# Patient Record
Sex: Male | Born: 1940 | Race: White | Hispanic: No | Marital: Married | Smoking: Former smoker
Health system: Southern US, Community
[De-identification: ages and names within clinical notes are randomized; demographics above are authoritative.]

## PROBLEM LIST (undated history)

## (undated) DIAGNOSIS — I10 Essential (primary) hypertension: Secondary | ICD-10-CM

## (undated) DIAGNOSIS — Z8619 Personal history of other infectious and parasitic diseases: Secondary | ICD-10-CM

## (undated) DIAGNOSIS — J449 Chronic obstructive pulmonary disease, unspecified: Secondary | ICD-10-CM

## (undated) DIAGNOSIS — J189 Pneumonia, unspecified organism: Secondary | ICD-10-CM

## (undated) DIAGNOSIS — E039 Hypothyroidism, unspecified: Secondary | ICD-10-CM

## (undated) DIAGNOSIS — M1711 Unilateral primary osteoarthritis, right knee: Secondary | ICD-10-CM

## (undated) DIAGNOSIS — J439 Emphysema, unspecified: Secondary | ICD-10-CM

## (undated) DIAGNOSIS — K759 Inflammatory liver disease, unspecified: Secondary | ICD-10-CM

## (undated) DIAGNOSIS — N419 Inflammatory disease of prostate, unspecified: Secondary | ICD-10-CM

## (undated) DIAGNOSIS — T7840XA Allergy, unspecified, initial encounter: Secondary | ICD-10-CM

## (undated) DIAGNOSIS — R55 Syncope and collapse: Secondary | ICD-10-CM

## (undated) DIAGNOSIS — I444 Left anterior fascicular block: Secondary | ICD-10-CM

## (undated) DIAGNOSIS — H43819 Vitreous degeneration, unspecified eye: Secondary | ICD-10-CM

## (undated) DIAGNOSIS — G250 Essential tremor: Secondary | ICD-10-CM

## (undated) DIAGNOSIS — F419 Anxiety disorder, unspecified: Secondary | ICD-10-CM

## (undated) DIAGNOSIS — H269 Unspecified cataract: Secondary | ICD-10-CM

## (undated) HISTORY — DX: Inflammatory disease of prostate, unspecified: N41.9

## (undated) HISTORY — DX: Unspecified cataract: H26.9

## (undated) HISTORY — DX: Allergy, unspecified, initial encounter: T78.40XA

## (undated) HISTORY — DX: Left anterior fascicular block: I44.4

## (undated) HISTORY — DX: Personal history of other infectious and parasitic diseases: Z86.19

## (undated) HISTORY — DX: Hypothyroidism, unspecified: E03.9

## (undated) HISTORY — DX: Unilateral primary osteoarthritis, right knee: M17.11

## (undated) HISTORY — DX: Chronic obstructive pulmonary disease, unspecified: J44.9

## (undated) HISTORY — PX: OTHER SURGICAL HISTORY: SHX169

## (undated) HISTORY — PX: COLONOSCOPY: SHX174

## (undated) HISTORY — DX: Syncope and collapse: R55

## (undated) HISTORY — DX: Emphysema, unspecified: J43.9

## (undated) HISTORY — DX: Vitreous degeneration, unspecified eye: H43.819

---

## 1965-06-25 DIAGNOSIS — Z8619 Personal history of other infectious and parasitic diseases: Secondary | ICD-10-CM

## 1965-06-25 HISTORY — DX: Personal history of other infectious and parasitic diseases: Z86.19

## 1998-05-10 ENCOUNTER — Ambulatory Visit (HOSPITAL_BASED_OUTPATIENT_CLINIC_OR_DEPARTMENT_OTHER): Admission: RE | Admit: 1998-05-10 | Discharge: 1998-05-10 | Payer: Self-pay | Admitting: Orthopedic Surgery

## 1998-09-15 ENCOUNTER — Encounter: Payer: Self-pay | Admitting: Internal Medicine

## 1999-08-11 ENCOUNTER — Encounter: Admission: RE | Admit: 1999-08-11 | Discharge: 1999-08-11 | Payer: Self-pay | Admitting: *Deleted

## 1999-08-11 ENCOUNTER — Encounter: Payer: Self-pay | Admitting: *Deleted

## 2003-06-26 HISTORY — PX: INGUINAL HERNIA REPAIR: SHX194

## 2003-11-12 ENCOUNTER — Encounter: Admission: RE | Admit: 2003-11-12 | Discharge: 2003-11-12 | Payer: Self-pay | Admitting: Surgery

## 2003-11-12 ENCOUNTER — Encounter: Payer: Self-pay | Admitting: Internal Medicine

## 2003-11-16 ENCOUNTER — Ambulatory Visit (HOSPITAL_BASED_OUTPATIENT_CLINIC_OR_DEPARTMENT_OTHER): Admission: RE | Admit: 2003-11-16 | Discharge: 2003-11-16 | Payer: Self-pay | Admitting: Surgery

## 2003-11-16 ENCOUNTER — Encounter (INDEPENDENT_AMBULATORY_CARE_PROVIDER_SITE_OTHER): Payer: Self-pay | Admitting: *Deleted

## 2003-11-16 ENCOUNTER — Ambulatory Visit (HOSPITAL_COMMUNITY): Admission: RE | Admit: 2003-11-16 | Discharge: 2003-11-16 | Payer: Self-pay | Admitting: Surgery

## 2004-06-02 ENCOUNTER — Ambulatory Visit (HOSPITAL_COMMUNITY): Admission: RE | Admit: 2004-06-02 | Discharge: 2004-06-02 | Payer: Self-pay | Admitting: Surgery

## 2004-06-02 ENCOUNTER — Ambulatory Visit (HOSPITAL_BASED_OUTPATIENT_CLINIC_OR_DEPARTMENT_OTHER): Admission: RE | Admit: 2004-06-02 | Discharge: 2004-06-02 | Payer: Self-pay | Admitting: Surgery

## 2004-11-09 ENCOUNTER — Ambulatory Visit: Payer: Self-pay | Admitting: Gastroenterology

## 2004-11-30 ENCOUNTER — Ambulatory Visit: Payer: Self-pay | Admitting: Gastroenterology

## 2006-06-25 DIAGNOSIS — I444 Left anterior fascicular block: Secondary | ICD-10-CM

## 2006-06-25 HISTORY — DX: Left anterior fascicular block: I44.4

## 2007-07-23 ENCOUNTER — Ambulatory Visit: Payer: Self-pay | Admitting: Internal Medicine

## 2007-07-23 DIAGNOSIS — R9389 Abnormal findings on diagnostic imaging of other specified body structures: Secondary | ICD-10-CM | POA: Insufficient documentation

## 2007-07-23 DIAGNOSIS — I4510 Unspecified right bundle-branch block: Secondary | ICD-10-CM

## 2007-07-23 DIAGNOSIS — I7300 Raynaud's syndrome without gangrene: Secondary | ICD-10-CM | POA: Insufficient documentation

## 2007-07-23 DIAGNOSIS — R930 Abnormal findings on diagnostic imaging of skull and head, not elsewhere classified: Secondary | ICD-10-CM

## 2007-07-23 DIAGNOSIS — M1990 Unspecified osteoarthritis, unspecified site: Secondary | ICD-10-CM | POA: Insufficient documentation

## 2007-07-23 DIAGNOSIS — R918 Other nonspecific abnormal finding of lung field: Secondary | ICD-10-CM | POA: Insufficient documentation

## 2007-07-23 DIAGNOSIS — R062 Wheezing: Secondary | ICD-10-CM

## 2007-07-24 ENCOUNTER — Ambulatory Visit: Payer: Self-pay | Admitting: Internal Medicine

## 2007-07-24 DIAGNOSIS — E039 Hypothyroidism, unspecified: Secondary | ICD-10-CM | POA: Insufficient documentation

## 2007-07-28 ENCOUNTER — Ambulatory Visit: Payer: Self-pay | Admitting: Internal Medicine

## 2007-08-01 ENCOUNTER — Telehealth: Payer: Self-pay | Admitting: Internal Medicine

## 2007-08-04 ENCOUNTER — Ambulatory Visit: Payer: Self-pay | Admitting: Internal Medicine

## 2007-08-04 DIAGNOSIS — J449 Chronic obstructive pulmonary disease, unspecified: Secondary | ICD-10-CM | POA: Insufficient documentation

## 2007-08-04 DIAGNOSIS — N281 Cyst of kidney, acquired: Secondary | ICD-10-CM | POA: Insufficient documentation

## 2007-08-04 LAB — CONVERTED CEMR LAB
ALT: 22 units/L (ref 0–53)
AST: 25 units/L (ref 0–37)
Albumin: 4.2 g/dL (ref 3.5–5.2)
Alkaline Phosphatase: 73 units/L (ref 39–117)
BUN: 15 mg/dL (ref 6–23)
Bacteria, UA: NEGATIVE
Basophils Absolute: 0 10*3/uL (ref 0.0–0.1)
Basophils Relative: 0.3 % (ref 0.0–1.0)
Bilirubin Urine: NEGATIVE
Bilirubin, Direct: 0.2 mg/dL (ref 0.0–0.3)
CO2: 31 meq/L (ref 19–32)
Calcium: 9.7 mg/dL (ref 8.4–10.5)
Chloride: 102 meq/L (ref 96–112)
Cholesterol: 131 mg/dL (ref 0–200)
Creatinine, Ser: 1 mg/dL (ref 0.4–1.5)
Crystals: NEGATIVE
Eosinophils Absolute: 0.3 10*3/uL (ref 0.0–0.6)
Eosinophils Relative: 3.4 % (ref 0.0–5.0)
GFR calc Af Amer: 96 mL/min
GFR calc non Af Amer: 80 mL/min
Glucose, Bld: 98 mg/dL (ref 70–99)
HCT: 41.5 % (ref 39.0–52.0)
HCV Ab: NEGATIVE
HDL: 49.4 mg/dL (ref >39.0)
Hemoglobin: 13.8 g/dL (ref 13.0–17.0)
Hep B Core Total Ab: POSITIVE — AB
Hep B S Ab: POSITIVE — AB
Hepatitis B Surface Ag: NEGATIVE
Ketones, ur: NEGATIVE mg/dL
LDL Cholesterol: 69 mg/dL (ref 0–99)
Leukocytes, UA: NEGATIVE
Lymphocytes Relative: 15.4 % (ref 12.0–46.0)
MCHC: 33.3 g/dL (ref 30.0–36.0)
MCV: 93.2 fL (ref 78.0–100.0)
Monocytes Absolute: 0.7 10*3/uL (ref 0.2–0.7)
Monocytes Relative: 7.1 % (ref 3.0–11.0)
Mucus, UA: NEGATIVE
Neutro Abs: 7.5 10*3/uL (ref 1.4–7.7)
Neutrophils Relative %: 73.8 % (ref 43.0–77.0)
Nitrite: NEGATIVE
PSA: 1.29 ng/mL (ref 0.10–4.00)
Platelets: 252 10*3/uL (ref 150–400)
Potassium: 3.7 meq/L (ref 3.5–5.1)
RBC: 4.45 M/uL (ref 4.22–5.81)
RDW: 12.1 % (ref 11.5–14.6)
Sodium: 138 meq/L (ref 135–145)
Specific Gravity, Urine: <=1.005 (ref 1.000–1.03)
Squamous Epithelial / LPF: NEGATIVE /lpf
TSH: 8.59 microintl units/mL — ABNORMAL HIGH (ref 0.35–5.50)
Total Bilirubin: 1.2 mg/dL (ref 0.3–1.2)
Total CHOL/HDL Ratio: 2.7
Total Protein, Urine: NEGATIVE mg/dL
Total Protein: 7.2 g/dL (ref 6.0–8.3)
Triglycerides: 63 mg/dL (ref 0–149)
Urine Glucose: NEGATIVE mg/dL
Urobilinogen, UA: 0.2 (ref 0.0–1.0)
VLDL: 13 mg/dL (ref 0–40)
WBC, UA: NONE SEEN cells/hpf
WBC: 10.1 10*3/uL (ref 4.5–10.5)
pH: 6.5 (ref 5.0–8.0)

## 2007-08-06 ENCOUNTER — Encounter: Payer: Self-pay | Admitting: Internal Medicine

## 2007-08-06 ENCOUNTER — Encounter (INDEPENDENT_AMBULATORY_CARE_PROVIDER_SITE_OTHER): Payer: Self-pay | Admitting: *Deleted

## 2007-09-04 ENCOUNTER — Telehealth: Payer: Self-pay | Admitting: Internal Medicine

## 2007-09-04 ENCOUNTER — Ambulatory Visit: Payer: Self-pay | Admitting: Internal Medicine

## 2007-09-05 ENCOUNTER — Encounter: Admission: RE | Admit: 2007-09-05 | Discharge: 2007-09-05 | Payer: Self-pay | Admitting: Internal Medicine

## 2007-09-16 ENCOUNTER — Ambulatory Visit: Payer: Self-pay | Admitting: Internal Medicine

## 2007-09-25 ENCOUNTER — Ambulatory Visit: Payer: Self-pay | Admitting: Internal Medicine

## 2007-10-02 ENCOUNTER — Ambulatory Visit: Payer: Self-pay | Admitting: Internal Medicine

## 2007-10-02 DIAGNOSIS — R634 Abnormal weight loss: Secondary | ICD-10-CM | POA: Insufficient documentation

## 2007-10-02 DIAGNOSIS — F17200 Nicotine dependence, unspecified, uncomplicated: Secondary | ICD-10-CM

## 2007-11-12 ENCOUNTER — Ambulatory Visit: Payer: Self-pay | Admitting: Internal Medicine

## 2007-12-02 ENCOUNTER — Telehealth: Payer: Self-pay | Admitting: Internal Medicine

## 2007-12-05 ENCOUNTER — Ambulatory Visit: Payer: Self-pay | Admitting: Internal Medicine

## 2007-12-09 ENCOUNTER — Ambulatory Visit: Payer: Self-pay | Admitting: Cardiology

## 2007-12-16 ENCOUNTER — Telehealth (INDEPENDENT_AMBULATORY_CARE_PROVIDER_SITE_OTHER): Payer: Self-pay | Admitting: *Deleted

## 2008-01-12 ENCOUNTER — Ambulatory Visit: Payer: Self-pay | Admitting: Internal Medicine

## 2008-02-03 ENCOUNTER — Ambulatory Visit: Payer: Self-pay | Admitting: Internal Medicine

## 2008-02-03 DIAGNOSIS — J019 Acute sinusitis, unspecified: Secondary | ICD-10-CM | POA: Insufficient documentation

## 2008-03-17 ENCOUNTER — Telehealth: Payer: Self-pay | Admitting: Internal Medicine

## 2008-04-07 ENCOUNTER — Ambulatory Visit: Payer: Self-pay | Admitting: Internal Medicine

## 2008-04-07 LAB — CONVERTED CEMR LAB
INR: 1 (ref 0.8–1.0)
Prothrombin Time: 11.8 s (ref 10.9–13.3)
aPTT: 31.9 s — ABNORMAL HIGH (ref 21.7–29.8)

## 2008-04-13 ENCOUNTER — Telehealth (INDEPENDENT_AMBULATORY_CARE_PROVIDER_SITE_OTHER): Payer: Self-pay | Admitting: *Deleted

## 2008-04-14 ENCOUNTER — Ambulatory Visit: Payer: Self-pay | Admitting: Internal Medicine

## 2008-04-14 ENCOUNTER — Encounter: Payer: Self-pay | Admitting: Internal Medicine

## 2008-04-14 ENCOUNTER — Ambulatory Visit: Admission: RE | Admit: 2008-04-14 | Discharge: 2008-04-14 | Payer: Self-pay | Admitting: Internal Medicine

## 2008-04-14 HISTORY — PX: BRONCHOSCOPY: SUR163

## 2008-04-15 ENCOUNTER — Telehealth (INDEPENDENT_AMBULATORY_CARE_PROVIDER_SITE_OTHER): Payer: Self-pay | Admitting: *Deleted

## 2008-05-06 ENCOUNTER — Ambulatory Visit: Payer: Self-pay | Admitting: Internal Medicine

## 2008-05-06 ENCOUNTER — Telehealth (INDEPENDENT_AMBULATORY_CARE_PROVIDER_SITE_OTHER): Payer: Self-pay | Admitting: *Deleted

## 2008-05-07 ENCOUNTER — Ambulatory Visit: Payer: Self-pay | Admitting: Internal Medicine

## 2008-06-29 ENCOUNTER — Encounter: Payer: Self-pay | Admitting: Internal Medicine

## 2008-07-05 ENCOUNTER — Ambulatory Visit: Payer: Self-pay | Admitting: Internal Medicine

## 2008-07-06 LAB — CONVERTED CEMR LAB
BUN: 21 mg/dL (ref 6–23)
Basophils Absolute: 0 10*3/uL (ref 0.0–0.1)
Basophils Relative: 0.1 % (ref 0.0–3.0)
CO2: 27 meq/L (ref 19–32)
Chloride: 100 meq/L (ref 96–112)
Creatinine, Ser: 0.9 mg/dL (ref 0.4–1.5)
Eosinophils Relative: 7 % — ABNORMAL HIGH (ref 0.0–5.0)
Glucose, Bld: 144 mg/dL — ABNORMAL HIGH (ref 70–99)
Hemoglobin: 13.6 g/dL (ref 13.0–17.0)
Lymphocytes Relative: 19 % (ref 12.0–46.0)
Monocytes Relative: 5.5 % (ref 3.0–12.0)
Neutro Abs: 5.3 10*3/uL (ref 1.4–7.7)
Neutrophils Relative %: 68.4 % (ref 43.0–77.0)
RBC: 4.2 M/uL — ABNORMAL LOW (ref 4.22–5.81)
TSH: 5.12 microintl units/mL (ref 0.35–5.50)
WBC: 7.7 10*3/uL (ref 4.5–10.5)

## 2008-07-13 ENCOUNTER — Ambulatory Visit: Payer: Self-pay | Admitting: Internal Medicine

## 2008-07-13 DIAGNOSIS — J3089 Other allergic rhinitis: Secondary | ICD-10-CM

## 2008-07-13 DIAGNOSIS — J302 Other seasonal allergic rhinitis: Secondary | ICD-10-CM

## 2008-07-13 DIAGNOSIS — R7309 Other abnormal glucose: Secondary | ICD-10-CM | POA: Insufficient documentation

## 2008-08-03 ENCOUNTER — Ambulatory Visit: Payer: Self-pay | Admitting: Internal Medicine

## 2008-11-02 ENCOUNTER — Ambulatory Visit: Payer: Self-pay | Admitting: Internal Medicine

## 2008-11-02 LAB — CONVERTED CEMR LAB
CO2: 32 meq/L (ref 19–32)
Chloride: 106 meq/L (ref 96–112)
Creatinine, Ser: 0.8 mg/dL (ref 0.4–1.5)
Glucose, Bld: 100 mg/dL — ABNORMAL HIGH (ref 70–99)
Hgb A1c MFr Bld: 5.8 % (ref 4.6–6.5)

## 2008-11-09 ENCOUNTER — Ambulatory Visit: Payer: Self-pay | Admitting: Internal Medicine

## 2009-01-31 ENCOUNTER — Ambulatory Visit: Payer: Self-pay | Admitting: Internal Medicine

## 2009-02-07 ENCOUNTER — Ambulatory Visit: Payer: Self-pay | Admitting: Internal Medicine

## 2009-02-07 DIAGNOSIS — M79609 Pain in unspecified limb: Secondary | ICD-10-CM | POA: Insufficient documentation

## 2009-02-07 DIAGNOSIS — H539 Unspecified visual disturbance: Secondary | ICD-10-CM

## 2009-02-07 DIAGNOSIS — G250 Essential tremor: Secondary | ICD-10-CM | POA: Insufficient documentation

## 2009-02-07 DIAGNOSIS — G252 Other specified forms of tremor: Secondary | ICD-10-CM

## 2009-04-18 ENCOUNTER — Ambulatory Visit: Payer: Self-pay | Admitting: Internal Medicine

## 2009-05-02 ENCOUNTER — Ambulatory Visit: Payer: Self-pay | Admitting: Internal Medicine

## 2009-05-02 LAB — CONVERTED CEMR LAB
CO2: 31 meq/L (ref 19–32)
Calcium: 9.8 mg/dL (ref 8.4–10.5)
GFR calc non Af Amer: 89.39 mL/min (ref >60)
Potassium: 4.4 meq/L (ref 3.5–5.1)
Sodium: 142 meq/L (ref 135–145)
Vitamin B-12: 452 pg/mL (ref 211–911)

## 2009-05-09 ENCOUNTER — Ambulatory Visit: Payer: Self-pay | Admitting: Internal Medicine

## 2009-06-24 ENCOUNTER — Ambulatory Visit: Payer: Self-pay | Admitting: Family Medicine

## 2009-06-24 DIAGNOSIS — J029 Acute pharyngitis, unspecified: Secondary | ICD-10-CM | POA: Insufficient documentation

## 2009-06-25 DIAGNOSIS — H43819 Vitreous degeneration, unspecified eye: Secondary | ICD-10-CM

## 2009-06-25 HISTORY — DX: Vitreous degeneration, unspecified eye: H43.819

## 2009-08-01 ENCOUNTER — Ambulatory Visit: Payer: Self-pay | Admitting: Internal Medicine

## 2009-08-12 ENCOUNTER — Telehealth: Payer: Self-pay | Admitting: Internal Medicine

## 2009-10-25 ENCOUNTER — Ambulatory Visit: Payer: Self-pay | Admitting: Internal Medicine

## 2009-10-25 LAB — CONVERTED CEMR LAB
Bilirubin Urine: NEGATIVE
Bilirubin, Direct: 0.2 mg/dL (ref 0.0–0.3)
Cholesterol: 124 mg/dL (ref 0–200)
Eosinophils Absolute: 0.3 10*3/uL (ref 0.0–0.7)
GFR calc non Af Amer: 102.25 mL/min (ref >60)
Glucose, Bld: 95 mg/dL (ref 70–99)
HDL: 41.7 mg/dL (ref >39.00)
Leukocytes, UA: NEGATIVE
MCHC: 34 g/dL (ref 30.0–36.0)
MCV: 94.3 fL (ref 78.0–100.0)
Monocytes Absolute: 0.4 10*3/uL (ref 0.1–1.0)
Neutrophils Relative %: 59.6 % (ref 43.0–77.0)
Nitrite: NEGATIVE
PSA: 1.44 ng/mL (ref 0.10–4.00)
Platelets: 238 10*3/uL (ref 150.0–400.0)
Potassium: 3.9 meq/L (ref 3.5–5.1)
Sodium: 139 meq/L (ref 135–145)
Specific Gravity, Urine: 1.025 (ref 1.000–1.030)
Total Bilirubin: 0.9 mg/dL (ref 0.3–1.2)
Total Protein: 6.7 g/dL (ref 6.0–8.3)
VLDL: 9.4 mg/dL (ref 0.0–40.0)
pH: 6 (ref 5.0–8.0)

## 2009-10-31 ENCOUNTER — Ambulatory Visit: Payer: Self-pay | Admitting: Internal Medicine

## 2009-11-25 ENCOUNTER — Encounter (INDEPENDENT_AMBULATORY_CARE_PROVIDER_SITE_OTHER): Payer: Self-pay | Admitting: *Deleted

## 2010-01-10 ENCOUNTER — Ambulatory Visit: Payer: Self-pay | Admitting: Internal Medicine

## 2010-01-30 ENCOUNTER — Ambulatory Visit: Payer: Self-pay | Admitting: Internal Medicine

## 2010-01-30 DIAGNOSIS — R918 Other nonspecific abnormal finding of lung field: Secondary | ICD-10-CM | POA: Insufficient documentation

## 2010-01-30 DIAGNOSIS — R911 Solitary pulmonary nodule: Secondary | ICD-10-CM

## 2010-04-04 ENCOUNTER — Ambulatory Visit: Payer: Self-pay | Admitting: Internal Medicine

## 2010-04-28 ENCOUNTER — Ambulatory Visit: Payer: Self-pay | Admitting: Internal Medicine

## 2010-04-28 ENCOUNTER — Encounter (INDEPENDENT_AMBULATORY_CARE_PROVIDER_SITE_OTHER): Payer: Self-pay | Admitting: *Deleted

## 2010-04-28 LAB — CONVERTED CEMR LAB
CO2: 28 meq/L (ref 19–32)
Calcium: 9.5 mg/dL (ref 8.4–10.5)
Chloride: 104 meq/L (ref 96–112)
Glucose, Bld: 82 mg/dL (ref 70–99)
Sodium: 138 meq/L (ref 135–145)

## 2010-05-05 ENCOUNTER — Ambulatory Visit: Payer: Self-pay | Admitting: Internal Medicine

## 2010-05-05 DIAGNOSIS — L570 Actinic keratosis: Secondary | ICD-10-CM

## 2010-05-09 ENCOUNTER — Encounter (INDEPENDENT_AMBULATORY_CARE_PROVIDER_SITE_OTHER): Payer: Self-pay | Admitting: *Deleted

## 2010-05-11 ENCOUNTER — Ambulatory Visit: Payer: Self-pay | Admitting: Internal Medicine

## 2010-05-30 ENCOUNTER — Ambulatory Visit: Payer: Self-pay | Admitting: Internal Medicine

## 2010-07-27 NOTE — Progress Notes (Signed)
Summary: results  Phone Note Call from Patient Call back at Home Phone [phone]   Caller: Patient Call For: young Reason for Call: Talk to Nurse Summary of Call: returning call to [facility] for results Initial call taken by: Eugene Gavia,  August 12, 2009 3:40 PM  Follow-up for Phone Call        pt advised per append. Carron Curie CMA  August 12, 2009 3:44 PM

## 2010-07-27 NOTE — Letter (Signed)
Summary: David Buck Instructions  David Buck  [address] [place], [place] [address]   Phone: [phone]  Fax: [phone]       David Buck    Sep 05, 1940    MRN: [id number]        Procedure Day /Date:  Tuesday 05/30/2010     Arrival Time: 2:00 pm      Procedure Time: 3:00 pm     Location of Procedure:                    _x _  David Buck (4th Floor)                        PREPARATION FOR COLONOSCOPY WITH MOVIPREP   Starting 5 days prior to your procedure Thursday 12/1 do not eat nuts, seeds, popcorn, corn, beans, peas,  salads, or any raw vegetables.  Do not take any fiber supplements (e.g. Metamucil, Citrucel, and Benefiber).  THE DAY BEFORE YOUR PROCEDURE         DATE: Monday 12/5  1.  Drink clear liquids the entire day-NO SOLID FOOD  2.  Do not drink anything colored red or purple.  Avoid juices with pulp.  No orange juice.  3.  Drink at least 64 oz. (8 glasses) of fluid/clear liquids during the day to prevent dehydration and help the prep work efficiently.  CLEAR LIQUIDS INCLUDE: Water Jello Ice Popsicles Tea (sugar ok, no milk/cream) Powdered fruit flavored drinks Coffee (sugar ok, no milk/cream) Gatorade Juice: apple, white grape, white cranberry  Lemonade Clear bullion, consomm, broth Carbonated beverages (any kind) Strained chicken noodle soup Hard Candy                             4.  In the morning, mix first dose of MoviPrep solution:    Empty 1 Pouch A and 1 Pouch B into the disposable container    Add lukewarm drinking water to the top line of the container. Mix to dissolve    Refrigerate (mixed solution should be used within 24 hrs)  5.  Begin drinking the prep at 5:00 p.m. The MoviPrep container is divided by 4 marks.   Every 15 minutes drink the solution down to the next mark (approximately 8 oz) until the full liter is complete.   6.  Follow completed prep with 16 oz of clear liquid of your choice  (Nothing red or purple).  Continue to drink clear liquids until bedtime.  7.  Before going to bed, mix second dose of MoviPrep solution:    Empty 1 Pouch A and 1 Pouch B into the disposable container    Add lukewarm drinking water to the top line of the container. Mix to dissolve    Refrigerate  THE DAY OF YOUR PROCEDURE      DATE: Tuesday 12/6  Beginning at 10:00 a.m. (5 hours before procedure):         1. Every 15 minutes, drink the solution down to the next mark (approx 8 oz) until the full liter is complete.  2. Follow completed prep with 16 oz. of clear liquid of your choice.    3. You may drink clear liquids until 1:00 pm (2 HOURS BEFORE PROCEDURE).   MEDICATION INSTRUCTIONS  Unless otherwise instructed, you should take regular prescription medications with a small sip of water   as early as possible the morning of  your procedure.           OTHER INSTRUCTIONS  You will need a responsible adult at least 69 years of age to accompany you and drive you home.   This person must remain in the waiting room during your procedure.  Wear loose fitting clothing that is easily removed.  Leave jewelry and other valuables at home.  However, you may wish to bring a book to read or  an iPod/MP3 player to listen to music as you wait for your procedure to start.  Remove all body piercing jewelry and leave at home.  Total time from sign-in until discharge is approximately 2-3 hours.  You should go home directly after your procedure and rest.  You can resume normal activities the  day after your procedure.  The day of your procedure you should not:   Drive   Make legal decisions   Operate machinery   Drink alcohol   Return to work  You will receive specific instructions about eating, activities and medications before you leave.    The above instructions have been reviewed and explained to me by   David Buck, RN______________________    I fully understand  and can verbalize these instructions _____________________________ Date _________

## 2010-07-27 NOTE — Assessment & Plan Note (Signed)
Summary: rov 6 months///kp   Copy to:  [name] Primary Provider/Referring Provider:  [name]  CC:  6 Month Follow up visit-breathing good; no complaints..  History of Present Illness: 04/14/08- bronchoscopy- benign cytopath, negative for AFB, Yeast.  05/06/08- Wife here. We reviewed bronch results in detail. Benign, with cultures negative. We again reviewed CT from 12/09/07, entered below. Has had pneumovax and mild SAR. Had pneumovax, H1N1 and was too restless to sleep well. We discussed smoking.  01/31/09- Tobacco, RUL nodular scarring.................................Marland Kitchenwife here Now gets by with nasal saline rinse and once daily use of flonase.  Denies cough, wheeze, night sweats, chest pain, fever or acute chest symptoms. Wife says he snores.Denies daytime sleepiness but admits poor sleep habits. asleep for 20-60 minutes in fron of TV the bed at 2AM. Going to [place] in September. CXR- 2 small RUL cavitary nodules. One lookked cavistary a year ago, the other does now. Thee are too small  to sample but should be watched.  August 01, 2009- Tobacco, RUL nodular scarring................................Marland Kitchenwife here CXR last August suggest 2 nodular areas in RUL might be shelling out/ cavitiating. Has felt well, off flonase and loratadine months ago, but in last few days nose has been running. Chest has felt well without weight loss, night sweats, productive cough or fever, nodes or chest pain. Smoking 1 PPD.  January 30, 2010- Tobacco, RUL nodular scarring............................Marland Kitchenwife here CXR in February showed no change since 01/2009 in nodular densities most c/w scarring.  He feels "fine". Some eustachian dysfunction and nasal stuffiness. Denies fever, purulent or headache. Goes to [facility] and they gave him nicotine patch- he hasn't mustered himself yet to try it but is given encouragement.   Preventive Screening-Counseling & Management  Alcohol-Tobacco     Smoking Status:  current     Smoking Cessation Counseling: yes     Packs/Day: 1pkpd     Tobacco Counseling: to quit use of tobacco products  Current Medications (verified): 1)  Synthroid 75 Mcg Tabs (Levothyroxine Sodium) .Marland Kitchen.. 1 By Mouth Qd 2)  Vitamin D3 1000 Unit  Tabs (Cholecalciferol) .Marland Kitchen.. 1 Qd 3)  Aspir-Low 81 Mg Tbec (Aspirin) .Marland Kitchen.. 1 Once Daily After Meal 4)  Multivitamins   Tabs (Multiple Vitamin) .... Take 1 By Mouth Once Daily 5)  Vitamin C 500 Mg  Tabs (Ascorbic Acid) .... Take 1 By Mouth Once Daily 6)  Glucosamine Sulfate 1000 Mg  Caps (Glucosamine Sulfate) .... Once Daily 7)  Flonase 50 Mcg/act Susp (Fluticasone Propionate) .Marland Kitchen.. 1 Spray in Each Nostril Once Daily As Needed 8)  Loratadine 10 Mg Tabs (Loratadine) .... Once Daily As Needed  Allergies (verified): 1)  ! 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 Dr Maple Hudson Allergic rhinitis  Past Surgical History: Last updated: 05/06/2008 Inguinal herniorrhaphy B '05   Dr Burtis Junes menisc tear repair Bronchoscopy 04/14/08 benign  Family History: Last updated: 12/05/2007 Family History Hypertension Father  died with cerebral hemorrage 54 mother died age 8 hx of cancer  ovarian/bowel 2 siblings 1 sister alive age 37  no medical hx 1 sibling alive age 24   hx of MS  Social History: Last updated: 12/05/2007 Retired Married 2 children etoh  social  1-2 drinks daily Current Smoker 1/2 ppd  Risk Factors: Smoking Status: current (01/30/2010) Packs/Day: 1pkpd (01/30/2010)  Review of Systems  See HPI       The patient complains of nasal congestion/difficulty breathing through nose.  The patient denies shortness of breath with activity, shortness of breath at rest, productive cough, non-productive cough, coughing up blood,  chest pain, irregular heartbeats, acid heartburn, indigestion, loss of appetite, weight change, abdominal pain, difficulty swallowing, sore throat, tooth/dental problems, headaches, and sneezing.    Vital Signs:  Patient profile:   70 year old male Height:      74.5 inches Weight:      178.25 pounds BMI:     22.66 O2 Sat:      96 % on Room air Pulse rate:   70 / minute BP sitting:   106 / 64  (left arm) Cuff size:   regular  Vitals Entered By: Reynaldo Minium CMA (January 30, 2010 2:28 PM)  O2 Flow:  Room air CC: 6 Month Follow up visit-breathing good; no complaints.   Physical Exam  Additional Exam:  General: A/Ox3; pleasant and cooperative, NAD, tall, comfortable appearing. SKIN: no rash, lesions NODES: no lymphadenopathy HEENT: [place]/AT, EOM- WNL, Conjuctivae- clear, PERRLA, TM-WNL, Nose- clear, Throat- clear and wnl Mallampati II, no post nasal drip. NECK: Supple w/ fair ROM, JVD- none, normal carotid impulses w/o bruits Thyroid-  CHEST: Clear to P&A, No crackles heard HEART: RRR, no m/g/r heard ABDOMEN: Soft and nl;  ION:GEXB, nl pulses, no edema  NEURO: Grossly intact to observation      CXR  Procedure date:  08/01/2009  Findings:      DG CHEST 2 VIEW - [id number]   Clinical Data: Follow-up abnormal chest x-ray.   CHEST - 2 VIEW   Comparison: 01/31/2009   Findings: Reticular nodular densities are seen in the right upper lobe in the area of previously questioned cavitary lesions.  I suspect this represents post inflammatory changes/scarring without well-defined nodule or cavitary lesion.   Mild hyperinflation of the lungs.  Heart is normal size.  Lungs otherwise clear.  No effusions.   IMPRESSION: Reticular nodular densities in the right upper lobe without well- defined cavitary lesion.  I suspect this represents scarring.   Hyperinflation.   Read By:  Charlett Nose,  M.D.     Released By:  Charlett Nose,  M.D.   Impression & Recommendations:  Problem #  1:  TOBACCO USE DISORDER/SMOKER-SMOKING CESSATION DISCUSSED (ICD-305.1)  With wife here, I again reinforced efforts to get him to try to quit. He does have the patches.  Problem # 2:  ALLERGIC RHINITIS (ICD-477.9)  I think there is an irritiant rhinitis with eustachian dysfunction from sumkmer air. Doubt an infection. He will  try saline lavage and a decongestant, with antihistamine as needed. We compared these meds. His updated medication list for this problem includes:    Flonase 50 Mcg/act Susp (Fluticasone propionate) .Marland Kitchen... 1 spray in each nostril once daily as needed    Loratadine 10 Mg Tabs (Loratadine) ..... 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 We will watch oever time. We will recheck CXR. He wanted to discuss radiation exposure and was satisfied. Orders: Est. Patient Level IV (01027) T-2 View CXR (71020TC)  Patient Instructions: 1)  Please schedule a follow-up appointment in 1 year. 2)  A chest x-ray has been recommended.  Your imaging study may require preauthorization.  3)  Try Sudafed-PE (otc) or Sudafed as a decongestant when your head feels stopped up. You can take it by itself or with an  antihistamine. Drink enough water to avoid getting dehydrated.     CXR  Procedure date:  08/01/2009  Findings:      DG CHEST 2 VIEW - [id number]   Clinical Data: Follow-up abnormal chest x-ray.   CHEST - 2 VIEW   Comparison: 01/31/2009   Findings: Reticular nodular densities are seen in the right upper lobe in the area of previously questioned cavitary lesions.  I suspect this represents post inflammatory changes/scarring without well-defined nodule or cavitary lesion.   Mild hyperinflation of the lungs.  Heart is normal size.  Lungs otherwise clear.  No effusions.   IMPRESSION: Reticular nodular densities in the right upper lobe without well- defined cavitary lesion.  I suspect this represents scarring.     Hyperinflation.   Read By:  Charlett Nose,  M.D.     Released By:  Charlett Nose,  M.D.

## 2010-07-27 NOTE — Assessment & Plan Note (Signed)
Summary: 6 MO ROV /NWS  #   Vital Signs:  Patient profile:   70 year old male Height:      74.5 inches Weight:      177.75 pounds BMI:     22.60 O2 Sat:      97 % on Room air Temp:     98.2 degrees F oral Pulse rate:   71 / minute BP sitting:   112 / 68  (left arm) Cuff size:   regular  Vitals Entered By: Lucious Groves (Oct 31, 2009 2:02 PM)  O2 Flow:  Room air CC: 6 mo rtn ov./kb Is Patient Diabetic? No Pain Assessment Patient in pain? no      Comments Patient notes that his shoulder was not fully healed after PT, so he temporarily placed on Mobic. Med list updated./kb   Primary Care Provider:  Beatrice Sehgal  CC:  6 mo rtn ov./kb.  History of Present Illness: The patient presents for a wellness examination  Diet: Heart healthy  Physical activity - active.   Depression/mood screen: Negative.  Hearing: Intact  bilateral.  Visual Acuity: Grossly normal w/glasses.  ADL's: capable. Fall risk: None.  Home safety: good.  End of LifePlanning/Advanced directive - Full code. I agree. Discussed the need for AD.   Current Medications (verified): 1)  Synthroid 75 Mcg Tabs (Levothyroxine Sodium) .Marland Kitchen.. 1 By Mouth Qd 2)  Vitamin D3 1000 Unit  Tabs (Cholecalciferol) .Marland Kitchen.. 1 Qd 3)  Aspir-Low 81 Mg Tbec (Aspirin) .Marland Kitchen.. 1 Once Daily After Meal 4)  Multivitamins   Tabs (Multiple Vitamin) .... Take 1 By Mouth Once Daily 5)  Vitamin C 500 Mg  Tabs (Ascorbic Acid) .... Take 1 By Mouth Once Daily 6)  Glucosamine Sulfate 1000 Mg  Caps (Glucosamine Sulfate) .... Once Daily 7)  Flonase 50 Mcg/act Susp (Fluticasone Propionate) .Marland Kitchen.. 1 Spray in Each Nostril Once Daily As Needed 8)  Loratadine 10 Mg Tabs (Loratadine) .... Once Daily As Needed 9)  Mobic 15 Mg Tabs (Meloxicam) .Marland Kitchen.. 1 By Mouth Once Daily X1 Week Then As Needed.  Allergies (verified): 1)  ! 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 Dr Maple Hudson Allergic rhinitis  Past Surgical History: Last updated: 05/06/2008 Inguinal herniorrhaphy B '05   Dr Burtis Junes menisc tear repair Bronchoscopy 04/14/08 benign  Family History: Last updated: 12/05/2007 Family History Hypertension Father  died with cerebral hemorrage 23 mother died age 50 hx of cancer  ovarian/bowel 2 siblings 1 sister alive age 36  no medical hx 1 sibling alive age 72   hx of MS  Social History: Last updated: 12/05/2007 Retired Married 2 children etoh  social  1-2 drinks daily Current Smoker 1/2 ppd  Review of Systems  The patient denies anorexia, fever, weight loss, weight gain, vision loss, decreased hearing, hoarseness, chest pain, syncope, dyspnea on exertion, peripheral edema, prolonged cough, headaches, hemoptysis, abdominal pain, melena, hematochezia, severe indigestion/heartburn, hematuria, incontinence, genital sores, muscle weakness, suspicious skin lesions, transient blindness, difficulty walking, depression, unusual weight change, abnormal bleeding, enlarged lymph nodes, angioedema, and testicular masses.    Physical Exam  General:  Well-developed,well-nourished,in no acute distress; alert,appropriate and cooperative throughout examination Head:  Normocephalic and atraumatic without obvious abnormalities. No apparent alopecia or balding. Eyes:  No corneal or conjunctival inflammation noted. EOMI. Perrla.  Ears:  External ear exam  shows no significant lesions or deformities.  Otoscopic examination reveals clear canals, tympanic membranes are intact bilaterally without bulging, retraction, inflammation or discharge. Hearing is grossly normal bilaterally. Nose:  External nasal examination shows no deformity or inflammation. Nasal mucosa are pink and moist without lesions or exudates. Mouth:  Oral mucosa and oropharynx without lesions or  exudates.  Teeth in good repair.No peritonsillar abscess and no masses. Neck:  No deformities, masses, or tenderness noted.no adenopathy noted Lungs:  Normal respiratory effort, chest expands symmetrically. Lungs are clear to auscultation, no crackles or wheezes. Heart:  Normal rate and regular rhythm. S1 and S2 normal without gallop, murmur, click, rub or other extra sounds. Abdomen:  Bowel sounds positive,abdomen soft and non-tender without masses, organomegaly or hernias noted. Msk:  No deformity or scoliosis noted of thoracic or lumbar spine.   Extremities:  No clubbing, cyanosis, edema, or deformity noted with normal full range of motion of all joints.   Neurologic:  No cranial nerve deficits noted. Station and gait are normal. Plantar reflexes are down-going bilaterally. DTRs are symmetrical throughout. Sensory, motor and coordinative functions appear intact. Skin:  Intact without suspicious lesions or rashes Cervical Nodes:  No lymphadenopathy noted Inguinal Nodes:  No significant adenopathy Psych:  Cognition and judgment appear intact. Alert and cooperative with normal attention span and concentration. No apparent delusions, illusions, hallucinations   Impression & Recommendations:  Problem # 1:  WELL ADULT EXAM (ICD-V70.0) Assessment New Overall doing well, age appropriate education and counseling updated and referral for appropriate preventive services done unless declined, immunizations up to date or declined, diet counseling done if overweight, urged to quit smoking if smokes, most recent labs reviewed and current ordered if appropriate, ecg reviewed or declined (interpretation per ECG scanned in the EMR if done); information regarding Medicare Preventation requirements given if appropriate.  Orders: EKG w/ Interpretation (93000) The labs were reviewed with the patient.   Problem # 2:  ABNORMAL GLUCOSE NEC (ICD-790.29) Assessment: Comment Only  Problem # 3:  HYPOTHYROIDISM  (ICD-244.9) Assessment: Unchanged  His updated medication list for this problem includes:    Synthroid 75 Mcg Tabs (Levothyroxine sodium) .Marland Kitchen... 1 by mouth qd  Problem # 4:  ALLERGIC RHINITIS (ICD-477.9) Assessment: Unchanged  His updated medication list for this problem includes:    Flonase 50 Mcg/act Susp (Fluticasone propionate) .Marland Kitchen... 1 spray in each nostril once daily as needed    Loratadine 10 Mg Tabs (Loratadine) ..... Once daily as needed  Problem # 5:  TOBACCO USE DISORDER/SMOKER-SMOKING CESSATION DISCUSSED (ICD-305.1) Assessment: Unchanged  Encouraged smoking cessation and discussed different methods for smoking cessation.   Problem # 6:  CHEST XRAY, ABNORMAL (ICD-793.1) Assessment: Unchanged Pulm f/u w/Dr Maple Hudson  Complete Medication List: 1)  Synthroid 75 Mcg Tabs (Levothyroxine sodium) .Marland Kitchen.. 1 by mouth qd 2)  Vitamin D3 1000 Unit Tabs (Cholecalciferol) .Marland Kitchen.. 1 qd 3)  Aspir-low 81 Mg Tbec (Aspirin) .Marland Kitchen.. 1 once daily after meal 4)  Multivitamins Tabs (Multiple vitamin) .... Take 1 by mouth once daily 5)  Vitamin C 500 Mg Tabs (Ascorbic acid) .... Take 1 by mouth once daily 6)  Glucosamine Sulfate 1000 Mg Caps (Glucosamine sulfate) .... Once daily 7)  Flonase 50 Mcg/act Susp (Fluticasone propionate) .Marland Kitchen.. 1 spray in each nostril once daily as needed 8)  Loratadine 10 Mg Tabs (Loratadine) .... Once daily as needed 9)  Mobic 15 Mg Tabs (Meloxicam) .Marland Kitchen.. 1 by mouth once daily x1 week then as needed.  Patient Instructions: 1)  Please  schedule a follow-up appointment in 6 months. 2)  BMP prior to visit, ICD-9: 3)  TSH prior to visit, ICD-9:244.8

## 2010-07-27 NOTE — Letter (Signed)
Summary: Pre Visit Letter Revised  [facility]  [address] [place], [place] [address]   Phone: [phone]  Fax: [phone]        04/28/2010 MRN: [id number] David Buck [address] [place], [place]  [address]             Procedure Date:  05/30/2010   Welcome to the Gastroenterology Division at [facility].    You are scheduled to see a nurse for your pre-procedure visit on 05/11/2010 at 2:30pm on the 3rd floor at [facility], [address]  We ask that you try to arrive at our office 15 minutes prior to your appointment time to allow for check-in.  Please take a minute to review the attached form.  If you answer "Yes" to one or more of the questions on the first page, we ask that you call the person listed at your earliest opportunity.  If you answer "No" to all of the questions, please complete the rest of the form and bring it to your appointment.    Your nurse visit will consist of discussing your medical and surgical history, your immediate family medical history, and your medications.   If you are unable to list all of your medications on the form, please bring the medication bottles to your appointment and we will list them.  We will need to be aware of both prescribed and over the counter drugs.  We will need to know exact dosage information as well.    Please be prepared to read and sign documents such as consent forms, a financial agreement, and acknowledgement forms.  If necessary, and with your consent, a friend or relative is welcome to sit-in on the nurse visit with you.  Please bring your insurance card so that we may make a copy of it.  If your insurance requires a referral to see a specialist, please bring your referral form from your primary care physician.  No co-pay is required for this nurse visit.     If you cannot keep your appointment, please call [phone] to cancel or reschedule prior to your appointment date.  This  allows [place] the opportunity to schedule an appointment for another patient in need of care.    Thank you for choosing [facility] for your medical needs.  We appreciate the opportunity to care for you.  Please visit [place] at our website  to learn more about our practice.  Sincerely, The Gastroenterology Division

## 2010-07-27 NOTE — Miscellaneous (Signed)
Summary: REC COL...AS.  Clinical Lists Changes  Medications: Added new medication of MOVIPREP 100 GM  SOLR (PEG-KCL-NACL-NASULF-NA ASC-C) As per prep instructions. - Signed Rx of MOVIPREP 100 GM  SOLR (PEG-KCL-NACL-NASULF-NA ASC-C) As per prep instructions.;  #1 x 0;  Signed;  Entered by: [name] RN;  Authorized by: Iva Boop MD, Clementeen Graham;  Method used: Electronically to [facility] [address] [place], [place], [place]  [address], Ph: [phone] or [phone], Fax: [phone] Observations: Added new observation of ALLERGY REV: Done (05/11/2010 14:24)    Prescriptions: MOVIPREP 100 GM  SOLR (PEG-KCL-NACL-NASULF-NA ASC-C) As per prep instructions.  #1 x 0   Entered by:   [name] RN   Authorized by:   Iva Boop MD, [facility]   Signed by:   [name] RN on 05/11/2010   Method used:   Electronically to        [facility] (retail)       [address]       [place], [place]  [address]       Ph: [phone] or [phone]       Fax: [phone]   RxID:   [id number]

## 2010-07-27 NOTE — Assessment & Plan Note (Signed)
Summary: f/u 6 months///kp   Copy to:  [name] Primary Provider/Referring Provider:  [name]  CC:  6 month follow up visit-no complaints..  History of Present Illness:  04/14/08- bronchoscopy- benign cytopath, negative for AFB, Yeast.  05/06/08- Wife here. We reviewed bronch results in detail. Benign, with cultures negative. We again reviewed CT from 12/09/07, entered below. Has had pneumovax and mild SAR. Had pneumovax, H1N1 and was too restless to sleep well. We discussed smoking.  01/31/09- Tobacco, RUL nodular scarring.................................Marland Kitchenwife here Now gets by with nasal saline rinse and once daily use of flonase.  Denies cough, wheeze, night sweats, chest pain, fever or acute chest symptoms. Wife says he snores.Denies daytime sleepiness but admits poor sleep habits. asleep for 20-60 minutes in fron of TV the bed at 2AM. Going to [place] in September. CXR- 2 small RUL cavitary nodules. One lookked cavistary a year ago, the other does now. Thee are too small  to sample but should be watched.  August 01, 2009- Tobacco, RUL nodular scarring................................Marland Kitchenwife here CXR last August suggest 2 nodular areas in RUL might be shelling out/ cavitiating. Has felt well, off flonase and loratadine months ago, but in last few days nose has been running. Chest has felt well without weight loss, night sweats, productive cough or fever, nodes or chest pain. Smoking 1 PPD.   Current Medications (verified): 1)  Synthroid 75 Mcg Tabs (Levothyroxine Sodium) .Marland Kitchen.. 1 By Mouth Qd 2)  Vitamin D3 1000 Unit  Tabs (Cholecalciferol) .Marland Kitchen.. 1 Qd 3)  Aspir-Low 81 Mg Tbec (Aspirin) .Marland Kitchen.. 1 Once Daily After Meal 4)  Multivitamins   Tabs (Multiple Vitamin) .... Take 1 By Mouth Once Daily 5)  Vitamin C 500 Mg  Tabs (Ascorbic Acid) .... Take 1 By Mouth Once Daily 6)  Glucosamine Sulfate 1000 Mg  Caps (Glucosamine Sulfate) .... Once Daily 7)  Flonase 50 Mcg/act Susp (Fluticasone  Propionate) .Marland Kitchen.. 1 Spray in Each Nostril Once Daily As Needed 8)  Loratadine 10 Mg Tabs (Loratadine) .... Once Daily As Needed  Allergies (verified): 1)  ! 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 Dr Maple Hudson Allergic rhinitis  Past Surgical History: Last updated: 05/06/2008 Inguinal herniorrhaphy B '05   Dr Burtis Junes menisc tear repair Bronchoscopy 04/14/08 benign  Family History: Last updated: 12/05/2007 Family History Hypertension Father  died with cerebral hemorrage 22 mother died age 53 hx of cancer  ovarian/bowel 2 siblings 1 sister alive age 26  no medical hx 1 sibling alive age 91   hx of MS  Social History: Last updated: 12/05/2007 Retired Married 2 children etoh  social  1-2 drinks daily Current Smoker 1/2 ppd  Risk Factors: Smoking Status: current (05/09/2009) Packs/Day: 1pkpd (07/23/2007)  Review of Systems      See HPI  The patient denies anorexia, fever, weight loss, weight gain, vision loss, decreased hearing, hoarseness, chest pain, syncope, dyspnea on exertion, peripheral edema, prolonged cough, headaches, hemoptysis, abdominal pain, and severe indigestion/heartburn.    Vital Signs:  Patient profile:   70 year old male Height:      74.5 inches Weight:      184.13 pounds BMI:     23.41 O2 Sat:      94 % on Room air Pulse rate:   67 / minute BP sitting:   110 / 68  (left  arm) Cuff size:   regular  Vitals Entered By: Reynaldo Minium CMA (August 01, 2009 1:58 PM)  O2 Flow:  Room air  Physical Exam  Additional Exam:  General: A/Ox3; pleasant and cooperative, NAD, tall, comfortable appearing. SKIN: no rash, lesions NODES: no lymphadenopathy HEENT: /AT, EOM- WNL, Conjuctivae- clear, PERRLA, TM-WNL, Nose- clear, Throat- clear and wnl  Melampatti II, no post nasal drip. NECK: Supple w/ fair ROM, JVD- none, normal carotid impulses w/o bruits Thyroid-  CHEST: Clear to P&A, No crackles heard HEART: RRR, no m/g/r heard ABDOMEN: Soft and nl;  ZOX:WRUE, nl pulses, no edema  NEURO: Grossly intact to observation      Impression & Recommendations:  Problem # 1:  CHEST XRAY, ABNORMAL (ICD-793.1)  2 nodules being tracked RUL with suggestion of cavitation. Previous bronchoscopy was negative. We will update CXR. He remains asymptomatic.  Problem # 2:  TOBACCO USE DISORDER/SMOKER-SMOKING CESSATION DISCUSSED (ICD-305.1)  We talked again about cessation. His wife supports our effort. I think he is interseted in trying a nicotine gum.  Orders: Est. Patient Level III (45409)  Other Orders: T-2 View CXR (71020TC)  Patient Instructions: 1)  Please schedule a follow-up appointment in 6 months. 2)  A chest x-ray has been recommended.  Your imaging study may require preauthorization.  3)  Please keep trying to stop smoking. It will be worth the effort.

## 2010-07-27 NOTE — Assessment & Plan Note (Signed)
Summary: DR AVP PT/NO SLOT--FEVER-EARACHE-FLU LIKE SYMP-STC   Vital Signs:  Patient profile:   70 year old male Height:      74.5 inches (189.23 cm) Weight:      178.2 pounds (81.00 kg) O2 Sat:      97 % on Room air Temp:     98.3 degrees F (36.83 degrees C) oral Pulse rate:   81 / minute BP sitting:   110 / 72  (left arm) Cuff size:   regular  Vitals Entered By: Orlan Leavens (January 10, 2010 1:18 PM)  O2 Flow:  Room air CC: flu like symptoms/ fever x's 3 days, URI symptoms Is Patient Diabetic? No Pain Assessment Patient in pain? no        Primary Care Provider:  [name]  CC:  flu like symptoms/ fever x's 3 days and URI symptoms.  History of Present Illness:  URI Symptoms      This is a 70 year old man who presents with URI symptoms.  The symptoms began 4 days ago.  The severity is described as moderate.  ?flu - began as aches and GI upset, fevr 2 days ago but none since - now R ear pain.  The patient reports nasal congestion, sore throat, and earache, but denies clear nasal discharge, purulent nasal discharge, dry cough, productive cough, and sick contacts.  Associated symptoms include fever of 100.5-103 degrees, diarrhea, use of an antipyretic, and response to antipyretic.  The patient denies stiff neck, dyspnea, wheezing, rash, and vomiting.  The patient also reports headache and muscle aches.  The patient denies itchy throat, sneezing, seasonal symptoms, and response to antihistamine.  The patient denies the following risk factors for Strep sinusitis: unilateral facial pain, double sickening, tooth pain, Strep exposure, and tender adenopathy.    Current Medications (verified): 1)  Synthroid 75 Mcg Tabs (Levothyroxine Sodium) .Marland Kitchen.. 1 By Mouth Qd 2)  Vitamin D3 1000 Unit  Tabs (Cholecalciferol) .Marland Kitchen.. 1 Qd 3)  Aspir-Low 81 Mg Tbec (Aspirin) .Marland Kitchen.. 1 Once Daily After Meal 4)  Multivitamins   Tabs (Multiple Vitamin) .... Take 1 By Mouth Once Daily 5)  Vitamin C 500 Mg  Tabs (Ascorbic  Acid) .... Take 1 By Mouth Once Daily 6)  Glucosamine Sulfate 1000 Mg  Caps (Glucosamine Sulfate) .... Once Daily 7)  Flonase 50 Mcg/act Susp (Fluticasone Propionate) .Marland Kitchen.. 1 Spray in Each Nostril Once Daily As Needed 8)  Loratadine 10 Mg Tabs (Loratadine) .... Once Daily As Needed  Allergies (verified): 1)  ! 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 MAC Dr Maple Hudson Allergic rhinitis  Review of Systems  The patient denies anorexia, hoarseness, chest pain, headaches, and hemoptysis.    Physical Exam  General:  alert, well-developed, well-nourished, and cooperative to examination.    Eyes:  vision grossly intact; pupils equal, round and reactive to light.  conjunctiva and lids normal.    Ears:  normal pinnae bilaterally, without erythema, swelling, or tenderness to palpation. R TM clear,  L TM hazy but without erythema or effusion, or cerumen impaction. Hearing grossly normal bilaterally  Mouth:  teeth and gums in good repair; mucous membranes moist, without lesions or ulcers. oropharynx clear without exudate, mild-mod erythema.  Lungs:  normal respiratory effort, no intercostal retractions or use of accessory muscles; normal breath sounds bilaterally - no crackles and no wheezes.  Heart:  Normal rate and regular rhythm. S1 and S2 normal without gallop, murmur, click, rub or other extra sounds.   Impression & Recommendations:  Problem # 1:  SINUSITIS, ACUTE (ICD-461.9)  resume nasal steroid (to help with eusatian dysfx symptoms) abx and antihistamine - tylenol/ibuprofen as needed  His updated medication list for this problem includes:    Flonase 50 Mcg/act Susp (Fluticasone propionate) .Marland Kitchen... 1 spray in each nostril once daily as needed    Azithromycin 250 Mg Tabs (Azithromycin) .Marland Kitchen... 2 tabs by mouth today, then  1 by mouth daily starting tomorrow  Instructed on treatment. Call if symptoms persist or worsen.   Orders: Prescription Created Electronically [phone])  Problem # 2:  ALLERGIC RHINITIS (ICD-477.9)  His updated medication list for this problem includes:    Flonase 50 Mcg/act Susp (Fluticasone propionate) .Marland Kitchen... 1 spray in each nostril once daily as needed    Loratadine 10 Mg Tabs (Loratadine) ..... Once daily as needed  Complete Medication List: 1)  Synthroid 75 Mcg Tabs (Levothyroxine sodium) .Marland Kitchen.. 1 by mouth qd 2)  Vitamin D3 1000 Unit Tabs (Cholecalciferol) .Marland Kitchen.. 1 qd 3)  Aspir-low 81 Mg Tbec (Aspirin) .Marland Kitchen.. 1 once daily after meal 4)  Multivitamins Tabs (Multiple vitamin) .... Take 1 by mouth once daily 5)  Vitamin C 500 Mg Tabs (Ascorbic acid) .... Take 1 by mouth once daily 6)  Glucosamine Sulfate 1000 Mg Caps (Glucosamine sulfate) .... Once daily 7)  Flonase 50 Mcg/act Susp (Fluticasone propionate) .Marland Kitchen.. 1 spray in each nostril once daily as needed 8)  Loratadine 10 Mg Tabs (Loratadine) .... Once daily as needed 9)  Azithromycin 250 Mg Tabs (Azithromycin) .... 2 tabs by mouth today, then 1 by mouth daily starting tomorrow  Patient Instructions: 1)  it was good to see you today. 2)  resume nasal steroid (flonase) as discussed and continue claritin for allergy symptoms - 3)  Zpack antibiotics as discussed - your prescription has been electronically submitted to your pharmacy. Please take as directed. Contact our office if you believe you're having problems with the medication(s).  4)  Get plenty of rest, drink lots of clear liquids, and use Tylenol or Ibuprofen for fever and comfort. Return in 7-10 days if you're not better:sooner if you're feeling worse. Prescriptions: AZITHROMYCIN 250 MG TABS (AZITHROMYCIN) 2 tabs by mouth today, then 1 by mouth daily starting tomorrow  #6 x 0   Entered and Authorized by:   Newt Lukes MD   Signed by:   Newt Lukes MD on 01/10/2010    Method used:   Electronically to        [facility] (retail)       [address]       [place], [place]  [address]       Ph: [phone] or [phone]       Fax: [phone]   RxID:   [id number]

## 2010-07-27 NOTE — Assessment & Plan Note (Signed)
Summary: 6 mos f/u // #/cd   Vital Signs:  Patient profile:   70 year old male Height:      74.5 inches Weight:      176 pounds BMI:     22.38 Temp:     98.4 degrees F oral Pulse rate:   88 / minute Pulse rhythm:   regular Resp:     16 per minute BP sitting:   120 / 74  (left arm) Cuff size:   regular  Vitals Entered By: Lanier Prude, Beverly Gust) (May 05, 2010 1:37 PM) CC: 6 mo f/u  Comments pt was recently diagnosed with posterior vitreous detachment 2 wks ago and is being followed by Dr. Allyne Gee    Primary Care Damyah Gugel:  [name]  CC:  6 mo f/u .  History of Present Illness: The patient presents for a follow up of hypertension, tremor, hyperlipidemia  C/o spot on L forehead  Current Medications (verified): 1)  Synthroid 75 Mcg Tabs (Levothyroxine Sodium) .Marland Kitchen.. 1 By Mouth Qd 2)  Vitamin D3 1000 Unit  Tabs (Cholecalciferol) .Marland Kitchen.. 1 Qd 3)  Aspir-Low 81 Mg Tbec (Aspirin) .Marland Kitchen.. 1 Once Daily After Meal 4)  Multivitamins   Tabs (Multiple Vitamin) .... Take 1 By Mouth Once Daily 5)  Vitamin C 500 Mg  Tabs (Ascorbic Acid) .... Take 1 By Mouth Once Daily 6)  Glucosamine Sulfate 1000 Mg  Caps (Glucosamine Sulfate) .... Once Daily 7)  Flonase 50 Mcg/act Susp (Fluticasone Propionate) .Marland Kitchen.. 1 Spray in Each Nostril Once Daily As Needed 8)  Loratadine 10 Mg Tabs (Loratadine) .... Once Daily As Needed  Allergies (verified): 1)  ! 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    Physical Exam  General:  alert,  well-developed, well-nourished, and cooperative to examination.    Head:  Normocephalic and atraumatic without obvious abnormalities. No apparent alopecia or balding. Nose:  External nasal examination shows no deformity or inflammation. Nasal mucosa are pink and moist without lesions or exudates. Mouth:  teeth and gums in good repair; mucous membranes moist, without lesions or ulcers. oropharynx clear without exudate, mild-mod erythema.  Lungs:  normal respiratory effort, no intercostal retractions or use of accessory muscles; normal breath sounds bilaterally - no crackles and no wheezes.    Heart:  Normal rate and regular rhythm. S1 and S2 normal without gallop, murmur, click, rub or other extra sounds. Abdomen:  Bowel sounds positive,abdomen soft and non-tender without masses, organomegaly or hernias noted. Msk:  No deformity or scoliosis noted of thoracic or lumbar spine.   Neurologic:  No cranial nerve deficits noted. Station and gait are normal. Plantar reflexes are down-going bilaterally. DTRs are symmetrical throughout. Sensory, motor and coordinative functions appear intact. Skin:  AK on L forehead Psych:  Cognition and judgment appear intact. Alert and cooperative with normal attention span and concentration. No apparent delusions, illusions, hallucinations   Impression & Recommendations:  Problem # 1:  VISION DISORDER (ICD-368.9) Assessment Improved S/p eval.  Problem # 2:  TREMOR, ESSENTIAL (ICD-333.1) resolved Assessment: Improved  Problem # 3:  HYPOTHYROIDISM (ICD-244.9) Assessment: Unchanged  His updated medication list for this problem includes:    Synthroid 75 Mcg Tabs (Levothyroxine sodium) .Marland Kitchen... 1 by mouth qd  Problem # 4:  COPD (ICD-496) Assessment: Unchanged  Problem # 5:  HYPOTHYROIDISM, UNSPECIFIED (ICD-244.9) Assessment: New The labs were reviewed with the patient.  His updated medication list for this problem includes:    Synthroid 75 Mcg Tabs (Levothyroxine  sodium) .Marland Kitchen... 1 by mouth qd  Problem # 6:  OSTEOARTHRITIS (ICD-715.90) Assessment: Unchanged  His updated medication list for this problem includes:    Aspir-low 81 Mg Tbec (Aspirin) .Marland Kitchen... 1 once daily after meal  Problem # 7:  TOBACCO USE DISORDER/SMOKER-SMOKING CESSATION DISCUSSED (ICD-305.1) Assessment: Unchanged  Encouraged smoking cessation and discussed different methods for smoking cessation.   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 scar(s), incomplete removal, ect.  and benefits discussed    1  lesion(s) on L forehead was/were treated with liqid N2 in usual fasion.  Tolerated well. Compl. none. Wound care instructions given.   Orders: Cryotherapy/Destruction benign or premalignant lesion (1st lesion)  (17000)  Complete Medication List: 1)  Synthroid 75 Mcg Tabs (Levothyroxine sodium) .Marland Kitchen.. 1 by mouth qd 2)  Vitamin D3 1000 Unit Tabs (Cholecalciferol) .Marland Kitchen.. 1 qd 3)  Aspir-low 81 Mg Tbec (Aspirin) .Marland Kitchen.. 1 once daily after meal 4)  Multivitamins Tabs (Multiple vitamin) .... Take 1 by mouth once daily 5)  Vitamin C 500 Mg Tabs (Ascorbic acid) .... Take 1 by mouth once daily 6)  Glucosamine Sulfate 1000 Mg Caps (Glucosamine sulfate) .... Once daily 7)  Flonase 50 Mcg/act Susp (Fluticasone propionate) .Marland Kitchen.. 1 spray in each nostril once daily as needed 8)  Loratadine 10 Mg Tabs (Loratadine) .... Once daily as needed  Patient Instructions: 1)  Please schedule a follow-up appointment in 6 months well w/labs.   Orders Added: 1)  Est. Patient Level IV [40981] 2)  Cryotherapy/Destruction benign or premalignant lesion (1st lesion)  [17000]

## 2010-07-27 NOTE — Letter (Signed)
Summary: Colonoscopy Letter  [facility]  [address] [place], [place] [address]   Phone: [phone]  Fax: [phone]      November 25, 2009 MRN: [id number]   David Buck [address] [place], [place]  [address]   Dear [name],   According to your medical record, it is time for you to schedule a Colonoscopy. The American Cancer Society recommends this procedure as a method to detect early colon cancer. Patients with a family history of colon cancer, or a personal history of colon polyps or inflammatory bowel disease are at increased risk.  This letter has been generated based on the recommendations made at the time of your procedure. If you feel that in your particular situation this may no longer apply, please contact our office.  Please call our office at [phone] to schedule this appointment or to update your records at your earliest convenience.  Thank you for cooperating with [place] to provide you with the very best care possible.   Sincerely,  Judie Petit T. Russella Dar, M.D.  [facility] [phone]

## 2010-07-27 NOTE — Assessment & Plan Note (Signed)
Summary: flu shot/cd  Nurse Visit   Vitals Entered By: Brenton Grills MA (April 04, 2010 1:54 PM)  Allergies: 1)  ! Cefdinir (Cefdinir)  Orders Added: 1)  Flu Vaccine 79yrs + MEDICARE PATIENTS [Q2039] 2)  Administration Flu vaccine - MCR [G0008]   Flu Vaccine Consent Questions     Do you have a history of severe allergic reactions to this vaccine? no    Any prior history of allergic reactions to egg and/or gelatin? no    Do you have a sensitivity to the preservative Thimersol? no    Do you have a past history of Guillan-Barre Syndrome? no    Do you currently have an acute febrile illness? no    Have you ever had a severe reaction to latex? no    Vaccine information given and explained to patient? yes    Are you currently pregnant? no    Lot Number:[id number]   Exp Date:12/23/2010   Site Given  Left Deltoid IM

## 2010-07-27 NOTE — Procedures (Signed)
Summary: Colonoscopy  Patient: David Buck Note: All result statuses are Final unless otherwise noted.  Tests: (1) Colonoscopy (COL)   COL Colonoscopy           DONE     [facility]     [address]     [place], [place]  [address]           COLONOSCOPY PROCEDURE REPORT           PATIENT:  [name], [name]  MR#:  [id number]     BIRTHDATE:  [birth year], 69 yrs. old  GENDER:  male     ENDOSCOPIST:  Iva Boop, MD, [facility]           PROCEDURE DATE:  05/30/2010     PROCEDURE:  Higher-risk screening colonoscopy G0105           ASA CLASS:  Class II     INDICATIONS:  surveillance and high-risk screening 3 mm adenoma     removed 2006, no polyps 2001.     mother had probable ovarian cancer with colon involved and died     in late 84's     MEDICATIONS:   Fentanyl 50 mcg IV, Versed 5 mg IV           DESCRIPTION OF PROCEDURE:   After the risks benefits and     alternatives of the procedure were thoroughly explained, informed     consent was obtained.  Digital rectal exam was performed and     revealed no abnormalities and normal prostate.   The LB 180AL     [id number] endoscope was introduced through the anus and advanced to     the cecum, which was identified by both the appendix and ileocecal     valve, without limitations.  The quality of the prep was     excellent, using MoviPrep.  The instrument was then slowly     withdrawn as the colon was fully examined. Insertion: 3:54     minutes, Withdrawal; 11:15 minutes     <<PROCEDUREIMAGES>>           FINDINGS:  Mild diverticulosis was found throughout the colon.     This was otherwise a normal examination of the colon. including     right colon retroflexion.   Retroflexed views in the rectum     revealed internal hemorrhoids.    The scope was then withdrawn     from the patient and the procedure completed.           COMPLICATIONS:  None     ENDOSCOPIC IMPRESSION:     1) Mild diverticulosis throughout the colon     2)  Otherwise normal examination of colon with excellent prep     3) Internal hemorrhoids           REPEAT EXAM:  In 10 year(s) for routine screening colonoscopy.     (diminutive adenoma x 1 (2006), 2 colonoscopies no polyps(2001 and     today)- risk profile is average risk           Iva Boop, MD, [facility]           CC:  The Patient           n.     eSIGNED:   Iva Boop at 05/30/2010 03:46 PM           Judithann Sauger, [phone]  Note: An exclamation mark (!) indicates a result that was not  dispersed into the flowsheet. Document Creation Date: 05/30/2010 3:46 PM _______________________________________________________________________  (1) Order result status: Final Collection or observation date-time: 05/30/2010 15:36 Requested date-time:  Receipt date-time:  Reported date-time:  Referring Physician:   Ordering Physician: Stan Head [phone]) Specimen Source:  Source: Launa Grill Order Number: [id number] Lab site:   Appended Document: Colonoscopy    Clinical Lists Changes  Observations: Added new observation of COLONNXTDUE: 05/2020 (05/30/2010 15:46)

## 2010-09-26 ENCOUNTER — Other Ambulatory Visit: Payer: Self-pay

## 2010-10-26 ENCOUNTER — Other Ambulatory Visit (INDEPENDENT_AMBULATORY_CARE_PROVIDER_SITE_OTHER): Payer: Medicare Other

## 2010-10-26 DIAGNOSIS — Z0000 Encounter for general adult medical examination without abnormal findings: Secondary | ICD-10-CM

## 2010-10-26 DIAGNOSIS — R7309 Other abnormal glucose: Secondary | ICD-10-CM

## 2010-10-26 DIAGNOSIS — Z1503 Genetic susceptibility to malignant neoplasm of prostate: Secondary | ICD-10-CM

## 2010-10-26 DIAGNOSIS — E039 Hypothyroidism, unspecified: Secondary | ICD-10-CM

## 2010-10-26 DIAGNOSIS — E785 Hyperlipidemia, unspecified: Secondary | ICD-10-CM

## 2010-10-26 DIAGNOSIS — Z0389 Encounter for observation for other suspected diseases and conditions ruled out: Secondary | ICD-10-CM

## 2010-10-26 DIAGNOSIS — R935 Abnormal findings on diagnostic imaging of other abdominal regions, including retroperitoneum: Secondary | ICD-10-CM

## 2010-10-26 LAB — CBC WITH DIFFERENTIAL/PLATELET
Basophils Absolute: 0 10*3/uL (ref 0.0–0.1)
HCT: 40.8 % (ref 39.0–52.0)
Lymphs Abs: 1.6 10*3/uL (ref 0.7–4.0)
MCV: 95.2 fl (ref 78.0–100.0)
Monocytes Absolute: 0.5 10*3/uL (ref 0.1–1.0)
Platelets: 242 10*3/uL (ref 150.0–400.0)
RDW: 13.1 % (ref 11.5–14.6)

## 2010-10-26 LAB — URINALYSIS, ROUTINE W REFLEX MICROSCOPIC
Nitrite: NEGATIVE
Specific Gravity, Urine: 1.02 (ref 1.000–1.030)
Urine Glucose: NEGATIVE
Urobilinogen, UA: 0.2 (ref 0.0–1.0)

## 2010-10-26 LAB — BASIC METABOLIC PANEL
Calcium: 9.4 mg/dL (ref 8.4–10.5)
GFR: 96.37 mL/min (ref >60.00)
Sodium: 137 mEq/L (ref 135–145)

## 2010-10-26 LAB — HEPATIC FUNCTION PANEL
AST: 20 U/L (ref 0–37)
Alkaline Phosphatase: 63 U/L (ref 39–117)
Total Bilirubin: 0.5 mg/dL (ref 0.3–1.2)

## 2010-10-26 LAB — LIPID PANEL
HDL: 45.6 mg/dL (ref >39.00)
Triglycerides: 49 mg/dL (ref 0.0–149.0)

## 2010-10-26 LAB — TSH: TSH: 4.17 u[IU]/mL (ref 0.35–5.50)

## 2010-10-26 LAB — PSA: PSA: 1.28 ng/mL (ref 0.10–4.00)

## 2010-11-01 ENCOUNTER — Encounter: Payer: Self-pay | Admitting: Internal Medicine

## 2010-11-03 ENCOUNTER — Ambulatory Visit (INDEPENDENT_AMBULATORY_CARE_PROVIDER_SITE_OTHER): Payer: Medicare Other | Admitting: Internal Medicine

## 2010-11-03 ENCOUNTER — Encounter: Payer: Self-pay | Admitting: Internal Medicine

## 2010-11-03 DIAGNOSIS — J984 Other disorders of lung: Secondary | ICD-10-CM

## 2010-11-03 DIAGNOSIS — E039 Hypothyroidism, unspecified: Secondary | ICD-10-CM

## 2010-11-03 DIAGNOSIS — Z0000 Encounter for general adult medical examination without abnormal findings: Secondary | ICD-10-CM

## 2010-11-03 MED ORDER — LEVOTHYROXINE SODIUM 75 MCG PO TABS: 75.0000 ug | ORAL_TABLET | Freq: Every day | ORAL | Status: DC

## 2010-11-03 NOTE — Assessment & Plan Note (Signed)
Dr Maple Hudson q 12 mo

## 2010-11-03 NOTE — Assessment & Plan Note (Signed)
We discussed age appropriate health related issues, including available/recomended screening tests and vaccinations. We discussed a need for adhering to healthy diet and exercise. Labs/EKG were reviewed/ordered. All questions were answered.   

## 2010-11-03 NOTE — Progress Notes (Signed)
Subjective:    Patient ID: David Buck, male    DOB: 1940/10/22, 70 y.o.   MRN: [id number]  HPI  The patient is here for a wellness exam. The patient has been doing well overall without major physical or psychological issues going on lately. The patient presents for a follow-up of  chronic hypothyroidism controlled with medicines   Review of Systems  Constitutional: Negative for fever, activity change, fatigue and unexpected weight change.  Eyes: Negative for redness.  Respiratory: Negative for apnea, cough, choking and shortness of breath.   Cardiovascular: Negative for leg swelling.  Hematological: Negative for adenopathy.  Psychiatric/Behavioral: Negative for dysphoric mood.       Objective:   Physical Exam  Constitutional: He is oriented to person, place, and time. He appears well-developed.  HENT:  Mouth/Throat: Oropharynx is clear and moist.  Eyes: Conjunctivae are normal. Pupils are equal, round, and reactive to light.  Neck: Normal range of motion. No JVD present. No thyromegaly present.  Cardiovascular: Normal rate, regular rhythm, normal heart sounds and intact distal pulses.  Exam reveals no gallop and no friction rub.   No murmur heard. Pulmonary/Chest: Effort normal and breath sounds normal. No respiratory distress. He has no wheezes. He has no rales. He exhibits no tenderness.  Abdominal: Soft. Bowel sounds are normal. He exhibits no distension and no mass. There is no tenderness. There is no rebound and no guarding.  Musculoskeletal: Normal range of motion. He exhibits no edema and no tenderness.  Lymphadenopathy:    He has no cervical adenopathy.  Neurological: He is alert and oriented to person, place, and time. He has normal reflexes. No cranial nerve deficit. He exhibits normal muscle tone. Coordination normal.  Skin: Skin is warm and dry. No rash noted.  Psychiatric: He has a normal mood and affect. His behavior is normal. Judgment and thought content  normal.        Lab Results  Component Value Date   WBC 6.9 10/26/2010   HGB 13.9 10/26/2010   HCT 40.8 10/26/2010   PLT 242.0 10/26/2010   CHOL 127 10/26/2010   TRIG 49.0 10/26/2010   HDL 45.60 10/26/2010   ALT 21 10/26/2010   AST 20 10/26/2010   NA 137 10/26/2010   K 4.1 10/26/2010   CL 103 10/26/2010   CREATININE 0.8 10/26/2010   BUN 21 10/26/2010   CO2 26 10/26/2010   TSH 4.17 10/26/2010   PSA 1.28 10/26/2010   INR 1.0 RATIO 04/07/2008   HGBA1C 5.8 10/26/2010     Assessment & Plan:  Well adult exam We discussed age appropriate health related issues, including available/recomended screening tests and vaccinations. We discussed a need for adhering to healthy diet and exercise. Labs/EKG were reviewed/ordered. All questions were answered.  The patient is here for annual Medicare wellness examination and management of other chronic and acute problems.   The risk factors are reflected in the social history.  The roster of all physicians providing medical care to patient - is listed in the Snapshot section of the chart.  Activities of daily living:  The patient is 100% inedpendent in all ADLs: dressing, toileting, feeding as well as independent mobility  Home safety : The patient has smoke detectors in the home. They wear seatbelts.No firearms at home ( firearms are present in the home, kept in a safe fashion). There is no violence in the home.   There is no risks for hepatitis, STDs or HIV. There is no   history  of blood transfusion. They have no travel history to infectious disease endemic areas of the world.  The patient has (has not) seen their dentist in the last six month. They have (not) seen their eye doctor in the last year. They deny (admit to) any hearing difficulty and have not had audiologic testing in the last year.  They do not  have excessive sun exposure. Discussed the need for sun protection: hats, long sleeves and use of sunscreen if there is significant sun exposure.   Diet: the importance  of a healthy diet is discussed. They do have a healthy (unhealthy-high fat/fast food) diet.  The patient has a regular exercise program: _remodeling 4-6_duration, 3 -4 per week.  The benefits of regular aerobic exercise were discussed.  Depression screen: there are no signs or vegative symptoms of depression- irritability, change in appetite, anhedonia, sadness/tearfullness.  Cognitive assessment: the patient manages all their financial and personal affairs and is actively engaged. They could relate day,date,year and events; recalled 3/3 objects at 3 minutes; performed clock-face test normally.  The following portions of the patient's history were reviewed and updated as appropriate: allergies, current medications, past family history, past medical history,  past surgical history, past social history  and problem list.  Vision, hearing, body mass index were assessed and reviewed.   During the course of the visit the patient was educated and counseled about appropriate screening and preventive services including : fall prevention , diabetes screening, nutrition counseling, colorectal cancer screening, and recommended immunizations.   LUNG NODULE Dr Maple Hudson q 12 mo  Unspecified Hypothyroidism On Rx

## 2010-11-03 NOTE — Assessment & Plan Note (Signed)
On Rx 

## 2010-11-06 ENCOUNTER — Encounter: Payer: Self-pay | Admitting: Internal Medicine

## 2010-11-07 NOTE — Op Note (Signed)
NAMEDEMETRES, PROCHNOW             ACCOUNT NO.:  [id number]   MEDICAL RECORD NO.:  [id number]          PATIENT TYPE:  AMB   LOCATION:  CARD                         FACILITY:  [facility]   PHYSICIAN:  Clinton D. Maple Hudson, MD, FCCP, FACPDATE OF BIRTH:  1940/07/14   DATE OF PROCEDURE:  04/14/2008  DATE OF DISCHARGE:                               OPERATIVE REPORT   PROCEDURE:  Bronchoscopy.   INDICATIONS FOR PROCEDURE:  A 70 year old smoker with fibronodular  density diffusely but particularly in the right upper lobe, also at  least one separately identified more discrete nodule in the right upper  lobe.  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  Additional diagnoses are not excluded.  Bronchoscopy is performed in an attempt to get culture and cytology  identification.   DESCRIPTION OF PROCEDURE:  After fully informed consent, bronchoscopy  was performed on an outpatient basis in the endoscopy suite.  Oxygen was  provided at 4 liters per minute, holding saturation over 95%.  A  cumulative dose of 6 mg of intravenous Versed was required for  additional sedation and cough control.  A fiberoptic videoscope was  advanced via the right nostril to the level of the vocal cords without  difficulty.  The cords moved normally.  There appeared to be a raised  erythematous area on the right arytenoid without erosion, nonspecific,  but deserving of follow-up visualization.  The trachea and main carina  were normal.  Sequential examination of each lobar and segmental airway  bilaterally to the fourth division level revealed patchy mild  nonspecific bronchitis and clear secretions.  With fluoroscopic  guidance, the bronchoscope was then directed into the right upper lobe  where saline lavage was performed.  This was followed in the apical and  posterior segments by brushing and then by standard transbronchial lung  biopsy.  The fluoroscope revealed an  irregular density moving with lung  parenchyma and projecting near the medial end of the right clavicle.  Bronchoscope could be steered so that brush and forcep approached but  did not exactly reach this density.  Appropriate samples were obtained  without complication and the bronchoscope was removed.  There was  trivial self-limited bleeding and no apparent complication pending chest  x-ray.   FINAL IMPRESSION:  1. Erythematous lesion on the right arytenoid deserving follow-up ENT      visualization.  2. Fibronodular infiltrative process rather diffusely, most prominent      in the right upper lobe, suggestive of atypical infection, favor      Mycobacterium avium-intracellulare.  3. Irregular nodular density in the right upper lobe medial to the      clavicle head.   PLAN:  He will be held in recovery until stable and then return home  with family to office follow-up in approximately 3 weeks.      Clinton D. Maple Hudson, MD, Tonny Bollman, FACP  Electronically Signed     CDY/MEDQ  D:  04/14/2008  T:  04/14/2008  Job:  [id number]   cc:   Georgina Quint. [name], MD  [address]  [place]  [place]  [address]   Kerry Kass, M.D. [facility]  [address]  [place]  [place] [address]

## 2010-11-10 NOTE — Op Note (Signed)
NAME:  David Buck, David Buck                       ACCOUNT NO.:  [id number]   MEDICAL RECORD NO.:  [id number]                   PATIENT TYPE:  AMB   LOCATION:  DSC                                  FACILITY:  [facility]   PHYSICIAN:  Thornton Park. Daphine Deutscher, M.D.             DATE OF BIRTH:  1940-09-15   DATE OF PROCEDURE:  11/16/2003  DATE OF DISCHARGE:                                 OPERATIVE REPORT   PREOPERATIVE DIAGNOSIS:  Left inguinal hernia.   POSTOPERATIVE DIAGNOSIS:  Left indirect inguinal hernia with dilated ring.   PROCEDURE:  Repair of left inguinal herniorrhaphy with mesh.   SURGEON:  Thornton Park. Daphine Deutscher, M.D.   ANESTHESIA:  General.   INDICATIONS FOR PROCEDURE:  The patient is a 70 year old gentleman with left  inguinal hernia.   DESCRIPTION OF PROCEDURE:  He was taken to Room #4 and given general  anesthesia.  Preoperatively, he received 1 gram of Ancef. The abdomen was  prepped widely with Betadine and draped sterilely.  I infiltrated an area  around the incision with 0.5% Marcaine.  I marked the skin and made a small  oblique incision carrying this down to the external oblique.  This was  incised along the fibers and mobilized.  A fairly prominent hernia was noted  on the left side.  I mobilized the cord and put a Penrose drain around it.  I went proximally on it and found a large sac which I dissected free from  the cord structures and then went high and excised the excess sac and I  oversewed and closed the sac remnant.  This was about an inch in diameter.  I closed it with a running horizontal mattress suture of 2-0 silk and then  tied this down.  This was then tucked up inside and then exposed the edges  of the dilated ring and approximated these with two sutures of 0 silk.  Next, I cut a piece of mesh to fit the floor, sutured at the inguinal  ligament with a running 2-0 Prolene carrying it around the inguinal cord  structures.  I sutured this medially, again reinforcing  the entire floor  with a generous piece of mesh.  Ilioinguinal nerve branch stayed out of  things superiorly.  When it was brought up around, it was tucked beneath the  external oblique and there was plenty of room for the cord structures, but  it was sutured to itself with two horizontal mattress sutures of 2-0  Prolene.   The area was irrigated.  Infiltrated again with 0.5% Marcaine.  The external  oblique was closed with running 2-0 Vicryl.  4-0 Vicryl was used in the  subcutaneous tissue and subcuticularly.  A final closure of 5-0 Vicryl was  used  in the skin subcuticularly with Benzoin and Steri-Strips.  The patient  seemed to tolerate this procedure well and he was taken to the recovery room  in satisfactory condition.  He will be given Tylox to take as needed for  pain and will be followed up in the office in about three to four weeks.                                               Thornton Park Daphine Deutscher, M.D.    MBM/MEDQ  D:  11/16/2003  T:  11/17/2003  Job:  [id number]

## 2010-11-10 NOTE — Op Note (Signed)
[name], [name]             ACCOUNT NO.:  [id number]   MEDICAL RECORD NO.:  [id number]          PATIENT TYPE:  AMB   LOCATION:  DSC                          FACILITY:  [facility]   PHYSICIAN:  Thornton Park. Daphine Deutscher, MD  DATE OF BIRTH:  Sep 11, 1940   DATE OF PROCEDURE:  06/02/2004  DATE OF DISCHARGE:                                 OPERATIVE REPORT   PREOPERATIVE DIAGNOSIS:  Right inguinal hernia, recurrent.   POSTOPERATIVE DIAGNOSIS:  Right inguinal hernia, direct recurrence.   HISTORY OF PRESENT ILLNESS:  David Buck is a 70 year old gentleman on whom  I had done a left inguinal hernia procedure on 11/16/03.  That left side  proved to be a left indirect inguinal hernia.  He had previously had a  hydrocelectomy as a child to the right groin and developed enlarging bulge.  He came back to have it repaired as it was getting bigger.   DESCRIPTION OF PROCEDURE:  David Buck was taken to room 8 and given general  anesthesia by LMA.  The area was prepped with Betadine and draped sterilely.  A small, oblique incision was made and carried down to a lot of scar tissue  which I then incised and had to free.  The previous dissection had pretty  well destroyed all of the planes and it was not clear what kind of repair  they had effected at that time, either an earlier [facility] type repair.  I  identified the cord structures exiting the external oblique and opened that  with sharp dissection, mobilized the cord with a Penrose drain.  I did not  effect the vascular supply of the cord structures and found medial  recurrence with large direct weakness.  I opened this up and freed it up  more and eventually got this thing to my liking since there was a lot of  inflammation and distortion of the anatomy.  I repaired the floor initially  just approximating transversalis fascia to close most of the defect and then  I elected to cut a piece of mesh to fit, sutured along the inguinal ligament  with running  2-0 Prolene, and medially to the internal oblique similarly,  and then cut it so that it would go around the cord structures and sutured  it to itself with a horizontal mattress suture of 2-0 Prolene.  This was  then tucked beneath the external oblique and the external oblique was closed  with 2-0 Vicryl in a running fashion.  The area was injected with Marcaine  and then closed with 4-0 Vicryl and with a running subcuticular 5-0 Vicryl  with Benzoin and Steri-Strips.  The patient seemed to tolerate the procedure  well and he was taken to the recovery room in satisfactory condition.      Matt   MBM/MEDQ  D:  06/02/2004  T:  06/03/2004  Job:  [id number]

## 2011-01-24 ENCOUNTER — Encounter: Payer: Self-pay | Admitting: Internal Medicine

## 2011-01-29 ENCOUNTER — Ambulatory Visit (INDEPENDENT_AMBULATORY_CARE_PROVIDER_SITE_OTHER): Payer: Medicare Other | Admitting: Internal Medicine

## 2011-01-29 ENCOUNTER — Ambulatory Visit (INDEPENDENT_AMBULATORY_CARE_PROVIDER_SITE_OTHER)
Admission: RE | Admit: 2011-01-29 | Discharge: 2011-01-29 | Disposition: A | Discharge status: Home / Self Care | Payer: Medicare Other | Source: Ambulatory Visit | Attending: Internal Medicine | Admitting: Internal Medicine

## 2011-01-29 ENCOUNTER — Encounter: Payer: Self-pay | Admitting: Internal Medicine

## 2011-01-29 VITALS — BP 100/66 | HR 70 | Ht 74.0 in | Wt 177.4 lb

## 2011-01-29 DIAGNOSIS — J449 Chronic obstructive pulmonary disease, unspecified: Secondary | ICD-10-CM

## 2011-01-29 DIAGNOSIS — J984 Other disorders of lung: Secondary | ICD-10-CM

## 2011-01-29 DIAGNOSIS — J309 Allergic rhinitis, unspecified: Secondary | ICD-10-CM

## 2011-01-29 NOTE — Progress Notes (Signed)
Subjective:    Patient ID: David Buck, male    DOB: 1941-03-16, 70 y.o.   MRN: [id number]  HPI 01/29/11- 70 yoM 1 PPD smoker followed for small lung nodules/ RUL nodular scarring/ benign bronchoscopy 04/14/08- neg [employer].   Wife here        Last here 01/30/2010 CXR- 01/30/10- COPD with stable scarring, NAD.  Aware of some pn drip- could take antihistamine, but doesn't bother him enough to need to. Denies productive cough, night sweats, weight loss, fever, nodes..  Some cough, but no real change in pattern  Review of Systems Constitutional:   No-   weight loss, night sweats, fevers, chills, fatigue, lassitude. HEENT:   No-   headaches, difficulty swallowing, tooth/dental problems, sore throat,                  No-   sneezing, itching, ear ache, nasal congestion, post nasal drip,   CV:  No-   chest pain, orthopnea, PND, swelling in lower extremities, anasarca, dizziness, palpitations  GI:  No-   heartburn, indigestion, abdominal pain, nausea, vomiting, diarrhea,                 change in bowel habits, loss of appetite  Resp: No-   shortness of breath with exertion or at rest.  No-  excess mucus,             No-   productive cough,  No non-productive cough,  No-  coughing up of blood.              No-   change in color of mucus.  No- wheezing.    Skin: No-   rash or lesions.  GU: No-   dysuria, change in color of urine, no urgency or frequency.  No- flank pain.  MS:  No-   joint pain or swelling.  No- decreased range of motion.  No- back pain.  Psych:  No- change in mood or affect. No depression or anxiety.  No memory loss.      Objective:   Physical Exam General- Alert, Oriented, Affect-appropriate, Distress- none acute. tall slender man Skin- rash-none, lesions- none, excoriation- none Lymphadenopathy- none Head- atraumatic            Eyes- Gross vision intact, PERRLA, conjunctivae clear secretions            Ears- Hearing, canals normal            Nose- Clear, No- Septal  dev, mucus, polyps, erosion, perforation             Throat- Mallampati II , mucosa clear , drainage- none, tonsils- atrophic  dentures Neck- flexible , trachea midline, no stridor , thyroid nl, carotid no bruit Chest - symmetrical excursion , unlabored           Heart/CV- RRR , no murmur , no gallop  , no rub, nl s1 s2                           - JVD- none , edema- none, stasis changes- none, varices- none           Lung- clear to P&A, wheeze- none, cough- none , dullness-none, rub- none           Chest wall-  Abd- tender-no, distended-no, bowel sounds-present, HSM- no Br/ Gen/ Rectal- Not done, not indicated Extrem- cyanosis- none, clubbing, none, atrophy- none, strength- nl Neuro- grossly intact  to observation         Assessment & Plan:

## 2011-01-29 NOTE — Assessment & Plan Note (Signed)
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 We will update CXR

## 2011-01-29 NOTE — Assessment & Plan Note (Signed)
Anticipate PFT next year to compare with 2009 I have again raised the smoking cessation issue- not motivated.

## 2011-01-29 NOTE — Assessment & Plan Note (Signed)
Describes postnasal drip, but not enough that he bothers with antihistamines or flonase.

## 2011-01-29 NOTE — Patient Instructions (Signed)
Please keep thinking about a serious effort to stop smoking.  Order- CXR  Dx lung nodule

## 2011-02-02 NOTE — Progress Notes (Signed)
Quick Note:  Pt came by the office and was given his results. ______

## 2011-03-27 LAB — AFB CULTURE WITH SMEAR (NOT AT ARMC)

## 2011-03-27 LAB — FUNGUS CULTURE W SMEAR

## 2011-04-10 ENCOUNTER — Ambulatory Visit (INDEPENDENT_AMBULATORY_CARE_PROVIDER_SITE_OTHER): Payer: Medicare Other | Admitting: *Deleted

## 2011-04-10 DIAGNOSIS — Z23 Encounter for immunization: Secondary | ICD-10-CM

## 2011-05-08 ENCOUNTER — Other Ambulatory Visit: Payer: Self-pay | Admitting: Sports Medicine

## 2011-05-08 DIAGNOSIS — M545 Low back pain: Secondary | ICD-10-CM

## 2011-05-12 ENCOUNTER — Ambulatory Visit
Admission: RE | Admit: 2011-05-12 | Discharge: 2011-05-12 | Disposition: A | Discharge status: Home / Self Care | Payer: Medicare Other | Source: Ambulatory Visit | Attending: Sports Medicine | Admitting: Sports Medicine

## 2011-05-12 DIAGNOSIS — M545 Low back pain: Secondary | ICD-10-CM

## 2011-05-22 ENCOUNTER — Ambulatory Visit (INDEPENDENT_AMBULATORY_CARE_PROVIDER_SITE_OTHER): Payer: Medicare Other | Admitting: Endocrinology

## 2011-05-22 ENCOUNTER — Encounter: Payer: Self-pay | Admitting: Endocrinology

## 2011-05-22 VITALS — BP 116/72 | HR 62 | Temp 97.9°F | Ht 74.0 in | Wt 177.0 lb

## 2011-05-22 DIAGNOSIS — J069 Acute upper respiratory infection, unspecified: Secondary | ICD-10-CM

## 2011-05-22 MED ORDER — AZITHROMYCIN 500 MG PO TABS: 500.0000 mg | ORAL_TABLET | Freq: Every day | ORAL | Status: AC

## 2011-05-22 NOTE — Progress Notes (Signed)
Subjective:    Patient ID: David Buck, male    DOB: 28-May-1941, 70 y.o.   MRN: [id number]  HPI Pt states few days of slight pain at the throat, and assoc rhinorrhea. Past Medical History  Diagnosis Date  . Tinnitus   . Osteoarthritis of right knee     Dr Eulah Pont  . Prostatitis   . History of hepatitis B     recovered  . COPD (chronic obstructive pulmonary disease)   . Hypothyroidism   . Meniere's disease   . LAFB (left anterior fascicular block) 2008    on EKG  . Allergy     rhinitis  . Posterior vitreous detachment 2011    R, body- Dr Allyne Gee    Past Surgical History  Procedure Date  . Inguinal hernia repair 2005    Dr Daphine Deutscher  . Right knee menisc tear repair   . Bronchoscopy 04-14-08    benign    History   Social History  . Marital Status: Married    Spouse Name: N/A    Number of Children: N/A  . Years of Education: N/A   Occupational History  . Not on file.   Social History Main Topics  . Smoking status: Current Everyday Smoker -- 1.0 packs/day for 42 years    Types: Cigarettes  . Smokeless tobacco: Not on file  . Alcohol Use: Yes     1-2 drinks daily  . Drug Use:   . Sexually Active:    Other Topics Concern  . Not on file   Social History Narrative  . No narrative on file    Current Outpatient Prescriptions on File Prior to Visit  Medication Sig Dispense Refill  . aspirin 81 MG EC tablet Take 81 mg by mouth daily.        . Cholecalciferol (EQL VITAMIN D3) 1000 UNITS tablet Take 1,000 Units by mouth daily.        . fluticasone (FLONASE) 50 MCG/ACT nasal spray Place 2 sprays into the nose daily. As needed      . Glucosamine HCl 1000 MG TABS Take 1 tablet by mouth daily.        Marland Kitchen levothyroxine (SYNTHROID, LEVOTHROID) 75 MCG tablet Take 1 tablet (75 mcg total) by mouth daily.  90 tablet  3  . loratadine (CLARITIN) 10 MG tablet Take 10 mg by mouth daily as needed.        . multivitamin (THERAGRAN) per tablet Take 1 tablet by mouth daily.          . saw palmetto 160 MG capsule Take 160 mg by mouth daily.        . vitamin C (ASCORBIC ACID) 500 MG tablet Take 500 mg by mouth daily.          Allergies  Allergen Reactions  . Cefdinir     REACTION: Rash    Family History  Problem Relation Age of Onset  . Cancer Mother     ovarian and bowel  . Other Father     cerebral hemorrage  . Hypertension Other    BP 116/72  Pulse 62  Temp(Src) 97.9 F (36.6 C) (Oral)  Ht 6\' 2"  (1.88 m)  Wt 177 lb (80.287 kg)  BMI 22.73 kg/m2  SpO2 97% Review of Systems Denies fever and nasal congestion    Objective:   Physical Exam VITAL SIGNS:  See vs page GENERAL: no distress head: no deformity eyes: no periorbital swelling, no proptosis external nose and ears are normal  mouth: the uvula is slightly red and swollen Both eac's and tm's are normal.    Assessment & Plan:  URI, new

## 2011-05-22 NOTE — Patient Instructions (Addendum)
i have sent a prescription to your pharmacy, for an antibiotic. Loratadine (non-prescription) will help your congestion. I hope you feel better soon.  If you don't feel better by next week, please call your doctor.

## 2011-10-22 DIAGNOSIS — H40019 Open angle with borderline findings, low risk, unspecified eye: Secondary | ICD-10-CM | POA: Diagnosis not present

## 2011-11-05 ENCOUNTER — Encounter: Payer: Self-pay | Admitting: Internal Medicine

## 2011-11-05 ENCOUNTER — Ambulatory Visit (INDEPENDENT_AMBULATORY_CARE_PROVIDER_SITE_OTHER): Payer: Medicare Other | Admitting: Internal Medicine

## 2011-11-05 VITALS — BP 140/86 | HR 68 | Temp 97.9°F | Ht 74.0 in | Wt 175.0 lb

## 2011-11-05 DIAGNOSIS — F17200 Nicotine dependence, unspecified, uncomplicated: Secondary | ICD-10-CM | POA: Diagnosis not present

## 2011-11-05 DIAGNOSIS — E785 Hyperlipidemia, unspecified: Secondary | ICD-10-CM

## 2011-11-05 DIAGNOSIS — R030 Elevated blood-pressure reading, without diagnosis of hypertension: Secondary | ICD-10-CM | POA: Diagnosis not present

## 2011-11-05 DIAGNOSIS — J449 Chronic obstructive pulmonary disease, unspecified: Secondary | ICD-10-CM | POA: Diagnosis not present

## 2011-11-05 DIAGNOSIS — R634 Abnormal weight loss: Secondary | ICD-10-CM | POA: Diagnosis not present

## 2011-11-05 DIAGNOSIS — Z0000 Encounter for general adult medical examination without abnormal findings: Secondary | ICD-10-CM | POA: Diagnosis not present

## 2011-11-05 DIAGNOSIS — IMO0001 Reserved for inherently not codable concepts without codable children: Secondary | ICD-10-CM | POA: Insufficient documentation

## 2011-11-05 DIAGNOSIS — I10 Essential (primary) hypertension: Secondary | ICD-10-CM | POA: Insufficient documentation

## 2011-11-05 DIAGNOSIS — R930 Abnormal findings on diagnostic imaging of skull and head, not elsewhere classified: Secondary | ICD-10-CM

## 2011-11-05 DIAGNOSIS — N320 Bladder-neck obstruction: Secondary | ICD-10-CM

## 2011-11-05 NOTE — Assessment & Plan Note (Signed)
Pulm f/u is pending

## 2011-11-05 NOTE — Progress Notes (Signed)
Patient ID: David Buck, male   DOB: February 02, 1941, 71 y.o.   MRN: [id number]  Subjective:    Patient ID: David Buck, male    DOB: 11-17-1940, 71 y.o.   MRN: [id number]  HPI  The patient is here for a wellness exam. The patient has been doing well overall without major physical or psychological issues going on lately. The patient presents for a follow-up of  chronic hypothyroidism controlled with medicines  Wt Readings from Last 3 Encounters:  11/05/11 175 lb (79.379 kg)  05/22/11 177 lb (80.287 kg)  01/29/11 177 lb 6.4 oz (80.468 kg)   BP Readings from Last 3 Encounters:  11/05/11 140/86  05/22/11 116/72  01/29/11 100/66      Review of Systems  Constitutional: Negative for fever, activity change, appetite change, fatigue and unexpected weight change.  HENT: Negative for nosebleeds, congestion, sore throat, sneezing, trouble swallowing and neck pain.   Eyes: Negative for redness, itching and visual disturbance.  Respiratory: Negative for apnea, cough, choking and shortness of breath.   Cardiovascular: Negative for chest pain, palpitations and leg swelling.  Gastrointestinal: Negative for nausea, diarrhea, blood in stool and abdominal distention.  Genitourinary: Negative for frequency and hematuria.  Musculoskeletal: Negative for back pain, joint swelling and gait problem.  Skin: Negative for rash.  Neurological: Negative for dizziness, tremors, speech difficulty, weakness and light-headedness.  Hematological: Negative for adenopathy.  Psychiatric/Behavioral: Negative for sleep disturbance, dysphoric mood and agitation. The patient is not nervous/anxious.        Objective:   Physical Exam  Constitutional: He is oriented to person, place, and time. He appears well-developed and well-nourished. No distress.  HENT:  Head: Normocephalic and atraumatic.  Right Ear: External ear normal.  Left Ear: External ear normal.  Nose: Nose normal.  Mouth/Throat: Oropharynx is clear  and moist. No oropharyngeal exudate.  Eyes: Conjunctivae and EOM are normal. Pupils are equal, round, and reactive to light. Right eye exhibits no discharge. Left eye exhibits no discharge. No scleral icterus.  Neck: Normal range of motion. Neck supple. No JVD present. No tracheal deviation present. No thyromegaly present.  Cardiovascular: Normal rate, regular rhythm, normal heart sounds and intact distal pulses.  Exam reveals no gallop and no friction rub.   No murmur heard.      Rare irreg beats  Pulmonary/Chest: Effort normal and breath sounds normal. No stridor. No respiratory distress. He has no wheezes. He has no rales. He exhibits no tenderness.  Abdominal: Soft. Bowel sounds are normal. He exhibits no distension and no mass. There is no tenderness. There is no rebound and no guarding.  Genitourinary: Rectum normal, prostate normal and penis normal. Guaiac negative stool. No penile tenderness.  Musculoskeletal: Normal range of motion. He exhibits no edema and no tenderness.  Lymphadenopathy:    He has no cervical adenopathy.  Neurological: He is alert and oriented to person, place, and time. He has normal reflexes. No cranial nerve deficit. He exhibits normal muscle tone. Coordination normal.  Skin: Skin is warm and dry. No rash noted. He is not diaphoretic. No erythema. No pallor.  Psychiatric: He has a normal mood and affect. His behavior is normal. Judgment and thought content normal.   EKG was compared to previous     Lab Results  Component Value Date   WBC 6.9 10/26/2010   HGB 13.9 10/26/2010   HCT 40.8 10/26/2010   PLT 242.0 10/26/2010   CHOL 127 10/26/2010   TRIG 49.0 10/26/2010  HDL 45.60 10/26/2010   ALT 21 10/26/2010   AST 20 10/26/2010   NA 137 10/26/2010   K 4.1 10/26/2010   CL 103 10/26/2010   CREATININE 0.8 10/26/2010   BUN 21 10/26/2010   CO2 26 10/26/2010   TSH 4.17 10/26/2010   PSA 1.28 10/26/2010   INR 1.0 RATIO 04/07/2008   HGBA1C 5.8 10/26/2010     Assessment & Plan:

## 2011-11-05 NOTE — Assessment & Plan Note (Signed)

## 2011-11-05 NOTE — Assessment & Plan Note (Signed)
Not using any MDIs

## 2011-11-05 NOTE — Assessment & Plan Note (Signed)
Discussed.

## 2011-11-05 NOTE — Assessment & Plan Note (Signed)
Wt Readings from Last 3 Encounters:  11/05/11 175 lb (79.379 kg)  05/22/11 177 lb (80.287 kg)  01/29/11 177 lb 6.4 oz (80.468 kg)

## 2011-11-05 NOTE — Assessment & Plan Note (Signed)
Check BP at home

## 2011-11-06 ENCOUNTER — Other Ambulatory Visit (INDEPENDENT_AMBULATORY_CARE_PROVIDER_SITE_OTHER): Payer: Medicare Other

## 2011-11-06 DIAGNOSIS — F17200 Nicotine dependence, unspecified, uncomplicated: Secondary | ICD-10-CM | POA: Diagnosis not present

## 2011-11-06 DIAGNOSIS — J449 Chronic obstructive pulmonary disease, unspecified: Secondary | ICD-10-CM | POA: Diagnosis not present

## 2011-11-06 DIAGNOSIS — Z0000 Encounter for general adult medical examination without abnormal findings: Secondary | ICD-10-CM

## 2011-11-06 DIAGNOSIS — N320 Bladder-neck obstruction: Secondary | ICD-10-CM

## 2011-11-06 DIAGNOSIS — R634 Abnormal weight loss: Secondary | ICD-10-CM

## 2011-11-06 DIAGNOSIS — R030 Elevated blood-pressure reading, without diagnosis of hypertension: Secondary | ICD-10-CM | POA: Diagnosis not present

## 2011-11-06 DIAGNOSIS — E785 Hyperlipidemia, unspecified: Secondary | ICD-10-CM

## 2011-11-06 DIAGNOSIS — IMO0001 Reserved for inherently not codable concepts without codable children: Secondary | ICD-10-CM

## 2011-11-06 DIAGNOSIS — J4489 Other specified chronic obstructive pulmonary disease: Secondary | ICD-10-CM

## 2011-11-06 LAB — LIPID PANEL
Cholesterol: 118 mg/dL (ref 0–200)
LDL Cholesterol: 60 mg/dL (ref 0–99)

## 2011-11-06 LAB — BASIC METABOLIC PANEL
BUN: 19 mg/dL (ref 6–23)
CO2: 28 mEq/L (ref 19–32)
Calcium: 9.4 mg/dL (ref 8.4–10.5)
Chloride: 104 mEq/L (ref 96–112)
Creatinine, Ser: 0.9 mg/dL (ref 0.4–1.5)
GFR: 91.06 mL/min (ref >60.00)
Glucose, Bld: 93 mg/dL (ref 70–99)
Potassium: 3.7 mEq/L (ref 3.5–5.1)
Sodium: 138 mEq/L (ref 135–145)

## 2011-11-06 LAB — CBC WITH DIFFERENTIAL/PLATELET
Basophils Absolute: 0 10*3/uL (ref 0.0–0.1)
Basophils Relative: 0.8 % (ref 0.0–3.0)
Eosinophils Absolute: 0.4 10*3/uL (ref 0.0–0.7)
Eosinophils Relative: 6.3 % — ABNORMAL HIGH (ref 0.0–5.0)
HCT: 39.6 % (ref 39.0–52.0)
Hemoglobin: 13.2 g/dL (ref 13.0–17.0)
Lymphocytes Relative: 24.1 % (ref 12.0–46.0)
Lymphs Abs: 1.5 10*3/uL (ref 0.7–4.0)
MCHC: 33.3 g/dL (ref 30.0–36.0)
MCV: 95.3 fl (ref 78.0–100.0)
Monocytes Absolute: 0.6 10*3/uL (ref 0.1–1.0)
Monocytes Relative: 9.2 % (ref 3.0–12.0)
Neutro Abs: 3.7 10*3/uL (ref 1.4–7.7)
Neutrophils Relative %: 59.6 % (ref 43.0–77.0)
Platelets: 227 10*3/uL (ref 150.0–400.0)
RBC: 4.15 Mil/uL — ABNORMAL LOW (ref 4.22–5.81)
RDW: 12.9 % (ref 11.5–14.6)
WBC: 6.2 10*3/uL (ref 4.5–10.5)

## 2011-11-06 LAB — URINALYSIS, ROUTINE W REFLEX MICROSCOPIC
Specific Gravity, Urine: 1.02 (ref 1.000–1.030)
Total Protein, Urine: NEGATIVE
Urine Glucose: NEGATIVE

## 2011-11-06 LAB — TSH: TSH: 4.14 u[IU]/mL (ref 0.35–5.50)

## 2011-11-06 LAB — HEPATIC FUNCTION PANEL
Alkaline Phosphatase: 64 U/L (ref 39–117)
Bilirubin, Direct: 0.1 mg/dL (ref 0.0–0.3)
Total Protein: 7.2 g/dL (ref 6.0–8.3)

## 2011-11-16 ENCOUNTER — Other Ambulatory Visit: Payer: Self-pay | Admitting: Internal Medicine

## 2012-01-29 ENCOUNTER — Ambulatory Visit (INDEPENDENT_AMBULATORY_CARE_PROVIDER_SITE_OTHER): Payer: Medicare Other | Admitting: Internal Medicine

## 2012-01-29 ENCOUNTER — Encounter: Payer: Self-pay | Admitting: Internal Medicine

## 2012-01-29 VITALS — BP 118/68 | HR 80 | Ht 74.5 in | Wt 175.0 lb

## 2012-01-29 DIAGNOSIS — J984 Other disorders of lung: Secondary | ICD-10-CM

## 2012-01-29 DIAGNOSIS — J309 Allergic rhinitis, unspecified: Secondary | ICD-10-CM | POA: Diagnosis not present

## 2012-01-29 DIAGNOSIS — F17200 Nicotine dependence, unspecified, uncomplicated: Secondary | ICD-10-CM | POA: Diagnosis not present

## 2012-01-29 DIAGNOSIS — J449 Chronic obstructive pulmonary disease, unspecified: Secondary | ICD-10-CM | POA: Diagnosis not present

## 2012-01-29 NOTE — Progress Notes (Signed)
Subjective:    Patient ID: David Buck, male    DOB: 12-27-1940, 71 y.o.   MRN: [id number]  HPI 01/29/11- 70 yoM 1 PPD smoker followed for small lung nodules/ RUL nodular scarring/ benign bronchoscopy 04/14/08- neg AFB.   Wife here        Last here 01/30/2010 CXR- 01/30/10- COPD with stable scarring, NAD.  Aware of some pn drip- could take antihistamine, but doesn't bother him enough to need to. Denies productive cough, night sweats, weight loss, fever, nodes..  Some cough, but no real change in pattern  01/29/12- 70 yoM 1 PPD smoker followed for small lung nodules/ RUL nodular scarring/ benign bronchoscopy 04/14/08- neg AFB. Doing well overall unless more active (gets carried away with it).  Wife here He is no more inclined than before, to stop smoking. His wife's feelings about the issue are clear. He denies any change in his breathing. We had a sustained conversation about atypical AFB. He describes some bland cough and blames allergy for postnasal drip. These Dr. Annalee Genta had offered to repair septal deviation but patient refused at this time. He is using one half tab Claritin. Denies headache or discolored mucus and has no history of glaucoma. COPD assessment test (CAT) score 6/40. CXR 01/29/11- discussed with them. 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  Original Report Authenticated By: Andreas Newport, M.D.    ROS-see HPI Constitutional:   No-   weight loss, night sweats, fevers, chills, fatigue, lassitude. HEENT:   No-  headaches, difficulty swallowing, tooth/dental problems, sore throat,       No-  sneezing, itching, ear ache, nasal congestion, +post nasal drip,  CV:  No-   chest pain, orthopnea, PND, swelling in lower extremities, anasarca, dizziness, palpitations Resp: No-   shortness of breath with exertion or at rest.              No-   productive cough,  + non-productive cough,  No- coughing up of blood.              No-   change in color of mucus.   No- wheezing.   Skin: No-   rash or lesions. GI:  No-   heartburn, indigestion, abdominal pain, nausea, vomiting,  GU:  MS:  No-   joint pain or swelling.  Neuro-     nothing unusual Psych:  No- change in mood or affect. No depression or anxiety.  No memory loss.  Objective:   Physical Exam General- Alert, Oriented, Affect-appropriate, Distress- none acute. tall slender man Skin- rash-none, lesions- none, excoriation- none Lymphadenopathy- none Head- atraumatic            Eyes- Gross vision intact, PERRLA, conjunctivae clear secretions            Ears- Hearing, canals normal            Nose- Clear, No- Septal dev, +mucus postnasal drip, polyps, erosion, perforation             Throat- Mallampati II , mucosa clear , drainage- none, tonsils- atrophic  dentures Neck- flexible , trachea midline, no stridor , thyroid nl, carotid no bruit Chest - symmetrical excursion , unlabored           Heart/CV- RRR , no murmur , no gallop  , no rub, nl s1 s2                           - JVD-  none , edema- none, stasis changes- none, varices- none           Lung- clear to P&A, wheeze- none, cough- none , dullness-none, rub- none           Chest wall-  Abd-  Br/ Gen/ Rectal- Not done, not indicated Extrem- cyanosis- none, clubbing, none, atrophy- none, strength- nl Neuro- grossly intact to observation    Assessment & Plan:

## 2012-01-29 NOTE — Patient Instructions (Addendum)
I will continue to recommend that you not smoke.  Sample Dymista nasal spray   1-2 puffs each nostril every night at bedtime  We'll skip CXR this year.

## 2012-02-03 NOTE — Assessment & Plan Note (Signed)
After discussion of options we agreed to skip chest x-ray this year

## 2012-02-03 NOTE — Assessment & Plan Note (Signed)
Light smoker but abstinence again discussed and encouraged.

## 2012-02-03 NOTE — Assessment & Plan Note (Addendum)
Post nasal drip-allergic versus nonallergic. Plan-sample Dymista nasal spray

## 2012-02-03 NOTE — Assessment & Plan Note (Signed)
This may be an active MAIC but will be followed at intervals.

## 2012-03-21 ENCOUNTER — Other Ambulatory Visit: Payer: Self-pay | Admitting: Internal Medicine

## 2012-04-02 ENCOUNTER — Ambulatory Visit (INDEPENDENT_AMBULATORY_CARE_PROVIDER_SITE_OTHER): Payer: Medicare Other

## 2012-04-02 DIAGNOSIS — Z23 Encounter for immunization: Secondary | ICD-10-CM | POA: Diagnosis not present

## 2012-04-09 ENCOUNTER — Ambulatory Visit: Payer: Medicare Other

## 2012-06-23 DIAGNOSIS — S63509A Unspecified sprain of unspecified wrist, initial encounter: Secondary | ICD-10-CM | POA: Diagnosis not present

## 2012-07-25 ENCOUNTER — Other Ambulatory Visit: Payer: Self-pay | Admitting: Internal Medicine

## 2012-09-01 ENCOUNTER — Encounter: Payer: Self-pay | Admitting: Sports Medicine

## 2012-09-01 ENCOUNTER — Ambulatory Visit (INDEPENDENT_AMBULATORY_CARE_PROVIDER_SITE_OTHER): Payer: Medicare Other | Admitting: Sports Medicine

## 2012-09-01 VITALS — BP 145/87 | HR 76 | Ht 74.0 in | Wt 175.0 lb

## 2012-09-01 DIAGNOSIS — M25569 Pain in unspecified knee: Secondary | ICD-10-CM | POA: Diagnosis not present

## 2012-09-02 NOTE — Progress Notes (Signed)
  Subjective:    Patient ID: David Buck, male    DOB: [birth year], 72 y.o.   MRN: [id number]  HPI chief complaint: Right knee pain  Very pleasant 72 year old male comes in today complaining of 3 weeks of lateral right knee pain. Pain began after he was shoveling some snow. No acute injury at the time but rather a sudden onset of pain afterwards. This pain has improved somewhat but not resolved. He describes an aching discomfort along the lateral knee which is worse with weightbearing. No locking or catching. He's not noticed any swelling. He's had problems with this same knee in the past. He is status post arthroscopy for meniscectomy. This was done several years ago by Dr. Richardson Landry. I've seen the patient multiple times at [facility]. He's had cortisone injections as well as distal supplementation. He is asking about the possibility of a repeat cortisone injection today. He denies pain in his groin. No fevers or chill. No radiating pain down the leg. He is here today with his wife.  Past medical history and current medications are reviewed. He is allergic to Cefdinir Socially he smokes a pack of cigarettes a day and has done so for 42 years. Drinks alcohol on occasional basis. He is retired.    Review of Systems     Objective:   Physical Exam Well-developed, well-nourished. No acute distress. Awake alert and oriented x3  Right knee: Full range of motion. No effusion. He is tender to palpation along the lateral joint line with pain but no popping with McMurray's. No tenderness along the medial joint line. Trace patellofemoral crepitus. Knee is stable to ligamentous exam. Mild varus deformity. Neurovascular intact distally. Walking without significant limp.       Assessment & Plan:  1. Right knee pain likely secondary to DJD  I recommended an intra-articular cortisone injection. This was done using an anterior medial approach. He tolerated the injection without  difficulty. He will followup with me in 3 weeks. We discussed the possibility of Visco supplementation if cortisone injection does not help but I would want to order updated x-rays of his knee first. I've requested the records from [facility].  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 He had x-rays done at [facility] recently. I've asked him to bring his x-rays with him to that visit. We may consider an ultrasound guided cortisone injection if symptoms warrant.

## 2012-09-22 ENCOUNTER — Ambulatory Visit (INDEPENDENT_AMBULATORY_CARE_PROVIDER_SITE_OTHER): Payer: Medicare Other | Admitting: Sports Medicine

## 2012-09-22 VITALS — BP 140/78 | Ht 75.0 in | Wt 175.0 lb

## 2012-09-22 DIAGNOSIS — M79609 Pain in unspecified limb: Secondary | ICD-10-CM

## 2012-09-22 DIAGNOSIS — M79644 Pain in right finger(s): Secondary | ICD-10-CM

## 2012-09-22 DIAGNOSIS — M19049 Primary osteoarthritis, unspecified hand: Secondary | ICD-10-CM

## 2012-09-22 NOTE — Progress Notes (Signed)
  Subjective:    Patient ID: David Buck, male    DOB: 1940-07-30, 72 y.o.   MRN: [id number]  HPI  Patient comes in today for followup. Right knee is feeling better after recent cortisone injection. Right thumb is still bothering him however. Pain is localized to the right [facility] joint. He brought x-rays from [facility] for my review.    Review of Systems     Objective:   Physical Exam Well-developed, well-nourished. No acute distress  Right knee: Full range of motion. No effusion. No joint line tenderness. Negative McMurray's. Right thumb: Pain with CMC grind. Crepitus with CMC grind as well. No soft tissue swelling. No atrophy. Neurovascularly intact distally.  X-rays of his right knee from 10/30/2010 shows moderate medial compartmental narrowing particularly on the 30 flexion view. X-rays of his right thumb from 06/23/2012 shows moderate CMC degenerative changes with nothing acute.       Assessment & Plan:  1. Improved right knee pain secondary to DJD 2. Right thumb pain secondary to [facility] osteoarthritis  Patient would like to proceed with a cortisone injection into the right CMC joint. This was performed today under ultrasound guidance. He has a CMC splint that he can wear as needed. In regards to the right knee, patient is asking about the possibility of Supartz. He has had good success with this in the past. I've told him that with his improvement with cortisone I would want to wait on that for now. However, we could reconsider this down the road. If we were to start another round of Visco supplementation I would want to first order an updated x-ray of his knee. He will followup when necessary.  Consent obtained and verified. Time-out conducted. Noted no overlying erythema, induration, or other signs of local infection. Skin prepped in a sterile fashion. Topical analgesic spray: Ethyl chloride. Joint: right cmc Needle: 25g 5/8 inch Completed without  difficulty. Meds: 1/2 cc 1%lidocaine, 1/2 cc (20mg ) depomedrol  Advised to call if fevers/chills, erythema, induration, drainage, or persistent bleeding. Patient cautioned about skin hypopigmentation prior to injection.

## 2012-10-17 ENCOUNTER — Other Ambulatory Visit (INDEPENDENT_AMBULATORY_CARE_PROVIDER_SITE_OTHER): Payer: Medicare Other

## 2012-10-17 ENCOUNTER — Telehealth: Payer: Self-pay

## 2012-10-17 ENCOUNTER — Encounter: Payer: Self-pay | Admitting: Internal Medicine

## 2012-10-17 ENCOUNTER — Ambulatory Visit (INDEPENDENT_AMBULATORY_CARE_PROVIDER_SITE_OTHER): Payer: Medicare Other | Admitting: Internal Medicine

## 2012-10-17 VITALS — BP 140/90 | HR 78 | Temp 97.9°F | Wt 182.8 lb

## 2012-10-17 DIAGNOSIS — I10 Essential (primary) hypertension: Secondary | ICD-10-CM

## 2012-10-17 DIAGNOSIS — E039 Hypothyroidism, unspecified: Secondary | ICD-10-CM

## 2012-10-17 LAB — BASIC METABOLIC PANEL
CO2: 30 mEq/L (ref 19–32)
Chloride: 103 mEq/L (ref 96–112)
Potassium: 4.3 mEq/L (ref 3.5–5.1)
Sodium: 138 mEq/L (ref 135–145)

## 2012-10-17 LAB — LIPID PANEL
HDL: 41.6 mg/dL (ref >39.00)
LDL Cholesterol: 66 mg/dL (ref 0–99)
Total CHOL/HDL Ratio: 3
Triglycerides: 63 mg/dL (ref 0.0–149.0)

## 2012-10-17 LAB — CBC
RDW: 13.8 % (ref 11.5–14.6)
WBC: 7.5 10*3/uL (ref 4.5–10.5)

## 2012-10-17 MED ORDER — HYDROCHLOROTHIAZIDE 12.5 MG PO CAPS: 12.5000 mg | ORAL_CAPSULE | Freq: Every day | ORAL | Status: DC

## 2012-10-17 NOTE — Progress Notes (Signed)
Subjective:    Patient ID: David Buck, male    DOB: 02/15/1941, 72 y.o.   MRN: [id number]  HPI  Pt presents to the clinic today with c/o elevated blood pressure. He does check his blood pressures at home. His blood pressure has ranged from 130- 160/90's. He has noticed more consistency since 08/2012 that his blood pressure have been greater than 140/90. He has had episodes of elevated blood pressure in the past that has not had treatment. Pt denies blurred vision, headache, abnormal bleeding, nausea or vomiting. He does c/o of intermittent lightheadedness.  Review of Systems      Past Medical History  Diagnosis Date  . Tinnitus   . Osteoarthritis of right knee     Dr Eulah Pont  . Prostatitis   . History of hepatitis B     recovered  . COPD (chronic obstructive pulmonary disease)   . Hypothyroidism   . Meniere's disease   . LAFB (left anterior fascicular block) 2008    on EKG  . Allergy     rhinitis  . Posterior vitreous detachment 2011    R, body- Dr Allyne Gee    Current Outpatient Prescriptions  Medication Sig Dispense Refill  . aspirin 81 MG EC tablet Take 81 mg by mouth daily.        . Cholecalciferol (EQL VITAMIN D3) 1000 UNITS tablet Take 1,000 Units by mouth daily.        . fluticasone (FLONASE) 50 MCG/ACT nasal spray Place 2 sprays into the nose daily. As needed      . Glucosamine HCl 1000 MG TABS Take 1 tablet by mouth daily.        Marland Kitchen levothyroxine (SYNTHROID, LEVOTHROID) 75 MCG tablet TAKE ONE TABLET BY MOUTH DAILY  60 tablet  2  . loratadine (CLARITIN) 10 MG tablet Take 10 mg by mouth daily as needed.        . multivitamin (THERAGRAN) per tablet Take 1 tablet by mouth daily.        . saw palmetto 160 MG capsule Take 160 mg by mouth daily.        . vitamin C (ASCORBIC ACID) 500 MG tablet Take 500 mg by mouth daily.         No current facility-administered medications for this visit.    Allergies  Allergen Reactions  . Cefdinir     REACTION: Rash    Family  History  Problem Relation Age of Onset  . Cancer Mother     ovarian and bowel  . Other Father     cerebral hemorrage  . Hypertension Other     History   Social History  . Marital Status: Married    Spouse Name: N/A    Number of Children: N/A  . Years of Education: N/A   Occupational History  . Not on file.   Social History Main Topics  . Smoking status: Current Every Day Smoker -- 1.00 packs/day for 42 years    Types: Cigarettes  . Smokeless tobacco: Never Used  . Alcohol Use: Yes     Comment: 1-2 drinks daily  . Drug Use: Not on file  . Sexually Active: Not on file   Other Topics Concern  . Not on file   Social History Narrative  . No narrative on file     Constitutional: Denies fever, malaise, fatigue, headache or abrupt weight changes.  HEENT: Denies eye pain, eye redness, ear pain, ringing in the ears, wax buildup, runny nose, nasal  congestion, bloody nose, or sore throat. Respiratory: Denies difficulty breathing, shortness of breath, cough or sputum production.   Cardiovascular: Denies chest pain, chest tightness, palpitations or swelling in the hands or feet.   Neurological: Denies dizziness, difficulty with memory, difficulty with speech or problems with balance and coordination.   No other specific complaints in a complete review of systems (except as listed in HPI above).  Objective:   Physical Exam  BP 140/90  Pulse 78  Temp(Src) 97.9 F (36.6 C) (Oral)  Wt 182 lb 12.8 oz (82.918 kg)  BMI 22.85 kg/m2  SpO2 96% Wt Readings from Last 3 Encounters:  10/17/12 182 lb 12.8 oz (82.918 kg)  09/22/12 175 lb (79.379 kg)  09/01/12 175 lb (79.379 kg)    General: Appears her stated age, well developed, well nourished in NAD.Marland Kitchen  Cardiovascular: Normal rate and rhythm. S1,S2 noted.  No murmur, rubs or gallops noted. No JVD or BLE edema. No carotid bruits noted. Pulmonary/Chest: Normal effort and positive vesicular breath sounds. No respiratory distress. No  wheezes, rales or ronchi noted.   Neurological: Alert and oriented. Cranial nerves II-XII intact. Coordination normal. +DTRs bilaterally.        Assessment & Plan:

## 2012-10-17 NOTE — Patient Instructions (Signed)

## 2012-10-17 NOTE — Telephone Encounter (Signed)
Message copied by Noreene Larsson on Fri Oct 17, 2012  4:11 PM ------      Message from: Lorre Munroe      Created: Fri Oct 17, 2012  4:05 PM       Can you please call Mr. Mahr and let him him know that All labs are normal. We will recheck potassium at your next visit. ------

## 2012-10-17 NOTE — Assessment & Plan Note (Signed)
New diagnosis Continue to monitor drugs at home Will start HCTZ 12. 5 mg Will check labs today  RTC in 1 month for recheck of blood pressure and check potassium level

## 2012-10-17 NOTE — Telephone Encounter (Signed)
Pt notified of normal labs

## 2012-10-17 NOTE — Assessment & Plan Note (Signed)
Will recheck TSH today Will tiitrate mmeds based on therapy

## 2012-11-05 ENCOUNTER — Encounter: Payer: Medicare Other | Admitting: Internal Medicine

## 2012-11-12 ENCOUNTER — Encounter: Payer: Self-pay | Admitting: Internal Medicine

## 2012-11-12 ENCOUNTER — Ambulatory Visit (INDEPENDENT_AMBULATORY_CARE_PROVIDER_SITE_OTHER): Payer: Medicare Other | Admitting: Internal Medicine

## 2012-11-12 VITALS — BP 104/72 | HR 80 | Temp 98.0°F | Resp 16 | Wt 178.0 lb

## 2012-11-12 DIAGNOSIS — Z0000 Encounter for general adult medical examination without abnormal findings: Secondary | ICD-10-CM | POA: Diagnosis not present

## 2012-11-12 DIAGNOSIS — I10 Essential (primary) hypertension: Secondary | ICD-10-CM

## 2012-11-12 DIAGNOSIS — N320 Bladder-neck obstruction: Secondary | ICD-10-CM | POA: Diagnosis not present

## 2012-11-12 MED ORDER — HYDROCHLOROTHIAZIDE 12.5 MG PO CAPS: 12.5000 mg | ORAL_CAPSULE | Freq: Every day | ORAL | Status: DC

## 2012-11-12 NOTE — Progress Notes (Signed)
Subjective:    HPI  The patient is here for a wellness exam. The patient has been doing well overall without major physical or psychological issues going on lately. The patient presents for a follow-up of  chronic hypothyroidism controlled with medicines  Wt Readings from Last 3 Encounters:  11/12/12 178 lb (80.74 kg)  10/17/12 182 lb 12.8 oz (82.918 kg)  09/22/12 175 lb (79.379 kg)   BP Readings from Last 3 Encounters:  11/12/12 104/72  10/17/12 140/90  09/22/12 140/78      Review of Systems  Constitutional: Negative for fever, activity change, appetite change, fatigue and unexpected weight change.  HENT: Negative for nosebleeds, congestion, sore throat, sneezing, trouble swallowing and neck pain.   Eyes: Negative for redness, itching and visual disturbance.  Respiratory: Negative for apnea, cough, choking and shortness of breath.   Cardiovascular: Negative for chest pain, palpitations and leg swelling.  Gastrointestinal: Negative for nausea, diarrhea, blood in stool and abdominal distention.  Genitourinary: Negative for frequency and hematuria.  Musculoskeletal: Negative for back pain, joint swelling and gait problem.  Skin: Negative for rash.  Neurological: Negative for dizziness, tremors, speech difficulty, weakness and light-headedness.  Hematological: Negative for adenopathy.  Psychiatric/Behavioral: Negative for sleep disturbance, dysphoric mood and agitation. The patient is not nervous/anxious.        Objective:   Physical Exam  Constitutional: He is oriented to person, place, and time. He appears well-developed and well-nourished. No distress.  HENT:  Head: Normocephalic and atraumatic.  Right Ear: External ear normal.  Left Ear: External ear normal.  Nose: Nose normal.  Mouth/Throat: Oropharynx is clear and moist. No oropharyngeal exudate.  Eyes: Conjunctivae and EOM are normal. Pupils are equal, round, and reactive to light. Right eye exhibits no  discharge. Left eye exhibits no discharge. No scleral icterus.  Neck: Normal range of motion. Neck supple. No JVD present. No tracheal deviation present. No thyromegaly present.  Cardiovascular: Normal rate, regular rhythm, normal heart sounds and intact distal pulses.  Exam reveals no gallop and no friction rub.   No murmur heard. Rare irreg beats  Pulmonary/Chest: Effort normal and breath sounds normal. No stridor. No respiratory distress. He has no wheezes. He has no rales. He exhibits no tenderness.  Abdominal: Soft. Bowel sounds are normal. He exhibits no distension and no mass. There is no tenderness. There is no rebound and no guarding.  Genitourinary: Rectum normal, prostate normal and penis normal. Guaiac negative stool. No penile tenderness.  Musculoskeletal: Normal range of motion. He exhibits no edema and no tenderness.  Lymphadenopathy:    He has no cervical adenopathy.  Neurological: He is alert and oriented to person, place, and time. He has normal reflexes. No cranial nerve deficit. He exhibits normal muscle tone. Coordination normal.  Skin: Skin is warm and dry. No rash noted. He is not diaphoretic. No erythema. No pallor.  Psychiatric: He has a normal mood and affect. His behavior is normal. Judgment and thought content normal.   EKG was compared to previous     Lab Results  Component Value Date   WBC 7.5 10/17/2012   HGB 14.0 10/17/2012   HCT 41.5 10/17/2012   PLT 247.0 10/17/2012   CHOL 120 10/17/2012   TRIG 63.0 10/17/2012   HDL 41.60 10/17/2012   ALT 17 11/06/2011   AST 20 11/06/2011   NA 138 10/17/2012   K 4.3 10/17/2012   CL 103 10/17/2012   CREATININE 1.0 10/17/2012   BUN 20 10/17/2012  CO2 30 10/17/2012   TSH 3.68 10/17/2012   PSA 1.28 10/26/2010   INR 1.0 RATIO 04/07/2008   HGBA1C 5.8 10/26/2010     Assessment & Plan:

## 2012-11-13 ENCOUNTER — Other Ambulatory Visit: Payer: Self-pay | Admitting: Dermatology

## 2012-11-13 ENCOUNTER — Other Ambulatory Visit (INDEPENDENT_AMBULATORY_CARE_PROVIDER_SITE_OTHER): Payer: Medicare Other

## 2012-11-13 DIAGNOSIS — D1801 Hemangioma of skin and subcutaneous tissue: Secondary | ICD-10-CM | POA: Diagnosis not present

## 2012-11-13 DIAGNOSIS — N320 Bladder-neck obstruction: Secondary | ICD-10-CM | POA: Diagnosis not present

## 2012-11-13 DIAGNOSIS — I10 Essential (primary) hypertension: Secondary | ICD-10-CM

## 2012-11-13 DIAGNOSIS — L821 Other seborrheic keratosis: Secondary | ICD-10-CM | POA: Diagnosis not present

## 2012-11-13 DIAGNOSIS — D239 Other benign neoplasm of skin, unspecified: Secondary | ICD-10-CM | POA: Diagnosis not present

## 2012-11-13 DIAGNOSIS — L819 Disorder of pigmentation, unspecified: Secondary | ICD-10-CM | POA: Diagnosis not present

## 2012-11-13 DIAGNOSIS — D485 Neoplasm of uncertain behavior of skin: Secondary | ICD-10-CM | POA: Diagnosis not present

## 2012-11-13 LAB — BASIC METABOLIC PANEL
BUN: 19 mg/dL (ref 6–23)
Calcium: 9.6 mg/dL (ref 8.4–10.5)
GFR: 77.44 mL/min (ref >60.00)
Glucose, Bld: 101 mg/dL — ABNORMAL HIGH (ref 70–99)

## 2012-11-13 LAB — URINALYSIS, ROUTINE W REFLEX MICROSCOPIC
Bilirubin Urine: NEGATIVE
Ketones, ur: NEGATIVE
Leukocytes, UA: NEGATIVE
Urine Glucose: NEGATIVE
pH: 6.5 (ref 5.0–8.0)

## 2012-11-13 NOTE — Assessment & Plan Note (Signed)

## 2012-12-12 DIAGNOSIS — H04129 Dry eye syndrome of unspecified lacrimal gland: Secondary | ICD-10-CM | POA: Diagnosis not present

## 2013-01-29 ENCOUNTER — Ambulatory Visit (INDEPENDENT_AMBULATORY_CARE_PROVIDER_SITE_OTHER)
Admission: RE | Admit: 2013-01-29 | Discharge: 2013-01-29 | Disposition: A | Discharge status: Home / Self Care | Payer: Medicare Other | Source: Ambulatory Visit | Attending: Internal Medicine | Admitting: Internal Medicine

## 2013-01-29 ENCOUNTER — Ambulatory Visit (INDEPENDENT_AMBULATORY_CARE_PROVIDER_SITE_OTHER): Payer: Medicare Other | Admitting: Internal Medicine

## 2013-01-29 ENCOUNTER — Encounter: Payer: Self-pay | Admitting: Internal Medicine

## 2013-01-29 VITALS — BP 120/80 | HR 81 | Ht 74.0 in | Wt 180.0 lb

## 2013-01-29 DIAGNOSIS — J984 Other disorders of lung: Secondary | ICD-10-CM

## 2013-01-29 DIAGNOSIS — F17200 Nicotine dependence, unspecified, uncomplicated: Secondary | ICD-10-CM

## 2013-01-29 DIAGNOSIS — J449 Chronic obstructive pulmonary disease, unspecified: Secondary | ICD-10-CM | POA: Diagnosis not present

## 2013-01-29 NOTE — Patient Instructions (Addendum)
Order - CXR  Dx lung nodules, tobacco user  Please call as needed

## 2013-01-29 NOTE — Progress Notes (Signed)
Subjective:    Patient ID: David Buck, male    DOB: 1941/05/21, 72 y.o.   MRN: [id number]  HPI 01/29/11- 70 yoM 1 PPD smoker followed for small lung nodules/ RUL nodular scarring/ benign bronchoscopy 04/14/08- neg AFB.   Wife here        Last here 01/30/2010 CXR- 01/30/10- COPD with stable scarring, NAD.  Aware of some pn drip- could take antihistamine, but doesn't bother him enough to need to. Denies productive cough, night sweats, weight loss, fever, nodes..  Some cough, but no real change in pattern  01/29/12- 70 yoM 1 PPD smoker followed for small lung nodules/ RUL nodular scarring/ benign bronchoscopy 04/14/08- neg AFB. Doing well overall unless more active (gets carried away with it).  Wife here He is no more inclined than before, to stop smoking. His wife's feelings about the issue are clear. He denies any change in his breathing. We had a sustained conversation about atypical AFB. He describes some bland cough and blames allergy for postnasal drip. These Dr. Annalee Genta had offered to repair septal deviation but patient refused at this time. He is using one half tab Claritin. Denies headache or discolored mucus and has no history of glaucoma. COPD assessment test (CAT) score 6/40. CXR 01/29/11- discussed with them. 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  Original Report Authenticated By: Andreas Newport, M.D.   01/29/13- 70 yoM 1 PPD smoker followed for small lung nodules/ RUL nodular scarring/ benign bronchoscopy 04/14/08- neg AFB. Wife here Yearly follow up.  Breathing doing well overall.  No SOB, wheezing, chest tightness, chest pain, or cough at this time. Still smoking. He does ask this time about using E.-cigarettes. We discussed smoking cessation  ROS-see HPI Constitutional:   No-   weight loss, night sweats, fevers, chills, fatigue, lassitude. HEENT:   No-  headaches, difficulty swallowing, tooth/dental problems, sore throat,       No-  sneezing, itching,  ear ache, nasal congestion, +post nasal drip,  CV:  No-   chest pain, orthopnea, PND, swelling in lower extremities, anasarca, dizziness, palpitations Resp: No-   shortness of breath with exertion or at rest.              No-   productive cough,  No- non-productive cough,  No- coughing up of blood.              No-   change in color of mucus.  No- wheezing.   Skin: No-   rash or lesions. GI:  No-   heartburn, indigestion, abdominal pain, nausea, vomiting,  GU:  MS:  No-   joint pain or swelling.  Neuro-     nothing unusual Psych:  No- change in mood or affect. No depression or anxiety.  No memory loss.  Objective:   Physical Exam General- Alert, Oriented, Affect-appropriate, Distress- none acute. tall slender man Skin- rash-none, lesions- none, excoriation- none Lymphadenopathy- none Head- atraumatic            Eyes- Gross vision intact, PERRLA, conjunctivae clear secretions            Ears- Hearing, canals normal            Nose- Clear, No- Septal dev, +mucus postnasal drip, polyps, erosion, perforation             Throat- Mallampati II , mucosa clear , drainage- none, tonsils- atrophic  dentures Neck- flexible , trachea midline, no stridor , thyroid nl, carotid no bruit Chest - symmetrical  excursion , unlabored           Heart/CV- RRR , no murmur , no gallop  , no rub, nl s1 s2                           - JVD- none , edema- none, stasis changes- none, varices- none           Lung- +few basilar crackles, unlabored, wheeze- none, cough- none , dullness-none, rub- none           Chest wall-  Abd-  Br/ Gen/ Rectal- Not done, not indicated Extrem- cyanosis- none, clubbing, none, atrophy- none, strength- nl Neuro- grossly intact to observation    Assessment & Plan:

## 2013-01-30 NOTE — Progress Notes (Signed)
Quick Note:  Pt aware of results. ______ 

## 2013-02-10 NOTE — Assessment & Plan Note (Signed)
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  Plan-chest x-ray

## 2013-02-10 NOTE — Assessment & Plan Note (Signed)
We talked about smoking cessation. He was at least interested in electronic cigarettes as a supportive measure. We discussed what has been written about these.

## 2013-03-20 ENCOUNTER — Ambulatory Visit (INDEPENDENT_AMBULATORY_CARE_PROVIDER_SITE_OTHER): Payer: Medicare Other

## 2013-03-20 DIAGNOSIS — Z23 Encounter for immunization: Secondary | ICD-10-CM

## 2013-03-30 ENCOUNTER — Other Ambulatory Visit: Payer: Self-pay | Admitting: Internal Medicine

## 2013-05-15 ENCOUNTER — Encounter: Payer: Self-pay | Admitting: Internal Medicine

## 2013-05-15 ENCOUNTER — Other Ambulatory Visit: Payer: Self-pay | Admitting: *Deleted

## 2013-05-15 ENCOUNTER — Other Ambulatory Visit (INDEPENDENT_AMBULATORY_CARE_PROVIDER_SITE_OTHER): Payer: Medicare Other

## 2013-05-15 ENCOUNTER — Ambulatory Visit (INDEPENDENT_AMBULATORY_CARE_PROVIDER_SITE_OTHER): Payer: Medicare Other | Admitting: Internal Medicine

## 2013-05-15 VITALS — BP 114/80 | HR 72 | Temp 97.2°F | Resp 16 | Wt 180.0 lb

## 2013-05-15 DIAGNOSIS — R030 Elevated blood-pressure reading, without diagnosis of hypertension: Secondary | ICD-10-CM

## 2013-05-15 DIAGNOSIS — I10 Essential (primary) hypertension: Secondary | ICD-10-CM | POA: Diagnosis not present

## 2013-05-15 DIAGNOSIS — R7309 Other abnormal glucose: Secondary | ICD-10-CM | POA: Diagnosis not present

## 2013-05-15 DIAGNOSIS — R634 Abnormal weight loss: Secondary | ICD-10-CM

## 2013-05-15 DIAGNOSIS — N320 Bladder-neck obstruction: Secondary | ICD-10-CM

## 2013-05-15 DIAGNOSIS — Z23 Encounter for immunization: Secondary | ICD-10-CM

## 2013-05-15 LAB — BASIC METABOLIC PANEL
CO2: 30 mEq/L (ref 19–32)
Calcium: 9.9 mg/dL (ref 8.4–10.5)
Creatinine, Ser: 0.9 mg/dL (ref 0.4–1.5)
GFR: 90.66 mL/min (ref >60.00)
Sodium: 137 mEq/L (ref 135–145)

## 2013-05-15 LAB — PSA: PSA: 2.5 ng/mL (ref 0.10–4.00)

## 2013-05-15 LAB — TSH: TSH: 2.79 u[IU]/mL (ref 0.35–5.50)

## 2013-05-15 NOTE — Progress Notes (Signed)
Pre visit review using our clinic review tool, if applicable. No additional management support is needed unless otherwise documented below in the visit note. 

## 2013-05-15 NOTE — Assessment & Plan Note (Signed)
TSH 

## 2013-05-15 NOTE — Progress Notes (Signed)
Subjective:    HPI    The patient has been doing well overall without major physical or psychological issues going on lately. The patient presents for a follow-up of  chronic hypothyroidism controlled with medicines  Wt Readings from Last 3 Encounters:  05/15/13 180 lb (81.647 kg)  01/29/13 180 lb (81.647 kg)  11/12/12 178 lb (80.74 kg)   BP Readings from Last 3 Encounters:  05/15/13 114/80  01/29/13 120/80  11/12/12 104/72      Review of Systems  Constitutional: Negative for fever, activity change, appetite change, fatigue and unexpected weight change.  HENT: Negative for congestion, nosebleeds, sneezing, sore throat and trouble swallowing.   Eyes: Negative for redness, itching and visual disturbance.  Respiratory: Negative for apnea, cough, choking and shortness of breath.   Cardiovascular: Negative for chest pain, palpitations and leg swelling.  Gastrointestinal: Negative for nausea, diarrhea, blood in stool and abdominal distention.  Genitourinary: Negative for frequency and hematuria.  Musculoskeletal: Negative for back pain, gait problem, joint swelling and neck pain.  Skin: Negative for rash.  Neurological: Negative for dizziness, tremors, speech difficulty, weakness and light-headedness.  Hematological: Negative for adenopathy.  Psychiatric/Behavioral: Negative for sleep disturbance, dysphoric mood and agitation. The patient is not nervous/anxious.        Objective:   Physical Exam  Constitutional: He is oriented to person, place, and time. He appears well-developed and well-nourished. No distress.  HENT:  Head: Normocephalic and atraumatic.  Right Ear: External ear normal.  Left Ear: External ear normal.  Nose: Nose normal.  Mouth/Throat: Oropharynx is clear and moist. No oropharyngeal exudate.  Eyes: Conjunctivae and EOM are normal. Pupils are equal, round, and reactive to light. Right eye exhibits no discharge. Left eye exhibits no discharge. No scleral  icterus.  Neck: Normal range of motion. Neck supple. No JVD present. No tracheal deviation present. No thyromegaly present.  Cardiovascular: Normal rate, regular rhythm, normal heart sounds and intact distal pulses.  Exam reveals no gallop and no friction rub.   No murmur heard. Rare irreg beats  Pulmonary/Chest: Effort normal and breath sounds normal. No stridor. No respiratory distress. He has no wheezes. He has no rales. He exhibits no tenderness.  Abdominal: Soft. Bowel sounds are normal. He exhibits no distension and no mass. There is no tenderness. There is no rebound and no guarding.  Genitourinary: Rectum normal, prostate normal and penis normal. Guaiac negative stool. No penile tenderness.  Musculoskeletal: Normal range of motion. He exhibits no edema and no tenderness.  Lymphadenopathy:    He has no cervical adenopathy.  Neurological: He is alert and oriented to person, place, and time. He has normal reflexes. No cranial nerve deficit. He exhibits normal muscle tone. Coordination normal.  Skin: Skin is warm and dry. No rash noted. He is not diaphoretic. No erythema. No pallor.  Psychiatric: He has a normal mood and affect. His behavior is normal. Judgment and thought content normal.   EKG was compared to previous     Lab Results  Component Value Date   WBC 7.5 10/17/2012   HGB 14.0 10/17/2012   HCT 41.5 10/17/2012   PLT 247.0 10/17/2012   CHOL 120 10/17/2012   TRIG 63.0 10/17/2012   HDL 41.60 10/17/2012   ALT 17 11/06/2011   AST 20 11/06/2011   NA 136 11/13/2012   K 3.8 11/13/2012   CL 101 11/13/2012   CREATININE 1.0 11/13/2012   BUN 19 11/13/2012   CO2 29 11/13/2012   TSH 3.68  10/17/2012   PSA 3.58 11/13/2012   INR 1.0 RATIO 04/07/2008   HGBA1C 5.8 10/26/2010     Assessment & Plan:

## 2013-05-15 NOTE — Assessment & Plan Note (Signed)
Continue with current prescription therapy as reflected on the Med list.  

## 2013-05-15 NOTE — Assessment & Plan Note (Signed)
BMET 

## 2013-05-15 NOTE — Patient Instructions (Signed)
   Milk free trial (no milk, ice cream, cheese and yogurt) for 4-6 weeks. OK to use almond, coconut, rice or soy milk. "Almond breeze" brand tastes good.  

## 2013-09-03 ENCOUNTER — Other Ambulatory Visit: Payer: Self-pay | Admitting: Internal Medicine

## 2013-09-24 ENCOUNTER — Other Ambulatory Visit: Payer: Self-pay | Admitting: Internal Medicine

## 2013-10-30 ENCOUNTER — Other Ambulatory Visit: Payer: Self-pay | Admitting: Internal Medicine

## 2013-11-09 ENCOUNTER — Ambulatory Visit: Payer: Medicare Other | Admitting: Internal Medicine

## 2013-11-12 ENCOUNTER — Ambulatory Visit (INDEPENDENT_AMBULATORY_CARE_PROVIDER_SITE_OTHER): Payer: Medicare Other | Admitting: Sports Medicine

## 2013-11-12 ENCOUNTER — Encounter: Payer: Self-pay | Admitting: Sports Medicine

## 2013-11-12 ENCOUNTER — Other Ambulatory Visit: Payer: Self-pay | Admitting: Family Medicine

## 2013-11-12 ENCOUNTER — Ambulatory Visit
Admission: RE | Admit: 2013-11-12 | Discharge: 2013-11-12 | Disposition: A | Discharge status: Home / Self Care | Payer: Medicare Other | Source: Ambulatory Visit | Attending: Sports Medicine | Admitting: Sports Medicine

## 2013-11-12 VITALS — BP 131/81 | Ht 75.0 in | Wt 175.0 lb

## 2013-11-12 DIAGNOSIS — M19049 Primary osteoarthritis, unspecified hand: Secondary | ICD-10-CM | POA: Diagnosis not present

## 2013-11-12 DIAGNOSIS — M25569 Pain in unspecified knee: Secondary | ICD-10-CM | POA: Insufficient documentation

## 2013-11-12 DIAGNOSIS — M79646 Pain in unspecified finger(s): Secondary | ICD-10-CM | POA: Insufficient documentation

## 2013-11-12 DIAGNOSIS — M1710 Unilateral primary osteoarthritis, unspecified knee: Secondary | ICD-10-CM | POA: Diagnosis not present

## 2013-11-12 DIAGNOSIS — IMO0002 Reserved for concepts with insufficient information to code with codable children: Secondary | ICD-10-CM

## 2013-11-12 DIAGNOSIS — M79609 Pain in unspecified limb: Secondary | ICD-10-CM

## 2013-11-12 DIAGNOSIS — M1711 Unilateral primary osteoarthritis, right knee: Secondary | ICD-10-CM

## 2013-11-12 DIAGNOSIS — M189 Osteoarthritis of first carpometacarpal joint, unspecified: Secondary | ICD-10-CM

## 2013-11-13 DIAGNOSIS — M1710 Unilateral primary osteoarthritis, unspecified knee: Secondary | ICD-10-CM | POA: Insufficient documentation

## 2013-11-13 DIAGNOSIS — M189 Osteoarthritis of first carpometacarpal joint, unspecified: Secondary | ICD-10-CM | POA: Insufficient documentation

## 2013-11-13 DIAGNOSIS — M179 Osteoarthritis of knee, unspecified: Secondary | ICD-10-CM | POA: Insufficient documentation

## 2013-11-13 NOTE — Progress Notes (Signed)
   Subjective:    Patient ID: David Buck, male    DOB: May 21, 1941, 73 y.o.   MRN: [id number]  HPI chief complaint: Right thumb and right knee pain  Patient comes in today with returning right thumb and right knee pain. 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 He was last seen in the office back in March of 2014. Cortisone injections were administered to both the knee and the thumb. Both injections provided him with temporary symptom relief. Some injected lasted for about 6 months. Knee injection last a longer period. In regards to the right thumb, he continues to experience intermittent discomfort at the base of the thumb. Present mainly with activity such as gripping or twisting. No recent trauma. He takes occasional over-the-counter Advil which does seem to help. No numbness or tingling into the hand. His thumb pain is currently tolerable but what has brought him to the office today is his right knee. While walking around [place] last weekend he began to experience increasing discomfort and stiffness in the knee. No swelling. No mechanical symptoms. No feelings of instability. He does have a compression sleeve which she wears from time to time and it does seem to help. This knee has been treated not only with cortisone injections in the past also with Visco supplementation. He is here today with his wife.  Interim medical history reviewed Current medications reviewed Allergies reviewed    Review of Systems     Objective:   Physical Exam  Well-developed, well-nourished. No acute distress. Sitting comfortable in exam room. Vital signs reviewed.  Right thumb: Bony hypertrophy at the base of the thumb at the [facility] joint. No swelling. No tenderness to palpation. There is some limitation with active flexion and extension of the thumb. No malalignment. Mild pain with CMC grind. Neurovascularly intact distally.  Right knee: Full range of motion. No  effusion. Slight tenderness to palpation along the medial joint line but a negative McMurray's. No tenderness along the lateral joint line. Good ligamentous stability. Good alignment. Neurovascularly intact distally.  X-rays of the right thumb dated 11/12/2013 are reviewed. This includes an AP, lateral, and a CMC view. Patient has bone-on-bone osteoarthritis on the [facility] view. Nothing acute. X-rays of the right knee including AP and lateral views dated 11/12/2013 are also reviewed. He has moderately advanced medial compartmental DJD. He is nearly bone-on-bone on the lateral view.       Assessment & Plan:  1. Right thumb pain secondary to advanced CMC osteoarthritis 2. Right knee pain secondary to advanced medial compartmental DJD  Right knee is injected today with cortisone. This was accomplished using an anterior medial approach. Patient tolerated this without difficulty. He can continue on his over-the-counter Advil as needed. Continue with his compression sleeve as well. His thumb pain is currently tolerable. We can consider reinjecting this at a later date if needed. Followup when necessary.  Consent obtained and verified. Time-out conducted. Noted no overlying erythema, induration, or other signs of local infection. Skin prepped in a sterile fashion. Topical analgesic spray: Ethyl chloride. Joint: right knee Needle: 25g 1.5 inch Completed without difficulty. Meds: 3cc 1% xylocaine, 1cc (40mg ) depomedrol  Advised to call if fevers/chills, erythema, induration, drainage, or persistent bleeding.

## 2013-11-30 ENCOUNTER — Other Ambulatory Visit (INDEPENDENT_AMBULATORY_CARE_PROVIDER_SITE_OTHER): Payer: Medicare Other

## 2013-11-30 ENCOUNTER — Encounter: Payer: Self-pay | Admitting: Internal Medicine

## 2013-11-30 ENCOUNTER — Ambulatory Visit (INDEPENDENT_AMBULATORY_CARE_PROVIDER_SITE_OTHER): Payer: Medicare Other | Admitting: Internal Medicine

## 2013-11-30 VITALS — BP 112/78 | HR 68 | Temp 98.1°F | Resp 16 | Wt 176.0 lb

## 2013-11-30 DIAGNOSIS — R972 Elevated prostate specific antigen [PSA]: Secondary | ICD-10-CM

## 2013-11-30 DIAGNOSIS — Z0000 Encounter for general adult medical examination without abnormal findings: Secondary | ICD-10-CM | POA: Diagnosis not present

## 2013-11-30 DIAGNOSIS — R7309 Other abnormal glucose: Secondary | ICD-10-CM | POA: Diagnosis not present

## 2013-11-30 DIAGNOSIS — E785 Hyperlipidemia, unspecified: Secondary | ICD-10-CM

## 2013-11-30 DIAGNOSIS — M1990 Unspecified osteoarthritis, unspecified site: Secondary | ICD-10-CM

## 2013-11-30 LAB — BASIC METABOLIC PANEL
BUN: 23 mg/dL (ref 6–23)
CALCIUM: 10 mg/dL (ref 8.4–10.5)
CO2: 30 meq/L (ref 19–32)
Chloride: 100 mEq/L (ref 96–112)
Creatinine, Ser: 0.9 mg/dL (ref 0.4–1.5)
GFR: 85.99 mL/min (ref >60.00)
Glucose, Bld: 91 mg/dL (ref 70–99)
Potassium: 3.8 mEq/L (ref 3.5–5.1)
SODIUM: 136 meq/L (ref 135–145)

## 2013-11-30 LAB — PSA: PSA: 2.89 ng/mL (ref 0.10–4.00)

## 2013-11-30 NOTE — Assessment & Plan Note (Signed)
Labs

## 2013-11-30 NOTE — Progress Notes (Signed)
Pre visit review using our clinic review tool, if applicable. No additional management support is needed unless otherwise documented below in the visit note. 

## 2013-11-30 NOTE — Progress Notes (Signed)
Subjective:    HPI  C/o R knee pain - Dr Micheline Chapman gave an inj recently  The patient has been doing well overall without major physical or psychological issues going on lately. The patient presents for a follow-up of  chronic hypothyroidism controlled with medicines  Wt Readings from Last 3 Encounters:  11/30/13 176 lb (79.833 kg)  11/12/13 175 lb (79.379 kg)  05/15/13 180 lb (81.647 kg)   BP Readings from Last 3 Encounters:  11/30/13 112/78  11/12/13 131/81  05/15/13 114/80      Review of Systems  Constitutional: Negative for fever, activity change, appetite change, fatigue and unexpected weight change.  HENT: Negative for congestion, nosebleeds, sneezing, sore throat and trouble swallowing.   Eyes: Negative for redness, itching and visual disturbance.  Respiratory: Negative for apnea, cough, choking and shortness of breath.   Cardiovascular: Negative for chest pain, palpitations and leg swelling.  Gastrointestinal: Negative for nausea, diarrhea, blood in stool and abdominal distention.  Genitourinary: Negative for frequency and hematuria.  Musculoskeletal: Negative for back pain, gait problem, joint swelling and neck pain.  Skin: Negative for rash.  Neurological: Negative for dizziness, tremors, speech difficulty, weakness and light-headedness.  Hematological: Negative for adenopathy.  Psychiatric/Behavioral: Negative for sleep disturbance, dysphoric mood and agitation. The patient is not nervous/anxious.        Objective:   Physical Exam  Constitutional: He is oriented to person, place, and time. He appears well-developed and well-nourished. No distress.  HENT:  Head: Normocephalic and atraumatic.  Right Ear: External ear normal.  Left Ear: External ear normal.  Nose: Nose normal.  Mouth/Throat: Oropharynx is clear and moist. No oropharyngeal exudate.  Eyes: Conjunctivae and EOM are normal. Pupils are equal, round, and reactive to light. Right eye exhibits no  discharge. Left eye exhibits no discharge. No scleral icterus.  Neck: Normal range of motion. Neck supple. No JVD present. No tracheal deviation present. No thyromegaly present.  Cardiovascular: Normal rate, regular rhythm, normal heart sounds and intact distal pulses.  Exam reveals no gallop and no friction rub.   No murmur heard. Rare irreg beats  Pulmonary/Chest: Effort normal and breath sounds normal. No stridor. No respiratory distress. He has no wheezes. He has no rales. He exhibits no tenderness.  Abdominal: Soft. Bowel sounds are normal. He exhibits no distension and no mass. There is no tenderness. There is no rebound and no guarding.  Genitourinary: Rectum normal, prostate normal and penis normal. Guaiac negative stool. No penile tenderness.  Musculoskeletal: Normal range of motion. He exhibits no edema and no tenderness.  Lymphadenopathy:    He has no cervical adenopathy.  Neurological: He is alert and oriented to person, place, and time. He has normal reflexes. No cranial nerve deficit. He exhibits normal muscle tone. Coordination normal.  Skin: Skin is warm and dry. No rash noted. He is not diaphoretic. No erythema. No pallor.  Psychiatric: He has a normal mood and affect. His behavior is normal. Judgment and thought content normal.  B knees w/o effusion EKG was compared to previous     Lab Results  Component Value Date   WBC 7.5 10/17/2012   HGB 14.0 10/17/2012   HCT 41.5 10/17/2012   PLT 247.0 10/17/2012   CHOL 120 10/17/2012   TRIG 63.0 10/17/2012   HDL 41.60 10/17/2012   ALT 17 11/06/2011   AST 20 11/06/2011   NA 137 05/15/2013   K 4.5 05/15/2013   CL 101 05/15/2013   CREATININE 0.9 05/15/2013  BUN 19 05/15/2013   CO2 30 05/15/2013   TSH 2.79 05/15/2013   PSA 2.50 05/15/2013   INR 1.0 RATIO 04/07/2008   HGBA1C 5.8 10/26/2010     Assessment & Plan:

## 2013-11-30 NOTE — Assessment & Plan Note (Signed)
BMET 

## 2013-11-30 NOTE — Assessment & Plan Note (Signed)
Ibuprofen 400 mg po bid pc

## 2013-12-10 ENCOUNTER — Ambulatory Visit (INDEPENDENT_AMBULATORY_CARE_PROVIDER_SITE_OTHER): Payer: Medicare Other | Admitting: Sports Medicine

## 2013-12-10 ENCOUNTER — Encounter: Payer: Self-pay | Admitting: Sports Medicine

## 2013-12-10 VITALS — BP 114/80 | Ht 75.0 in | Wt 175.0 lb

## 2013-12-10 DIAGNOSIS — M1711 Unilateral primary osteoarthritis, right knee: Secondary | ICD-10-CM

## 2013-12-10 DIAGNOSIS — M1710 Unilateral primary osteoarthritis, unspecified knee: Secondary | ICD-10-CM

## 2013-12-10 DIAGNOSIS — G5700 Lesion of sciatic nerve, unspecified lower limb: Secondary | ICD-10-CM | POA: Diagnosis not present

## 2013-12-10 DIAGNOSIS — G5701 Lesion of sciatic nerve, right lower limb: Secondary | ICD-10-CM

## 2013-12-11 DIAGNOSIS — D239 Other benign neoplasm of skin, unspecified: Secondary | ICD-10-CM | POA: Diagnosis not present

## 2013-12-11 DIAGNOSIS — L738 Other specified follicular disorders: Secondary | ICD-10-CM | POA: Diagnosis not present

## 2013-12-11 DIAGNOSIS — L819 Disorder of pigmentation, unspecified: Secondary | ICD-10-CM | POA: Diagnosis not present

## 2013-12-11 DIAGNOSIS — L821 Other seborrheic keratosis: Secondary | ICD-10-CM | POA: Diagnosis not present

## 2013-12-11 DIAGNOSIS — D2320 Other benign neoplasm of skin of unspecified ear and external auricular canal: Secondary | ICD-10-CM | POA: Diagnosis not present

## 2013-12-11 DIAGNOSIS — D1801 Hemangioma of skin and subcutaneous tissue: Secondary | ICD-10-CM | POA: Diagnosis not present

## 2013-12-11 DIAGNOSIS — D235 Other benign neoplasm of skin of trunk: Secondary | ICD-10-CM | POA: Diagnosis not present

## 2013-12-11 NOTE — Progress Notes (Addendum)
   Subjective:    Patient ID: David Buck, male    DOB: 1941/03/29, 73 y.o.   MRN: [id number]  HPI Patient comes in today with persistent right knee pain. X-rays of the right knee done recently showed advancing medial compartmental DJD. Cortisone injection administered recently has not provided him much symptom relief. Main complaint is stiffness in the knee with prolonged sitting. Some occasional diffuse pain as well. No mechanical symptoms. No swelling. Some weakness and feeling of the knee wanting to give way. He has had success in the past with Visco supplementation and would like to consider repeating this. He is also complaining of some posterior right hip pain which he believes is contributed to some piriformis irritation. He has had similar issues in the past. He also has a history of lumbar degenerative disc disease per his report. Pain is worse with sitting. He gets occasional aching along the lateral lower leg on the right but denies numbness or tingling into his calf or foot. He has not tried any over-the-counter pain medication. He is here today with his wife.    Review of Systems     Objective:   Physical Exam Well-developed, well-nourished. No acute distress. Awake alert and oriented x3. Vital signs reviewed.  Right knee: Full range of motion. No effusion. Slight tenderness to palpation along the medial joint line. Negative McMurray's. Good joint stability.  Lumbar spine: Full range of motion. No pain with flexion or extension. He is tender to palpation in the right piriformis. No tenderness over the SI joint. Negative Faber. Reproducible pain with piriformis stretching. Negative straight leg raise bilaterally. Negative log roll bilaterally. Strength is 5/5 in both lower extremities. Reflexes are trace but equal at the Achilles and patellar tendons bilaterally. Sensation is intact to light touch grossly. Patient ambulates without a limp.  X-ray of the right knee is as  above       Assessment & Plan:  Right knee pain secondary to medial compartmental DJD Piriformis strain  Patient has done well with Visco supplementation in the past. I've asked the patient and his wife to contact their insurance company to see which form of Visco supplementation that they will approve. I would be happy to see him back in the office thereafter to start a new series of injections. I've given him some hamstring and quadriceps strengthening exercises and I've also given him some simple piriformis stretching exercises. There is a possibility that his posterior right hip pain is originating from his lumbar spine and if his symptoms persist or he develops symptoms more convincing of radiculopathy then I would start with updated x-rays of his lumbar spine.

## 2014-01-11 ENCOUNTER — Ambulatory Visit (INDEPENDENT_AMBULATORY_CARE_PROVIDER_SITE_OTHER): Payer: Medicare Other | Admitting: Sports Medicine

## 2014-01-11 ENCOUNTER — Encounter: Payer: Self-pay | Admitting: Sports Medicine

## 2014-01-11 VITALS — BP 112/80 | HR 77 | Ht 74.0 in | Wt 175.0 lb

## 2014-01-11 DIAGNOSIS — M1710 Unilateral primary osteoarthritis, unspecified knee: Secondary | ICD-10-CM | POA: Diagnosis not present

## 2014-01-11 DIAGNOSIS — M1711 Unilateral primary osteoarthritis, right knee: Secondary | ICD-10-CM

## 2014-01-11 MED ORDER — SODIUM HYALURONATE (VISCOSUP) 25 MG/2.5ML IX SOSY
25.0000 mL | PREFILLED_SYRINGE | Freq: Once | INTRA_ARTICULAR | Status: AC
  Administered 2014-01-11: 25 mL via INTRA_ARTICULAR

## 2014-01-11 NOTE — Progress Notes (Signed)
Patient ID: David Buck, male   DOB: 1941/03/13, 73 y.o.   MRN: [id number]  Patient comes in today to start another round of Visco supplementation. He has a documented history of right knee DJD. Recent x-rays showed moderately advanced medial compartmental DJD. Recent cortisone injection provided him with no real symptom relief. He has had good success with Visco supplementation in the past. We will inject his right knee with his first injection today. Patient will followup with me next week for his second injection. Call with questions or concerns in the interim.  Consent obtained and verified. Time-out conducted. Noted no overlying erythema, induration, or other signs of local infection. Skin prepped in a sterile fashion. Topical analgesic spray: Ethyl chloride. Joint: right knee, anterior medial  Needle: 22g 1.5 inch Completed without difficulty. Meds: 3cc 1% xylocaine followed by one vial of Supartz  Advised to call if fevers/chills, erythema, induration, drainage, or persistent bleeding.

## 2014-01-18 ENCOUNTER — Encounter: Payer: Self-pay | Admitting: Sports Medicine

## 2014-01-18 ENCOUNTER — Ambulatory Visit (INDEPENDENT_AMBULATORY_CARE_PROVIDER_SITE_OTHER): Payer: Medicare Other | Admitting: Sports Medicine

## 2014-01-18 VITALS — BP 100/60 | Ht 74.0 in | Wt 175.0 lb

## 2014-01-18 DIAGNOSIS — M1710 Unilateral primary osteoarthritis, unspecified knee: Secondary | ICD-10-CM

## 2014-01-18 DIAGNOSIS — M1711 Unilateral primary osteoarthritis, right knee: Secondary | ICD-10-CM

## 2014-01-18 MED ORDER — SODIUM HYALURONATE (VISCOSUP) 25 MG/2.5ML IX SOSY
2.5000 mL | PREFILLED_SYRINGE | Freq: Once | INTRA_ARTICULAR | Status: AC
  Administered 2014-01-18: 2.5 mL via INTRA_ARTICULAR

## 2014-01-18 NOTE — Progress Notes (Signed)
Patient ID: David Buck, male   DOB: 11-Jun-1941, 73 y.o.   MRN: [id number]  Patient comes in today for his second Supartz injection into his right knee. Tolerated the first injection without any difficulty. Second injection was accomplished atraumatically under sterile technique. Patient tolerated it without difficulty. Return to the office next week for his third injection.  Consent obtained and verified. Time-out conducted. Noted no overlying erythema, induration, or other signs of local infection. Skin prepped in a sterile fashion. Topical analgesic spray: Ethyl chloride. Joint: right knee, anterior medial approach Needle: 22g 1.5 inch Completed without difficulty. Meds: 3cc 1% xylocaine followed by 1 vial of supartz   Advised to call if fevers/chills, erythema, induration, drainage, or persistent bleeding.

## 2014-01-25 ENCOUNTER — Ambulatory Visit (INDEPENDENT_AMBULATORY_CARE_PROVIDER_SITE_OTHER): Payer: Medicare Other | Admitting: Sports Medicine

## 2014-01-25 ENCOUNTER — Encounter: Payer: Self-pay | Admitting: Sports Medicine

## 2014-01-25 VITALS — BP 130/76 | HR 73 | Ht 74.0 in | Wt 175.0 lb

## 2014-01-25 DIAGNOSIS — M1710 Unilateral primary osteoarthritis, unspecified knee: Secondary | ICD-10-CM | POA: Diagnosis not present

## 2014-01-25 DIAGNOSIS — M1711 Unilateral primary osteoarthritis, right knee: Secondary | ICD-10-CM

## 2014-01-25 MED ORDER — SODIUM HYALURONATE (VISCOSUP) 25 MG/2.5ML IX SOSY: 2.5000 mL | PREFILLED_SYRINGE | Freq: Once | INTRA_ARTICULAR | Status: DC

## 2014-01-25 MED ORDER — SODIUM HYALURONATE (VISCOSUP) 25 MG/2.5ML IX SOSY
2.5000 mL | PREFILLED_SYRINGE | Freq: Once | INTRA_ARTICULAR | Status: AC
  Administered 2014-01-25: 2.5 mL via INTRA_ARTICULAR

## 2014-01-25 NOTE — Progress Notes (Signed)
Patient ID: David Buck, male   DOB: 1940/08/23, 73 y.o.   MRN: [id number]  Patient comes in today for his third Supartz injection into his right knee. Tolerated the first 2 without difficulty. He believes that he is starting to see a benefit from these injections. He has no concerns. After today's injection we will schedule a followup appointment in 4 weeks. We will decide whether or not to pursue his final 2 injections based on his response to the initial 3.  Consent obtained and verified. Time-out conducted. Noted no overlying erythema, induration, or other signs of local infection. Skin prepped in a sterile fashion. Topical analgesic spray: Ethyl chloride. Joint: right knee, anterior medial approach Needle: 22g 1.5 inch Completed without difficulty. Meds: 3cc 1% xylocaine followed by one vial of Supartz  Advised to call if fevers/chills, erythema, induration, drainage, or persistent bleeding.

## 2014-01-29 ENCOUNTER — Ambulatory Visit: Payer: Medicare Other | Admitting: Internal Medicine

## 2014-02-01 ENCOUNTER — Ambulatory Visit (INDEPENDENT_AMBULATORY_CARE_PROVIDER_SITE_OTHER): Payer: Medicare Other | Admitting: Internal Medicine

## 2014-02-01 ENCOUNTER — Encounter: Payer: Self-pay | Admitting: Internal Medicine

## 2014-02-01 ENCOUNTER — Ambulatory Visit (INDEPENDENT_AMBULATORY_CARE_PROVIDER_SITE_OTHER)
Admission: RE | Admit: 2014-02-01 | Discharge: 2014-02-01 | Disposition: A | Discharge status: Home / Self Care | Payer: Medicare Other | Source: Ambulatory Visit | Attending: Internal Medicine | Admitting: Internal Medicine

## 2014-02-01 VITALS — BP 116/74 | HR 65 | Ht 74.0 in | Wt 176.0 lb

## 2014-02-01 DIAGNOSIS — J309 Allergic rhinitis, unspecified: Secondary | ICD-10-CM

## 2014-02-01 DIAGNOSIS — F17200 Nicotine dependence, unspecified, uncomplicated: Secondary | ICD-10-CM

## 2014-02-01 DIAGNOSIS — J984 Other disorders of lung: Secondary | ICD-10-CM | POA: Diagnosis not present

## 2014-02-01 DIAGNOSIS — J302 Other seasonal allergic rhinitis: Secondary | ICD-10-CM

## 2014-02-01 DIAGNOSIS — R911 Solitary pulmonary nodule: Secondary | ICD-10-CM

## 2014-02-01 DIAGNOSIS — J438 Other emphysema: Secondary | ICD-10-CM | POA: Diagnosis not present

## 2014-02-01 DIAGNOSIS — J3089 Other allergic rhinitis: Secondary | ICD-10-CM

## 2014-02-01 DIAGNOSIS — J439 Emphysema, unspecified: Secondary | ICD-10-CM

## 2014-02-01 DIAGNOSIS — J449 Chronic obstructive pulmonary disease, unspecified: Secondary | ICD-10-CM | POA: Diagnosis not present

## 2014-02-01 NOTE — Assessment & Plan Note (Signed)
Barely visible nodule right upper lung zone. Plan-continue long-term surveillance with annual chest x-ray.

## 2014-02-01 NOTE — Assessment & Plan Note (Signed)
Counseled, offered help

## 2014-02-01 NOTE — Assessment & Plan Note (Addendum)
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 Plan-chest x-r,

## 2014-02-01 NOTE — Progress Notes (Signed)
Subjective:    Patient ID: David Buck, male    DOB: [birth year], 73 y.o.   MRN: [id number]  HPI 01/29/11- 60 yoM 1 PPD smoker followed for small lung nodules/ RUL nodular scarring/ benign bronchoscopy 04/14/08- neg AFB.   Wife here        Last here 01/30/2010 CXR- 01/30/10- COPD with stable scarring, NAD.  Aware of some pn drip- could take antihistamine, but doesn't bother him enough to need to. Denies productive cough, night sweats, weight loss, fever, nodes..  Some cough, but no real change in pattern  01/29/12- 21 yoM 1 PPD smoker followed for small lung nodules/ RUL nodular scarring/ benign bronchoscopy 04/14/08- neg AFB. Doing well overall unless more active (gets carried away with it).  Wife here He is no more inclined than before, to stop smoking. His wife's feelings about the issue are clear. He denies any change in his breathing. We had a sustained conversation about atypical AFB. He describes some bland cough and blames allergy for postnasal drip. These Dr. Wilburn Cornelia had offered to repair septal deviation but patient refused at this time. He is using one half tab Claritin. Denies headache or discolored mucus and has no history of glaucoma. COPD assessment test (CAT) score 6/40. CXR 01/29/11- discussed with them. 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  Original Report Authenticated By: Dereck Ligas, M.D.   01/29/13- 70 yoM 1 PPD smoker followed for small lung nodules/ RUL nodular scarring/ benign bronchoscopy 04/14/08- neg AFB. Wife here Yearly follow up.  Breathing doing well overall.  No SOB, wheezing, chest tightness, chest pain, or cough at this time. Still smoking. He does ask this time about using E.-cigarettes. We discussed smoking cessation  01/24/14- 73 yoM 1 PPD smoker followed for small lung nodules/ RUL nodular scarring/ benign bronchoscopy 04/14/08- neg AFB. Daughter Administrator, Civil Service) here FOLLOWS FOR: Pt has no breathing problems to report at this  time.  Continues smoking 1 pack per day. We discussed smoking cessation and electronic cigarettes again. Uses one loratadine daily-wears off after about 20 hours. Some postnasal drip. We reviewed images of his last chest x-ray CXR 01/30/13 IMPRESSION:  Tiny nodularity versus vessel and in the right upper lobe stable  compared to prior exam of January 29, 2011. 2-year follow-up has  been established.  No acute cardiopulmonary disease identified. COPD.  Original Report Authenticated By: Abelardo Diesel, M.D. 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 Emphysema pattern developing in flow volume loop.  ROS-see HPI Constitutional:   No-   weight loss, night sweats, fevers, chills, fatigue, lassitude. HEENT:   No-  headaches, difficulty swallowing, tooth/dental problems, sore throat,       No-  sneezing, itching, ear ache, nasal congestion, +post nasal drip,  CV:  No-   chest pain, orthopnea, PND, swelling in lower extremities, anasarca, dizziness, palpitations Resp: No-   shortness of breath with exertion or at rest.              No-   productive cough,  No- non-productive cough,  No- coughing up of blood.              No-   change in color of mucus.  No- wheezing.   Skin: No-   rash or lesions. GI:  No-   heartburn, indigestion, abdominal pain, nausea, vomiting,  GU:  MS:  No-   joint pain or swelling.  Neuro-     nothing unusual Psych:  No- change in mood  or affect. No depression or anxiety.  No memory loss.  Objective:   Physical Exam General- Alert, Oriented, Affect-appropriate, Distress- none acute. tall slender man Skin- rash-none, lesions- none, excoriation- none Lymphadenopathy- none Head- atraumatic            Eyes- Gross vision intact, PERRLA, conjunctivae clear secretions            Ears- Hearing, canals normal            Nose- Clear, No- Septal dev, +mucus postnasal drip, polyps, erosion, perforation             Throat- Mallampati  II , mucosa clear , drainage- none, tonsils- atrophic  dentures Neck- flexible , trachea midline, no stridor , thyroid nl, carotid no bruit Chest - symmetrical excursion , unlabored           Heart/CV- RRR , no murmur , no gallop  , no rub, nl s1 s2                           - JVD- none , edema- none, stasis changes- none, varices- none           Lung- +coarse breath sounds, unlabored, wheeze- none, cough- none , dullness-none,                      rub- none           Chest wall-  Abd-  Br/ Gen/ Rectal- Not done, not indicated Extrem- cyanosis- none, clubbing, none, atrophy- none, strength- nl Neuro- grossly intact to observation    Assessment & Plan:

## 2014-02-01 NOTE — Patient Instructions (Signed)
Order- Office Spirometry  Dx COPD with emphysema  Order-- CXR   Dx lung nodule\   Ok to use any antihistamine that works for you- most now prefer claritin, zyrtec or allegra. Try skipping from time to time to see if you really need it.  Please keep open the possibility of stopping smoking, before smoking stops you !

## 2014-02-01 NOTE — Assessment & Plan Note (Signed)
Plan-okay to use any antihistamine that works and to skip if not needed

## 2014-02-03 ENCOUNTER — Telehealth: Payer: Self-pay | Admitting: Internal Medicine

## 2014-02-03 NOTE — Telephone Encounter (Signed)
Per 8.10.15 cxr result: Result Notes    Notes Recorded by Elie Confer, CMA on 02/03/2014 at 11:31 AM [id number] x 1 ------  Notes Recorded by Deneise Lever, MD on 02/01/2014 at 9:02 PM CXR- stable RUL nodule since 2012 meets criteria to be considered benign.Lungs are overinflated, indicating early emphysema.     Called spoke with patient's daughter Cristin (dpr on file).  She reported that pt had viewed this on mychart; results reviewed with Cristin again.  No questions/concerns per Cristin.  Will sign off.

## 2014-02-03 NOTE — Progress Notes (Signed)
Quick Note:  Pt's daughter Cristin aware per 8.12.15 phone note ______

## 2014-02-10 DIAGNOSIS — H40019 Open angle with borderline findings, low risk, unspecified eye: Secondary | ICD-10-CM | POA: Diagnosis not present

## 2014-02-22 ENCOUNTER — Ambulatory Visit: Payer: Medicare Other | Admitting: Sports Medicine

## 2014-02-23 ENCOUNTER — Ambulatory Visit (INDEPENDENT_AMBULATORY_CARE_PROVIDER_SITE_OTHER): Payer: Medicare Other | Admitting: Sports Medicine

## 2014-02-23 ENCOUNTER — Encounter: Payer: Self-pay | Admitting: Sports Medicine

## 2014-02-23 VITALS — Ht 74.5 in | Wt 175.0 lb

## 2014-02-23 DIAGNOSIS — M1710 Unilateral primary osteoarthritis, unspecified knee: Secondary | ICD-10-CM

## 2014-02-23 DIAGNOSIS — M1711 Unilateral primary osteoarthritis, right knee: Secondary | ICD-10-CM

## 2014-02-23 NOTE — Progress Notes (Signed)
Patient ID: David Buck, male   DOB: 10-Jan-1941, 73 y.o.   MRN: [id number]  Patient comes in today for his fourth Supartz injection. Overall, he feels like his knee is improving. He would like to proceed with the fourth injection today. He is also requesting a compression sleeve. He has one that he has been using but it is quite worn. He does find it to be helpful.  Patient's knee was injected today with his fourth Supartz injection. There is a small patch of possible eczema at the usual entry point of the anterior medial knee so I elected to utilize an anterior lateral approach today. He tolerated this without difficulty. We will also fit him with a body helix compression sleeve. We will tentatively schedule an appointment for his fifth and final injection next week. If his knee pain continues to improve he may decide to skip his final injection.  Consent obtained and verified. Time-out conducted. Noted no overlying erythema, induration, or other signs of local infection. Skin prepped in a sterile fashion. Topical analgesic spray: Ethyl chloride. Joint: right knee Needle: 22g 1.5 inch Completed without difficulty. Meds: 3cc 1% xylocaine followed by one vial of Supartz  Advised to call if fevers/chills, erythema, induration, drainage, or persistent bleeding.

## 2014-02-23 NOTE — Progress Notes (Signed)
[place] R KNEE  LOT [id number] EXP 12/22/2016

## 2014-02-26 ENCOUNTER — Encounter: Payer: Self-pay | Admitting: Internal Medicine

## 2014-03-03 ENCOUNTER — Ambulatory Visit (INDEPENDENT_AMBULATORY_CARE_PROVIDER_SITE_OTHER): Payer: Medicare Other | Admitting: Sports Medicine

## 2014-03-03 ENCOUNTER — Encounter: Payer: Self-pay | Admitting: Sports Medicine

## 2014-03-03 VITALS — BP 117/76 | Ht 74.5 in | Wt 175.0 lb

## 2014-03-03 DIAGNOSIS — M1710 Unilateral primary osteoarthritis, unspecified knee: Secondary | ICD-10-CM

## 2014-03-03 DIAGNOSIS — M1711 Unilateral primary osteoarthritis, right knee: Secondary | ICD-10-CM

## 2014-03-04 NOTE — Progress Notes (Signed)
Patient ID: David Buck, male   DOB: 1941/05/24, 73 y.o.   MRN: [id number]  Patient comes in today for his fifth and final Supartz injection. Doing well. Still having knee pain. Fifth injection was administered using an anterior medial approach. Patient tolerated this without difficulty. Hopefully he will get some prolonged relief with this. We briefly talked about the possibility of a medial unloader brace. Definitive treatment is a knee arthroplasty, but he may be a candidate for a unicompartmental replacement instead of a total knee arthroplasty. Followup when necessary.  Consent obtained and verified. Time-out conducted. Noted no overlying erythema, induration, or other signs of local infection. Skin prepped in a sterile fashion. Topical analgesic spray: Ethyl chloride. Joint: right knee Needle: 22g 1.5 inch Completed without difficulty. Meds: 3cc 1% xylocaine followed by one vial of Supartz  Advised to call if fevers/chills, erythema, induration, drainage, or persistent bleeding.

## 2014-04-09 ENCOUNTER — Ambulatory Visit (INDEPENDENT_AMBULATORY_CARE_PROVIDER_SITE_OTHER): Payer: Medicare Other

## 2014-04-09 DIAGNOSIS — Z23 Encounter for immunization: Secondary | ICD-10-CM | POA: Diagnosis not present

## 2014-06-01 ENCOUNTER — Encounter: Payer: Self-pay | Admitting: Internal Medicine

## 2014-06-01 ENCOUNTER — Ambulatory Visit (INDEPENDENT_AMBULATORY_CARE_PROVIDER_SITE_OTHER): Payer: Medicare Other | Admitting: Internal Medicine

## 2014-06-01 VITALS — BP 120/68 | HR 70 | Temp 98.0°F | Resp 16 | Ht 74.5 in | Wt 175.0 lb

## 2014-06-01 DIAGNOSIS — R634 Abnormal weight loss: Secondary | ICD-10-CM | POA: Diagnosis not present

## 2014-06-01 DIAGNOSIS — I10 Essential (primary) hypertension: Secondary | ICD-10-CM

## 2014-06-01 DIAGNOSIS — R972 Elevated prostate specific antigen [PSA]: Secondary | ICD-10-CM

## 2014-06-01 DIAGNOSIS — M79644 Pain in right finger(s): Secondary | ICD-10-CM | POA: Diagnosis not present

## 2014-06-01 MED ORDER — HYDROCHLOROTHIAZIDE 12.5 MG PO CAPS: ORAL_CAPSULE | ORAL | Status: DC

## 2014-06-01 MED ORDER — LEVOTHYROXINE SODIUM 75 MCG PO TABS: 75.0000 ug | ORAL_TABLET | Freq: Every day | ORAL | Status: DC

## 2014-06-01 NOTE — Assessment & Plan Note (Signed)
Free PSA

## 2014-06-01 NOTE — Assessment & Plan Note (Signed)
R OA

## 2014-06-01 NOTE — Assessment & Plan Note (Signed)
Continue with current prescription therapy as reflected on the Med list.  

## 2014-06-01 NOTE — Progress Notes (Signed)
   Subjective:    HPI  C/o R knee pain - Dr Micheline Chapman gave an inj recently C/o R thumb base pain - chronic  The patient has been doing well overall without major physical or psychological issues going on lately. The patient presents for a follow-up of  chronic hypothyroidism controlled with medicines  Wt Readings from Last 3 Encounters:  06/01/14 175 lb (79.379 kg)  03/03/14 175 lb (79.379 kg)  02/23/14 175 lb (79.379 kg)   BP Readings from Last 3 Encounters:  06/01/14 120/68  03/03/14 117/76  02/01/14 116/74      Review of Systems  Constitutional: Negative for fever, activity change, appetite change, fatigue and unexpected weight change.  HENT: Negative for congestion, nosebleeds, sneezing, sore throat and trouble swallowing.   Eyes: Negative for redness, itching and visual disturbance.  Respiratory: Negative for apnea, cough, choking and shortness of breath.   Cardiovascular: Negative for chest pain, palpitations and leg swelling.  Gastrointestinal: Negative for nausea, diarrhea, blood in stool and abdominal distention.  Genitourinary: Negative for frequency and hematuria.  Musculoskeletal: Negative for back pain, joint swelling, gait problem and neck pain.  Skin: Negative for rash.  Neurological: Negative for dizziness, tremors, speech difficulty, weakness and light-headedness.  Hematological: Negative for adenopathy.  Psychiatric/Behavioral: Negative for sleep disturbance, dysphoric mood and agitation. The patient is not nervous/anxious.        Objective:   Physical Exam  Constitutional: He is oriented to person, place, and time. He appears well-developed. No distress.  NAD  HENT:  Mouth/Throat: Oropharynx is clear and moist.  Eyes: Conjunctivae are normal. Pupils are equal, round, and reactive to light.  Neck: Normal range of motion. No JVD present. No thyromegaly present.  Cardiovascular: Normal rate, regular rhythm, normal heart sounds and intact distal pulses.   Exam reveals no gallop and no friction rub.   No murmur heard. Pulmonary/Chest: Effort normal and breath sounds normal. No respiratory distress. He has no wheezes. He has no rales. He exhibits no tenderness.  Abdominal: Soft. Bowel sounds are normal. He exhibits no distension and no mass. There is no tenderness. There is no rebound and no guarding.  Musculoskeletal: Normal range of motion. He exhibits no edema or tenderness.  Lymphadenopathy:    He has no cervical adenopathy.  Neurological: He is alert and oriented to person, place, and time. He has normal reflexes. No cranial nerve deficit. He exhibits normal muscle tone. He displays a negative Romberg sign. Coordination and gait normal.  No meningeal signs  Skin: Skin is warm and dry. No rash noted.  Psychiatric: He has a normal mood and affect. His behavior is normal. Judgment and thought content normal.  B knees w/o effusion EKG was compared to previous     Lab Results  Component Value Date   WBC 7.5 10/17/2012   HGB 14.0 10/17/2012   HCT 41.5 10/17/2012   PLT 247.0 10/17/2012   CHOL 120 10/17/2012   TRIG 63.0 10/17/2012   HDL 41.60 10/17/2012   ALT 17 11/06/2011   AST 20 11/06/2011   NA 136 11/30/2013   K 3.8 11/30/2013   CL 100 11/30/2013   CREATININE 0.9 11/30/2013   BUN 23 11/30/2013   CO2 30 11/30/2013   TSH 2.79 05/15/2013   PSA 2.89 11/30/2013   INR 1.0 RATIO 04/07/2008   HGBA1C 5.8 10/26/2010     Assessment & Plan:

## 2014-06-01 NOTE — Progress Notes (Signed)
Pre visit review using our clinic review tool, if applicable. No additional management support is needed unless otherwise documented below in the visit note. 

## 2014-06-01 NOTE — Addendum Note (Signed)
Addended by: Cassandria Anger on: 06/01/2014 02:39 PM   Modules accepted: Orders

## 2014-06-01 NOTE — Assessment & Plan Note (Signed)
Wt Readings from Last 3 Encounters:  06/01/14 175 lb (79.379 kg)  03/03/14 175 lb (79.379 kg)  02/23/14 175 lb (79.379 kg)

## 2014-06-02 ENCOUNTER — Telehealth: Payer: Self-pay | Admitting: Internal Medicine

## 2014-06-02 NOTE — Telephone Encounter (Signed)
emmi emailed °

## 2014-06-03 ENCOUNTER — Telehealth: Payer: Self-pay | Admitting: Internal Medicine

## 2014-06-03 NOTE — Telephone Encounter (Signed)
emmi emailed °

## 2014-06-04 ENCOUNTER — Other Ambulatory Visit (INDEPENDENT_AMBULATORY_CARE_PROVIDER_SITE_OTHER): Payer: Medicare Other

## 2014-06-04 DIAGNOSIS — M1990 Unspecified osteoarthritis, unspecified site: Secondary | ICD-10-CM

## 2014-06-04 DIAGNOSIS — Z0000 Encounter for general adult medical examination without abnormal findings: Secondary | ICD-10-CM | POA: Diagnosis not present

## 2014-06-04 DIAGNOSIS — R972 Elevated prostate specific antigen [PSA]: Secondary | ICD-10-CM

## 2014-06-04 DIAGNOSIS — R7309 Other abnormal glucose: Secondary | ICD-10-CM | POA: Diagnosis not present

## 2014-06-04 DIAGNOSIS — E785 Hyperlipidemia, unspecified: Secondary | ICD-10-CM

## 2014-06-04 LAB — BASIC METABOLIC PANEL
BUN: 25 mg/dL — AB (ref 6–23)
CALCIUM: 9.7 mg/dL (ref 8.4–10.5)
CHLORIDE: 105 meq/L (ref 96–112)
CO2: 26 mEq/L (ref 19–32)
CREATININE: 0.9 mg/dL (ref 0.4–1.5)
GFR: 88.07 mL/min (ref >60.00)
Glucose, Bld: 93 mg/dL (ref 70–99)
Potassium: 3.9 mEq/L (ref 3.5–5.1)
Sodium: 136 mEq/L (ref 135–145)

## 2014-06-04 LAB — CBC WITH DIFFERENTIAL/PLATELET
BASOS PCT: 0.8 % (ref 0.0–3.0)
Basophils Absolute: 0 10*3/uL (ref 0.0–0.1)
Eosinophils Absolute: 0.4 10*3/uL (ref 0.0–0.7)
Eosinophils Relative: 6.8 % — ABNORMAL HIGH (ref 0.0–5.0)
HEMATOCRIT: 40.8 % (ref 39.0–52.0)
HEMOGLOBIN: 13.7 g/dL (ref 13.0–17.0)
Lymphocytes Relative: 22 % (ref 12.0–46.0)
Lymphs Abs: 1.4 10*3/uL (ref 0.7–4.0)
MCHC: 33.5 g/dL (ref 30.0–36.0)
MCV: 93.7 fl (ref 78.0–100.0)
Monocytes Absolute: 0.6 10*3/uL (ref 0.1–1.0)
Monocytes Relative: 8.9 % (ref 3.0–12.0)
NEUTROS ABS: 4 10*3/uL (ref 1.4–7.7)
Neutrophils Relative %: 61.5 % (ref 43.0–77.0)
Platelets: 250 10*3/uL (ref 150.0–400.0)
RBC: 4.36 Mil/uL (ref 4.22–5.81)
RDW: 12.8 % (ref 11.5–15.5)
WBC: 6.5 10*3/uL (ref 4.0–10.5)

## 2014-06-04 LAB — HEPATIC FUNCTION PANEL
ALK PHOS: 65 U/L (ref 39–117)
ALT: 17 U/L (ref 0–53)
AST: 20 U/L (ref 0–37)
Albumin: 4.1 g/dL (ref 3.5–5.2)
BILIRUBIN TOTAL: 1 mg/dL (ref 0.2–1.2)
Bilirubin, Direct: 0.1 mg/dL (ref 0.0–0.3)
TOTAL PROTEIN: 6.9 g/dL (ref 6.0–8.3)

## 2014-06-04 LAB — URINALYSIS, ROUTINE W REFLEX MICROSCOPIC
Bilirubin Urine: NEGATIVE
Ketones, ur: NEGATIVE
Leukocytes, UA: NEGATIVE
NITRITE: NEGATIVE
PH: 6.5 (ref 5.0–8.0)
Specific Gravity, Urine: 1.015 (ref 1.000–1.030)
TOTAL PROTEIN, URINE-UPE24: NEGATIVE
URINE GLUCOSE: NEGATIVE
Urobilinogen, UA: 1 (ref 0.0–1.0)

## 2014-06-04 LAB — LIPID PANEL
CHOL/HDL RATIO: 2
CHOLESTEROL: 119 mg/dL (ref 0–200)
HDL: 48.1 mg/dL (ref >39.00)
LDL CALC: 63 mg/dL (ref 0–99)
NonHDL: 70.9
Triglycerides: 41 mg/dL (ref 0.0–149.0)
VLDL: 8.2 mg/dL (ref 0.0–40.0)

## 2014-06-04 LAB — TSH: TSH: 4.16 u[IU]/mL (ref 0.35–4.50)

## 2014-06-05 LAB — PSA, TOTAL AND FREE
PSA FREE: 0.48 ng/mL
PSA, Free Pct: 18 % — ABNORMAL LOW (ref >25)
PSA: 2.64 ng/mL (ref <=4.00)

## 2014-06-08 ENCOUNTER — Encounter: Payer: Self-pay | Admitting: Internal Medicine

## 2014-07-22 ENCOUNTER — Other Ambulatory Visit: Payer: Self-pay | Admitting: Internal Medicine

## 2014-08-02 DIAGNOSIS — N400 Enlarged prostate without lower urinary tract symptoms: Secondary | ICD-10-CM | POA: Diagnosis not present

## 2014-08-02 DIAGNOSIS — R972 Elevated prostate specific antigen [PSA]: Secondary | ICD-10-CM | POA: Diagnosis not present

## 2014-09-03 ENCOUNTER — Other Ambulatory Visit: Payer: Self-pay | Admitting: Internal Medicine

## 2014-10-18 ENCOUNTER — Ambulatory Visit: Payer: Medicare Other | Admitting: Sports Medicine

## 2014-10-20 ENCOUNTER — Encounter: Payer: Self-pay | Admitting: Internal Medicine

## 2014-10-20 ENCOUNTER — Ambulatory Visit (INDEPENDENT_AMBULATORY_CARE_PROVIDER_SITE_OTHER): Payer: Medicare Other | Admitting: Internal Medicine

## 2014-10-20 VITALS — BP 130/84 | HR 75 | Wt 174.0 lb

## 2014-10-20 DIAGNOSIS — R42 Dizziness and giddiness: Secondary | ICD-10-CM | POA: Diagnosis not present

## 2014-10-20 DIAGNOSIS — H833X3 Noise effects on inner ear, bilateral: Secondary | ICD-10-CM

## 2014-10-20 DIAGNOSIS — I10 Essential (primary) hypertension: Secondary | ICD-10-CM | POA: Diagnosis not present

## 2014-10-20 DIAGNOSIS — H833X9 Noise effects on inner ear, unspecified ear: Secondary | ICD-10-CM | POA: Insufficient documentation

## 2014-10-20 NOTE — Assessment & Plan Note (Signed)
BP Readings from Last 3 Encounters:  10/20/14 130/84  06/01/14 120/68  03/03/14 117/76

## 2014-10-20 NOTE — Patient Instructions (Signed)

## 2014-10-20 NOTE — Assessment & Plan Note (Signed)
ENT consult

## 2014-10-20 NOTE — Progress Notes (Signed)
   Subjective:    HPI  C/o a dizzy spell that lasted 30 min - last Thur in pm; spinning, lightheaded. C/o some hearing loss and occ ringing in the ears.   The patient presents for a follow-up of  chronic hypothyroidism controlled with medicines  Wt Readings from Last 3 Encounters:  10/20/14 174 lb (78.926 kg)  06/01/14 175 lb (79.379 kg)  03/03/14 175 lb (79.379 kg)   BP Readings from Last 3 Encounters:  10/20/14 130/84  06/01/14 120/68  03/03/14 117/76      Review of Systems  Constitutional: Negative for fever, activity change, appetite change, fatigue and unexpected weight change.  HENT: Negative for congestion, nosebleeds, sneezing, sore throat and trouble swallowing.   Eyes: Negative for redness, itching and visual disturbance.  Respiratory: Negative for apnea, cough, choking and shortness of breath.   Cardiovascular: Negative for chest pain, palpitations and leg swelling.  Gastrointestinal: Negative for nausea, diarrhea, blood in stool and abdominal distention.  Genitourinary: Negative for frequency and hematuria.  Musculoskeletal: Negative for back pain, joint swelling, gait problem and neck pain.  Skin: Negative for rash.  Neurological: Negative for dizziness, tremors, speech difficulty, weakness and light-headedness.  Hematological: Negative for adenopathy.  Psychiatric/Behavioral: Negative for sleep disturbance, dysphoric mood and agitation. The patient is not nervous/anxious.        Objective:   Physical Exam  Constitutional: He is oriented to person, place, and time. He appears well-developed. No distress.  NAD  HENT:  Mouth/Throat: Oropharynx is clear and moist.  Eyes: Conjunctivae are normal. Pupils are equal, round, and reactive to light.  Neck: Normal range of motion. No JVD present. No thyromegaly present.  Cardiovascular: Normal rate, regular rhythm, normal heart sounds and intact distal pulses.  Exam reveals no gallop and no friction rub.   No murmur  heard. Pulmonary/Chest: Effort normal and breath sounds normal. No respiratory distress. He has no wheezes. He has no rales. He exhibits no tenderness.  Abdominal: Soft. Bowel sounds are normal. He exhibits no distension and no mass. There is no tenderness. There is no rebound and no guarding.  Musculoskeletal: Normal range of motion. He exhibits no edema or tenderness.  Lymphadenopathy:    He has no cervical adenopathy.  Neurological: He is alert and oriented to person, place, and time. He has normal reflexes. No cranial nerve deficit. He exhibits normal muscle tone. He displays a negative Romberg sign. Coordination and gait normal.  No meningeal signs  Skin: Skin is warm and dry. No rash noted.  Psychiatric: He has a normal mood and affect. His behavior is normal. Judgment and thought content normal.  B knees w/o effusion Slightly ataxic (chronic) EKG was compared to previous H-P (-) B     Lab Results  Component Value Date   WBC 6.5 06/04/2014   HGB 13.7 06/04/2014   HCT 40.8 06/04/2014   PLT 250.0 06/04/2014   CHOL 119 06/04/2014   TRIG 41.0 06/04/2014   HDL 48.10 06/04/2014   ALT 17 06/04/2014   AST 20 06/04/2014   NA 136 06/04/2014   K 3.9 06/04/2014   CL 105 06/04/2014   CREATININE 0.9 06/04/2014   BUN 25* 06/04/2014   CO2 26 06/04/2014   TSH 4.16 06/04/2014   PSA 2.64 06/04/2014   INR 1.0 RATIO 04/07/2008   HGBA1C 5.8 10/26/2010     Assessment & Plan:

## 2014-10-20 NOTE — Assessment & Plan Note (Signed)
4/16 R/o [facility] ref

## 2014-10-20 NOTE — Progress Notes (Signed)
Pre visit review using our clinic review tool, if applicable. No additional management support is needed unless otherwise documented below in the visit note. 

## 2014-10-26 DIAGNOSIS — H9313 Tinnitus, bilateral: Secondary | ICD-10-CM | POA: Diagnosis not present

## 2014-10-26 DIAGNOSIS — I10 Essential (primary) hypertension: Secondary | ICD-10-CM | POA: Diagnosis not present

## 2014-10-26 DIAGNOSIS — R42 Dizziness and giddiness: Secondary | ICD-10-CM | POA: Diagnosis not present

## 2014-10-26 DIAGNOSIS — Z720 Tobacco use: Secondary | ICD-10-CM | POA: Diagnosis not present

## 2014-10-29 ENCOUNTER — Ambulatory Visit (INDEPENDENT_AMBULATORY_CARE_PROVIDER_SITE_OTHER): Payer: Medicare Other | Admitting: Sports Medicine

## 2014-10-29 ENCOUNTER — Encounter: Payer: Self-pay | Admitting: Sports Medicine

## 2014-10-29 VITALS — BP 118/79 | HR 74 | Ht 75.0 in | Wt 175.0 lb

## 2014-10-29 DIAGNOSIS — M1711 Unilateral primary osteoarthritis, right knee: Secondary | ICD-10-CM | POA: Diagnosis present

## 2014-10-29 MED ORDER — METHYLPREDNISOLONE ACETATE 40 MG/ML IJ SUSP
40.0000 mg | Freq: Once | INTRAMUSCULAR | Status: AC
  Administered 2014-10-29: 40 mg via INTRA_ARTICULAR

## 2014-10-29 NOTE — Progress Notes (Signed)
   Subjective:    Patient ID: David Buck, male    DOB: [birth year], 74 y.o.   MRN: [id number]  HPI chief complaint: Right knee pain  Patient comes in today with returning right knee pain. He has a well-documented history of right knee DJD. He has had cortisone injections as well as Supartz injections in the past. He has done well with all of these. Pain began to return about 10 days ago. He describes pain and stiffness in the knee especially with prolonged sitting. He has not noticed any swelling. Pain will occasionally radiate down into his lower leg but he denies any numbness or tingling. Also getting a little bit of posterior lateral hip pain. No groin pain. He takes naproxen on an intermittent basis and does note that it helps. No mechanical symptoms. No recent trauma.  Interim medical history reviewed Medications reviewed Allergies reviewed   Review of Systems     Objective:   Physical Exam Well-developed, well-nourished. No acute distress.  Right knee: Patient lacks about 2-3 of extension. Full flexion. Trace patellofemoral crepitus. No effusion. Slight tenderness to palpation along the medial joint line but a negative Murray's. No tenderness along the lateral joint line. Good joint stability. Neurovascular intact distally. Walking with only a slight limp.       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 An anterior medial approach was utilized after risks and benefits were explained. He tolerated this without difficulty. He may want to pursue another round of Visco supplementation in the near future. He will continue with his naproxen as needed. Follow-up with me when necessary.  Consent obtained and verified. Time-out conducted. Noted no overlying erythema, induration, or other signs of local infection. Skin prepped in a sterile fashion. Topical analgesic spray: Ethyl  chloride. Joint: right knee Needle: 22g 1.5 inch Completed without difficulty. Meds: 3cc 1% xylocaine, 1cc ('40mg'$ )depomedrol  Advised to call if fevers/chills, erythema, induration, drainage, or persistent bleeding.

## 2014-11-30 ENCOUNTER — Ambulatory Visit: Payer: Medicare Other | Admitting: Internal Medicine

## 2014-12-03 ENCOUNTER — Ambulatory Visit (INDEPENDENT_AMBULATORY_CARE_PROVIDER_SITE_OTHER): Payer: Medicare Other | Admitting: Internal Medicine

## 2014-12-03 ENCOUNTER — Encounter: Payer: Self-pay | Admitting: Internal Medicine

## 2014-12-03 VITALS — BP 138/80 | HR 71 | Wt 171.0 lb

## 2014-12-03 DIAGNOSIS — I10 Essential (primary) hypertension: Secondary | ICD-10-CM

## 2014-12-03 DIAGNOSIS — G459 Transient cerebral ischemic attack, unspecified: Secondary | ICD-10-CM | POA: Diagnosis not present

## 2014-12-03 MED ORDER — ASPIRIN 325 MG PO TABS: 325.0000 mg | ORAL_TABLET | Freq: Every day | ORAL | Status: DC

## 2014-12-03 NOTE — Progress Notes (Signed)
   Subjective:    HPI  F/u a dizzy spell that lasted 30 min - 3 mo ago; spinning, lightheaded - no relapse. Yesterday L arm felt weak and shaky - he could not put a cigarette in his mouth due to shaking x 1 hr (1 ppd).   The patient presents for a follow-up of  chronic hypothyroidism controlled with medicines  Wt Readings from Last 3 Encounters:  12/03/14 171 lb (77.565 kg)  10/29/14 175 lb (79.379 kg)  10/20/14 174 lb (78.926 kg)   BP Readings from Last 3 Encounters:  12/03/14 138/80  10/29/14 118/79  10/20/14 130/84      Review of Systems  Constitutional: Negative for fever, activity change, appetite change, fatigue and unexpected weight change.  HENT: Negative for congestion, nosebleeds, sneezing, sore throat and trouble swallowing.   Eyes: Negative for redness, itching and visual disturbance.  Respiratory: Negative for apnea, cough, choking and shortness of breath.   Cardiovascular: Negative for chest pain, palpitations and leg swelling.  Gastrointestinal: Negative for nausea, diarrhea, blood in stool and abdominal distention.  Genitourinary: Negative for frequency and hematuria.  Musculoskeletal: Negative for back pain, joint swelling, gait problem and neck pain.  Skin: Negative for rash.  Neurological: Negative for dizziness, tremors, speech difficulty, weakness and light-headedness.  Hematological: Negative for adenopathy.  Psychiatric/Behavioral: Negative for sleep disturbance, dysphoric mood and agitation. The patient is not nervous/anxious.        Objective:   Physical Exam  Constitutional: He is oriented to person, place, and time. He appears well-developed. No distress.  NAD  HENT:  Mouth/Throat: Oropharynx is clear and moist.  Eyes: Conjunctivae are normal. Pupils are equal, round, and reactive to light.  Neck: Normal range of motion. No JVD present. No thyromegaly present.  Cardiovascular: Normal rate, regular rhythm, normal heart sounds and intact  distal pulses.  Exam reveals no gallop and no friction rub.   No murmur heard. Pulmonary/Chest: Effort normal and breath sounds normal. No respiratory distress. He has no wheezes. He has no rales. He exhibits no tenderness.  Abdominal: Soft. Bowel sounds are normal. He exhibits no distension and no mass. There is no tenderness. There is no rebound and no guarding.  Musculoskeletal: Normal range of motion. He exhibits no edema or tenderness.  Lymphadenopathy:    He has no cervical adenopathy.  Neurological: He is alert and oriented to person, place, and time. He has normal reflexes. No cranial nerve deficit. He exhibits normal muscle tone. He displays a negative Romberg sign. Coordination and gait normal.  No meningeal signs  Skin: Skin is warm and dry. No rash noted.  Psychiatric: He has a normal mood and affect. His behavior is normal. Judgment and thought content normal.  B knees w/o effusion Slightly ataxic (chronic) EKG was compared to previous      Lab Results  Component Value Date   WBC 6.5 06/04/2014   HGB 13.7 06/04/2014   HCT 40.8 06/04/2014   PLT 250.0 06/04/2014   CHOL 119 06/04/2014   TRIG 41.0 06/04/2014   HDL 48.10 06/04/2014   ALT 17 06/04/2014   AST 20 06/04/2014   NA 136 06/04/2014   K 3.9 06/04/2014   CL 105 06/04/2014   CREATININE 0.9 06/04/2014   BUN 25* 06/04/2014   CO2 26 06/04/2014   TSH 4.16 06/04/2014   PSA 2.64 06/04/2014   INR 1.0 RATIO 04/07/2008   HGBA1C 5.8 10/26/2010     Assessment & Plan:

## 2014-12-03 NOTE — Assessment & Plan Note (Signed)
MRI/MRA w/carotids ASA 325 qd Stop smoking

## 2014-12-03 NOTE — Progress Notes (Signed)
Pre visit review using our clinic review tool, if applicable. No additional management support is needed unless otherwise documented below in the visit note. 

## 2014-12-05 NOTE — Assessment & Plan Note (Signed)
Controlled  On HCTZ

## 2014-12-06 ENCOUNTER — Other Ambulatory Visit: Payer: Self-pay | Admitting: *Deleted

## 2014-12-06 ENCOUNTER — Other Ambulatory Visit (INDEPENDENT_AMBULATORY_CARE_PROVIDER_SITE_OTHER): Payer: Medicare Other

## 2014-12-06 DIAGNOSIS — E038 Other specified hypothyroidism: Secondary | ICD-10-CM

## 2014-12-06 DIAGNOSIS — G458 Other transient cerebral ischemic attacks and related syndromes: Secondary | ICD-10-CM

## 2014-12-06 LAB — CBC WITH DIFFERENTIAL/PLATELET
BASOS PCT: 0.4 % (ref 0.0–3.0)
Basophils Absolute: 0 10*3/uL (ref 0.0–0.1)
EOS PCT: 7.8 % — AB (ref 0.0–5.0)
Eosinophils Absolute: 0.5 10*3/uL (ref 0.0–0.7)
HCT: 41.1 % (ref 39.0–52.0)
Hemoglobin: 13.5 g/dL (ref 13.0–17.0)
Lymphocytes Relative: 21 % (ref 12.0–46.0)
Lymphs Abs: 1.3 10*3/uL (ref 0.7–4.0)
MCHC: 32.9 g/dL (ref 30.0–36.0)
MCV: 94.6 fl (ref 78.0–100.0)
MONOS PCT: 7.8 % (ref 3.0–12.0)
Monocytes Absolute: 0.5 10*3/uL (ref 0.1–1.0)
NEUTROS PCT: 63 % (ref 43.0–77.0)
Neutro Abs: 4 10*3/uL (ref 1.4–7.7)
PLATELETS: 238 10*3/uL (ref 150.0–400.0)
RBC: 4.34 Mil/uL (ref 4.22–5.81)
RDW: 12.8 % (ref 11.5–15.5)
WBC: 6.3 10*3/uL (ref 4.0–10.5)

## 2014-12-06 LAB — BASIC METABOLIC PANEL
BUN: 19 mg/dL (ref 6–23)
CO2: 30 mEq/L (ref 19–32)
CREATININE: 0.9 mg/dL (ref 0.40–1.50)
Calcium: 9.6 mg/dL (ref 8.4–10.5)
Chloride: 103 mEq/L (ref 96–112)
GFR: 87.95 mL/min (ref >60.00)
Glucose, Bld: 99 mg/dL (ref 70–99)
POTASSIUM: 3.7 meq/L (ref 3.5–5.1)
SODIUM: 136 meq/L (ref 135–145)

## 2014-12-06 LAB — LIPID PANEL
CHOL/HDL RATIO: 2
Cholesterol: 111 mg/dL (ref 0–200)
HDL: 48 mg/dL (ref >39.00)
LDL Cholesterol: 54 mg/dL (ref 0–99)
NONHDL: 63
Triglycerides: 46 mg/dL (ref 0.0–149.0)
VLDL: 9.2 mg/dL (ref 0.0–40.0)

## 2014-12-06 LAB — TSH: TSH: 4.53 u[IU]/mL — ABNORMAL HIGH (ref 0.35–4.50)

## 2014-12-14 ENCOUNTER — Telehealth: Payer: Self-pay | Admitting: Internal Medicine

## 2014-12-14 DIAGNOSIS — G459 Transient cerebral ischemic attack, unspecified: Secondary | ICD-10-CM

## 2014-12-14 NOTE — Telephone Encounter (Signed)
Spoke with [facility] and advised (per [name]) that he wanted the carotids with contrast as well as without. Head MRI without contrast only.

## 2014-12-14 NOTE — Telephone Encounter (Signed)
Colletta Maryland from [facility] would like to know the specifics on what for pat concerning her MRI. Please advise. Phone # [phone]

## 2014-12-15 ENCOUNTER — Ambulatory Visit
Admission: RE | Admit: 2014-12-15 | Discharge: 2014-12-15 | Disposition: A | Discharge status: Home / Self Care | Payer: Medicare Other | Source: Ambulatory Visit | Attending: Internal Medicine | Admitting: Internal Medicine

## 2014-12-15 DIAGNOSIS — G459 Transient cerebral ischemic attack, unspecified: Secondary | ICD-10-CM

## 2014-12-15 MED ORDER — GADOBENATE DIMEGLUMINE 529 MG/ML IV SOLN
16.0000 mL | Freq: Once | INTRAVENOUS | Status: AC | PRN
  Administered 2014-12-15: 16 mL via INTRAVENOUS

## 2014-12-17 ENCOUNTER — Encounter: Payer: Self-pay | Admitting: Internal Medicine

## 2014-12-17 ENCOUNTER — Ambulatory Visit (INDEPENDENT_AMBULATORY_CARE_PROVIDER_SITE_OTHER): Payer: Medicare Other | Admitting: Internal Medicine

## 2014-12-17 VITALS — BP 110/78 | HR 77 | Wt 171.0 lb

## 2014-12-17 DIAGNOSIS — G459 Transient cerebral ischemic attack, unspecified: Secondary | ICD-10-CM

## 2014-12-17 MED ORDER — ASPIRIN 325 MG PO TABS: 162.0000 mg | ORAL_TABLET | Freq: Every day | ORAL | Status: DC

## 2014-12-17 NOTE — Progress Notes (Signed)
Pre visit review using our clinic review tool, if applicable. No additional management support is needed unless otherwise documented below in the visit note. 

## 2014-12-17 NOTE — Assessment & Plan Note (Signed)
08/2014 x1  IMPRESSION: 1. Normal MRA circle of Willis without evidence for significant proximal stenosis, aneurysm, or branch vessel occlusion. 2. Negative MRA of the neck without and with contrast. No focal stenosis or vascular injury is evident. The internal carotid arteries are normal bilaterally.   Electronically Signed  By: San Morelle M.D.  On: 12/15/2014 21:51  ASA 162 mg/d

## 2014-12-17 NOTE — Progress Notes (Signed)
Subjective:    HPI  F/u a dizzy spell that lasted 30 min - 3 mo ago; spinning, lightheaded - no relapse. Yesterday L arm felt weak and shaky - he could not put a cigarette in his mouth due to shaking x 1 hr (1 ppd).   The patient presents for a follow-up of  chronic hypothyroidism controlled with medicines  Wt Readings from Last 3 Encounters:  12/17/14 171 lb (77.565 kg)  12/03/14 171 lb (77.565 kg)  10/29/14 175 lb (79.379 kg)   BP Readings from Last 3 Encounters:  12/17/14 110/78  12/03/14 138/80  10/29/14 118/79      Review of Systems  Constitutional: Negative for fever, activity change, appetite change, fatigue and unexpected weight change.  HENT: Negative for congestion, nosebleeds, sneezing, sore throat and trouble swallowing.   Eyes: Negative for redness, itching and visual disturbance.  Respiratory: Negative for apnea, cough, choking and shortness of breath.   Cardiovascular: Negative for chest pain, palpitations and leg swelling.  Gastrointestinal: Negative for nausea, diarrhea, blood in stool and abdominal distention.  Genitourinary: Negative for frequency and hematuria.  Musculoskeletal: Negative for back pain, joint swelling, gait problem and neck pain.  Skin: Negative for rash.  Neurological: Negative for dizziness, tremors, speech difficulty, weakness and light-headedness.  Hematological: Negative for adenopathy.  Psychiatric/Behavioral: Negative for sleep disturbance, dysphoric mood and agitation. The patient is not nervous/anxious.        Objective:   Physical Exam  Constitutional: He is oriented to person, place, and time. He appears well-developed. No distress.  NAD  HENT:  Mouth/Throat: Oropharynx is clear and moist.  Eyes: Conjunctivae are normal. Pupils are equal, round, and reactive to light.  Neck: Normal range of motion. No JVD present. No thyromegaly present.  Cardiovascular: Normal rate, regular rhythm, normal heart sounds and intact  distal pulses.  Exam reveals no gallop and no friction rub.   No murmur heard. Pulmonary/Chest: Effort normal and breath sounds normal. No respiratory distress. He has no wheezes. He has no rales. He exhibits no tenderness.  Abdominal: Soft. Bowel sounds are normal. He exhibits no distension and no mass. There is no tenderness. There is no rebound and no guarding.  Musculoskeletal: Normal range of motion. He exhibits no edema or tenderness.  Lymphadenopathy:    He has no cervical adenopathy.  Neurological: He is alert and oriented to person, place, and time. He has normal reflexes. No cranial nerve deficit. He exhibits normal muscle tone. He displays a negative Romberg sign. Coordination and gait normal.  No meningeal signs  Skin: Skin is warm and dry. No rash noted.  Psychiatric: He has a normal mood and affect. His behavior is normal. Judgment and thought content normal.  B knees w/o effusion Slightly ataxic (chronic) EKG was compared to previous    IMPRESSION: 1. Normal MRA circle of Willis without evidence for significant proximal stenosis, aneurysm, or branch vessel occlusion. 2. Negative MRA of the neck without and with contrast. No focal stenosis or vascular injury is evident. The internal carotid arteries are normal bilaterally.   Electronically Signed  By: San Morelle M.D.  On: 12/15/2014 21:51     Lab Results  Component Value Date   WBC 6.3 12/06/2014   HGB 13.5 12/06/2014   HCT 41.1 12/06/2014   PLT 238.0 12/06/2014   CHOL 111 12/06/2014   TRIG 46.0 12/06/2014   HDL 48.00 12/06/2014   ALT 17 06/04/2014   AST 20 06/04/2014   NA 136 12/06/2014  K 3.7 12/06/2014   CL 103 12/06/2014   CREATININE 0.90 12/06/2014   BUN 19 12/06/2014   CO2 30 12/06/2014   TSH 4.53* 12/06/2014   PSA 2.64 06/04/2014   INR 1.0 RATIO 04/07/2008   HGBA1C 5.8 10/26/2010     Assessment & Plan:

## 2014-12-20 ENCOUNTER — Encounter: Payer: Self-pay | Admitting: Internal Medicine

## 2015-02-03 ENCOUNTER — Ambulatory Visit (INDEPENDENT_AMBULATORY_CARE_PROVIDER_SITE_OTHER): Payer: Medicare Other | Admitting: Internal Medicine

## 2015-02-03 ENCOUNTER — Encounter: Payer: Self-pay | Admitting: Internal Medicine

## 2015-02-03 VITALS — BP 100/68 | HR 80 | Ht 74.0 in | Wt 173.0 lb

## 2015-02-03 DIAGNOSIS — Z720 Tobacco use: Secondary | ICD-10-CM

## 2015-02-03 DIAGNOSIS — J432 Centrilobular emphysema: Secondary | ICD-10-CM | POA: Diagnosis not present

## 2015-02-03 DIAGNOSIS — F17200 Nicotine dependence, unspecified, uncomplicated: Secondary | ICD-10-CM

## 2015-02-03 DIAGNOSIS — R911 Solitary pulmonary nodule: Secondary | ICD-10-CM | POA: Diagnosis not present

## 2015-02-03 NOTE — Patient Instructions (Signed)
I will continue to encourage you to stop smoking   Ok to experiment with whether you need an antihistamine or not  We will skip chest xray this year and anticipate getting one next year, unless needed sooner

## 2015-02-03 NOTE — Progress Notes (Signed)
Subjective:    Patient ID: David Buck, male    DOB: 08-15-1940, 74 y.o.   MRN: [id number]  HPI 01/29/11- 38 yoM 1 PPD smoker followed for small lung nodules/ RUL nodular scarring/ benign bronchoscopy 04/14/08- neg AFB.   Wife here        Last here 01/30/2010 CXR- 01/30/10- COPD with stable scarring, NAD.  Aware of some pn drip- could take antihistamine, but doesn't bother him enough to need to. Denies productive cough, night sweats, weight loss, fever, nodes..  Some cough, but no real change in pattern  01/29/12- 34 yoM 1 PPD smoker followed for small lung nodules/ RUL nodular scarring/ benign bronchoscopy 04/14/08- neg AFB. Doing well overall unless more active (gets carried away with it).  Wife here He is no more inclined than before, to stop smoking. His wife's feelings about the issue are clear. He denies any change in his breathing. We had a sustained conversation about atypical AFB. He describes some bland cough and blames allergy for postnasal drip. These Dr. Wilburn Cornelia had offered to repair septal deviation but patient refused at this time. He is using one half tab Claritin. Denies headache or discolored mucus and has no history of glaucoma. COPD assessment test (CAT) score 6/40. CXR 01/29/11- discussed with them. 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  Original Report Authenticated By: Dereck Ligas, M.D.   01/29/13- 70 yoM 1 PPD smoker followed for small lung nodules/ RUL nodular scarring/ benign bronchoscopy 04/14/08- neg AFB. Wife here Yearly follow up.  Breathing doing well overall.  No SOB, wheezing, chest tightness, chest pain, or cough at this time. Still smoking. He does ask this time about using E.-cigarettes. We discussed smoking cessation  01/24/14- 73 yoM 1 PPD smoker followed for small lung nodules/ RUL nodular scarring/ benign bronchoscopy 04/14/08- neg AFB. Daughter Administrator, Civil Service) here FOLLOWS FOR: Pt has no breathing problems to report at this  time.  Continues smoking 1 pack per day. We discussed smoking cessation and electronic cigarettes again. Uses one loratadine daily-wears off after about 20 hours. Some postnasal drip. We reviewed images of his last chest x-ray CXR 01/30/13 IMPRESSION:  Tiny nodularity versus vessel and in the right upper lobe stable  compared to prior exam of January 29, 2011. 2-year follow-up has  been established.  No acute cardiopulmonary disease identified. COPD.  Original Report Authenticated By: Abelardo Diesel, M.D. 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  Emphysema pattern developing in flow volume loop.  02/03/15-  65 yoM 1 PPD smoker followed for small lung nodules/ RUL nodular scarring/ benign bronchoscopy 04/14/08- neg AFB.  FOLLOWS FOR: Pt states his breathing is fine; denies any SOB, wheezing, cough, or congestion.  He feels well. Still smoking. We reviewed chest x-ray and office spirometry from last visit CXR 02/01/14-  IMPRESSION: Stable 3 mm nodular opacity right upper stable from 01/29/2013 and in turn stable from 01/29/2011. This would be considered benign given long-term stability. Electronically Signed  By: Skipper Cliche M.D.  On: 02/01/2014 15:35  ROS-see HPI Constitutional:   No-   weight loss, night sweats, fevers, chills, fatigue, lassitude. HEENT:   No-  headaches, difficulty swallowing, tooth/dental problems, sore throat,       No-  sneezing, itching, ear ache, nasal congestion, +post nasal drip,  CV:  No-   chest pain, orthopnea, PND, swelling in lower extremities, anasarca, dizziness, palpitations Resp: No-   shortness of breath with exertion or at rest.  No-   productive cough,  No- non-productive cough,  No- coughing up of blood.              No-   change in color of mucus.  No- wheezing.   Skin: No-   rash or lesions. GI:  No-   heartburn, indigestion, abdominal pain, nausea, vomiting,  GU:  MS:  No-   joint  pain or swelling.  Neuro-     nothing unusual Psych:  No- change in mood or affect. No depression or anxiety.  No memory loss.  Objective:   Physical Exam General- Alert, Oriented, Affect-appropriate, Distress- none acute. tall slender man Skin- rash-none, lesions- none, excoriation- none Lymphadenopathy- none Head- atraumatic            Eyes- Gross vision intact, PERRLA, conjunctivae clear secretions            Ears- Hearing, canals normal            Nose- Clear, No- Septal dev, +mucus postnasal drip, polyps, erosion, perforation             Throat- Mallampati II , mucosa clear , drainage- none, tonsils- atrophic  dentures Neck- flexible , trachea midline, no stridor , thyroid nl, carotid no bruit Chest - symmetrical excursion , unlabored           Heart/CV- RRR , no murmur , no gallop  , no rub, nl s1 s2                           - JVD- none , edema- none, stasis changes- none, varices- none           Lung- +coarse breath sounds, unlabored, wheeze- none, cough- none , dullness-none, rub- none           Chest wall-  Abd-  Br/ Gen/ Rectal- Not done, not indicated Extrem- cyanosis- none, clubbing, none, atrophy- none, strength- nl Neuro- grossly intact to observation    Assessment & Plan:

## 2015-02-04 NOTE — Assessment & Plan Note (Signed)
Although he continues to smoke against advice, I think we can watch chest x-ray every other year or so now. He is comfortable with this.

## 2015-02-04 NOTE — Assessment & Plan Note (Signed)
We bring up smoking cessation at each visit, offering support and encouragement.

## 2015-02-04 NOTE — Assessment & Plan Note (Signed)
No acute bronchitis. 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 Plan-discussed management.

## 2015-02-17 DIAGNOSIS — L814 Other melanin hyperpigmentation: Secondary | ICD-10-CM | POA: Diagnosis not present

## 2015-02-17 DIAGNOSIS — D2271 Melanocytic nevi of right lower limb, including hip: Secondary | ICD-10-CM | POA: Diagnosis not present

## 2015-02-17 DIAGNOSIS — D2272 Melanocytic nevi of left lower limb, including hip: Secondary | ICD-10-CM | POA: Diagnosis not present

## 2015-02-17 DIAGNOSIS — L821 Other seborrheic keratosis: Secondary | ICD-10-CM | POA: Diagnosis not present

## 2015-02-17 DIAGNOSIS — D1801 Hemangioma of skin and subcutaneous tissue: Secondary | ICD-10-CM | POA: Diagnosis not present

## 2015-02-17 DIAGNOSIS — D225 Melanocytic nevi of trunk: Secondary | ICD-10-CM | POA: Diagnosis not present

## 2015-03-16 ENCOUNTER — Ambulatory Visit: Payer: Medicare Other | Admitting: Sports Medicine

## 2015-03-28 ENCOUNTER — Encounter: Payer: Self-pay | Admitting: Sports Medicine

## 2015-03-28 ENCOUNTER — Ambulatory Visit (INDEPENDENT_AMBULATORY_CARE_PROVIDER_SITE_OTHER): Payer: Medicare Other | Admitting: Sports Medicine

## 2015-03-28 VITALS — BP 122/79 | Ht 75.0 in | Wt 170.0 lb

## 2015-03-28 DIAGNOSIS — M1711 Unilateral primary osteoarthritis, right knee: Secondary | ICD-10-CM

## 2015-03-28 MED ORDER — METHYLPREDNISOLONE ACETATE 40 MG/ML IJ SUSP
40.0000 mg | Freq: Once | INTRAMUSCULAR | Status: AC
  Administered 2015-03-28: 40 mg via INTRA_ARTICULAR

## 2015-03-28 NOTE — Progress Notes (Signed)
   Subjective:    Patient ID: David Buck, male    DOB: 1941/01/16, 74 y.o.   MRN: [id number]  HPI chief complaint: Right knee pain  Patient comes in today complaining of returning right knee pain. He has a well-documented history of knee DJD. Status post meniscectomy in the early 2000s. He has undergone several cortisone injections in the past as well as several rounds of Visco supplementation. He was doing well until about 4 weeks ago when his pain began to return. Today it is not as bad. Pain is diffuse throughout the knee. He gets some mild associated swelling. Some mild stiffness as well. No mechanical symptoms. He takes over-the-counter Aleve as needed which does seem to help. He denies any recent trauma. No radiating pain into his lower leg or foot.   Interim medical history reviewed Medications reviewed Allergies reviewed    Review of Systems     as above Objective:   Physical Exam  Well-developed, well-nourished. No acute distress. Awake alert and oriented 3. Vital signs reviewed.  Right knee: Full range of motion. No effusion. There is bony hypertrophy along the medial knee consistent with DJD. Tenderness to palpation along the medial joint line with pain but no popping with McMurray's maneuver. No real tenderness to palpation laterally. Good ligamentous stability. Neurovascular intact distally.      Assessment & Plan:   Returning right knee pain secondary to DJD  Patient's right knee is injected today with cortisone. An anterior medial approach is utilized. Patient tolerated this without difficulty. Continue with PRN Aleve. He has an understanding of his home exercise program and I've encouraged him to be more compliant with his exercises. Follow-up with me as needed.  Consent obtained and verified. Time-out conducted. Noted no overlying erythema, induration, or other signs of local infection. Skin prepped in a sterile fashion. Topical analgesic spray: Ethyl  chloride. Joint: right knee Needle: 18g 1.5 inch Completed without difficulty. Meds: 3cc 1% xylocaine, 1cc ('40mg'$ ) depomedrol  Advised to call if fevers/chills, erythema, induration, drainage, or persistent bleeding.

## 2015-04-11 ENCOUNTER — Ambulatory Visit: Payer: Medicare Other | Admitting: Sports Medicine

## 2015-04-18 ENCOUNTER — Ambulatory Visit (INDEPENDENT_AMBULATORY_CARE_PROVIDER_SITE_OTHER): Payer: Medicare Other | Admitting: Internal Medicine

## 2015-04-18 ENCOUNTER — Other Ambulatory Visit (INDEPENDENT_AMBULATORY_CARE_PROVIDER_SITE_OTHER): Payer: Medicare Other

## 2015-04-18 ENCOUNTER — Encounter: Payer: Self-pay | Admitting: Internal Medicine

## 2015-04-18 VITALS — BP 138/74 | HR 73 | Wt 171.0 lb

## 2015-04-18 DIAGNOSIS — I10 Essential (primary) hypertension: Secondary | ICD-10-CM | POA: Diagnosis not present

## 2015-04-18 DIAGNOSIS — R972 Elevated prostate specific antigen [PSA]: Secondary | ICD-10-CM

## 2015-04-18 DIAGNOSIS — E038 Other specified hypothyroidism: Secondary | ICD-10-CM

## 2015-04-18 DIAGNOSIS — G459 Transient cerebral ischemic attack, unspecified: Secondary | ICD-10-CM | POA: Diagnosis not present

## 2015-04-18 DIAGNOSIS — J439 Emphysema, unspecified: Secondary | ICD-10-CM | POA: Diagnosis not present

## 2015-04-18 DIAGNOSIS — E034 Atrophy of thyroid (acquired): Secondary | ICD-10-CM

## 2015-04-18 DIAGNOSIS — Z23 Encounter for immunization: Secondary | ICD-10-CM

## 2015-04-18 LAB — T4, FREE: FREE T4: 1.08 ng/dL (ref 0.60–1.60)

## 2015-04-18 LAB — BASIC METABOLIC PANEL
BUN: 22 mg/dL (ref 6–23)
CHLORIDE: 103 meq/L (ref 96–112)
CO2: 29 meq/L (ref 19–32)
CREATININE: 0.93 mg/dL (ref 0.40–1.50)
Calcium: 9.8 mg/dL (ref 8.4–10.5)
GFR: 84.6 mL/min (ref >60.00)
GLUCOSE: 76 mg/dL (ref 70–99)
Potassium: 3.5 mEq/L (ref 3.5–5.1)
Sodium: 139 mEq/L (ref 135–145)

## 2015-04-18 LAB — HEPATIC FUNCTION PANEL
ALBUMIN: 4.2 g/dL (ref 3.5–5.2)
ALK PHOS: 70 U/L (ref 39–117)
ALT: 17 U/L (ref 0–53)
AST: 17 U/L (ref 0–37)
BILIRUBIN DIRECT: 0.1 mg/dL (ref 0.0–0.3)
TOTAL PROTEIN: 7.1 g/dL (ref 6.0–8.3)
Total Bilirubin: 0.4 mg/dL (ref 0.2–1.2)

## 2015-04-18 LAB — TSH: TSH: 4.46 u[IU]/mL (ref 0.35–4.50)

## 2015-04-18 NOTE — Assessment & Plan Note (Signed)
Check TSH 

## 2015-04-18 NOTE — Progress Notes (Signed)
Pre visit review using our clinic review tool, if applicable. No additional management support is needed unless otherwise documented below in the visit note. 

## 2015-04-18 NOTE — Progress Notes (Signed)
Subjective:  Patient ID: David Buck, male    DOB: 07-Dec-1940  Age: 74 y.o. MRN: [id number]  CC: No chief complaint on file.   HPI ISAM UNREIN presents for allergies, HTN, BPH/elev PSA f/u. No TIA sx's  Outpatient Prescriptions Prior to Visit  Medication Sig Dispense Refill  . aspirin (BAYER ASPIRIN) 325 MG tablet Take 0.5 tablets (162 mg total) by mouth daily. 100 tablet 3  . Cholecalciferol (EQL VITAMIN D3) 1000 UNITS tablet Take 2,000 Units by mouth daily.     . fluticasone (FLONASE) 50 MCG/ACT nasal spray Place 2 sprays into the nose daily. As needed    . Glucosamine HCl 1000 MG TABS Take 1 tablet by mouth daily.      . hydrochlorothiazide (MICROZIDE) 12.5 MG capsule TAKE 1 CAPSULE (12.5 MG TOTAL) BY MOUTH DAILY. 60 capsule 5  . levothyroxine (SYNTHROID, LEVOTHROID) 75 MCG tablet TAKE ONE TABLET BY MOUTH DAILY 60 tablet 5  . loratadine (CLARITIN) 10 MG tablet Take 10 mg by mouth daily.     . multivitamin (THERAGRAN) per tablet Take 1 tablet by mouth daily.      . saw palmetto 160 MG capsule Take 160 mg by mouth daily.      . vitamin C (ASCORBIC ACID) 500 MG tablet Take 500 mg by mouth daily.       No facility-administered medications prior to visit.    ROS Review of Systems  Constitutional: Negative for appetite change, fatigue and unexpected weight change.  HENT: Negative for congestion, nosebleeds, sneezing, sore throat and trouble swallowing.   Eyes: Negative for itching and visual disturbance.  Respiratory: Negative for cough.   Cardiovascular: Negative for chest pain, palpitations and leg swelling.  Gastrointestinal: Negative for nausea, diarrhea, blood in stool and abdominal distention.  Genitourinary: Negative for frequency and hematuria.  Musculoskeletal: Negative for back pain, joint swelling, gait problem and neck pain.  Skin: Negative for rash.  Neurological: Negative for dizziness, tremors, speech difficulty and weakness.  Psychiatric/Behavioral:  Negative for suicidal ideas, sleep disturbance, dysphoric mood and agitation. The patient is not nervous/anxious.     Objective:  BP 138/74 mmHg  Pulse 73  Wt 171 lb (77.565 kg)  SpO2 96%  BP Readings from Last 3 Encounters:  04/18/15 138/74  03/28/15 122/79  02/03/15 100/68    Wt Readings from Last 3 Encounters:  04/18/15 171 lb (77.565 kg)  03/28/15 170 lb (77.111 kg)  02/03/15 173 lb (78.472 kg)    Physical Exam  Constitutional: He is oriented to person, place, and time. He appears well-developed. No distress.  NAD  HENT:  Mouth/Throat: Oropharynx is clear and moist.  Eyes: Conjunctivae are normal. Pupils are equal, round, and reactive to light.  Neck: Normal range of motion. No JVD present. No thyromegaly present.  Cardiovascular: Normal rate, regular rhythm, normal heart sounds and intact distal pulses.  Exam reveals no gallop and no friction rub.   No murmur heard. Pulmonary/Chest: Effort normal and breath sounds normal. No respiratory distress. He has no wheezes. He has no rales. He exhibits no tenderness.  Abdominal: Soft. Bowel sounds are normal. He exhibits no distension and no mass. There is no tenderness. There is no rebound and no guarding.  Musculoskeletal: Normal range of motion. He exhibits no edema or tenderness.  Lymphadenopathy:    He has no cervical adenopathy.  Neurological: He is alert and oriented to person, place, and time. He has normal reflexes. No cranial nerve deficit. He exhibits normal muscle  tone. He displays a negative Romberg sign. Coordination and gait normal.  Skin: Skin is warm and dry. No rash noted.  Psychiatric: He has a normal mood and affect. His behavior is normal. Judgment and thought content normal.    Lab Results  Component Value Date   WBC 6.3 12/06/2014   HGB 13.5 12/06/2014   HCT 41.1 12/06/2014   PLT 238.0 12/06/2014   GLUCOSE 76 04/18/2015   CHOL 111 12/06/2014   TRIG 46.0 12/06/2014   HDL 48.00 12/06/2014   LDLCALC  54 12/06/2014   ALT 17 04/18/2015   AST 17 04/18/2015   NA 139 04/18/2015   K 3.5 04/18/2015   CL 103 04/18/2015   CREATININE 0.93 04/18/2015   BUN 22 04/18/2015   CO2 29 04/18/2015   TSH 4.46 04/18/2015   PSA 2.64 06/04/2014   INR 1.0 RATIO 04/07/2008   HGBA1C 5.8 10/26/2010    Mr Jodene Nam Head Wo Contrast  12/15/2014  CLINICAL DATA:  Transient cerebral ischemia. Intermittent pain at the left scratch the intermittent left-sided pain at the base of the skull. Episode with loss of muscular control of the left hand. Symptoms over the last 4-5 weeks. EXAM: MRA NECK WITHOUT AND WITH CONTRAST MRA HEAD WITHOUT CONTRAST TECHNIQUE: Multiplanar and multiecho pulse sequences of the neck were obtained without and with intravenous contrast. Angiographic images of the neck were obtained using MRA technique without and with intravenous contast.; Angiographic images of the Circle of Willis were obtained using MRA technique without intravenous contrast. CONTRAST:  45m MULTIHANCE GADOBENATE DIMEGLUMINE 529 MG/ML IV SOLN COMPARISON:  None. FINDINGS: MRA NECK FINDINGS The time-of-flight images demonstrate no significant flow disturbance at either carotid bifurcation. Flow is antegrade in the vertebral arteries. A 3 vessel arch configuration is present. There is no significant stenosis at the origin of the great vessels. The right common carotid artery is within normal limits. The bifurcation is unremarkable. Cervical right ICA is normal. The left common carotid artery is within normal limits. The bifurcation is unremarkable. The cervical left ICA is normal to skullbase. Both vertebral arteries originate from the subclavian arteries. Left vertebral artery is slightly dominant to the right. There are no focal stenosis. MRA HEAD FINDINGS The internal carotid arteries are within normal limits from the high cervical segments through the ICA termini. There is no focal injury or stenosis at the skullbase. The A1 and M1 segments  are normal. The anterior communicating artery is patent. The MCA bifurcations are intact. ACA MCA branch vessels are within normal limits. The left vertebral artery is the dominant vessel. The right PICA origin is visualized and normal. The left AICA is dominant. Both posterior cerebral arteries originate from the basilar tip. The PCA branch vessels are intact. IMPRESSION: 1. Normal MRA circle of Willis without evidence for significant proximal stenosis, aneurysm, or branch vessel occlusion. 2. Negative MRA of the neck without and with contrast. No focal stenosis or vascular injury is evident. The internal carotid arteries are normal bilaterally. Electronically Signed   By: CSan MorelleM.D.   On: 12/15/2014 21:51   Mr Angiogram Neck W Wo Contrast  12/15/2014  CLINICAL DATA:  Transient cerebral ischemia. Intermittent pain at the left scratch the intermittent left-sided pain at the base of the skull. Episode with loss of muscular control of the left hand. Symptoms over the last 4-5 weeks. EXAM: MRA NECK WITHOUT AND WITH CONTRAST MRA HEAD WITHOUT CONTRAST TECHNIQUE: Multiplanar and multiecho pulse sequences of the neck were obtained without and  with intravenous contrast. Angiographic images of the neck were obtained using MRA technique without and with intravenous contast.; Angiographic images of the Circle of Willis were obtained using MRA technique without intravenous contrast. CONTRAST:  98m MULTIHANCE GADOBENATE DIMEGLUMINE 529 MG/ML IV SOLN COMPARISON:  None. FINDINGS: MRA NECK FINDINGS The time-of-flight images demonstrate no significant flow disturbance at either carotid bifurcation. Flow is antegrade in the vertebral arteries. A 3 vessel arch configuration is present. There is no significant stenosis at the origin of the great vessels. The right common carotid artery is within normal limits. The bifurcation is unremarkable. Cervical right ICA is normal. The left common carotid artery is within  normal limits. The bifurcation is unremarkable. The cervical left ICA is normal to skullbase. Both vertebral arteries originate from the subclavian arteries. Left vertebral artery is slightly dominant to the right. There are no focal stenosis. MRA HEAD FINDINGS The internal carotid arteries are within normal limits from the high cervical segments through the ICA termini. There is no focal injury or stenosis at the skullbase. The A1 and M1 segments are normal. The anterior communicating artery is patent. The MCA bifurcations are intact. ACA MCA branch vessels are within normal limits. The left vertebral artery is the dominant vessel. The right PICA origin is visualized and normal. The left AICA is dominant. Both posterior cerebral arteries originate from the basilar tip. The PCA branch vessels are intact. IMPRESSION: 1. Normal MRA circle of Willis without evidence for significant proximal stenosis, aneurysm, or branch vessel occlusion. 2. Negative MRA of the neck without and with contrast. No focal stenosis or vascular injury is evident. The internal carotid arteries are normal bilaterally. Electronically Signed   By: CSan MorelleM.D.   On: 12/15/2014 21:51    Assessment & Plan:   Diagnoses and all orders for this visit:  Essential hypertension -     Basic metabolic panel; Future -     Hepatic function panel; Future  Transient cerebral ischemia, unspecified transient cerebral ischemia type -     Basic metabolic panel; Future -     Hepatic function panel; Future  Hypothyroidism due to acquired atrophy of thyroid -     TSH; Future -     T4, free; Future  Pulmonary emphysema, unspecified emphysema type (HCC)  Elevated PSA  Need for influenza vaccination -     Flu Vaccine QUAD 36+ mos IM  I am having Mr. Canaday maintain his fluticasone, Glucosamine HCl, loratadine, multivitamin, vitamin C, Cholecalciferol, saw palmetto, hydrochlorothiazide, levothyroxine, and aspirin.  No orders of  the defined types were placed in this encounter.     Follow-up: Return in about 6 months (around 10/17/2015) for Wellness Exam.  AWalker Kehr MD

## 2015-04-18 NOTE — Assessment & Plan Note (Signed)
On HCTZ 

## 2015-04-18 NOTE — Assessment & Plan Note (Signed)
Doing well 

## 2015-04-18 NOTE — Assessment & Plan Note (Signed)
Urol f/u in 2 mo

## 2015-06-01 ENCOUNTER — Ambulatory Visit (INDEPENDENT_AMBULATORY_CARE_PROVIDER_SITE_OTHER): Payer: Medicare Other | Admitting: Sports Medicine

## 2015-06-01 ENCOUNTER — Encounter: Payer: Self-pay | Admitting: Sports Medicine

## 2015-06-01 ENCOUNTER — Ambulatory Visit
Admission: RE | Admit: 2015-06-01 | Discharge: 2015-06-01 | Disposition: A | Discharge status: Home / Self Care | Payer: Medicare Other | Source: Ambulatory Visit | Attending: Sports Medicine | Admitting: Sports Medicine

## 2015-06-01 VITALS — BP 124/88 | Ht 75.0 in | Wt 173.0 lb

## 2015-06-01 DIAGNOSIS — M179 Osteoarthritis of knee, unspecified: Secondary | ICD-10-CM | POA: Diagnosis not present

## 2015-06-01 DIAGNOSIS — M1711 Unilateral primary osteoarthritis, right knee: Secondary | ICD-10-CM

## 2015-06-01 MED ORDER — METHYLPREDNISOLONE ACETATE 40 MG/ML IJ SUSP
40.0000 mg | Freq: Once | INTRAMUSCULAR | Status: AC
  Administered 2015-06-01: 40 mg via INTRA_ARTICULAR

## 2015-06-01 NOTE — Progress Notes (Signed)
   Subjective:    Patient ID: David Buck, male    DOB: [birth year], 74 y.o.   MRN: [id number]  HPI chief complaint: Right knee pain  Patient comes in today with returning right knee pain. He has a history of right knee DJD. His knee was last injected with cortisone back in October. This only provided him with about 4 months of symptom relief. He would like to go ahead and pursue another round of Visco supplementation. Last round was done in 2015 with good results. He denies any recent trauma. He has not noticed any swelling.    Review of Systems     Objective:   Physical Exam Well-developed, well-nourished. No acute distress. Awake alert and oriented 3.  Right knee: Full range of motion. No effusion. He is tender to palpation along the medial joint line but a negative McMurray's. No tenderness to palpation along the lateral joint line. Good ligamentous stability. Neurovascularly intact distally. Walking with a slight limp.       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 For short-term relief I've agreed to reinject his knee today with cortisone. He would like to repeat a round of Supartz so I will have him return to the office next month to start this. In the meantime I would like to get some updated x-rays of his right knee including a 30 flexion view. Patient will continue with his body helix compression sleeve with activity.  Consent obtained and verified. Time-out conducted. Noted no overlying erythema, induration, or other signs of local infection. Skin prepped in a sterile fashion. Topical analgesic spray: Ethyl chloride. Joint: right knee, anterior medial approach Needle: 22g 1.5 inch Completed without difficulty. Meds: 3cc 1% xylocaine, 1cc ('40mg'$ ) depomedrol  Advised to call if fevers/chills, erythema,  induration, drainage, or persistent bleeding.

## 2015-06-15 DIAGNOSIS — H52223 Regular astigmatism, bilateral: Secondary | ICD-10-CM | POA: Diagnosis not present

## 2015-06-15 DIAGNOSIS — H43393 Other vitreous opacities, bilateral: Secondary | ICD-10-CM | POA: Diagnosis not present

## 2015-06-15 DIAGNOSIS — H524 Presbyopia: Secondary | ICD-10-CM | POA: Diagnosis not present

## 2015-06-15 DIAGNOSIS — H5203 Hypermetropia, bilateral: Secondary | ICD-10-CM | POA: Diagnosis not present

## 2015-07-04 ENCOUNTER — Ambulatory Visit (INDEPENDENT_AMBULATORY_CARE_PROVIDER_SITE_OTHER): Payer: Medicare Other | Admitting: Sports Medicine

## 2015-07-04 ENCOUNTER — Encounter: Payer: Self-pay | Admitting: Sports Medicine

## 2015-07-04 VITALS — Ht 74.0 in | Wt 175.0 lb

## 2015-07-04 DIAGNOSIS — M1711 Unilateral primary osteoarthritis, right knee: Secondary | ICD-10-CM

## 2015-07-04 MED ORDER — SODIUM HYALURONATE (VISCOSUP) 25 MG/2.5ML IX SOSY
2.5000 mL | PREFILLED_SYRINGE | Freq: Once | INTRA_ARTICULAR | Status: AC
  Administered 2015-07-04: 2.5 mL via INTRA_ARTICULAR

## 2015-07-04 NOTE — Progress Notes (Signed)
Patient ID: EVELYN MOCH, male   DOB: 06/17/1941, 75 y.o.   MRN: [id number]  Patient comes in today to start a new round of Supartz in his right knee. Well document history of DJD. Recent x-rays showed persistent moderately advanced medial compartmental DJD. This is only slightly worse than his previous films. He does have an appointment in February with Dr.[name] to discuss total knee arthroplasty versus unicompartmental replacement.  Patient's first Supartz injection was performed today atraumatically under sterile technique after consent was obtained. This was done using an anterior medial approach. Tolerated this without difficulty. Follow-up next week for his second injection in the overall series of 3.  Consent obtained and verified. Time-out conducted. Noted no overlying erythema, induration, or other signs of local infection. Skin prepped in a sterile fashion. Topical analgesic spray: Ethyl chloride. Joint: right knee Needle: 22g 1.5 inch Completed without difficulty. Meds: 3cc 1% xylocaine followed by one vial of Supartz  Advised to call if fevers/chills, erythema, induration, drainage, or persistent bleeding.

## 2015-07-12 ENCOUNTER — Ambulatory Visit (INDEPENDENT_AMBULATORY_CARE_PROVIDER_SITE_OTHER): Payer: Medicare Other | Admitting: Sports Medicine

## 2015-07-12 ENCOUNTER — Encounter: Payer: Self-pay | Admitting: Sports Medicine

## 2015-07-12 VITALS — BP 137/74 | Ht 74.0 in | Wt 175.0 lb

## 2015-07-12 DIAGNOSIS — M1711 Unilateral primary osteoarthritis, right knee: Secondary | ICD-10-CM | POA: Diagnosis present

## 2015-07-12 MED ORDER — SODIUM HYALURONATE (VISCOSUP) 25 MG/2.5ML IX SOSY
2.5000 mL | PREFILLED_SYRINGE | Freq: Once | INTRA_ARTICULAR | Status: AC
  Administered 2015-07-12: 2.5 mL via INTRA_ARTICULAR

## 2015-07-12 NOTE — Progress Notes (Signed)
   Subjective:    Patient ID: David Buck, male    DOB: 08-06-1940, 75 y.o.   MRN: [id number]  HPI   Patient comes in today for his second Supartz injection into his right knee. He tolerated the first injection without any difficulty. He is without complaint today.    Review of Systems     Objective:   Physical Exam Well-developed, well-nourished. No acute distress  Right knee: Range of motion is 0-100. No effusion. No erythema. Slight tenderness to palpation along the medial joint line. Good ligamentous stability. Walking with a slight limp.       Assessment & Plan:  Right knee pain secondary to medial compartmental DJD  Patient's right knee is injected today with his second Supartz injection. This was accomplished atraumatically under sterile technique utilizing an anterior medial approach after risks and benefits were explained. A flash of synovial fluid was obtained prior to injection. He tolerated this without difficulty. Follow-up next week for his third and final injection. Call with questions or concerns in the interim.  Consent obtained and verified. Time-out conducted. Noted no overlying erythema, induration, or other signs of local infection. Skin prepped in a sterile fashion. Topical analgesic spray: Ethyl chloride. Joint: right knee, anterior medial approach Needle: 22g 1.5 inch Completed without difficulty. Meds: 3cc 1% xylocaine followed by 1 vial of Supartz  Advised to call if fevers/chills, erythema, induration, drainage, or persistent bleeding.

## 2015-07-18 ENCOUNTER — Other Ambulatory Visit: Payer: Self-pay | Admitting: Internal Medicine

## 2015-07-18 ENCOUNTER — Encounter: Payer: Self-pay | Admitting: Sports Medicine

## 2015-07-18 ENCOUNTER — Ambulatory Visit (INDEPENDENT_AMBULATORY_CARE_PROVIDER_SITE_OTHER): Payer: Medicare Other | Admitting: Sports Medicine

## 2015-07-18 VITALS — BP 117/68 | Ht 74.0 in | Wt 175.0 lb

## 2015-07-18 DIAGNOSIS — M1711 Unilateral primary osteoarthritis, right knee: Secondary | ICD-10-CM

## 2015-07-18 MED ORDER — SODIUM HYALURONATE (VISCOSUP) 25 MG/2.5ML IX SOSY
2.5000 mL | PREFILLED_SYRINGE | Freq: Once | INTRA_ARTICULAR | Status: AC
  Administered 2015-07-18: 2.5 mL via INTRA_ARTICULAR

## 2015-07-18 NOTE — Progress Notes (Signed)
   Subjective:    Patient ID: David Buck, male    DOB: [birth year], 75 y.o.   MRN: [id number]  HPI   Patient comes in today for his third Supartz injection into his right knee. Unfortunately, he has yet to really experience any symptom relief. His primary complaint today is an inability to completely flex his right knee along with some vague discomfort in the posterior aspect of the knee. No significant swelling. He has an appointment scheduled with Dr. Wynelle Link in one month to discuss merits of a unicompartmental replacement versus total knee arthroplasty.    Review of Systems     Objective:   Physical Exam Well-developed, well-nourished. No acute distress  Right knee: Range of motion is 0-100. No effusion. No erythema. No soft tissue swelling. Neurovascularly intact distally. Walking with a slight limp       Assessment & Plan:  Right knee pain secondary to advanced medial compartmental DJD  Patient's right knee was injected today with his third Supartz injection. This was accomplished atraumatically under sterile technique. An anterior medial approach was utilized. He tolerates this without difficulty. He will follow-up with Dr.[name] as scheduled. We did briefly discuss the possibility of doing a couple more injections in 4-6 weeks to bring the total number of injections up to 5, but if the patient has not had any significant relief in his symptoms after these first 3 injections, then I do not think he would benefit from further Viscosupplementation. Follow-up with me as needed.  Consent obtained and verified. Time-out conducted. Noted no overlying erythema, induration, or other signs of local infection. Skin prepped in a sterile fashion. Topical analgesic spray: Ethyl chloride. Joint: right knee Needle: 22g 1.5 inch Completed without difficulty. Meds: 3cc 1% xylocaine followed by one vial of Supartz  Advised to call if fevers/chills, erythema, induration, drainage, or  persistent bleeding.

## 2015-07-19 NOTE — Telephone Encounter (Signed)
Do you want to make any changes in medication before filling this?  Last TSH (3 months ago) was on the verge of being high.  Just checking!  Please advise.

## 2015-08-01 ENCOUNTER — Encounter: Payer: Self-pay | Admitting: Internal Medicine

## 2015-08-01 ENCOUNTER — Ambulatory Visit (INDEPENDENT_AMBULATORY_CARE_PROVIDER_SITE_OTHER): Payer: Medicare Other | Admitting: Internal Medicine

## 2015-08-01 ENCOUNTER — Ambulatory Visit (INDEPENDENT_AMBULATORY_CARE_PROVIDER_SITE_OTHER)
Admission: RE | Admit: 2015-08-01 | Discharge: 2015-08-01 | Disposition: A | Discharge status: Home / Self Care | Payer: Medicare Other | Source: Ambulatory Visit | Attending: Internal Medicine | Admitting: Internal Medicine

## 2015-08-01 VITALS — BP 114/74 | HR 108 | Temp 98.5°F | Wt 167.0 lb

## 2015-08-01 DIAGNOSIS — R6882 Decreased libido: Secondary | ICD-10-CM

## 2015-08-01 DIAGNOSIS — E038 Other specified hypothyroidism: Secondary | ICD-10-CM

## 2015-08-01 DIAGNOSIS — R5382 Chronic fatigue, unspecified: Secondary | ICD-10-CM

## 2015-08-01 DIAGNOSIS — R5383 Other fatigue: Secondary | ICD-10-CM | POA: Insufficient documentation

## 2015-08-01 DIAGNOSIS — J189 Pneumonia, unspecified organism: Secondary | ICD-10-CM | POA: Insufficient documentation

## 2015-08-01 DIAGNOSIS — M1711 Unilateral primary osteoarthritis, right knee: Secondary | ICD-10-CM

## 2015-08-01 DIAGNOSIS — E034 Atrophy of thyroid (acquired): Secondary | ICD-10-CM

## 2015-08-01 DIAGNOSIS — G459 Transient cerebral ischemic attack, unspecified: Secondary | ICD-10-CM | POA: Diagnosis not present

## 2015-08-01 DIAGNOSIS — J069 Acute upper respiratory infection, unspecified: Secondary | ICD-10-CM

## 2015-08-01 DIAGNOSIS — R509 Fever, unspecified: Secondary | ICD-10-CM | POA: Diagnosis not present

## 2015-08-01 DIAGNOSIS — N320 Bladder-neck obstruction: Secondary | ICD-10-CM

## 2015-08-01 DIAGNOSIS — N390 Urinary tract infection, site not specified: Secondary | ICD-10-CM | POA: Insufficient documentation

## 2015-08-01 DIAGNOSIS — R05 Cough: Secondary | ICD-10-CM | POA: Diagnosis not present

## 2015-08-01 MED ORDER — LEVOFLOXACIN 500 MG PO TABS: 500.0000 mg | ORAL_TABLET | Freq: Every day | ORAL | Status: DC

## 2015-08-01 MED ORDER — AZITHROMYCIN 250 MG PO TABS: ORAL_TABLET | ORAL | Status: DC

## 2015-08-01 NOTE — Progress Notes (Signed)
Pre visit review using our clinic review tool, if applicable. No additional management support is needed unless otherwise documented below in the visit note. 

## 2015-08-01 NOTE — Assessment & Plan Note (Signed)
2/17 check testosterone later Improve diet, lifestyle changes

## 2015-08-01 NOTE — Assessment & Plan Note (Signed)
No relapse 

## 2015-08-01 NOTE — Assessment & Plan Note (Signed)
F/u w/Ortho

## 2015-08-01 NOTE — Assessment & Plan Note (Signed)
2/17 R CXR Stop Z pac and start Levaquin x10 d

## 2015-08-01 NOTE — Assessment & Plan Note (Signed)
On Levothroid 

## 2015-08-01 NOTE — Progress Notes (Signed)
Subjective:  Patient ID: David Buck, male    DOB: 06-Dec-1940  Age: 75 y.o. MRN: [id number]  CC: No chief complaint on file.   HPI ADOLPHO MEENACH presents for R knee OA - on Aleve x 1 mo. C/o fever, cough, yellow-brown sputum x 1 week - worse.  Outpatient Prescriptions Prior to Visit  Medication Sig Dispense Refill  . aspirin (BAYER ASPIRIN) 325 MG tablet Take 0.5 tablets (162 mg total) by mouth daily. 100 tablet 3  . Cholecalciferol (EQL VITAMIN D3) 1000 UNITS tablet Take 2,000 Units by mouth daily.     . fluticasone (FLONASE) 50 MCG/ACT nasal spray Place 2 sprays into the nose daily. As needed    . Glucosamine HCl 1000 MG TABS Take 1 tablet by mouth daily.      . hydrochlorothiazide (MICROZIDE) 12.5 MG capsule TAKE 1 CAPSULE (12.5 MG TOTAL) BY MOUTH DAILY. 60 capsule 5  . levothyroxine (SYNTHROID, LEVOTHROID) 75 MCG tablet TAKE ONE TABLET BY MOUTH DAILY 60 tablet 4  . loratadine (CLARITIN) 10 MG tablet Take 10 mg by mouth daily.     . multivitamin (THERAGRAN) per tablet Take 1 tablet by mouth daily.      . saw palmetto 160 MG capsule Take 160 mg by mouth daily.      . vitamin C (ASCORBIC ACID) 500 MG tablet Take 500 mg by mouth daily.       No facility-administered medications prior to visit.    ROS Review of Systems  Constitutional: Positive for fever, chills, diaphoresis and fatigue. Negative for appetite change and unexpected weight change.  HENT: Positive for congestion, dental problem and sinus pressure. Negative for nosebleeds, sneezing, sore throat and trouble swallowing.   Eyes: Negative for itching and visual disturbance.  Respiratory: Positive for cough. Negative for wheezing.   Cardiovascular: Negative for chest pain, palpitations and leg swelling.  Gastrointestinal: Negative for nausea, diarrhea, blood in stool and abdominal distention.  Genitourinary: Negative for frequency and hematuria.  Musculoskeletal: Negative for back pain, joint swelling, gait problem  and neck pain.  Skin: Negative for rash.  Neurological: Negative for dizziness, tremors, speech difficulty and weakness.  Psychiatric/Behavioral: Negative for sleep disturbance, dysphoric mood and agitation. The patient is not nervous/anxious.     Objective:  BP 114/74 mmHg  Pulse 108  Temp(Src) 98.5 F (36.9 C) (Oral)  Wt 167 lb (75.751 kg)  SpO2 97%  BP Readings from Last 3 Encounters:  08/01/15 114/74  07/18/15 117/68  07/12/15 137/74    Wt Readings from Last 3 Encounters:  08/01/15 167 lb (75.751 kg)  07/18/15 175 lb (79.379 kg)  07/12/15 175 lb (79.379 kg)    Physical Exam  Constitutional: He is oriented to person, place, and time. He appears well-developed. No distress.  NAD  HENT:  Mouth/Throat: Oropharynx is clear and moist.  Eyes: Conjunctivae are normal. Pupils are equal, round, and reactive to light.  Neck: Normal range of motion. No JVD present. No thyromegaly present.  Cardiovascular: Normal rate, regular rhythm, normal heart sounds and intact distal pulses.  Exam reveals no gallop and no friction rub.   No murmur heard. Pulmonary/Chest: Effort normal and breath sounds normal. No respiratory distress. He has no wheezes. He has no rales. He exhibits no tenderness.  Abdominal: Soft. Bowel sounds are normal. He exhibits no distension and no mass. There is no tenderness. There is no rebound and no guarding.  Musculoskeletal: Normal range of motion. He exhibits tenderness. He exhibits no edema.  Lymphadenopathy:    He has no cervical adenopathy.  Neurological: He is alert and oriented to person, place, and time. He has normal reflexes. No cranial nerve deficit. He exhibits normal muscle tone. He displays a negative Romberg sign. Coordination and gait normal.  Skin: Skin is warm and dry. No rash noted.  Psychiatric: He has a normal mood and affect. His behavior is normal. Judgment and thought content normal.  eryth throat R knee w/pain  Lab Results  Component  Value Date   WBC 6.3 12/06/2014   HGB 13.5 12/06/2014   HCT 41.1 12/06/2014   PLT 238.0 12/06/2014   GLUCOSE 76 04/18/2015   CHOL 111 12/06/2014   TRIG 46.0 12/06/2014   HDL 48.00 12/06/2014   LDLCALC 54 12/06/2014   ALT 17 04/18/2015   AST 17 04/18/2015   NA 139 04/18/2015   K 3.5 04/18/2015   CL 103 04/18/2015   CREATININE 0.93 04/18/2015   BUN 22 04/18/2015   CO2 29 04/18/2015   TSH 4.46 04/18/2015   PSA 2.64 06/04/2014   INR 1.0 RATIO 04/07/2008   HGBA1C 5.8 10/26/2010    Dg Knee Complete 4 Views Right  06/01/2015  CLINICAL DATA:  Several week history of generalized right knee pain without known injury, tight sensation posteriorly; history of meniscectomy several years ago. EXAM: RIGHT KNEE - COMPLETE 4+ VIEW COMPARISON:  Right knee series of Nov 12, 2013 FINDINGS: There is moderate narrowing of the medial joint compartment. The lateral joint space is preserved. There is subarticular cystic change in the periphery of the medial tibial plateau however. The tibial plateaus are intact. There are small spurs arising from the superior and lateral articular margins of the patella. There is no joint effusion. There is no acute fracture nor dislocation. IMPRESSION: Moderate osteoarthritic change of the left knee centered on the medial joint compartment. There has been mild progression of the findings since the study of May 2015. MRI of the knee would be useful. Electronically Signed   By: David  Martinique M.D.   On: 06/01/2015 16:21    Assessment & Plan:   Diagnoses and all orders for this visit:  Transient cerebral ischemia, unspecified transient cerebral ischemia type -     CBC with Differential/Platelet; Future -     Basic metabolic panel; Future -     Hepatic function panel; Future -     Testosterone; Future -     Urinalysis; Future -     PSA; Future  Hypothyroidism due to acquired atrophy of thyroid -     CBC with Differential/Platelet; Future -     Basic metabolic panel;  Future -     Hepatic function panel; Future -     Testosterone; Future -     Urinalysis; Future -     PSA; Future  Chronic fatigue -     CBC with Differential/Platelet; Future -     Basic metabolic panel; Future -     Hepatic function panel; Future -     Testosterone; Future -     Urinalysis; Future -     PSA; Future -     DG Chest 2 View  Acute upper respiratory infection -     CBC with Differential/Platelet; Future -     Basic metabolic panel; Future -     Hepatic function panel; Future -     Testosterone; Future -     Urinalysis; Future -     PSA; Future -  DG Chest 2 View  Bladder neck obstruction -     PSA; Future  CAP (community acquired pneumonia)  Libido, decreased  Primary osteoarthritis of right knee  Other orders -     Discontinue: azithromycin (ZITHROMAX) 250 MG tablet; As directed -     levofloxacin (LEVAQUIN) 500 MG tablet; Take 1 tablet (500 mg total) by mouth daily.  I have discontinued Mr. Fahmy's azithromycin. I am also having him start on levofloxacin. Additionally, I am having him maintain his fluticasone, Glucosamine HCl, loratadine, multivitamin, vitamin C, Cholecalciferol, saw palmetto, hydrochlorothiazide, aspirin, and levothyroxine.  Meds ordered this encounter  Medications  . DISCONTD: azithromycin (ZITHROMAX) 250 MG tablet    Sig: As directed    Dispense:  6 tablet    Refill:  0  . levofloxacin (LEVAQUIN) 500 MG tablet    Sig: Take 1 tablet (500 mg total) by mouth daily.    Dispense:  10 tablet    Refill:  0     Follow-up: Return in about 6 weeks (around 09/12/2015) for a follow-up visit.  Walker Kehr, MD

## 2015-08-01 NOTE — Patient Instructions (Signed)
L Arginine (Arginmax)

## 2015-08-02 ENCOUNTER — Encounter: Payer: Self-pay | Admitting: Internal Medicine

## 2015-08-03 ENCOUNTER — Encounter: Payer: Self-pay | Admitting: Internal Medicine

## 2015-08-11 DIAGNOSIS — M1711 Unilateral primary osteoarthritis, right knee: Secondary | ICD-10-CM | POA: Diagnosis not present

## 2015-08-12 ENCOUNTER — Encounter: Payer: Self-pay | Admitting: Sports Medicine

## 2015-08-17 ENCOUNTER — Other Ambulatory Visit (INDEPENDENT_AMBULATORY_CARE_PROVIDER_SITE_OTHER): Payer: Medicare Other

## 2015-08-17 DIAGNOSIS — E038 Other specified hypothyroidism: Secondary | ICD-10-CM

## 2015-08-17 DIAGNOSIS — I10 Essential (primary) hypertension: Secondary | ICD-10-CM | POA: Diagnosis not present

## 2015-08-17 DIAGNOSIS — R5382 Chronic fatigue, unspecified: Secondary | ICD-10-CM

## 2015-08-17 DIAGNOSIS — G459 Transient cerebral ischemic attack, unspecified: Secondary | ICD-10-CM

## 2015-08-17 DIAGNOSIS — E034 Atrophy of thyroid (acquired): Secondary | ICD-10-CM

## 2015-08-17 DIAGNOSIS — J069 Acute upper respiratory infection, unspecified: Secondary | ICD-10-CM

## 2015-08-17 DIAGNOSIS — N320 Bladder-neck obstruction: Secondary | ICD-10-CM | POA: Diagnosis not present

## 2015-08-17 LAB — HEPATIC FUNCTION PANEL
ALBUMIN: 4.1 g/dL (ref 3.5–5.2)
ALT: 24 U/L (ref 0–53)
AST: 21 U/L (ref 0–37)
Alkaline Phosphatase: 62 U/L (ref 39–117)
BILIRUBIN DIRECT: 0.2 mg/dL (ref 0.0–0.3)
TOTAL PROTEIN: 6.5 g/dL (ref 6.0–8.3)
Total Bilirubin: 0.7 mg/dL (ref 0.2–1.2)

## 2015-08-17 LAB — CBC WITH DIFFERENTIAL/PLATELET
Basophils Absolute: 0.1 10*3/uL (ref 0.0–0.1)
Basophils Relative: 0.9 % (ref 0.0–3.0)
Eosinophils Absolute: 0.5 10*3/uL (ref 0.0–0.7)
Eosinophils Relative: 5.8 % — ABNORMAL HIGH (ref 0.0–5.0)
HEMATOCRIT: 39.3 % (ref 39.0–52.0)
Hemoglobin: 13.2 g/dL (ref 13.0–17.0)
LYMPHS ABS: 1.6 10*3/uL (ref 0.7–4.0)
LYMPHS PCT: 20.3 % (ref 12.0–46.0)
MCHC: 33.6 g/dL (ref 30.0–36.0)
MCV: 93.4 fl (ref 78.0–100.0)
Monocytes Absolute: 0.6 10*3/uL (ref 0.1–1.0)
Monocytes Relative: 7.2 % (ref 3.0–12.0)
NEUTROS ABS: 5.2 10*3/uL (ref 1.4–7.7)
NEUTROS PCT: 65.8 % (ref 43.0–77.0)
PLATELETS: 342 10*3/uL (ref 150.0–400.0)
RBC: 4.21 Mil/uL — AB (ref 4.22–5.81)
RDW: 13.6 % (ref 11.5–15.5)
WBC: 7.9 10*3/uL (ref 4.0–10.5)

## 2015-08-17 LAB — PSA: PSA: 2.57 ng/mL (ref 0.10–4.00)

## 2015-08-17 LAB — BASIC METABOLIC PANEL
BUN: 22 mg/dL (ref 6–23)
CALCIUM: 9.7 mg/dL (ref 8.4–10.5)
CO2: 30 meq/L (ref 19–32)
CREATININE: 0.88 mg/dL (ref 0.40–1.50)
Chloride: 102 mEq/L (ref 96–112)
GFR: 90.09 mL/min (ref >60.00)
Glucose, Bld: 107 mg/dL — ABNORMAL HIGH (ref 70–99)
Potassium: 3.8 mEq/L (ref 3.5–5.1)
Sodium: 138 mEq/L (ref 135–145)

## 2015-08-17 LAB — URINALYSIS
BILIRUBIN URINE: NEGATIVE
KETONES UR: NEGATIVE
Leukocytes, UA: NEGATIVE
Nitrite: NEGATIVE
Specific Gravity, Urine: 1.01 (ref 1.000–1.030)
TOTAL PROTEIN, URINE-UPE24: NEGATIVE
URINE GLUCOSE: NEGATIVE
UROBILINOGEN UA: 0.2 (ref 0.0–1.0)
pH: 6.5 (ref 5.0–8.0)

## 2015-08-17 LAB — TESTOSTERONE: TESTOSTERONE: 471.43 ng/dL (ref 300.00–890.00)

## 2015-09-02 ENCOUNTER — Other Ambulatory Visit: Payer: Self-pay | Admitting: Internal Medicine

## 2015-09-12 ENCOUNTER — Ambulatory Visit (INDEPENDENT_AMBULATORY_CARE_PROVIDER_SITE_OTHER): Payer: Medicare Other | Admitting: Internal Medicine

## 2015-09-12 ENCOUNTER — Encounter: Payer: Self-pay | Admitting: Internal Medicine

## 2015-09-12 VITALS — BP 134/92 | HR 93 | Temp 97.7°F | Wt 173.0 lb

## 2015-09-12 DIAGNOSIS — J189 Pneumonia, unspecified organism: Secondary | ICD-10-CM | POA: Diagnosis not present

## 2015-09-12 DIAGNOSIS — C3490 Malignant neoplasm of unspecified part of unspecified bronchus or lung: Secondary | ICD-10-CM

## 2015-09-12 DIAGNOSIS — Z720 Tobacco use: Secondary | ICD-10-CM | POA: Diagnosis not present

## 2015-09-12 DIAGNOSIS — R3129 Other microscopic hematuria: Secondary | ICD-10-CM

## 2015-09-12 DIAGNOSIS — I10 Essential (primary) hypertension: Secondary | ICD-10-CM

## 2015-09-12 DIAGNOSIS — F17200 Nicotine dependence, unspecified, uncomplicated: Secondary | ICD-10-CM

## 2015-09-12 DIAGNOSIS — F17210 Nicotine dependence, cigarettes, uncomplicated: Secondary | ICD-10-CM

## 2015-09-12 DIAGNOSIS — J441 Chronic obstructive pulmonary disease with (acute) exacerbation: Secondary | ICD-10-CM

## 2015-09-12 DIAGNOSIS — J439 Emphysema, unspecified: Secondary | ICD-10-CM

## 2015-09-12 DIAGNOSIS — Z1509 Genetic susceptibility to other malignant neoplasm: Secondary | ICD-10-CM

## 2015-09-12 DIAGNOSIS — G459 Transient cerebral ischemic attack, unspecified: Secondary | ICD-10-CM

## 2015-09-12 NOTE — Assessment & Plan Note (Addendum)
F/u CXR discussed w/pt and dtr Low dose chest CT

## 2015-09-12 NOTE — Assessment & Plan Note (Signed)
Low dose chest CT

## 2015-09-12 NOTE — Progress Notes (Signed)
Pre visit review using our clinic review tool, if applicable. No additional management support is needed unless otherwise documented below in the visit note. 

## 2015-09-12 NOTE — Assessment & Plan Note (Signed)
On ASA 

## 2015-09-12 NOTE — Progress Notes (Signed)
Subjective:  Patient ID: David Buck, male    DOB: 1940/11/09  Age: 75 y.o. MRN: [id number]  CC: No chief complaint on file.   HPI David Buck presents for a CAP f/u - feeling well. F/u COPD, tobacco use, OA.  Outpatient Prescriptions Prior to Visit  Medication Sig Dispense Refill  . aspirin (BAYER ASPIRIN) 325 MG tablet Take 0.5 tablets (162 mg total) by mouth daily. 100 tablet 3  . Cholecalciferol (EQL VITAMIN D3) 1000 UNITS tablet Take 2,000 Units by mouth daily.     . fluticasone (FLONASE) 50 MCG/ACT nasal spray Place 2 sprays into the nose daily. As needed    . Glucosamine HCl 1000 MG TABS Take 1 tablet by mouth daily.      Marland Kitchen levothyroxine (SYNTHROID, LEVOTHROID) 75 MCG tablet TAKE ONE TABLET BY MOUTH DAILY 60 tablet 4  . loratadine (CLARITIN) 10 MG tablet Take 10 mg by mouth daily.     . multivitamin (THERAGRAN) per tablet Take 1 tablet by mouth daily.      . saw palmetto 160 MG capsule Take 160 mg by mouth daily.      . vitamin C (ASCORBIC ACID) 500 MG tablet Take 500 mg by mouth daily.      . hydrochlorothiazide (MICROZIDE) 12.5 MG capsule TAKE 1 CAPSULE (12.5 MG TOTAL) BY MOUTH DAILY. 60 capsule 5  . hydrochlorothiazide (MICROZIDE) 12.5 MG capsule TAKE 1 CAPSULE (12.5 MG TOTAL) BY MOUTH DAILY. 60 capsule 5  . levofloxacin (LEVAQUIN) 500 MG tablet Take 1 tablet (500 mg total) by mouth daily. (Patient not taking: Reported on 09/12/2015) 10 tablet 0   No facility-administered medications prior to visit.    ROS Review of Systems  Constitutional: Negative for appetite change, fatigue and unexpected weight change.  HENT: Negative for congestion, nosebleeds, sneezing, sore throat and trouble swallowing.   Eyes: Negative for itching and visual disturbance.  Respiratory: Negative for cough.   Cardiovascular: Negative for chest pain, palpitations and leg swelling.  Gastrointestinal: Negative for nausea, diarrhea, blood in stool and abdominal distention.  Genitourinary:  Negative for frequency and hematuria.  Musculoskeletal: Positive for arthralgias and gait problem. Negative for back pain, joint swelling and neck pain.  Skin: Negative for rash.  Neurological: Negative for dizziness, tremors, speech difficulty and weakness.  Psychiatric/Behavioral: Negative for suicidal ideas, sleep disturbance, dysphoric mood and agitation. The patient is not nervous/anxious.     Objective:  BP 134/92 mmHg  Pulse 93  Temp(Src) 97.7 F (36.5 C) (Oral)  Wt 173 lb (78.472 kg)  SpO2 94%  BP Readings from Last 3 Encounters:  09/12/15 134/92  08/01/15 114/74  07/18/15 117/68    Wt Readings from Last 3 Encounters:  09/12/15 173 lb (78.472 kg)  08/01/15 167 lb (75.751 kg)  07/18/15 175 lb (79.379 kg)    Physical Exam  Constitutional: He is oriented to person, place, and time. He appears well-developed. No distress.  NAD  HENT:  Mouth/Throat: Oropharynx is clear and moist.  Eyes: Conjunctivae are normal. Pupils are equal, round, and reactive to light.  Neck: Normal range of motion. No JVD present. No thyromegaly present.  Cardiovascular: Normal rate, regular rhythm, normal heart sounds and intact distal pulses.  Exam reveals no gallop and no friction rub.   No murmur heard. Pulmonary/Chest: Effort normal and breath sounds normal. No respiratory distress. He has no wheezes. He has no rales. He exhibits no tenderness.  Abdominal: Soft. Bowel sounds are normal. He exhibits no distension and no  mass. There is no tenderness. There is no rebound and no guarding.  Musculoskeletal: Normal range of motion. He exhibits tenderness. He exhibits no edema.  Lymphadenopathy:    He has no cervical adenopathy.  Neurological: He is alert and oriented to person, place, and time. He has normal reflexes. No cranial nerve deficit. He exhibits normal muscle tone. He displays a negative Romberg sign. Coordination and gait normal.  Skin: Skin is warm and dry. No rash noted.  Psychiatric:  He has a normal mood and affect. His behavior is normal. Judgment and thought content normal.  L knee pain  Lab Results  Component Value Date   WBC 7.9 08/17/2015   HGB 13.2 08/17/2015   HCT 39.3 08/17/2015   PLT 342.0 08/17/2015   GLUCOSE 107* 08/17/2015   CHOL 111 12/06/2014   TRIG 46.0 12/06/2014   HDL 48.00 12/06/2014   LDLCALC 54 12/06/2014   ALT 24 08/17/2015   AST 21 08/17/2015   NA 138 08/17/2015   K 3.8 08/17/2015   CL 102 08/17/2015   CREATININE 0.88 08/17/2015   BUN 22 08/17/2015   CO2 30 08/17/2015   TSH 4.46 04/18/2015   PSA 2.57 08/17/2015   INR 1.0 RATIO 04/07/2008   HGBA1C 5.8 10/26/2010    Dg Chest 2 View  08/01/2015  CLINICAL DATA:  Cough and congestion as well as low grade fever for 1 week. History of COPD and smoking. EXAM: CHEST  2 VIEW COMPARISON:  02/01/2014 FINDINGS: Lungs are hyperexpanded. There are thickened interstitial markings, similar to prior study as well as upper lobe scarring, right greater than left. In addition, there are interstitial type densities which project anteriorly in the mid to lower lung on the lateral view, which appear to correspond to the right upper lobe. Interstitial pneumonia is suspected. There is no pleural effusion or pneumothorax. Cardiac silhouette is normal in size and configuration. No mediastinal or hilar masses or evidence of adenopathy Bony thorax is demineralized but intact. IMPRESSION: 1. Interstitial densities in the right upper lobe suggest interstitial pneumonia. 2. Underlying COPD. Electronically Signed   By: Lajean Manes M.D.   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 Devine's levofloxacin. I am also having him maintain his fluticasone, Glucosamine HCl, loratadine, multivitamin, vitamin C, Cholecalciferol, saw palmetto, aspirin, levothyroxine, and hydrochlorothiazide.  Meds ordered this encounter  Medications  . hydrochlorothiazide (HYDRODIURIL) 25 MG tablet    Sig: Take 25 mg by mouth daily.     Follow-up: Return in about 3 months (around 12/13/2015) for a follow-up visit.  Walker Kehr, MD

## 2015-09-12 NOTE — Assessment & Plan Note (Signed)
Controlled  On HCTZ

## 2015-09-21 ENCOUNTER — Ambulatory Visit (INDEPENDENT_AMBULATORY_CARE_PROVIDER_SITE_OTHER)
Admission: RE | Admit: 2015-09-21 | Discharge: 2015-09-21 | Disposition: A | Discharge status: Home / Self Care | Payer: Medicare Other | Source: Ambulatory Visit | Attending: Internal Medicine | Admitting: Internal Medicine

## 2015-09-21 DIAGNOSIS — F17210 Nicotine dependence, cigarettes, uncomplicated: Secondary | ICD-10-CM

## 2015-09-21 DIAGNOSIS — Z87891 Personal history of nicotine dependence: Secondary | ICD-10-CM | POA: Diagnosis not present

## 2015-09-22 DIAGNOSIS — M1711 Unilateral primary osteoarthritis, right knee: Secondary | ICD-10-CM | POA: Diagnosis not present

## 2015-09-26 DIAGNOSIS — N400 Enlarged prostate without lower urinary tract symptoms: Secondary | ICD-10-CM | POA: Diagnosis not present

## 2015-09-26 DIAGNOSIS — Z0000 Encounter for general adult medical examination without abnormal findings: Secondary | ICD-10-CM | POA: Diagnosis not present

## 2015-09-29 ENCOUNTER — Ambulatory Visit (INDEPENDENT_AMBULATORY_CARE_PROVIDER_SITE_OTHER): Payer: Medicare Other | Admitting: Sports Medicine

## 2015-09-29 ENCOUNTER — Encounter: Payer: Self-pay | Admitting: Sports Medicine

## 2015-09-29 VITALS — BP 139/88 | HR 73 | Ht 74.0 in | Wt 173.0 lb

## 2015-09-29 DIAGNOSIS — M1711 Unilateral primary osteoarthritis, right knee: Secondary | ICD-10-CM

## 2015-09-30 NOTE — Progress Notes (Signed)
Patient ID: David Buck, male   DOB: 08-Sep-1940, 75 y.o.   MRN: [id number]  Patient comes in today to discuss chronic right knee pain secondary to osteoarthritis. Patient has recently seen Dr. Wynelle Link. It does not sound like he is a candidate for a unicompartmental replacement. Instead, a total knee replacement will be needed when the time comes. Patient had several questions regarding the progression of osteoarthritis and the decision to proceed with surgery. I explained to him that the decision to proceed with surgery is his own. When his symptoms affect his quality of life enough that he is ready to proceed, then that is the time to go ahead with surgery. He is also wondering about other intra-articular processes such as a meniscal tear which, up to this point, have not been evaluated. Specifically, he would like to proceed with an MRI which I think is reasonable for a couple of different reasons. It will definitely evaluate the meniscus but it will also give a better look at the articular surface of his knee joint to see whether or not most of his arthritis is in fact in the medial compartment or more diffuse. I've explained to him that regardless of the MRI findings, I think he should stick with Dr.[name]'s expertise and that a total knee replacement is likely the definitive treatment for his knee pain. He understands. I will call him after I review his MRI.  Total time spent with the patient was 15 minutes with greater than 50% of the time spent in face-to-face consultation discussing his diagnosis and treatment.

## 2015-10-06 ENCOUNTER — Ambulatory Visit
Admission: RE | Admit: 2015-10-06 | Discharge: 2015-10-06 | Disposition: A | Discharge status: Home / Self Care | Payer: Medicare Other | Source: Ambulatory Visit | Attending: Sports Medicine | Admitting: Sports Medicine

## 2015-10-06 DIAGNOSIS — S83241A Other tear of medial meniscus, current injury, right knee, initial encounter: Secondary | ICD-10-CM | POA: Diagnosis not present

## 2015-10-06 DIAGNOSIS — M1711 Unilateral primary osteoarthritis, right knee: Secondary | ICD-10-CM

## 2015-10-11 ENCOUNTER — Telehealth: Payer: Self-pay | Admitting: Sports Medicine

## 2015-10-11 NOTE — Telephone Encounter (Signed)
I spoke with the patient on the phone today after reviewing the MRI of his right knee. He has advanced degenerative changes in the medial compartment. Moderate degenerative changes in the patellofemoral and lateral compartments. He has a partial thickness degenerative tear of the medial meniscus. These changes are all in the setting of a prior partial medial meniscectomy. I've explained to the patient that based on these findings, the definitive treatment for his knee pain is a total knee arthroplasty. He has already established himself with Dr.[name] and will follow-up with him to discuss this further. He will follow-up with me as needed.

## 2015-10-17 ENCOUNTER — Ambulatory Visit: Payer: Medicare Other | Admitting: Internal Medicine

## 2015-10-18 DIAGNOSIS — M25561 Pain in right knee: Secondary | ICD-10-CM | POA: Diagnosis not present

## 2015-10-25 DIAGNOSIS — M25561 Pain in right knee: Secondary | ICD-10-CM | POA: Diagnosis not present

## 2015-11-01 DIAGNOSIS — M25561 Pain in right knee: Secondary | ICD-10-CM | POA: Diagnosis not present

## 2015-11-08 ENCOUNTER — Other Ambulatory Visit: Payer: Self-pay | Admitting: Internal Medicine

## 2015-11-08 DIAGNOSIS — M25561 Pain in right knee: Secondary | ICD-10-CM | POA: Diagnosis not present

## 2015-12-01 DIAGNOSIS — M1711 Unilateral primary osteoarthritis, right knee: Secondary | ICD-10-CM | POA: Diagnosis not present

## 2015-12-04 ENCOUNTER — Ambulatory Visit: Payer: Self-pay | Admitting: Orthopedic Surgery

## 2015-12-13 ENCOUNTER — Encounter: Payer: Self-pay | Admitting: Internal Medicine

## 2015-12-13 ENCOUNTER — Ambulatory Visit (INDEPENDENT_AMBULATORY_CARE_PROVIDER_SITE_OTHER): Payer: Medicare Other | Admitting: Internal Medicine

## 2015-12-13 VITALS — BP 120/80 | HR 71 | Wt 173.0 lb

## 2015-12-13 DIAGNOSIS — F17200 Nicotine dependence, unspecified, uncomplicated: Secondary | ICD-10-CM | POA: Diagnosis not present

## 2015-12-13 DIAGNOSIS — I10 Essential (primary) hypertension: Secondary | ICD-10-CM | POA: Diagnosis not present

## 2015-12-13 DIAGNOSIS — I2510 Atherosclerotic heart disease of native coronary artery without angina pectoris: Secondary | ICD-10-CM

## 2015-12-13 DIAGNOSIS — Z01818 Encounter for other preprocedural examination: Secondary | ICD-10-CM | POA: Diagnosis not present

## 2015-12-13 NOTE — Assessment & Plan Note (Signed)
6/17 Mr David Buck should be clear for surgery if his cardiac stress test (ordered) and lab result (pending) are acceptable. He will try to cut back on smoking (currently he is at 1 PPD). He may need to use bronchodilators perioperatively. Thank you!

## 2015-12-13 NOTE — Progress Notes (Signed)
Pre visit review using our clinic review tool, if applicable. No additional management support is needed unless otherwise documented below in the visit note. 

## 2015-12-13 NOTE — Assessment & Plan Note (Signed)
Controlled  On HCTZ

## 2015-12-13 NOTE — Progress Notes (Signed)
Subjective:  Patient ID: David Buck, male    DOB: 10-21-1940  Age: 75 y.o. MRN: [id number]  CC: No chief complaint on file.   HPI LENG MONTESDEOCA presents for a pre-op clearance Reason - med clearance for R TKR Jan 16 2016 Req by Dr Wynelle Link  Hx: F/u COPD, HTN, BPH - stable. Chest CT w/cardiac calcifications. He is a 1 PPD smoker   Past Medical History  Diagnosis Date  . Tinnitus   . Osteoarthritis of right knee     Dr Percell Miller  . Prostatitis   . History of hepatitis B     recovered  . COPD (chronic obstructive pulmonary disease) ([place])   . Hypothyroidism   . Meniere's disease   . LAFB (left anterior fascicular block) 2008    on EKG  . Allergy     rhinitis  . Posterior vitreous detachment 2011    R, body- Dr Baird Cancer   Past Surgical History  Procedure Laterality Date  . Inguinal hernia repair  2005    Dr Hassell Done  . Right knee menisc tear repair    . Bronchoscopy  04-14-08    benign    reports that he has been smoking Cigarettes.  He has been smoking about 1.00 pack per day. He has never used smokeless tobacco. He reports that he drinks alcohol. His drug history is not on file. family history includes Cancer in his mother; Hypertension in his other; Other in his father. Allergies  Allergen Reactions  . Cefdinir     REACTION: Rash      Outpatient Prescriptions Prior to Visit  Medication Sig Dispense Refill  . aspirin (BAYER ASPIRIN) 325 MG tablet Take 0.5 tablets (162 mg total) by mouth daily. 100 tablet 3  . Cholecalciferol (EQL VITAMIN D3) 1000 UNITS tablet Take 2,000 Units by mouth daily.     . fluticasone (FLONASE) 50 MCG/ACT nasal spray Place 2 sprays into the nose daily. As needed    . Glucosamine HCl 1000 MG TABS Take 1 tablet by mouth daily.      . hydrochlorothiazide (MICROZIDE) 12.5 MG capsule TAKE 1 CAPSULE (12.5 MG TOTAL) BY MOUTH DAILY. 90 capsule 3  . levothyroxine (SYNTHROID, LEVOTHROID) 75 MCG tablet TAKE ONE TABLET BY MOUTH DAILY 60 tablet 4   . loratadine (CLARITIN) 10 MG tablet Take 10 mg by mouth daily.     . multivitamin (THERAGRAN) per tablet Take 1 tablet by mouth daily.      . saw palmetto 160 MG capsule Take 160 mg by mouth daily.      . vitamin C (ASCORBIC ACID) 500 MG tablet Take 500 mg by mouth daily.      . hydrochlorothiazide (HYDRODIURIL) 25 MG tablet Take 25 mg by mouth daily. Reported on 12/13/2015     No facility-administered medications prior to visit.    ROS Review of Systems  Constitutional: Negative for appetite change, fatigue and unexpected weight change.  HENT: Negative for congestion, nosebleeds, sneezing, sore throat and trouble swallowing.   Eyes: Negative for itching and visual disturbance.  Respiratory: Negative for cough.   Cardiovascular: Negative for chest pain, palpitations and leg swelling.  Gastrointestinal: Negative for nausea, diarrhea, blood in stool and abdominal distention.  Genitourinary: Negative for frequency and hematuria.  Musculoskeletal: Positive for arthralgias and gait problem. Negative for back pain, joint swelling and neck pain.  Skin: Negative for rash.  Neurological: Negative for dizziness, tremors, speech difficulty and weakness.  Psychiatric/Behavioral: Negative for sleep disturbance, dysphoric  mood and agitation. The patient is not nervous/anxious.     Objective:  BP 140/80 mmHg  Pulse 71  Wt 173 lb (78.472 kg)  SpO2 96%  BP Readings from Last 3 Encounters:  12/13/15 140/80  09/29/15 139/88  09/12/15 134/92    Wt Readings from Last 3 Encounters:  12/13/15 173 lb (78.472 kg)  09/29/15 173 lb (78.472 kg)  09/12/15 173 lb (78.472 kg)    Physical Exam  Constitutional: He is oriented to person, place, and time. He appears well-developed. No distress.  NAD  HENT:  Mouth/Throat: Oropharynx is clear and moist.  Eyes: Conjunctivae are normal. Pupils are equal, round, and reactive to light.  Neck: Normal range of motion. No JVD present. No thyromegaly present.    Cardiovascular: Normal rate, regular rhythm, normal heart sounds and intact distal pulses.  Exam reveals no gallop and no friction rub.   No murmur heard. Pulmonary/Chest: Effort normal and breath sounds normal. No respiratory distress. He has no wheezes. He has no rales. He exhibits no tenderness.  Abdominal: Soft. Bowel sounds are normal. He exhibits no distension and no mass. There is no tenderness. There is no rebound and no guarding.  Musculoskeletal: Normal range of motion. He exhibits tenderness. He exhibits no edema.  Lymphadenopathy:    He has no cervical adenopathy.  Neurological: He is alert and oriented to person, place, and time. He has normal reflexes. No cranial nerve deficit. He exhibits normal muscle tone. He displays a negative Romberg sign. Coordination and gait normal.  Skin: Skin is warm and dry. No rash noted.  Psychiatric: He has a normal mood and affect. His behavior is normal. Judgment and thought content normal.  R knee is tender w/ROM   Procedure: EKG Indication: pre-op. CAD on chest CT Impression: NSR. No new changes.  Lab Results  Component Value Date   WBC 7.9 08/17/2015   HGB 13.2 08/17/2015   HCT 39.3 08/17/2015   PLT 342.0 08/17/2015   GLUCOSE 107* 08/17/2015   CHOL 111 12/06/2014   TRIG 46.0 12/06/2014   HDL 48.00 12/06/2014   LDLCALC 54 12/06/2014   ALT 24 08/17/2015   AST 21 08/17/2015   NA 138 08/17/2015   K 3.8 08/17/2015   CL 102 08/17/2015   CREATININE 0.88 08/17/2015   BUN 22 08/17/2015   CO2 30 08/17/2015   TSH 4.46 04/18/2015   PSA 2.57 08/17/2015   INR 1.0 RATIO 04/07/2008   HGBA1C 5.8 10/26/2010    Mr Knee Right Wo Contrast  10/07/2015  CLINICAL DATA:  Generalized knee pain for 6 months. History of prior knee surgery in 1997. EXAM: MRI OF THE RIGHT KNEE WITHOUT CONTRAST TECHNIQUE: Multiplanar, multisequence MR imaging of the knee was performed. No intravenous contrast was administered. COMPARISON:  Radiographs 06/01/2015  FINDINGS: MENISCI Medial meniscus: Postoperative changes from a prior partial medial meniscectomy. There is a complex partial thickness radial tear involving the posterior horn near the meniscal root. Lateral meniscus: Intact. Mild intrasubstance degenerative changes. LIGAMENTS Cruciates: Intact. Severe mucoid degeneration of the ACL with intrasubstance ganglion cyst. Collaterals:  Intact. CARTILAGE Patellofemoral:  Moderate degenerative chondrosis/ chondromalacia. Medial: Advanced degenerative chondrosis with areas of full-thickness cartilage loss involving the femur and tibia. Mild osteophytic spurring and subchondral cystic change. Lateral: Moderate degenerative chondrosis. Early joint space narrowing and osteophytic spurring. Joint:  Moderate to large joint effusion and moderate synovitis. Popliteal Fossa:  No popliteal mass or Baker's cyst. Extensor Mechanism: The patella retinacular structures are intact and the quadriceps  and patellar tendons are intact. There is significant patellar tendinopathy. Bones:  No acute bony findings. IMPRESSION: 1. Postoperative changes involving the mid body region of the medial meniscus. There is also a complex tear involving the posterior horn. 2. Intact ligamentous structures. Advanced mucoid degeneration of the ACL. 3. Tricompartmental degenerative changes most notable in the medial compartment. 4. Moderate to large joint effusion and moderate synovitis. 5. Patellar tendinopathy. Electronically Signed   By: Marijo Sanes M.D.   On: 10/07/2015 09:21    Assessment & Plan:   There are no diagnoses linked to this encounter. I am having Mr. Grillo maintain his fluticasone, Glucosamine HCl, loratadine, multivitamin, vitamin C, Cholecalciferol, saw palmetto, aspirin, levothyroxine, and hydrochlorothiazide.  No orders of the defined types were placed in this encounter.     Follow-up: No Follow-up on file.  Walker Kehr, MD

## 2015-12-13 NOTE — Assessment & Plan Note (Signed)
David Buck will try to cut back on smoking (currently he is at 1 PPD).

## 2015-12-13 NOTE — Assessment & Plan Note (Signed)
The pt will try to cut back on smoking (currently he is at 1 PPD). He may need to use bronchodilators perioperatively.

## 2015-12-20 ENCOUNTER — Telehealth (HOSPITAL_COMMUNITY): Payer: Self-pay | Admitting: *Deleted

## 2015-12-20 NOTE — Telephone Encounter (Signed)
Left message on voicemail in reference to upcoming appointment scheduled for 12/23/15. Phone number given for a call back so details instructions can be given.  Hubbard Robinson, RN

## 2015-12-21 ENCOUNTER — Telehealth (HOSPITAL_COMMUNITY): Payer: Self-pay | Admitting: Radiology

## 2015-12-21 NOTE — Telephone Encounter (Signed)
Patient given instructions :will arrive 10:45  11-26-15  . AY

## 2015-12-23 ENCOUNTER — Ambulatory Visit (HOSPITAL_COMMUNITY): Payer: Medicare Other | Attending: Cardiology

## 2015-12-23 DIAGNOSIS — Z720 Tobacco use: Secondary | ICD-10-CM | POA: Diagnosis not present

## 2015-12-23 DIAGNOSIS — I10 Essential (primary) hypertension: Secondary | ICD-10-CM | POA: Diagnosis not present

## 2015-12-23 DIAGNOSIS — Z01818 Encounter for other preprocedural examination: Secondary | ICD-10-CM | POA: Diagnosis not present

## 2015-12-23 DIAGNOSIS — I2510 Atherosclerotic heart disease of native coronary artery without angina pectoris: Secondary | ICD-10-CM | POA: Insufficient documentation

## 2015-12-23 DIAGNOSIS — I4510 Unspecified right bundle-branch block: Secondary | ICD-10-CM | POA: Insufficient documentation

## 2015-12-23 LAB — MYOCARDIAL PERFUSION IMAGING
CHL CUP NUCLEAR SDS: 1
CHL CUP NUCLEAR SRS: 3
CHL CUP NUCLEAR SSS: 4
CSEPPHR: 85 {beats}/min
LHR: 0.28
LV dias vol: 125 mL (ref 62–150)
LV sys vol: 55 mL
Rest HR: 57 {beats}/min
TID: 1.11

## 2015-12-23 MED ORDER — TECHNETIUM TC 99M TETROFOSMIN IV KIT
31.4000 | PACK | Freq: Once | INTRAVENOUS | Status: AC | PRN
  Administered 2015-12-23: 31 via INTRAVENOUS
  Filled 2015-12-23: qty 31

## 2015-12-23 MED ORDER — REGADENOSON 0.4 MG/5ML IV SOLN
0.4000 mg | Freq: Once | INTRAVENOUS | Status: AC
  Administered 2015-12-23: 0.4 mg via INTRAVENOUS

## 2015-12-23 MED ORDER — TECHNETIUM TC 99M TETROFOSMIN IV KIT
10.3000 | PACK | Freq: Once | INTRAVENOUS | Status: AC | PRN
  Administered 2015-12-23: 10 via INTRAVENOUS
  Filled 2015-12-23: qty 10

## 2016-01-06 ENCOUNTER — Encounter (HOSPITAL_COMMUNITY)
Admission: RE | Admit: 2016-01-06 | Discharge: 2016-01-06 | Disposition: A | Discharge status: Home / Self Care | Payer: Medicare Other | Source: Ambulatory Visit | Attending: Orthopedic Surgery | Admitting: Orthopedic Surgery

## 2016-01-06 ENCOUNTER — Encounter (HOSPITAL_COMMUNITY): Payer: Self-pay

## 2016-01-06 DIAGNOSIS — Z01812 Encounter for preprocedural laboratory examination: Secondary | ICD-10-CM | POA: Insufficient documentation

## 2016-01-06 DIAGNOSIS — M1711 Unilateral primary osteoarthritis, right knee: Secondary | ICD-10-CM | POA: Insufficient documentation

## 2016-01-06 HISTORY — DX: Pneumonia, unspecified organism: J18.9

## 2016-01-06 HISTORY — DX: Essential (primary) hypertension: I10

## 2016-01-06 LAB — URINE MICROSCOPIC-ADD ON: Bacteria, UA: NONE SEEN

## 2016-01-06 LAB — URINALYSIS, ROUTINE W REFLEX MICROSCOPIC
Bilirubin Urine: NEGATIVE
GLUCOSE, UA: NEGATIVE mg/dL
KETONES UR: NEGATIVE mg/dL
Leukocytes, UA: NEGATIVE
Nitrite: NEGATIVE
PROTEIN: NEGATIVE mg/dL
SPECIFIC GRAVITY, URINE: 1.008 (ref 1.005–1.030)
pH: 6 (ref 5.0–8.0)

## 2016-01-06 LAB — CBC
HEMATOCRIT: 42.3 % (ref 39.0–52.0)
Hemoglobin: 14.1 g/dL (ref 13.0–17.0)
MCH: 31.5 pg (ref 26.0–34.0)
MCHC: 33.3 g/dL (ref 30.0–36.0)
MCV: 94.6 fL (ref 78.0–100.0)
Platelets: 293 10*3/uL (ref 150–400)
RBC: 4.47 MIL/uL (ref 4.22–5.81)
RDW: 12.8 % (ref 11.5–15.5)
WBC: 6.6 10*3/uL (ref 4.0–10.5)

## 2016-01-06 LAB — PROTIME-INR
INR: 1.04 (ref 0.00–1.49)
PROTHROMBIN TIME: 13.4 s (ref 11.6–15.2)

## 2016-01-06 LAB — COMPREHENSIVE METABOLIC PANEL
ALT: 22 U/L (ref 17–63)
ANION GAP: 6 (ref 5–15)
AST: 25 U/L (ref 15–41)
Albumin: 4.8 g/dL (ref 3.5–5.0)
Alkaline Phosphatase: 59 U/L (ref 38–126)
BILIRUBIN TOTAL: 0.3 mg/dL (ref 0.3–1.2)
BUN: 23 mg/dL — ABNORMAL HIGH (ref 6–20)
CALCIUM: 9.8 mg/dL (ref 8.9–10.3)
CO2: 29 mmol/L (ref 22–32)
Chloride: 104 mmol/L (ref 101–111)
Creatinine, Ser: 0.97 mg/dL (ref 0.61–1.24)
Glucose, Bld: 82 mg/dL (ref 65–99)
POTASSIUM: 3.7 mmol/L (ref 3.5–5.1)
Sodium: 139 mmol/L (ref 135–145)
TOTAL PROTEIN: 7.7 g/dL (ref 6.5–8.1)

## 2016-01-06 LAB — SURGICAL PCR SCREEN
MRSA, PCR: NEGATIVE
Staphylococcus aureus: NEGATIVE

## 2016-01-06 LAB — APTT: APTT: 30 s (ref 24–37)

## 2016-01-06 LAB — ABO/RH: ABO/RH(D): A POS

## 2016-01-06 NOTE — Progress Notes (Signed)
U/A and micro along with BMP done 01/06/16 faxed via EPIC to Dr Wynelle Link.

## 2016-01-06 NOTE — Patient Instructions (Addendum)
David Buck  01/06/2016   Your procedure is scheduled on: 01/16/2016    Report to [facility] Main  Entrance take [place]  elevators to 3rd floor to  [place] at    [place] AM.  Call this number if you have problems the morning of surgery [phone]   Remember: ONLY 1 PERSON MAY GO WITH YOU TO SHORT STAY TO GET  READY MORNING OF [place].  Do not eat food or drink liquids :After Midnight.     Take these medicines the morning of surgery with A SIP OF WATER: Synthroid ( Levothyroxine)                                You may not have any metal on your body including hair pins and              piercings  Do not wear jewelry, , lotions, powders or perfumes, deodorant                        Men may shave face and neck.   Do not bring valuables to the hospital. [place].  Contacts, dentures or bridgework may not be worn into surgery.  Leave suitcase in the car. After surgery it may be brought to your room.       Special Instructions: coughing and deep breathing, exercises, leg exercises               Please read over the following fact sheets you were given: _____________________________________________________________________             [facility] - Preparing for Surgery Before surgery, you can play an important role.  Because skin is not sterile, your skin needs to be as free of germs as possible.  You can reduce the number of germs on your skin by washing with CHG (chlorahexidine gluconate) soap before surgery.  CHG is an antiseptic cleaner which kills germs and bonds with the skin to continue killing germs even after washing. Please DO NOT use if you have an allergy to CHG or antibacterial soaps.  If your skin becomes reddened/irritated stop using the CHG and inform your nurse when you arrive at Short Stay. Do not shave (including legs and underarms) for at least 48 hours prior to the first CHG  shower.  You may shave your face/neck. Please follow these instructions carefully:  1.  Shower with CHG Soap the night before surgery and the  morning of Surgery.  2.  If you choose to wash your hair, wash your hair first as usual with your  normal  shampoo.  3.  After you shampoo, rinse your hair and body thoroughly to remove the  shampoo.                           4.  Use CHG as you would any other liquid soap.  You can apply chg directly  to the skin and wash                       Gently with a scrungie or clean washcloth.  5.  Apply the  CHG Soap to your body ONLY FROM THE NECK DOWN.   Do not use on face/ open                           Wound or open sores. Avoid contact with eyes, ears mouth and genitals (private parts).                       Wash face,  Genitals (private parts) with your normal soap.             6.  Wash thoroughly, paying special attention to the area where your surgery  will be performed.  7.  Thoroughly rinse your body with warm water from the neck down.  8.  DO NOT shower/wash with your normal soap after using and rinsing off  the CHG Soap.                9.  Pat yourself dry with a clean towel.            10.  Wear clean pajamas.            11.  Place clean sheets on your bed the night of your first shower and do not  sleep with pets. Day of Surgery : Do not apply any lotions/deodorants the morning of surgery.  Please wear clean clothes to the hospital/surgery center.  FAILURE TO FOLLOW THESE INSTRUCTIONS MAY RESULT IN THE CANCELLATION OF YOUR SURGERY PATIENT SIGNATURE_________________________________  NURSE SIGNATURE__________________________________  ________________________________________________________________________  WHAT IS A BLOOD TRANSFUSION? Blood Transfusion Information  A transfusion is the replacement of blood or some of its parts. Blood is made up of multiple cells which provide different functions.  Red blood cells carry oxygen and are used for  blood loss replacement.  White blood cells fight against infection.  Platelets control bleeding.  Plasma helps clot blood.  Other blood products are available for specialized needs, such as hemophilia or other clotting disorders. BEFORE THE TRANSFUSION  Who gives blood for transfusions?   Healthy volunteers who are fully evaluated to make sure their blood is safe. This is blood bank blood. Transfusion therapy is the safest it has ever been in the practice of medicine. Before blood is taken from a donor, a complete history is taken to make sure that person has no history of diseases nor engages in risky social behavior (examples are intravenous drug use or sexual activity with multiple partners). The donor's travel history is screened to minimize risk of transmitting infections, such as malaria. The donated blood is tested for signs of infectious diseases, such as HIV and hepatitis. The blood is then tested to be sure it is compatible with you in order to minimize the chance of a transfusion reaction. If you or a relative donates blood, this is often done in anticipation of surgery and is not appropriate for emergency situations. It takes many days to process the donated blood. RISKS AND COMPLICATIONS Although transfusion therapy is very safe and saves many lives, the main dangers of transfusion include:  1. Getting an infectious disease. 2. Developing a transfusion reaction. This is an allergic reaction to something in the blood you were given. Every precaution is taken to prevent this. The decision to have a blood transfusion has been considered carefully by your caregiver before blood is given. Blood is not given unless the benefits outweigh the risks. AFTER THE TRANSFUSION  Right after receiving a blood transfusion,  you will usually feel much better and more energetic. This is especially true if your red blood cells have gotten low (anemic). The transfusion raises the level of the red blood  cells which carry oxygen, and this usually causes an energy increase.  The nurse administering the transfusion will monitor you carefully for complications. HOME CARE INSTRUCTIONS  No special instructions are needed after a transfusion. You may find your energy is better. Speak with your caregiver about any limitations on activity for underlying diseases you may have. SEEK MEDICAL CARE IF:   Your condition is not improving after your transfusion.  You develop redness or irritation at the intravenous (IV) site. SEEK IMMEDIATE MEDICAL CARE IF:  Any of the following symptoms occur over the next 12 hours:  Shaking chills.  You have a temperature by mouth above 102 F (38.9 C), not controlled by medicine.  Chest, back, or muscle pain.  People around you feel you are not acting correctly or are confused.  Shortness of breath or difficulty breathing.  Dizziness and fainting.  You get a rash or develop hives.  You have a decrease in urine output.  Your urine turns a dark color or changes to pink, red, or brown. Any of the following symptoms occur over the next 10 days:  You have a temperature by mouth above 102 F (38.9 C), not controlled by medicine.  Shortness of breath.  Weakness after normal activity.  The white part of the eye turns yellow (jaundice).  You have a decrease in the amount of urine or are urinating less often.  Your urine turns a dark color or changes to pink, red, or brown. Document Released: 06/08/2000 Document Revised: 09/03/2011 Document Reviewed: 01/26/2008 ExitCare Patient Information 2014 [place].  _______________________________________________________________________  Incentive Spirometer  An incentive spirometer is a tool that can help keep your lungs clear and active. This tool measures how well you are filling your lungs with each breath. Taking long deep breaths may help reverse or decrease the chance of developing breathing  (pulmonary) problems (especially infection) following:  A long period of time when you are unable to move or be active. BEFORE THE PROCEDURE   If the spirometer includes an indicator to show your best effort, your nurse or respiratory therapist will set it to a desired goal.  If possible, sit up straight or lean slightly forward. Try not to slouch.  Hold the incentive spirometer in an upright position. INSTRUCTIONS FOR USE  3. Sit on the edge of your bed if possible, or sit up as far as you can in bed or on a chair. 4. Hold the incentive spirometer in an upright position. 5. Breathe out normally. 6. Place the mouthpiece in your mouth and seal your lips tightly around it. 7. Breathe in slowly and as deeply as possible, raising the piston or the ball toward the top of the column. 8. Hold your breath for 3-5 seconds or for as long as possible. Allow the piston or ball to fall to the bottom of the column. 9. Remove the mouthpiece from your mouth and breathe out normally. 10. Rest for a few seconds and repeat Steps 1 through 7 at least 10 times every 1-2 hours when you are awake. Take your time and take a few normal breaths between deep breaths. 11. The spirometer may include an indicator to show your best effort. Use the indicator as a goal to work toward during each repetition. 12. After each set of 10 deep breaths,  practice coughing to be sure your lungs are clear. If you have an incision (the cut made at the time of surgery), support your incision when coughing by placing a pillow or rolled up towels firmly against it. Once you are able to get out of bed, walk around indoors and cough well. You may stop using the incentive spirometer when instructed by your caregiver.  RISKS AND COMPLICATIONS  Take your time so you do not get dizzy or light-headed.  If you are in pain, you may need to take or ask for pain medication before doing incentive spirometry. It is harder to take a deep breath if you  are having pain. AFTER USE  Rest and breathe slowly and easily.  It can be helpful to keep track of a log of your progress. Your caregiver can provide you with a simple table to help with this. If you are using the spirometer at home, follow these instructions: [place] IF:   You are having difficultly using the spirometer.  You have trouble using the spirometer as often as instructed.  Your pain medication is not giving enough relief while using the spirometer.  You develop fever of 100.5 F (38.1 C) or higher. SEEK IMMEDIATE MEDICAL CARE IF:   You cough up bloody sputum that had not been present before.  You develop fever of 102 F (38.9 C) or greater.  You develop worsening pain at or near the incision site. MAKE SURE YOU:   Understand these instructions.  Will watch your condition.  Will get help right away if you are not doing well or get worse. Document Released: 10/22/2006 Document Revised: 09/03/2011 Document Reviewed: 12/23/2006 [facility] Patient Information 2014 [place], [place].   ________________________________________________________________________

## 2016-01-06 NOTE — Progress Notes (Signed)
10/14/15- Clearance on chart 12/23/15- Stress Test- epic Ekg- 12/13/15- EPIC  08/01/15- CXR- EPIC , CT Chest- 08/2015- EPIC  12/13/15- Preop clearance- PCP- EPIC

## 2016-01-10 ENCOUNTER — Other Ambulatory Visit (HOSPITAL_COMMUNITY): Payer: Medicare Other

## 2016-01-15 ENCOUNTER — Ambulatory Visit: Payer: Self-pay | Admitting: Orthopedic Surgery

## 2016-01-15 NOTE — H&P (Signed)
David Buck DOB: [birth year] Married / Language: English / Race: White Male Date of Admission: 01/16/2016 CC:  Right knee pain History of Present Illness The patient is a 75 year old male who comes in for a preoperative History and Physical. The patient is scheduled for a right total knee arthroplasty to be performed by Dr. Dione Plover. [name], MD at [facility] on 01/16/2016. The patient is a 75 year old male who presented for follow up of their knee. The patient is being followed for their right knee pain and osteoarthritis. They are now months out from cortisone injection. Symptoms reported include: pain and swelling (mild). The patient feels that they are doing poorly. The following medication has been used for pain control: antiinflammatory medication (Naporoxen, occasionally). The patient has not gotten any relief of their symptoms with Cortisone injections (may have helped slightly after a few weeks, but now the knee feels the same as it did prior to injection). Unfortunately, the injections did not provide any benefit. He had one or two days where he felt a little better, but the knee has gotten progressively worse again. He has had multiple viscosupplement series without benefit. The knee is now bothering him with all activities. It tends to feel somewhat better at rest, but it is not fully better. He feels as though the knee is preventing him from doing what he desires. He would like to be more active, but cannot do so because of the knee. He is ready to get the knee fixed. They have been treated conservatively in the past for the above stated problem and despite conservative measures, they continue to have progressive pain and severe functional limitations and dysfunction. They have failed non-operative management including home exercise, medications, and injections. It is felt that they would benefit from undergoing total joint replacement. Risks and benefits of the procedure have been  discussed with the patient and they elect to proceed with surgery. There are no active contraindications to surgery such as ongoing infection or rapidly progressive neurological disease.  Problem List/Past Medical Hepatitis B  High blood pressure  Hypothyroidism  Osteoarthritis  Prostate Disease  Primary osteoarthritis of right knee (M17.11)  Tinnitus  Dentures  Pneumonia  Feb 2017 Measles  Mumps  Allergies Cefdinir *CEPHALOSPORINS*  Rash.  Family History  Cancer  Father, Maternal Grandmother, Mother, Sister. mother, father, sister, grandmother mothers side and grandfather mothers side Cerebrovascular Accident  Father, Paternal Grandfather. father Diabetes Mellitus  Paternal Grandfather. grandfather fathers side First Degree Relatives  reported Heart Disease  grandfather fathers side Hypertension  Father, Mother. father Osteoarthritis  Father, Sister. father and sister Severe allergy  Father.  Social History Alcohol use  current drinker; drinks beer, wine and hard liquor; only occasionally per week Children  2 Current drinker  07/28/2015: Currently drinks beer, wine and hard liquor less than 5 times per week Current work status  retired Engineer, agricultural (Currently)  no Drug/Alcohol Rehab (Previously)  no Exercise  Exercises rarely; does running / walking Illicit drug use  no Living situation  live with spouse Marital status  married No history of drug/alcohol rehab  Not under pain contract  Number of flights of stairs before winded  2-3 Pain Contract  no Tobacco use  Current every day smoker. current every day smoker; smoke(d) 1 pack(s) per day Post-Surgical Plans  Home Advance Directives  Living Will, Healthcare POA  Medication History Levothyroxine Sodium (75MCG Tablet, Oral) Active. HydroCHLOROthiazide (12.'5MG'$  Capsule, Oral) Active. Aspirin ('81MG'$  Tablet, 1 (  one) Oral) Active. Multiple Vitamin (1 (one) Oral)  Active. Vitamin C (1 (one) Oral) Specific strength unknown - Active. Vitamin D (Oral) Specific strength unknown - Active. Glucosamine Sulfate ('1000MG'$  Capsule, Oral) Active. Saw Palmetto ('160MG'$  Tablet, Oral) Active. Loratadine ('10MG'$  Tablet, Oral) Active.  Past Surgical History Arthroscopy of Knee  right Cataract Surgery  bilateral Colon Polyp Removal - Colonoscopy  Colon Polyp Removal - Open  Inguinal Hernia Repair  open: bilateral   Review of Systems General Not Present- Chills, Fatigue, Fever, Memory Loss, Night Sweats, Weight Gain and Weight Loss. Skin Not Present- Eczema, Hives, Itching, Lesions and Rash. HEENT Present- Tinnitus. Not Present- Dentures, Double Vision, Headache, Hearing Loss and Visual Loss. Respiratory Not Present- Allergies, Chronic Cough, Coughing up blood, Shortness of breath at rest and Shortness of breath with exertion. Cardiovascular Not Present- Chest Pain, Difficulty Breathing Lying Down, Murmur, Palpitations, Racing/skipping heartbeats and Swelling. Gastrointestinal Not Present- Abdominal Pain, Bloody Stool, Constipation, Diarrhea, Difficulty Swallowing, Heartburn, Jaundice, Loss of appetitie, Nausea and Vomiting. Male Genitourinary Present- Weak urinary stream. Not Present- Blood in Urine, Discharge, Flank Pain, Incontinence, Painful Urination, Urgency, Urinary frequency, Urinary Retention and Urinating at Night. Musculoskeletal Present- Joint Pain and Joint Swelling. Not Present- Back Pain, Morning Stiffness, Muscle Pain, Muscle Weakness and Spasms. Neurological Not Present- Blackout spells, Difficulty with balance, Dizziness, Paralysis, Tremor and Weakness. Psychiatric Not Present- Insomnia.  Vitals  Weight: 173 lb Height: 74in Weight was reported by patient. Height was reported by patient. Body Surface Area: 2.04 m Body Mass Index: 22.21 kg/m  Pulse: 76 (Regular)  BP: 130/76 (Sitting, Right Arm, Standard)  Physical  Exam General Mental Status -Alert, cooperative and good historian. General Appearance-pleasant, Not in acute distress. Orientation-Oriented X3. Build & Nutrition-Well nourished and Well developed.  Head and Neck Head-normocephalic, atraumatic . Neck Global Assessment - supple, no bruit auscultated on the right, no bruit auscultated on the left.  Eye Vision-Wears corrective lenses(readers only). Pupil - Bilateral-Regular and Round. Motion - Bilateral-EOMI.  ENMT Note: upper denture plate   Chest and Lung Exam Auscultation Breath sounds - clear at anterior chest wall and clear at posterior chest wall. Adventitious sounds - No Adventitious sounds.  Cardiovascular Auscultation Rhythm - Regular rate and rhythm. Heart Sounds - S1 WNL and S2 WNL. Murmurs & Other Heart Sounds - Auscultation of the heart reveals - No Murmurs.  Abdomen Palpation/Percussion Tenderness - Abdomen is non-tender to palpation. Rigidity (guarding) - Abdomen is soft. Auscultation Auscultation of the abdomen reveals - Bowel sounds normal.  Male Genitourinary Note: Not done, not pertinent to present illness   Musculoskeletal Note: On exam, he is alert and oriented, in no apparent distress. Evaluation of his hips show normal range of motion, no discomfort. His left knee shows no effusion. Range is 0 to 125. No tenderness or instability. Right knee, no effusion. His range is about 5 to 125 or 130. He is very tender medially. There is no lateral tenderness. He does have a fair amount of laxity with valgus stressing. He actually does have some tenderness over the lateral collateral. He also has instability with varus testing.  RADIOGRAPHS Radiographs were reviewed and he has bone-on-bone arthritis in the medial compartment. He has some spurring out laterally and patellofemoral spurring, but not bone-on-bone.  Assessment & Plan Primary osteoarthritis of right knee (M17.11)  Note:Surgical  Plans: Right Total Knee Replacement  Disposition: Home  PCP: Dr. Loni Muse [name] - Patient has been seen preoperatively and felt to be stable for surgery.  IV TXA  Anesthesia Issues: None  VERITAS STUDY PATIENT Traditional Therapy - HHPT  Signed electronically by Joelene Millin, III PA-C

## 2016-01-16 ENCOUNTER — Inpatient Hospital Stay (HOSPITAL_COMMUNITY): Payer: Medicare Other | Admitting: Anesthesiology

## 2016-01-16 ENCOUNTER — Inpatient Hospital Stay (HOSPITAL_COMMUNITY)
Admission: RE | Admit: 2016-01-16 | Discharge: 2016-01-18 | DRG: 470 | Disposition: A | Discharge status: Home / Self Care | Payer: Medicare Other | Source: Ambulatory Visit | Attending: Orthopedic Surgery | Admitting: Orthopedic Surgery

## 2016-01-16 ENCOUNTER — Encounter (HOSPITAL_COMMUNITY): Payer: Self-pay | Admitting: *Deleted

## 2016-01-16 ENCOUNTER — Encounter (HOSPITAL_COMMUNITY)
Admission: RE | Disposition: A | Discharge status: Home / Self Care | Payer: Self-pay | Source: Ambulatory Visit | Attending: Orthopedic Surgery

## 2016-01-16 DIAGNOSIS — M1711 Unilateral primary osteoarthritis, right knee: Principal | ICD-10-CM | POA: Diagnosis present

## 2016-01-16 DIAGNOSIS — E039 Hypothyroidism, unspecified: Secondary | ICD-10-CM | POA: Diagnosis present

## 2016-01-16 DIAGNOSIS — M179 Osteoarthritis of knee, unspecified: Secondary | ICD-10-CM | POA: Diagnosis not present

## 2016-01-16 DIAGNOSIS — Z79899 Other long term (current) drug therapy: Secondary | ICD-10-CM

## 2016-01-16 DIAGNOSIS — Z7982 Long term (current) use of aspirin: Secondary | ICD-10-CM

## 2016-01-16 DIAGNOSIS — I10 Essential (primary) hypertension: Secondary | ICD-10-CM | POA: Diagnosis present

## 2016-01-16 DIAGNOSIS — M25561 Pain in right knee: Secondary | ICD-10-CM | POA: Diagnosis not present

## 2016-01-16 DIAGNOSIS — J449 Chronic obstructive pulmonary disease, unspecified: Secondary | ICD-10-CM | POA: Diagnosis present

## 2016-01-16 DIAGNOSIS — I739 Peripheral vascular disease, unspecified: Secondary | ICD-10-CM | POA: Diagnosis not present

## 2016-01-16 DIAGNOSIS — M1710 Unilateral primary osteoarthritis, unspecified knee: Secondary | ICD-10-CM | POA: Diagnosis present

## 2016-01-16 DIAGNOSIS — F17210 Nicotine dependence, cigarettes, uncomplicated: Secondary | ICD-10-CM | POA: Diagnosis not present

## 2016-01-16 HISTORY — PX: TOTAL KNEE ARTHROPLASTY: SHX125

## 2016-01-16 LAB — TYPE AND SCREEN
ABO/RH(D): A POS
ANTIBODY SCREEN: NEGATIVE

## 2016-01-16 SURGERY — ARTHROPLASTY, KNEE, TOTAL
Anesthesia: Monitor Anesthesia Care | Site: Knee | Laterality: Right

## 2016-01-16 MED ORDER — POLYETHYLENE GLYCOL 3350 17 G PO PACK: 17.0000 g | PACK | Freq: Every day | ORAL | Status: DC | PRN

## 2016-01-16 MED ORDER — CHLORHEXIDINE GLUCONATE 4 % EX LIQD: 60.0000 mL | Freq: Once | CUTANEOUS | Status: DC

## 2016-01-16 MED ORDER — RIVAROXABAN 10 MG PO TABS
10.0000 mg | ORAL_TABLET | Freq: Every day | ORAL | Status: DC
  Administered 2016-01-17 – 2016-01-18 (×2): 10 mg via ORAL
  Filled 2016-01-16 (×2): qty 1

## 2016-01-16 MED ORDER — LACTATED RINGERS IV SOLN
INTRAVENOUS | Status: DC
  Administered 2016-01-16 (×3): via INTRAVENOUS

## 2016-01-16 MED ORDER — ACETAMINOPHEN 10 MG/ML IV SOLN
1000.0000 mg | Freq: Once | INTRAVENOUS | Status: AC
  Administered 2016-01-16: 1000 mg via INTRAVENOUS
  Filled 2016-01-16: qty 100

## 2016-01-16 MED ORDER — OXYCODONE HCL 5 MG PO TABS: 5.0000 mg | ORAL_TABLET | Freq: Once | ORAL | Status: DC | PRN

## 2016-01-16 MED ORDER — ONDANSETRON HCL 4 MG/2ML IJ SOLN: 4.0000 mg | Freq: Four times a day (QID) | INTRAMUSCULAR | Status: DC | PRN

## 2016-01-16 MED ORDER — PHENOL 1.4 % MT LIQD: 1.0000 | OROMUCOSAL | Status: DC | PRN

## 2016-01-16 MED ORDER — BISACODYL 10 MG RE SUPP: 10.0000 mg | Freq: Every day | RECTAL | Status: DC | PRN

## 2016-01-16 MED ORDER — FLEET ENEMA 7-19 GM/118ML RE ENEM: 1.0000 | ENEMA | Freq: Once | RECTAL | Status: DC | PRN

## 2016-01-16 MED ORDER — ONDANSETRON HCL 4 MG/2ML IJ SOLN
4.0000 mg | Freq: Four times a day (QID) | INTRAMUSCULAR | Status: DC | PRN
Start: 2016-01-16 — End: 2016-01-18

## 2016-01-16 MED ORDER — HYDROCHLOROTHIAZIDE 12.5 MG PO CAPS
12.5000 mg | ORAL_CAPSULE | Freq: Every day | ORAL | Status: DC
  Filled 2016-01-16: qty 1

## 2016-01-16 MED ORDER — EPHEDRINE SULFATE 50 MG/ML IJ SOLN
INTRAMUSCULAR | Status: DC | PRN
  Administered 2016-01-16 (×2): 10 mg via INTRAVENOUS

## 2016-01-16 MED ORDER — FENTANYL CITRATE (PF) 100 MCG/2ML IJ SOLN: 25.0000 ug | INTRAMUSCULAR | Status: DC | PRN

## 2016-01-16 MED ORDER — SODIUM CHLORIDE 0.9 % IJ SOLN
INTRAMUSCULAR | Status: AC
  Filled 2016-01-16: qty 10

## 2016-01-16 MED ORDER — METHOCARBAMOL 500 MG PO TABS
500.0000 mg | ORAL_TABLET | Freq: Four times a day (QID) | ORAL | Status: DC | PRN
  Administered 2016-01-16 – 2016-01-17 (×3): 500 mg via ORAL
  Filled 2016-01-16 (×3): qty 1

## 2016-01-16 MED ORDER — BUPIVACAINE LIPOSOME 1.3 % IJ SUSP
INTRAMUSCULAR | Status: DC | PRN
  Administered 2016-01-16: 20 mL

## 2016-01-16 MED ORDER — VANCOMYCIN HCL IN DEXTROSE 1-5 GM/200ML-% IV SOLN
1000.0000 mg | INTRAVENOUS | Status: AC
  Administered 2016-01-16: 1000 mg via INTRAVENOUS
  Filled 2016-01-16: qty 200

## 2016-01-16 MED ORDER — LIDOCAINE HCL (CARDIAC) 20 MG/ML IV SOLN
INTRAVENOUS | Status: AC
  Filled 2016-01-16: qty 5

## 2016-01-16 MED ORDER — DEXAMETHASONE SODIUM PHOSPHATE 10 MG/ML IJ SOLN
10.0000 mg | Freq: Once | INTRAMUSCULAR | Status: AC
  Administered 2016-01-17: 10 mg via INTRAVENOUS
  Filled 2016-01-16: qty 1

## 2016-01-16 MED ORDER — BUPIVACAINE IN DEXTROSE 0.75-8.25 % IT SOLN
INTRATHECAL | Status: DC | PRN
  Administered 2016-01-16: 2 mL via INTRATHECAL

## 2016-01-16 MED ORDER — OXYCODONE HCL 5 MG/5ML PO SOLN
5.0000 mg | Freq: Once | ORAL | Status: DC | PRN
  Filled 2016-01-16: qty 5

## 2016-01-16 MED ORDER — ACETAMINOPHEN 10 MG/ML IV SOLN
INTRAVENOUS | Status: AC
  Filled 2016-01-16: qty 100

## 2016-01-16 MED ORDER — ONDANSETRON HCL 4 MG PO TABS: 4.0000 mg | ORAL_TABLET | Freq: Four times a day (QID) | ORAL | Status: DC | PRN

## 2016-01-16 MED ORDER — LEVOTHYROXINE SODIUM 75 MCG PO TABS
75.0000 ug | ORAL_TABLET | Freq: Every day | ORAL | Status: DC
  Administered 2016-01-17 – 2016-01-18 (×2): 75 ug via ORAL
  Filled 2016-01-16 (×2): qty 1

## 2016-01-16 MED ORDER — MENTHOL 3 MG MT LOZG: 1.0000 | LOZENGE | OROMUCOSAL | Status: DC | PRN

## 2016-01-16 MED ORDER — METOCLOPRAMIDE HCL 5 MG/ML IJ SOLN: 5.0000 mg | Freq: Three times a day (TID) | INTRAMUSCULAR | Status: DC | PRN

## 2016-01-16 MED ORDER — SODIUM CHLORIDE 0.9 % IJ SOLN
INTRAMUSCULAR | Status: AC
  Filled 2016-01-16: qty 50

## 2016-01-16 MED ORDER — LORATADINE 10 MG PO TABS
10.0000 mg | ORAL_TABLET | Freq: Every day | ORAL | Status: DC
  Administered 2016-01-17 – 2016-01-18 (×2): 10 mg via ORAL
  Filled 2016-01-16 (×2): qty 1

## 2016-01-16 MED ORDER — SODIUM CHLORIDE 0.9 % IV SOLN
INTRAVENOUS | Status: DC
  Administered 2016-01-16 – 2016-01-17 (×2): via INTRAVENOUS

## 2016-01-16 MED ORDER — TRANEXAMIC ACID 1000 MG/10ML IV SOLN
1000.0000 mg | Freq: Once | INTRAVENOUS | Status: AC
  Administered 2016-01-16: 1000 mg via INTRAVENOUS
  Filled 2016-01-16: qty 10

## 2016-01-16 MED ORDER — ONDANSETRON HCL 4 MG/2ML IJ SOLN
INTRAMUSCULAR | Status: AC
  Filled 2016-01-16: qty 2

## 2016-01-16 MED ORDER — EPHEDRINE SULFATE 50 MG/ML IJ SOLN
INTRAMUSCULAR | Status: AC
  Filled 2016-01-16: qty 1

## 2016-01-16 MED ORDER — SODIUM CHLORIDE 0.9 % IJ SOLN
INTRAMUSCULAR | Status: DC | PRN
  Administered 2016-01-16: 30 mL

## 2016-01-16 MED ORDER — METHOCARBAMOL 1000 MG/10ML IJ SOLN
500.0000 mg | Freq: Four times a day (QID) | INTRAMUSCULAR | Status: DC | PRN
  Filled 2016-01-16: qty 5

## 2016-01-16 MED ORDER — ACETAMINOPHEN 650 MG RE SUPP
650.0000 mg | Freq: Four times a day (QID) | RECTAL | Status: DC | PRN
Start: 2016-01-17 — End: 2016-01-18

## 2016-01-16 MED ORDER — NICOTINE 14 MG/24HR TD PT24
14.0000 mg | MEDICATED_PATCH | Freq: Every day | TRANSDERMAL | Status: DC
  Administered 2016-01-16 – 2016-01-18 (×3): 14 mg via TRANSDERMAL
  Filled 2016-01-16 (×3): qty 1

## 2016-01-16 MED ORDER — DEXAMETHASONE SODIUM PHOSPHATE 10 MG/ML IJ SOLN
INTRAMUSCULAR | Status: AC
  Filled 2016-01-16: qty 1

## 2016-01-16 MED ORDER — LIDOCAINE HCL (CARDIAC) 20 MG/ML IV SOLN
INTRAVENOUS | Status: DC | PRN
  Administered 2016-01-16: 50 mg via INTRATRACHEAL

## 2016-01-16 MED ORDER — DEXAMETHASONE SODIUM PHOSPHATE 10 MG/ML IJ SOLN
10.0000 mg | Freq: Once | INTRAMUSCULAR | Status: AC
  Administered 2016-01-16: 10 mg via INTRAVENOUS

## 2016-01-16 MED ORDER — VANCOMYCIN HCL IN DEXTROSE 1-5 GM/200ML-% IV SOLN
1000.0000 mg | Freq: Two times a day (BID) | INTRAVENOUS | Status: AC
  Administered 2016-01-16: 1000 mg via INTRAVENOUS
  Filled 2016-01-16 (×2): qty 200

## 2016-01-16 MED ORDER — ACETAMINOPHEN 325 MG PO TABS
650.0000 mg | ORAL_TABLET | Freq: Four times a day (QID) | ORAL | Status: DC | PRN
  Administered 2016-01-17: 650 mg via ORAL
  Filled 2016-01-16: qty 2

## 2016-01-16 MED ORDER — SODIUM CHLORIDE 0.9 % IV SOLN
1000.0000 mg | INTRAVENOUS | Status: AC
  Administered 2016-01-16: 1000 mg via INTRAVENOUS
  Filled 2016-01-16: qty 10

## 2016-01-16 MED ORDER — PROPOFOL 10 MG/ML IV BOLUS
INTRAVENOUS | Status: AC
  Filled 2016-01-16: qty 60

## 2016-01-16 MED ORDER — PROPOFOL 500 MG/50ML IV EMUL
INTRAVENOUS | Status: DC | PRN
  Administered 2016-01-16: 75 ug/kg/min via INTRAVENOUS

## 2016-01-16 MED ORDER — BUPIVACAINE HCL 0.25 % IJ SOLN
INTRAMUSCULAR | Status: DC | PRN
  Administered 2016-01-16: 20 mL

## 2016-01-16 MED ORDER — DIPHENHYDRAMINE HCL 12.5 MG/5ML PO ELIX: 12.5000 mg | ORAL_SOLUTION | ORAL | Status: DC | PRN

## 2016-01-16 MED ORDER — BUPIVACAINE LIPOSOME 1.3 % IJ SUSP
20.0000 mL | Freq: Once | INTRAMUSCULAR | Status: DC
  Filled 2016-01-16: qty 20

## 2016-01-16 MED ORDER — OXYCODONE HCL 5 MG PO TABS
5.0000 mg | ORAL_TABLET | ORAL | Status: DC | PRN
  Administered 2016-01-16 – 2016-01-18 (×7): 10 mg via ORAL
  Filled 2016-01-16 (×7): qty 2

## 2016-01-16 MED ORDER — PROPOFOL 10 MG/ML IV BOLUS
INTRAVENOUS | Status: DC | PRN
  Administered 2016-01-16: 20 mg via INTRAVENOUS
  Administered 2016-01-16: 30 mg via INTRAVENOUS

## 2016-01-16 MED ORDER — TRAMADOL HCL 50 MG PO TABS: 50.0000 mg | ORAL_TABLET | Freq: Four times a day (QID) | ORAL | Status: DC | PRN

## 2016-01-16 MED ORDER — ONDANSETRON HCL 4 MG/2ML IJ SOLN
INTRAMUSCULAR | Status: DC | PRN
  Administered 2016-01-16: 4 mg via INTRAVENOUS

## 2016-01-16 MED ORDER — MORPHINE SULFATE (PF) 2 MG/ML IV SOLN: 1.0000 mg | INTRAVENOUS | Status: DC | PRN

## 2016-01-16 MED ORDER — ACETAMINOPHEN 500 MG PO TABS
1000.0000 mg | ORAL_TABLET | Freq: Four times a day (QID) | ORAL | Status: AC
  Administered 2016-01-16 – 2016-01-17 (×4): 1000 mg via ORAL
  Filled 2016-01-16 (×4): qty 2

## 2016-01-16 MED ORDER — BUPIVACAINE HCL (PF) 0.25 % IJ SOLN
INTRAMUSCULAR | Status: AC
  Filled 2016-01-16: qty 30

## 2016-01-16 MED ORDER — DOCUSATE SODIUM 100 MG PO CAPS
100.0000 mg | ORAL_CAPSULE | Freq: Two times a day (BID) | ORAL | Status: DC
  Administered 2016-01-16 – 2016-01-18 (×4): 100 mg via ORAL
  Filled 2016-01-16 (×4): qty 1

## 2016-01-16 MED ORDER — METOCLOPRAMIDE HCL 5 MG PO TABS: 5.0000 mg | ORAL_TABLET | Freq: Three times a day (TID) | ORAL | Status: DC | PRN

## 2016-01-16 SURGICAL SUPPLY — 49 items
BAG DECANTER FOR FLEXI CONT (MISCELLANEOUS) IMPLANT
BAG ZIPLOCK 12X15 (MISCELLANEOUS) ×2 IMPLANT
BANDAGE ACE 6X5 VEL STRL LF (GAUZE/BANDAGES/DRESSINGS) ×2 IMPLANT
BLADE SAG 18X100X1.27 (BLADE) ×2 IMPLANT
BLADE SAW SGTL 11.0X1.19X90.0M (BLADE) ×2 IMPLANT
BOWL SMART MIX CTS (DISPOSABLE) ×2 IMPLANT
CAPT KNEE TOTAL 3 ATTUNE ×2 IMPLANT
CEMENT HV SMART SET (Cement) ×4 IMPLANT
CLOTH BEACON ORANGE TIMEOUT ST (SAFETY) ×2 IMPLANT
CUFF TOURN SGL QUICK 34 (TOURNIQUET CUFF) ×1
CUFF TRNQT CYL 34X4X40X1 (TOURNIQUET CUFF) ×1 IMPLANT
DECANTER SPIKE VIAL GLASS SM (MISCELLANEOUS) ×2 IMPLANT
DRAPE U-SHAPE 47X51 STRL (DRAPES) ×2 IMPLANT
DRSG ADAPTIC 3X8 NADH LF (GAUZE/BANDAGES/DRESSINGS) ×2 IMPLANT
DRSG PAD ABDOMINAL 8X10 ST (GAUZE/BANDAGES/DRESSINGS) ×2 IMPLANT
DURAPREP 26ML APPLICATOR (WOUND CARE) ×2 IMPLANT
ELECT REM PT RETURN 9FT ADLT (ELECTROSURGICAL) ×2
ELECTRODE REM PT RTRN 9FT ADLT (ELECTROSURGICAL) ×1 IMPLANT
EVACUATOR 1/8 PVC DRAIN (DRAIN) ×2 IMPLANT
GAUZE SPONGE 4X4 12PLY STRL (GAUZE/BANDAGES/DRESSINGS) ×2 IMPLANT
GLOVE BIO SURGEON STRL SZ7.5 (GLOVE) ×2 IMPLANT
GLOVE BIO SURGEON STRL SZ8 (GLOVE) ×2 IMPLANT
GLOVE BIOGEL PI IND STRL 6.5 (GLOVE) IMPLANT
GLOVE BIOGEL PI IND STRL 8 (GLOVE) ×2 IMPLANT
GLOVE BIOGEL PI INDICATOR 6.5 (GLOVE)
GLOVE BIOGEL PI INDICATOR 8 (GLOVE) ×2
GLOVE SURG SS PI 6.5 STRL IVOR (GLOVE) IMPLANT
GOWN STRL REUS W/TWL LRG LVL3 (GOWN DISPOSABLE) ×2 IMPLANT
GOWN STRL REUS W/TWL XL LVL3 (GOWN DISPOSABLE) ×2 IMPLANT
HANDPIECE INTERPULSE COAX TIP (DISPOSABLE) ×1
IMMOBILIZER KNEE 20 (SOFTGOODS) ×2
IMMOBILIZER KNEE 20 THIGH 36 (SOFTGOODS) ×1 IMPLANT
MANIFOLD NEPTUNE II (INSTRUMENTS) ×2 IMPLANT
NS IRRIG 1000ML POUR BTL (IV SOLUTION) ×2 IMPLANT
PACK TOTAL KNEE CUSTOM (KITS) ×2 IMPLANT
PADDING CAST COTTON 6X4 STRL (CAST SUPPLIES) ×4 IMPLANT
POSITIONER SURGICAL ARM (MISCELLANEOUS) ×2 IMPLANT
SET HNDPC FAN SPRY TIP SCT (DISPOSABLE) ×1 IMPLANT
STRIP CLOSURE SKIN 1/2X4 (GAUZE/BANDAGES/DRESSINGS) ×4 IMPLANT
SUT MNCRL AB 4-0 PS2 18 (SUTURE) ×2 IMPLANT
SUT VIC AB 2-0 CT1 27 (SUTURE) ×3
SUT VIC AB 2-0 CT1 TAPERPNT 27 (SUTURE) ×3 IMPLANT
SUT VLOC 180 0 24IN GS25 (SUTURE) ×2 IMPLANT
SYR 50ML LL SCALE MARK (SYRINGE) IMPLANT
TRAY FOLEY W/METER SILVER 14FR (SET/KITS/TRAYS/PACK) IMPLANT
TRAY FOLEY W/METER SILVER 16FR (SET/KITS/TRAYS/PACK) ×2 IMPLANT
WATER STERILE IRR 1500ML POUR (IV SOLUTION) ×2 IMPLANT
WRAP KNEE MAXI GEL POST OP (GAUZE/BANDAGES/DRESSINGS) ×2 IMPLANT
YANKAUER SUCT BULB TIP 10FT TU (MISCELLANEOUS) ×2 IMPLANT

## 2016-01-16 NOTE — Anesthesia Procedure Notes (Signed)
Spinal  Patient location during procedure: OR Start time: 01/16/2016 12:15 PM End time: 01/16/2016 12:19 PM Staffing Anesthesiologist: Marcie Bal, Korianna Washer Performed: anesthesiologist  Preanesthetic Checklist Completed: patient identified, site marked, surgical consent, pre-op evaluation, timeout performed, IV checked, risks and benefits discussed and monitors and equipment checked Spinal Block Patient position: sitting Prep: Betadine and site prepped and draped Patient monitoring: heart rate, cardiac monitor, continuous pulse ox and blood pressure Approach: midline Location: L3-4 Injection technique: single-shot Needle Needle type: Pencan  Needle gauge: 24 G Needle length: 10 cm Assessment Sensory level: T8 Additional Notes Pt tolerated the procedure well.

## 2016-01-16 NOTE — Transfer of Care (Signed)
Immediate Anesthesia Transfer of Care Note  Patient: David Buck  Procedure(s) Performed: Procedure(s): RIGHT TOTAL KNEE ARTHROPLASTY (Right)  Patient Location: PACU  Anesthesia Type:MAC and Spinal  Level of Consciousness: awake, alert  and patient cooperative  Airway & Oxygen Therapy: Patient Spontanous Breathing and Patient connected to face mask oxygen  Post-op Assessment: Report given to RN and Post -op Vital signs reviewed and stable  Post vital signs: Reviewed and stable  Last Vitals:  Vitals:   01/16/16 1002  BP: 133/74  Pulse: 75  Resp: 18  Temp: 36.6 C    Last Pain:  Vitals:   01/16/16 1002  TempSrc: Oral      Patients Stated Pain Goal: 4 (87/[id number]/43 [id number])  Complications: No apparent anesthesia complications

## 2016-01-16 NOTE — Anesthesia Postprocedure Evaluation (Signed)
Anesthesia Post Note  Patient: David Buck  Procedure(s) Performed: Procedure(s) (LRB): RIGHT TOTAL KNEE ARTHROPLASTY (Right)  Patient location during evaluation: PACU Anesthesia Type: Spinal Level of consciousness: oriented and awake and alert Pain management: pain level controlled Vital Signs Assessment: post-procedure vital signs reviewed and stable Respiratory status: spontaneous breathing, respiratory function stable and patient connected to nasal cannula oxygen Cardiovascular status: blood pressure returned to baseline and stable Postop Assessment: no headache and no backache Anesthetic complications: no    Last Vitals:  Vitals:   01/16/16 1757 01/16/16 1854  BP: 126/69 121/83  Pulse: 67 65  Resp:  18  Temp: 36.4 C 36.4 C    Last Pain:  Vitals:   01/16/16 1854  TempSrc: Oral                 Faruq Rosenberger S

## 2016-01-16 NOTE — Interval H&P Note (Signed)
History and Physical Interval Note:  01/16/2016 11:35 AM  David Buck  has presented today for surgery, with the diagnosis of RIGHT KNEE OSTEOARTHRITIS   The various methods of treatment have been discussed with the patient and family. After consideration of risks, benefits and other options for treatment, the patient has consented to  Procedure(s): RIGHT TOTAL KNEE ARTHROPLASTY (Right) as a surgical intervention .  The patient's history has been reviewed, patient examined, no change in status, stable for surgery.  I have reviewed the patient's chart and labs.  Questions were answered to the patient's satisfaction.     Gearlean Alf

## 2016-01-16 NOTE — Anesthesia Preprocedure Evaluation (Signed)
Anesthesia Evaluation  Patient identified by MRN, date of birth, ID band Patient awake    Reviewed: Allergy & Precautions, NPO status , Patient's Chart, lab work & pertinent test results  Airway Mallampati: II   Neck ROM: full    Dental   Pulmonary COPD, Current Smoker,    breath sounds clear to auscultation       Cardiovascular hypertension, + Peripheral Vascular Disease   Rhythm:regular Rate:Normal  LAFB   Neuro/Psych    GI/Hepatic   Endo/Other  Hypothyroidism   Renal/GU      Musculoskeletal  (+) Arthritis ,   Abdominal   Peds  Hematology   Anesthesia Other Findings   Reproductive/Obstetrics                             Anesthesia Physical Anesthesia Plan  ASA: III  Anesthesia Plan: MAC and Spinal   Post-op Pain Management:    Induction: Intravenous  Airway Management Planned: Simple Face Mask  Additional Equipment:   Intra-op Plan:   Post-operative Plan:   Informed Consent: I have reviewed the patients History and Physical, chart, labs and discussed the procedure including the risks, benefits and alternatives for the proposed anesthesia with the patient or authorized representative who has indicated his/her understanding and acceptance.     Plan Discussed with: CRNA, Anesthesiologist and Surgeon  Anesthesia Plan Comments:         Anesthesia Quick Evaluation

## 2016-01-16 NOTE — H&P (View-Only) (Signed)
David Buck DOB: Jul 08, 1940 Married / Language: English / Race: White Male Date of Admission: 01/16/2016 CC:  Right knee pain History of Present Illness The patient is a 75 year old male who comes in for a preoperative History and Physical. The patient is scheduled for a right total knee arthroplasty to be performed by Dr. Dione Plover. [name], MD at [facility] on 01/16/2016. The patient is a 75 year old male who presented for follow up of their knee. The patient is being followed for their right knee pain and osteoarthritis. They are now months out from cortisone injection. Symptoms reported include: pain and swelling (mild). The patient feels that they are doing poorly. The following medication has been used for pain control: antiinflammatory medication (Naporoxen, occasionally). The patient has not gotten any relief of their symptoms with Cortisone injections (may have helped slightly after a few weeks, but now the knee feels the same as it did prior to injection). Unfortunately, the injections did not provide any benefit. He had one or two days where he felt a little better, but the knee has gotten progressively worse again. He has had multiple viscosupplement series without benefit. The knee is now bothering him with all activities. It tends to feel somewhat better at rest, but it is not fully better. He feels as though the knee is preventing him from doing what he desires. He would like to be more active, but cannot do so because of the knee. He is ready to get the knee fixed. They have been treated conservatively in the past for the above stated problem and despite conservative measures, they continue to have progressive pain and severe functional limitations and dysfunction. They have failed non-operative management including home exercise, medications, and injections. It is felt that they would benefit from undergoing total joint replacement. Risks and benefits of the procedure have been  discussed with the patient and they elect to proceed with surgery. There are no active contraindications to surgery such as ongoing infection or rapidly progressive neurological disease.  Problem List/Past Medical Hepatitis B  High blood pressure  Hypothyroidism  Osteoarthritis  Prostate Disease  Primary osteoarthritis of right knee (M17.11)  Tinnitus  Dentures  Pneumonia  Feb 2017 Measles  Mumps  Allergies Cefdinir *CEPHALOSPORINS*  Rash.  Family History  Cancer  Father, Maternal Grandmother, Mother, Sister. mother, father, sister, grandmother mothers side and grandfather mothers side Cerebrovascular Accident  Father, Paternal Grandfather. father Diabetes Mellitus  Paternal Grandfather. grandfather fathers side First Degree Relatives  reported Heart Disease  grandfather fathers side Hypertension  Father, Mother. father Osteoarthritis  Father, Sister. father and sister Severe allergy  Father.  Social History Alcohol use  current drinker; drinks beer, wine and hard liquor; only occasionally per week Children  2 Current drinker  07/28/2015: Currently drinks beer, wine and hard liquor less than 5 times per week Current work status  retired Engineer, agricultural (Currently)  no Drug/Alcohol Rehab (Previously)  no Exercise  Exercises rarely; does running / walking Illicit drug use  no Living situation  live with spouse Marital status  married No history of drug/alcohol rehab  Not under pain contract  Number of flights of stairs before winded  2-3 Pain Contract  no Tobacco use  Current every day smoker. current every day smoker; smoke(d) 1 pack(s) per day Post-Surgical Plans  Home Advance Directives  Living Will, Healthcare POA  Medication History Levothyroxine Sodium (75MCG Tablet, Oral) Active. HydroCHLOROthiazide (12.'5MG'$  Capsule, Oral) Active. Aspirin ('81MG'$  Tablet, 1 (  one) Oral) Active. Multiple Vitamin (1 (one) Oral)  Active. Vitamin C (1 (one) Oral) Specific strength unknown - Active. Vitamin D (Oral) Specific strength unknown - Active. Glucosamine Sulfate ('1000MG'$  Capsule, Oral) Active. Saw Palmetto ('160MG'$  Tablet, Oral) Active. Loratadine ('10MG'$  Tablet, Oral) Active.  Past Surgical History Arthroscopy of Knee  right Cataract Surgery  bilateral Colon Polyp Removal - Colonoscopy  Colon Polyp Removal - Open  Inguinal Hernia Repair  open: bilateral   Review of Systems General Not Present- Chills, Fatigue, Fever, Memory Loss, Night Sweats, Weight Gain and Weight Loss. Skin Not Present- Eczema, Hives, Itching, Lesions and Rash. HEENT Present- Tinnitus. Not Present- Dentures, Double Vision, Headache, Hearing Loss and Visual Loss. Respiratory Not Present- Allergies, Chronic Cough, Coughing up blood, Shortness of breath at rest and Shortness of breath with exertion. Cardiovascular Not Present- Chest Pain, Difficulty Breathing Lying Down, Murmur, Palpitations, Racing/skipping heartbeats and Swelling. Gastrointestinal Not Present- Abdominal Pain, Bloody Stool, Constipation, Diarrhea, Difficulty Swallowing, Heartburn, Jaundice, Loss of appetitie, Nausea and Vomiting. Male Genitourinary Present- Weak urinary stream. Not Present- Blood in Urine, Discharge, Flank Pain, Incontinence, Painful Urination, Urgency, Urinary frequency, Urinary Retention and Urinating at Night. Musculoskeletal Present- Joint Pain and Joint Swelling. Not Present- Back Pain, Morning Stiffness, Muscle Pain, Muscle Weakness and Spasms. Neurological Not Present- Blackout spells, Difficulty with balance, Dizziness, Paralysis, Tremor and Weakness. Psychiatric Not Present- Insomnia.  Vitals  Weight: 173 lb Height: 74in Weight was reported by patient. Height was reported by patient. Body Surface Area: 2.04 m Body Mass Index: 22.21 kg/m  Pulse: 76 (Regular)  BP: 130/76 (Sitting, Right Arm, Standard)  Physical  Exam General Mental Status -Alert, cooperative and good historian. General Appearance-pleasant, Not in acute distress. Orientation-Oriented X3. Build & Nutrition-Well nourished and Well developed.  Head and Neck Head-normocephalic, atraumatic . Neck Global Assessment - supple, no bruit auscultated on the right, no bruit auscultated on the left.  Eye Vision-Wears corrective lenses(readers only). Pupil - Bilateral-Regular and Round. Motion - Bilateral-EOMI.  ENMT Note: upper denture plate   Chest and Lung Exam Auscultation Breath sounds - clear at anterior chest wall and clear at posterior chest wall. Adventitious sounds - No Adventitious sounds.  Cardiovascular Auscultation Rhythm - Regular rate and rhythm. Heart Sounds - S1 WNL and S2 WNL. Murmurs & Other Heart Sounds - Auscultation of the heart reveals - No Murmurs.  Abdomen Palpation/Percussion Tenderness - Abdomen is non-tender to palpation. Rigidity (guarding) - Abdomen is soft. Auscultation Auscultation of the abdomen reveals - Bowel sounds normal.  Male Genitourinary Note: Not done, not pertinent to present illness   Musculoskeletal Note: On exam, he is alert and oriented, in no apparent distress. Evaluation of his hips show normal range of motion, no discomfort. His left knee shows no effusion. Range is 0 to 125. No tenderness or instability. Right knee, no effusion. His range is about 5 to 125 or 130. He is very tender medially. There is no lateral tenderness. He does have a fair amount of laxity with valgus stressing. He actually does have some tenderness over the lateral collateral. He also has instability with varus testing.  RADIOGRAPHS Radiographs were reviewed and he has bone-on-bone arthritis in the medial compartment. He has some spurring out laterally and patellofemoral spurring, but not bone-on-bone.  Assessment & Plan Primary osteoarthritis of right knee (M17.11)  Note:Surgical  Plans: Right Total Knee Replacement  Disposition: Home  PCP: Dr. Loni Muse [name] - Patient has been seen preoperatively and felt to be stable for surgery.  IV TXA  Anesthesia Issues: None  VERITAS STUDY PATIENT Traditional Therapy - HHPT  Signed electronically by Joelene Millin, III PA-C

## 2016-01-16 NOTE — Op Note (Signed)
Pre-operative diagnosis- Osteoarthritis  Right knee(s)  Post-operative diagnosis- Osteoarthritis Right knee(s)  Procedure-  Right  Total Knee Arthroplasty (Depuy Attune)  Surgeon- Dione Plover. Othello Dickenson, MD  Assistant- Arlee Muslim, PA-C   Anesthesia-  Spinal  EBL-* No blood loss amount entered *   Drains Hemovac  Tourniquet time-  Total Tourniquet Time Documented: Thigh (Right) - 33 minutes Total: Thigh (Right) - 33 minutes     Complications- None  Condition-PACU - hemodynamically stable.   Brief Clinical Note  David Buck is a 75 y.o. year old male with end stage OA of his right knee with progressively worsening pain and dysfunction. He has constant pain, with activity and at rest and significant functional deficits with difficulties even with ADLs. He has had extensive non-op management including analgesics, injections of cortisone and viscosupplements, and home exercise program, but remains in significant pain with significant dysfunction. Radiographs show bone on bone arthritis medial and patellofemoral. He presents now for right Total Knee Arthroplasty.    Procedure in detail---   The patient is brought into the operating room and positioned supine on the operating table. After successful administration of  Spinal,   a tourniquet is placed high on the  Right thigh(s) and the lower extremity is prepped and draped in the usual sterile fashion. Time out is performed by the operating team and then the  Right lower extremity is wrapped in Esmarch, knee flexed and the tourniquet inflated to 300 mmHg.       A midline incision is made with a ten blade through the subcutaneous tissue to the level of the extensor mechanism. A fresh blade is used to make a medial parapatellar arthrotomy. Soft tissue over the proximal medial tibia is subperiosteally elevated to the joint line with a knife and into the semimembranosus bursa with a Cobb elevator. Soft tissue over the proximal lateral tibia is  elevated with attention being paid to avoiding the patellar tendon on the tibial tubercle. The patella is everted, knee flexed 90 degrees and the ACL and PCL are removed. Findings are bone on bone medial and patellofemoral with large global osteophytes.        The drill is used to create a starting hole in the distal femur and the canal is thoroughly irrigated with sterile saline to remove the fatty contents. The 5 degree Right  valgus alignment guide is placed into the femoral canal and the distal femoral cutting block is pinned to remove 9 mm off the distal femur. Resection is made with an oscillating saw.      The tibia is subluxed forward and the menisci are removed. The extramedullary alignment guide is placed referencing proximally at the medial aspect of the tibial tubercle and distally along the second metatarsal axis and tibial crest. The block is pinned to remove 42m off the more deficient medial  side. Resection is made with an oscillating saw. Size 8 is the most appropriate size for the tibia and the proximal tibia is prepared with the modular drill and keel punch for that size.      The femoral sizing guide is placed and size 8 is most appropriate. Rotation is marked off the epicondylar axis and confirmed by creating a rectangular flexion gap at 90 degrees. The size 8 cutting block is pinned in this rotation and the anterior, posterior and chamfer cuts are made with the oscillating saw. The intercondylar block is then placed and that cut is made.      Trial size  8 tibial component, trial size 8 posterior stabilized femur and a 10  mm posterior stabilized rotating platform insert trial is placed. Full extension is achieved with excellent varus/valgus and anterior/posterior balance throughout full range of motion. The patella is everted and thickness measured to be 27  mm. Free hand resection is taken to 15 mm, a 41 template is placed, lug holes are drilled, trial patella is placed, and it tracks  normally. Osteophytes are removed off the posterior femur with the trial in place. All trials are removed and the cut bone surfaces prepared with pulsatile lavage. Cement is mixed and once ready for implantation, the size 8 tibial implant, size  8 posterior stabilized femoral component, and the size 41 patella are cemented in place and the patella is held with the clamp. The trial insert is placed and the knee held in full extension. The Exparel (20 ml mixed with 30 ml saline) and .25% Bupivicaine, are injected into the extensor mechanism, posterior capsule, medial and lateral gutters and subcutaneous tissues.  All extruded cement is removed and once the cement is hard the permanent 10 mm posterior stabilized rotating platform insert is placed into the tibial tray.      The wound is copiously irrigated with saline solution and the extensor mechanism closed over a hemovac drain with #1 V-loc suture. The tourniquet is released for a total tourniquet time of 33  minutes. Flexion against gravity is 140 degrees and the patella tracks normally. Subcutaneous tissue is closed with 2.0 vicryl and subcuticular with running 4.0 Monocryl. The incision is cleaned and dried and steri-strips and a bulky sterile dressing are applied. The limb is placed into a knee immobilizer and the patient is awakened and transported to recovery in stable condition.      Please note that a surgical assistant was a medical necessity for this procedure in order to perform it in a safe and expeditious manner. Surgical assistant was necessary to retract the ligaments and vital neurovascular structures to prevent injury to them and also necessary for proper positioning of the limb to allow for anatomic placement of the prosthesis.   Dione Plover Vianna Venezia, MD    01/16/2016, 1:21 PM

## 2016-01-17 LAB — BASIC METABOLIC PANEL
Anion gap: 5 (ref 5–15)
BUN: 25 mg/dL — AB (ref 6–20)
CALCIUM: 8.5 mg/dL — AB (ref 8.9–10.3)
CHLORIDE: 106 mmol/L (ref 101–111)
CO2: 25 mmol/L (ref 22–32)
CREATININE: 0.76 mg/dL (ref 0.61–1.24)
GFR calc Af Amer: >60 mL/min (ref >60)
GFR calc non Af Amer: >60 mL/min (ref >60)
GLUCOSE: 134 mg/dL — AB (ref 65–99)
Potassium: 3.6 mmol/L (ref 3.5–5.1)
Sodium: 136 mmol/L (ref 135–145)

## 2016-01-17 LAB — CBC
HEMATOCRIT: 31.3 % — AB (ref 39.0–52.0)
HEMOGLOBIN: 10.6 g/dL — AB (ref 13.0–17.0)
MCH: 31.7 pg (ref 26.0–34.0)
MCHC: 33.9 g/dL (ref 30.0–36.0)
MCV: 93.7 fL (ref 78.0–100.0)
Platelets: 213 10*3/uL (ref 150–400)
RBC: 3.34 MIL/uL — ABNORMAL LOW (ref 4.22–5.81)
RDW: 12.6 % (ref 11.5–15.5)
WBC: 13.1 10*3/uL — ABNORMAL HIGH (ref 4.0–10.5)

## 2016-01-17 MED ORDER — DIAZEPAM 5 MG PO TABS
5.0000 mg | ORAL_TABLET | Freq: Four times a day (QID) | ORAL | Status: DC | PRN
  Administered 2016-01-17 – 2016-01-18 (×3): 5 mg via ORAL
  Filled 2016-01-17 (×3): qty 1

## 2016-01-17 MED ORDER — OXYCODONE HCL 5 MG PO TABS
5.0000 mg | ORAL_TABLET | ORAL | 0 refills | Status: DC | PRN
Start: 2016-01-17 — End: 2016-01-18

## 2016-01-17 MED ORDER — RIVAROXABAN 10 MG PO TABS: 10.0000 mg | ORAL_TABLET | Freq: Every day | ORAL | 0 refills | Status: DC

## 2016-01-17 MED ORDER — DIAZEPAM 5 MG PO TABS: 5.0000 mg | ORAL_TABLET | Freq: Four times a day (QID) | ORAL | 0 refills | Status: DC | PRN

## 2016-01-17 MED ORDER — TRAMADOL HCL 50 MG PO TABS: 50.0000 mg | ORAL_TABLET | Freq: Four times a day (QID) | ORAL | 1 refills | Status: DC | PRN

## 2016-01-17 NOTE — Discharge Instructions (Addendum)
° °Dr. Frank [name] °Total Joint Specialist °[facility] °[address] °[place], [place] [address] °[phone] ° °TOTAL KNEE REPLACEMENT POSTOPERATIVE DIRECTIONS ° °Knee Rehabilitation, Guidelines Following Surgery  °Results after knee surgery are often greatly improved when you follow the exercise, range of motion and muscle strengthening exercises prescribed by your doctor. Safety measures are also important to protect the knee from further injury. Any time any of these exercises cause you to have increased pain or swelling in your knee joint, decrease the amount until you are comfortable again and slowly increase them. If you have problems or questions, call your caregiver or physical therapist for advice.  ° °HOME CARE INSTRUCTIONS  °Remove items at home which could result in a fall. This includes throw rugs or furniture in walking pathways.  °· ICE to the affected knee every three hours for 30 minutes at a time and then as needed for pain and swelling.  Continue to use ice on the knee for pain and swelling from surgery. You may notice swelling that will progress down to the foot and ankle.  This is normal after surgery.  Elevate the leg when you are not up walking on it.   °· Continue to use the breathing machine which will help keep your temperature down.  It is common for your temperature to cycle up and down following surgery, especially at night when you are not up moving around and exerting yourself.  The breathing machine keeps your lungs expanded and your temperature down. °· Do not place pillow under knee, focus on keeping the knee straight while resting ° °DIET °You may resume your previous home diet once your are discharged from the hospital. ° °DRESSING / WOUND CARE / SHOWERING °You may shower 3 days after surgery, but keep the wounds dry during showering.  You may use an occlusive plastic wrap (Press'n Seal for example), NO SOAKING/SUBMERGING IN THE BATHTUB.  If the  bandage gets wet, change with a clean dry gauze.  If the incision gets wet, pat the wound dry with a clean towel. °You may start showering once you are discharged home but do not submerge the incision under water. Just pat the incision dry and apply a dry gauze dressing on daily. °Change the surgical dressing daily and reapply a dry dressing each time. ° °ACTIVITY °Walk with your walker as instructed. °Use walker as long as suggested by your caregivers. °Avoid periods of inactivity such as sitting longer than an hour when not asleep. This helps prevent blood clots.  °You may resume a sexual relationship in one month or when given the OK by your doctor.  °You may return to work once you are cleared by your doctor.  °Do not drive a car for 6 weeks or until released by you surgeon.  °Do not drive while taking narcotics. ° °WEIGHT BEARING °Weight bearing as tolerated with assist device (walker, cane, etc) as directed, use it as long as suggested by your surgeon or therapist, typically at least 4-6 weeks. ° °POSTOPERATIVE CONSTIPATION PROTOCOL °Constipation - defined medically as fewer than three stools per week and severe constipation as less than one stool per week. ° °One of the most common issues patients have following surgery is constipation.  Even if you have a regular bowel pattern at home, your normal regimen is likely to be disrupted due to multiple reasons following surgery.  Combination of anesthesia, postoperative narcotics, change in appetite and fluid intake all can affect your bowels.    In order to avoid complications following surgery, here are some recommendations in order to help you during your recovery period. ° °Colace (docusate) - Pick up an over-the-counter form of Colace or another stool softener and take twice a day as long as you are requiring postoperative pain medications.  Take with a full glass of water daily.  If you experience loose stools or diarrhea, hold the colace until you stool forms  back up.  If your symptoms do not get better within 1 week or if they get worse, check with your doctor. ° °Dulcolax (bisacodyl) - Pick up over-the-counter and take as directed by the product packaging as needed to assist with the movement of your bowels.  Take with a full glass of water.  Use this product as needed if not relieved by Colace only.  ° °MiraLax (polyethylene glycol) - Pick up over-the-counter to have on hand.  MiraLax is a solution that will increase the amount of water in your bowels to assist with bowel movements.  Take as directed and can mix with a glass of water, juice, soda, coffee, or tea.  Take if you go more than two days without a movement. °Do not use MiraLax more than once per day. Call your doctor if you are still constipated or irregular after using this medication for 7 days in a row. ° °If you continue to have problems with postoperative constipation, please contact the office for further assistance and recommendations.  If you experience "the worst abdominal pain ever" or develop nausea or vomiting, please contact the office immediatly for further recommendations for treatment. ° °ITCHING ° If you experience itching with your medications, try taking only a single pain pill, or even half a pain pill at a time.  You can also use Benadryl over the counter for itching or also to help with sleep.  ° °TED HOSE STOCKINGS °Wear the elastic stockings on both legs for three weeks following surgery during the day but you may remove then at night for sleeping. ° °MEDICATIONS °See your medication summary on the “After Visit Summary” that the nursing staff will review with you prior to discharge.  You may have some home medications which will be placed on hold until you complete the course of blood thinner medication.  It is important for you to complete the blood thinner medication as prescribed by your surgeon.  Continue your approved medications as instructed at time of  discharge. ° °PRECAUTIONS °If you experience chest pain or shortness of breath - call 911 immediately for transfer to the hospital emergency department.  °If you develop a fever greater that 101 F, purulent drainage from wound, increased redness or drainage from wound, foul odor from the wound/dressing, or calf pain - CONTACT YOUR SURGEON.   °                                                °FOLLOW-UP APPOINTMENTS °Make sure you keep all of your appointments after your operation with your surgeon and caregivers. You should call the office at the above phone number and make an appointment for approximately two weeks after the date of your surgery or on the date instructed by your surgeon outlined in the "After Visit Summary". ° ° °RANGE OF MOTION AND STRENGTHENING EXERCISES  °Rehabilitation of the knee is important following a knee injury or   an operation. After just a few days of immobilization, the muscles of the thigh which control the knee become weakened and shrink (atrophy). Knee exercises are designed to build up the tone and strength of the thigh muscles and to improve knee motion. Often times heat used for twenty to thirty minutes before working out will loosen up your tissues and help with improving the range of motion but do not use heat for the first two weeks following surgery. These exercises can be done on a training (exercise) mat, on the floor, on a table or on a bed. Use what ever works the best and is most comfortable for you Knee exercises include:  Leg Lifts - While your knee is still immobilized in a splint or cast, you can do straight leg raises. Lift the leg to 60 degrees, hold for 3 sec, and slowly lower the leg. Repeat 10-20 times 2-3 times daily. Perform this exercise against resistance later as your knee gets better.  Quad and Hamstring Sets - Tighten up the muscle on the front of the thigh (Quad) and hold for 5-10 sec. Repeat this 10-20 times hourly. Hamstring sets are done by pushing the  foot backward against an object and holding for 5-10 sec. Repeat as with quad sets.   Leg Slides: Lying on your back, slowly slide your foot toward your buttocks, bending your knee up off the floor (only go as far as is comfortable). Then slowly slide your foot back down until your leg is flat on the floor again.  Angel Wings: Lying on your back spread your legs to the side as far apart as you can without causing discomfort.  A rehabilitation program following serious knee injuries can speed recovery and prevent re-injury in the future due to weakened muscles. Contact your doctor or a physical therapist for more information on knee rehabilitation.   IF YOU ARE TRANSFERRED TO A SKILLED REHAB FACILITY If the patient is transferred to a skilled rehab facility following release from the hospital, a list of the current medications will be sent to the facility for the patient to continue.  When discharged from the skilled rehab facility, please have the facility set up the patient's [place] prior to being released. Also, the skilled facility will be responsible for providing the patient with their medications at time of release from the facility to include their pain medication, the muscle relaxants, and their blood thinner medication. If the patient is still at the rehab facility at time of the two week follow up appointment, the skilled rehab facility will also need to assist the patient in arranging follow up appointment in our office and any transportation needs.  MAKE SURE YOU:  Understand these instructions.  Get help right away if you are not doing well or get worse.    Pick up stool softner and laxative for home use following surgery while on pain medications. Do not submerge incision under water. Please use good hand washing techniques while changing dressing each day. May shower starting three days after surgery. Please use a clean towel to pat the incision dry following  showers. Continue to use ice for pain and swelling after surgery. Do not use any lotions or creams on the incision until instructed by your surgeon.  Take Xarelto for two and a half more weeks, then discontinue Xarelto. Once the patient has completed the Xarelto, they may resume the 81 mg Aspirin.   Information on my medicine - XARELTO (Rivaroxaban)  This medication education was reviewed with me or my healthcare representative as part of my discharge preparation.    Why was Xarelto prescribed for you? Xarelto was prescribed for you to reduce the risk of blood clots forming after orthopedic surgery. The medical term for these abnormal blood clots is venous thromboembolism (VTE).  What do you need to know about xarelto ? Take your Xarelto ONCE DAILY at the same time every day. You may take it either with or without food.  If you have difficulty swallowing the tablet whole, you may crush it and mix in applesauce just prior to taking your dose.  Take Xarelto exactly as prescribed by your doctor and DO NOT stop taking Xarelto without talking to the doctor who prescribed the medication.  Stopping without other VTE prevention medication to take the place of Xarelto may increase your risk of developing a clot.  After discharge, you should have regular check-up appointments with your healthcare provider that is prescribing your Xarelto.    What do you do if you miss a dose? If you miss a dose, take it as soon as you remember on the same day then continue your regularly scheduled once daily regimen the next day. Do not take two doses of Xarelto on the same day.   Important Safety Information A possible side effect of Xarelto is bleeding. You should call your healthcare provider right away if you experience any of the following: ? Bleeding from an injury or your nose that does not stop. ? Unusual colored urine (red or dark brown) or unusual colored stools (red or black). ? Unusual  bruising for unknown reasons. ? A serious fall or if you hit your head (even if there is no bleeding).  Some medicines may interact with Xarelto and might increase your risk of bleeding while on Xarelto. To help avoid this, consult your healthcare provider or pharmacist prior to using any new prescription or non-prescription medications, including herbals, vitamins, non-steroidal anti-inflammatory drugs (NSAIDs) and supplements.  This website has more information on Xarelto: [email or web address]

## 2016-01-17 NOTE — Progress Notes (Signed)
   Subjective: 1 Day Post-Op Procedure(s) (LRB): RIGHT TOTAL KNEE ARTHROPLASTY (Right) Patient reports pain as mild.  "Long night" and not much sleep. Patient seen in rounds with Dr. Wynelle Link.  Family in room at bedside. Patient is well, but has had some minor complaints of pain in the knee, requiring pain medications We will start therapy today.  Plan is to go Home after hospital stay.  Plan to go straight to outpatient on 01/20/2016 at [facility].  Objective: Vital signs in last 24 hours: Temp:  [97.5 F (36.4 C)-97.9 F (36.6 C)] 97.7 F (36.5 C) (07/25 0459) Pulse Rate:  [55-75] 58 (07/25 0459) Resp:  [9-18] 16 (07/25 0459) BP: (98-133)/(57-84) 113/70 (07/25 0459) SpO2:  [95 %-100 %] 99 % (07/25 0459) Weight:  [78.9 kg (174 lb)] 78.9 kg (174 lb) (07/24 1019)  Intake/Output from previous day:  Intake/Output Summary (Last 24 hours) at 01/17/16 0909 Last data filed at 01/17/16 0600  Gross per 24 hour  Intake             3270 ml  Output             1300 ml  Net             1970 ml    Intake/Output this shift: UOP 1200 Labs:  Recent Labs  01/17/16 0436  HGB 10.6*    Recent Labs  01/17/16 0436  WBC 13.1*  RBC 3.34*  HCT 31.3*  PLT 213    Recent Labs  01/17/16 0436  NA 136  K 3.6  CL 106  CO2 25  BUN 25*  CREATININE 0.76  GLUCOSE 134*  CALCIUM 8.5*   No results for input(s): LABPT, INR in the last 72 hours.  EXAM General - Patient is Alert, Appropriate and Oriented Extremity - Neurovascular intact Sensation intact distally Dorsiflexion/Plantar flexion intact Dressing - dressing C/D/I Motor Function - intact, moving foot and toes well on exam.  Hemovac pulled without difficulty.  Past Medical History:  Diagnosis Date  . Allergy    rhinitis  . COPD (chronic obstructive pulmonary disease) ([place])   . History of hepatitis B    recovered  . Hypertension   . Hypothyroidism   . 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 Meniere's  disease   . Osteoarthritis of right knee    Dr Percell Miller  . Pneumonia    hx of 2/17   . Posterior vitreous detachment 2011   R, body- Dr Baird [facility]  . Prostatitis   . Tinnitus     Assessment/Plan: 1 Day Post-Op Procedure(s) (LRB): RIGHT TOTAL KNEE ARTHROPLASTY (Right) Principal Problem:   OA (osteoarthritis) of knee  Estimated body mass index is 22.34 kg/m as calculated from the following:   Height as of this encounter: '6\' 2"'$  (1.88 m).   Weight as of this encounter: 78.9 kg (174 lb). Advance diet Up with therapy Plan for discharge tomorrow Discharge home.  Straight to outpatient therapy on Friday 01/20/2016.  DVT Prophylaxis - Xarelto Weight-Bearing as tolerated to right leg D/C O2 and Pulse OX and try on Room Air  Arlee Muslim, PA-C Orthopaedic Surgery 01/17/2016, 9:09 AM

## 2016-01-17 NOTE — Discharge Summary (Signed)
Physician Discharge Summary   Patient ID: David Buck MRN: [id number] DOB/AGE: July 01, 1940 75 y.o.  Admit date: 01/16/2016 Discharge date: 01/18/2016  Primary Diagnosis:  Osteoarthritis  Right knee(s)  Admission Diagnoses:  Past Medical History:  Diagnosis Date  . Allergy    rhinitis  . COPD (chronic obstructive pulmonary disease) ([place])   . History of hepatitis B    recovered  . Hypertension   . Hypothyroidism   . 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 Meniere's disease   . Osteoarthritis of right knee    Dr Percell Miller  . Pneumonia    hx of 2/17   . Posterior vitreous detachment 2011   R, body- Dr Baird Cancer  . Prostatitis   . Tinnitus    Discharge Diagnoses:   Principal Problem:   OA (osteoarthritis) of knee  Estimated body mass index is 22.34 kg/m as calculated from the following:   Height as of this encounter: _0  (1.88 m).   Weight as of this encounter: 78.9 kg (174 lb).  Procedure:  Procedure(s) (LRB): RIGHT TOTAL KNEE ARTHROPLASTY (Right)   Consults: None  HPI: David Buck is a 75 y.o. year old male with end stage OA of his right knee with progressively worsening pain and dysfunction. He has constant pain, with activity and at rest and significant functional deficits with difficulties even with ADLs. He has had extensive non-op management including analgesics, injections of cortisone and viscosupplements, and home exercise program, but remains in significant pain with significant dysfunction. Radiographs show bone on bone arthritis medial and patellofemoral. He presents now for right Total Knee Arthroplasty.    Laboratory Data: Admission on 01/16/2016  Component Date Value Ref Range Status  . WBC 01/17/2016 13.1* 4.0 - 10.5 K/uL Final  . RBC 01/17/2016 3.34* 4.22 - 5.81 MIL/uL Final  . Hemoglobin 01/17/2016 10.6* 13.0 - 17.0 g/dL Final  . HCT 01/17/2016 31.3* 39.0 - 52.0 % Final  . MCV 01/17/2016 93.7  78.0 - 100.0 fL Final  . MCH  01/17/2016 31.7  26.0 - 34.0 pg Final  . MCHC 01/17/2016 33.9  30.0 - 36.0 g/dL Final  . RDW 01/17/2016 12.6  11.5 - 15.5 % Final  . Platelets 01/17/2016 213  150 - 400 K/uL Final  . Sodium 01/17/2016 136  135 - 145 mmol/L Final  . Potassium 01/17/2016 3.6  3.5 - 5.1 mmol/L Final  . Chloride 01/17/2016 106  101 - 111 mmol/L Final  . CO2 01/17/2016 25  22 - 32 mmol/L Final  . Glucose, Bld 01/17/2016 134* 65 - 99 mg/dL Final  . BUN 01/17/2016 25* 6 - 20 mg/dL Final  . Creatinine, Ser 01/17/2016 0.76  0.61 - 1.24 mg/dL Final  . Calcium 01/17/2016 8.5* 8.9 - 10.3 mg/dL Final  . GFR calc non Af Amer 01/17/2016 >60  >60 mL/min Final  . GFR calc Af Amer 01/17/2016 >60  >60 mL/min Final   Comment: (NOTE) The eGFR has been calculated using the CKD EPI equation. This calculation has not been validated in all clinical situations. eGFR's persistently <60 mL/min signify possible Chronic Kidney Disease.   . Anion gap 01/17/2016 5  5 - 15 Final  . WBC 01/18/2016 8.4  4.0 - 10.5 K/uL Final  . RBC 01/18/2016 3.01* 4.22 - 5.81 MIL/uL Final  . Hemoglobin 01/18/2016 9.5* 13.0 - 17.0 g/dL Final  . HCT 01/18/2016 28.3* 39.0 - 52.0 % Final  . MCV 01/18/2016 94.0  78.0 - 100.0  fL Final  . MCH 01/18/2016 31.6  26.0 - 34.0 pg Final  . MCHC 01/18/2016 33.6  30.0 - 36.0 g/dL Final  . RDW 01/18/2016 12.9  11.5 - 15.5 % Final  . Platelets 01/18/2016 182  150 - 400 K/uL Final  . Sodium 01/18/2016 139  135 - 145 mmol/L Final  . Potassium 01/18/2016 3.4* 3.5 - 5.1 mmol/L Final  . Chloride 01/18/2016 108  101 - 111 mmol/L Final  . CO2 01/18/2016 27  22 - 32 mmol/L Final  . Glucose, Bld 01/18/2016 119* 65 - 99 mg/dL Final  . BUN 01/18/2016 18  6 - 20 mg/dL Final  . Creatinine, Ser 01/18/2016 0.85  0.61 - 1.24 mg/dL Final  . Calcium 01/18/2016 8.5* 8.9 - 10.3 mg/dL Final  . GFR calc non Af Amer 01/18/2016 >60  >60 mL/min Final  . GFR calc Af Amer 01/18/2016 >60  >60 mL/min Final   Comment: (NOTE) The eGFR has  been calculated using the CKD EPI equation. This calculation has not been validated in all clinical situations. eGFR's persistently <60 mL/min signify possible Chronic Kidney Disease.   Georgiann Hahn gap 01/18/2016 4* 5 - 15 Final  Hospital Outpatient Visit on 01/06/2016  Component Date Value Ref Range Status  . aPTT 01/06/2016 30  24 - 37 seconds Final  . WBC 01/06/2016 6.6  4.0 - 10.5 K/uL Final  . RBC 01/06/2016 4.47  4.22 - 5.81 MIL/uL Final  . Hemoglobin 01/06/2016 14.1  13.0 - 17.0 g/dL Final  . HCT 01/06/2016 42.3  39.0 - 52.0 % Final  . MCV 01/06/2016 94.6  78.0 - 100.0 fL Final  . MCH 01/06/2016 31.5  26.0 - 34.0 pg Final  . MCHC 01/06/2016 33.3  30.0 - 36.0 g/dL Final  . RDW 01/06/2016 12.8  11.5 - 15.5 % Final  . Platelets 01/06/2016 293  150 - 400 K/uL Final  . Sodium 01/06/2016 139  135 - 145 mmol/L Final  . Potassium 01/06/2016 3.7  3.5 - 5.1 mmol/L Final  . Chloride 01/06/2016 104  101 - 111 mmol/L Final  . CO2 01/06/2016 29  22 - 32 mmol/L Final  . Glucose, Bld 01/06/2016 82  65 - 99 mg/dL Final  . BUN 01/06/2016 23* 6 - 20 mg/dL Final  . Creatinine, Ser 01/06/2016 0.97  0.61 - 1.24 mg/dL Final  . Calcium 01/06/2016 9.8  8.9 - 10.3 mg/dL Final  . Total Protein 01/06/2016 7.7  6.5 - 8.1 g/dL Final  . Albumin 01/06/2016 4.8  3.5 - 5.0 g/dL Final  . AST 01/06/2016 25  15 - 41 U/L Final  . ALT 01/06/2016 22  17 - 63 U/L Final  . Alkaline Phosphatase 01/06/2016 59  38 - 126 U/L Final  . Total Bilirubin 01/06/2016 0.3  0.3 - 1.2 mg/dL Final  . GFR calc non Af Amer 01/06/2016 >60  >60 mL/min Final  . GFR calc Af Amer 01/06/2016 >60  >60 mL/min Final   Comment: (NOTE) The eGFR has been calculated using the CKD EPI equation. This calculation has not been validated in all clinical situations. eGFR's persistently <60 mL/min signify possible Chronic Kidney Disease.   . Anion gap 01/06/2016 6  5 - 15 Final  . Prothrombin Time 01/06/2016 13.4  11.6 - 15.2 seconds Final  . INR  01/06/2016 1.04  0.00 - 1.49 Final  . ABO/RH(D) 01/16/2016 A POS   Final  . Antibody Screen 01/16/2016 NEG   Final  . Sample Expiration 01/16/2016  01/19/2016   Final  . Extend sample reason 01/16/2016 NO TRANSFUSIONS OR PREGNANCY IN THE PAST 3 MONTHS   Final  . Color, Urine 01/06/2016 YELLOW  YELLOW Final  . APPearance 01/06/2016 CLOUDY* CLEAR Final  . Specific Gravity, Urine 01/06/2016 1.008  1.005 - 1.030 Final  . pH 01/06/2016 6.0  5.0 - 8.0 Final  . Glucose, UA 01/06/2016 NEGATIVE  NEGATIVE mg/dL Final  . Hgb urine dipstick 01/06/2016 TRACE* NEGATIVE Final  . Bilirubin Urine 01/06/2016 NEGATIVE  NEGATIVE Final  . Ketones, ur 01/06/2016 NEGATIVE  NEGATIVE mg/dL Final  . Protein, ur 01/06/2016 NEGATIVE  NEGATIVE mg/dL Final  . Nitrite 01/06/2016 NEGATIVE  NEGATIVE Final  . Leukocytes, UA 01/06/2016 NEGATIVE  NEGATIVE Final  . MRSA, PCR 01/06/2016 NEGATIVE  NEGATIVE Final  . Staphylococcus aureus 01/06/2016 NEGATIVE  NEGATIVE Final   Comment:        The Xpert SA Assay (FDA approved for NASAL specimens in patients over 14 years of age), is one component of a comprehensive surveillance program.  Test performance has been validated by [facility] for patients greater than or equal to 15 year old. It is not intended to diagnose infection nor to guide or monitor treatment.   . Squamous Epithelial / LPF 01/06/2016 0-5* NONE SEEN Final  . WBC, UA 01/06/2016 0-5  0 - 5 WBC/hpf Final  . RBC / HPF 01/06/2016 0-5  0 - 5 RBC/hpf Final  . Bacteria, UA 01/06/2016 NONE SEEN  NONE SEEN Final  . Casts 01/06/2016 HYALINE CASTS* NEGATIVE Final  . ABO/RH(D) 01/06/2016 A POS   Final  Appointment on 12/23/2015  Component Date Value Ref Range Status  . Rest HR 12/23/2015 57  bpm Final  . Rest BP 12/23/2015 128/72  mmHg Final  . Peak HR 12/23/2015 85  bpm Final  . Peak BP 12/23/2015 153/86  mmHg Final  . LV sys vol 12/23/2015 55  mL Final  . TID 12/23/2015 1.11   Final  . LV dias vol 12/23/2015  125  62 - 150 mL Final  . LHR 12/23/2015 0.28   Final  . SSS 12/23/2015 4   Final  . SRS 12/23/2015 3   Final  . SDS 12/23/2015 1   Final     X-Rays:No results found.  EKG: Orders placed or performed in visit on 12/13/15  . EKG 12-Lead     Hospital Course: David Buck is a 75 y.o. who was admitted to [facility]. They were brought to the operating room on 01/16/2016 and underwent Procedure(s): RIGHT TOTAL KNEE ARTHROPLASTY.  Patient tolerated the procedure well and was later transferred to the recovery room and then to the orthopaedic floor for postoperative care.  They were given PO and IV analgesics for pain control following their surgery.  They were given 24 hours of postoperative antibiotics of  Anti-infectives    Start     Dose/Rate Route Frequency Ordered Stop   01/16/16 2330  vancomycin (VANCOCIN) IVPB 1000 mg/200 mL premix     1,000 mg 200 mL/hr over 60 Minutes Intravenous Every 12 hours 01/16/16 1618 01/17/16 0048   01/16/16 0956  vancomycin (VANCOCIN) IVPB 1000 mg/200 mL premix     1,000 mg 200 mL/hr over 60 Minutes Intravenous On call to O.R. 01/16/16 3536 01/16/16 1227     and started on DVT prophylaxis in the form of Xarelto.   PT and OT were ordered for total joint protocol.  Discharge planning consulted to help with postop  disposition and equipment needs.  Patient had a tough night on the evening of surgery.  They started to get up OOB with therapy on day one. Hemovac drain was pulled without difficulty.  Continued to work with therapy into day two.  Dressing was changed on day two and the incision was healing well.  Patient was seen in rounds on day two and was ready to go home.  Discharge home. Straight to outpatient therapy on Friday 01/20/2016. Diet - Cardiac diet Follow up - in 2 weeks Activity - WBAT Disposition - Home Condition Upon Discharge - Good D/C Meds - See DC Summary DVT Prophylaxis - Xarelto  Discharge Instructions    Call MD / Call  911    Complete by:  As directed   If you experience chest pain or shortness of breath, CALL 911 and be transported to the hospital emergency room.  If you develope a fever above 101 F, pus (white drainage) or increased drainage or redness at the wound, or calf pain, call your surgeon's office.   Change dressing    Complete by:  As directed   Change dressing daily with sterile 4 x 4 inch gauze dressing and apply TED hose. Do not submerge the incision under water.   Constipation Prevention    Complete by:  As directed   Drink plenty of fluids.  Prune juice may be helpful.  You may use a stool softener, such as Colace (over the counter) 100 mg twice a day.  Use MiraLax (over the counter) for constipation as needed.   Diet - low sodium heart healthy    Complete by:  As directed   Discharge instructions    Complete by:  As directed   Pick up stool softner and laxative for home use following surgery while on pain medications. Do not submerge incision under water. Please use good hand washing techniques while changing dressing each day. May shower starting three days after surgery. Please use a clean towel to pat the incision dry following showers. Continue to use ice for pain and swelling after surgery. Do not use any lotions or creams on the incision until instructed by your surgeon.   Postoperative Constipation Protocol  Constipation - defined medically as fewer than three stools per week and severe constipation as less than one stool per week.  One of the most common issues patients have following surgery is constipation.  Even if you have a regular bowel pattern at home, your normal regimen is likely to be disrupted due to multiple reasons following surgery.  Combination of anesthesia, postoperative narcotics, change in appetite and fluid intake all can affect your bowels.  In order to avoid complications following surgery, here are some recommendations in order to help you during your recovery  period.  Colace (docusate) - Pick up an over-the-counter form of Colace or another stool softener and take twice a day as long as you are requiring postoperative pain medications.  Take with a full glass of water daily.  If you experience loose stools or diarrhea, hold the colace until you stool forms back up.  If your symptoms do not get better within 1 week or if they get worse, check with your doctor.  Dulcolax (bisacodyl) - Pick up over-the-counter and take as directed by the product packaging as needed to assist with the movement of your bowels.  Take with a full glass of water.  Use this product as needed if not relieved by Colace only.   MiraLax (  polyethylene glycol) - Pick up over-the-counter to have on hand.  MiraLax is a solution that will increase the amount of water in your bowels to assist with bowel movements.  Take as directed and can mix with a glass of water, juice, soda, coffee, or tea.  Take if you go more than two days without a movement. Do not use MiraLax more than once per day. Call your doctor if you are still constipated or irregular after using this medication for 7 days in a row.  If you continue to have problems with postoperative constipation, please contact the office for further assistance and recommendations.  If you experience "the worst abdominal pain ever" or develop nausea or vomiting, please contact the office immediatly for further recommendations for treatment.   Take Xarelto for two and a half more weeks, then discontinue Xarelto. Once the patient has completed the Xarelto, they may resume the 81 mg Aspirin.   Do not put a pillow under the knee. Place it under the heel.    Complete by:  As directed   Do not sit on low chairs, stoools or toilet seats, as it may be difficult to get up from low surfaces    Complete by:  As directed   Driving restrictions    Complete by:  As directed   No driving until released by the physician.   Increase activity slowly as  tolerated    Complete by:  As directed   Lifting restrictions    Complete by:  As directed   No lifting until released by the physician.   Patient may shower    Complete by:  As directed   You may shower without a dressing once there is no drainage.  Do not wash over the wound.  If drainage remains, do not shower until drainage stops.   TED hose    Complete by:  As directed   Use stockings (TED hose) for 3 weeks on both leg(s).  You may remove them at night for sleeping.   Weight bearing as tolerated    Complete by:  As directed       Medication List    STOP taking these medications   aspirin EC 81 MG tablet   EQL VITAMIN D3 1000 units tablet Generic drug:  Cholecalciferol   Glucosamine HCl 1000 MG Tabs   multivitamin per tablet   saw palmetto 160 MG capsule   vitamin C 500 MG tablet Commonly known as:  ASCORBIC ACID     TAKE these medications   diazepam 5 MG tablet Commonly known as:  VALIUM Take 1 tablet (5 mg total) by mouth every 6 (six) hours as needed for muscle spasms.   hydrochlorothiazide 12.5 MG capsule Commonly known as:  MICROZIDE TAKE 1 CAPSULE (12.5 MG TOTAL) BY MOUTH DAILY.   HYDROmorphone 2 MG tablet Commonly known as:  DILAUDID Take 1-2 tablets (2-4 mg total) by mouth every 4 (four) hours as needed for severe pain.   levothyroxine 75 MCG tablet Commonly known as:  SYNTHROID, LEVOTHROID TAKE ONE TABLET BY MOUTH DAILY   loratadine 10 MG tablet Commonly known as:  CLARITIN Take 10 mg by mouth daily.   rivaroxaban 10 MG Tabs tablet Commonly known as:  XARELTO Take 1 tablet (10 mg total) by mouth daily with breakfast. Take Xarelto for two and a half more weeks, then discontinue Xarelto. Once the patient has completed the Xarelto, they may resume the 81 mg Aspirin.   traMADol 50 MG tablet  Commonly known as:  ULTRAM Take 1-2 tablets (50-100 mg total) by mouth every 6 (six) hours as needed for moderate pain.      Follow-up Information     Gearlean Alf, MD. Schedule an appointment as soon as possible for a visit on 01/31/2016.   Specialty:  Orthopedic Surgery Why:  Call office at [phone] to setup appointment on Tuesday 01/31/2016 with Dr. Anne Fu staff. Contact information: [address] [place] [address] [phone]           Signed: Arlee Muslim, PA-C Orthopaedic Surgery 01/18/2016, 9:08 AM

## 2016-01-17 NOTE — Evaluation (Signed)
Occupational Therapy Evaluation Patient Details Name: David Buck MRN: [id number] DOB: 01-14-1941 Today's Date: 01/17/2016    History of Present Illness s/p R TKA   Clinical Impression   This 75 year old man was admitted for the above sx. All education was completed. No further OT is needed at this time    Follow Up Recommendations  Supervision/Assistance - 24 hour    Equipment Recommendations  None recommended by OT    Recommendations for Other Services       Precautions / Restrictions Precautions Precautions: Knee Precaution Comments:  (pt reports he has not been using KI) Required Braces or Orthoses: Knee Immobilizer - Right Knee Immobilizer - Right: Discontinue once straight leg raise with < 10 degree lag Restrictions Weight Bearing Restrictions: No RLE Weight Bearing: Weight bearing as tolerated Other Position/Activity Restrictions: WBAT      Mobility Bed Mobility done by PT Overal bed mobility: Needs Assistance Bed Mobility: Supine to Sit     Supine to sit: Supervision     General bed mobility comments: cues for technique  Transfers Overall transfer level: Needs assistance Equipment used: Rolling walker (2 wheeled) Transfers: Sit to/from Stand Sit to Stand: Min guard         General transfer comment: for safety; no cues    Balance                                            ADL Overall ADL's : Needs assistance/impaired     Grooming: Supervision/safety;Standing       Lower Body Bathing: Minimal assistance;Sit to/from stand       Lower Body Dressing: Minimal assistance;Sit to/from stand   Toilet Transfer: Min guard;Ambulation;BSC;RW       Tub/ Shower Transfer: Walk-in shower;Min guard;Ambulation     General ADL Comments: pt will have assistance for adls at home.  Practiced bathroom transfers and pt feels comfortable with these.  Educated on precautions     Vision     Perception     Praxis       Pertinent Vitals/Pain Pain Assessment: 0-10 Pain Score: 2  Pain Location: R knee Pain Descriptors / Indicators: Aching Pain Intervention(s): Limited activity within patient's tolerance;Monitored during session;Premedicated before session;Repositioned;Ice applied     Hand Dominance     Extremity/Trunk Assessment Upper Extremity Assessment Upper Extremity Assessment: Overall WFL for tasks assessed   Lower Extremity Assessment Lower Extremity Assessment: RLE deficits/detail RLE Deficits / Details: ankle WFL, knee extension and hip flexion grossly 3/5       Communication Communication Communication: No difficulties   Cognition Arousal/Alertness: Awake/alert Behavior During Therapy: WFL for tasks assessed/performed Overall Cognitive Status: Within Functional Limits for tasks assessed                     General Comments       Exercises       Shoulder Instructions      Home Living Family/patient expects to be discharged to:: Private residence Living Arrangements: Spouse/significant other Available Help at Discharge: Family Type of Home: House Home Access: Stairs to enter Technical brewer of Steps: 3 Entrance Stairs-Rails: None Home Layout: One level     Bathroom Shower/Tub: Occupational psychologist: Standard     Home Equipment: Environmental consultant - 2 wheels;Bedside commode;Grab bars - tub/shower  Prior Functioning/Environment Level of Independence: Independent             OT Diagnosis: Acute pain   OT Problem List:     OT Treatment/Interventions:      OT Goals(Current goals can be found in the care plan section) Acute Rehab OT Goals Patient Stated Goal: none stated OT Goal Formulation: All assessment and education complete, DC therapy  OT Frequency:     Barriers to D/C:            Co-evaluation              End of Session    Activity Tolerance: Patient tolerated treatment well Patient left: in chair;with call  bell/phone within reach;with chair alarm set;with family/visitor present   Time: 1038-1050 OT Time Calculation (min): 12 min Charges:  OT General Charges $OT Visit: 1 Procedure OT Evaluation $OT Eval Low Complexity: 1 Procedure G-Codes:    Swayze Pries Jan 24, 2016, 11:15 AM Lesle Chris, OTR/L [phone] Jan 24, 2016

## 2016-01-17 NOTE — Evaluation (Signed)
Physical Therapy Evaluation Patient Details Name: David Buck MRN: [id number] DOB: 07/14/1940 Today's Date: 01/17/2016   History of Present Illness  s/p R TKA  Clinical Impression  Pt is s/p TKA resulting in the deficits listed below (see PT Problem List). Pt will benefit from skilled PT to increase their independence and safety with mobility to allow discharge to the venue listed below.      Follow Up Recommendations Home health PT;Outpatient PT    Equipment Recommendations  None recommended by PT    Recommendations for Other Services       Precautions / Restrictions Precautions Precautions: Knee Precaution Comments: did not use KI, IND SLRs Required Braces or Orthoses: Knee Immobilizer - Right Knee Immobilizer - Right: Discontinue once straight leg raise with < 10 degree lag Restrictions Weight Bearing Restrictions: No RLE Weight Bearing: Weight bearing as tolerated Other Position/Activity Restrictions: WBAT      Mobility  Bed Mobility Overal bed mobility: Needs Assistance Bed Mobility: Supine to Sit     Supine to sit: Supervision     General bed mobility comments: cues for technique  Transfers Overall transfer level: Needs assistance Equipment used: Rolling walker (2 wheeled) Transfers: Sit to/from Stand Sit to Stand: Min guard         General transfer comment: cues for hand placement  Ambulation/Gait Ambulation/Gait assistance: Min guard Ambulation Distance (Feet): 120 Feet Assistive device: Rolling walker (2 wheeled) Gait Pattern/deviations: Step-to pattern;Step-through pattern     General Gait Details: cues for sequence, posture, RW position from self  Stairs            Wheelchair Mobility    Modified Rankin (Stroke Patients Only)       Balance                                             Pertinent Vitals/Pain Pain Assessment: 0-10 Pain Score: 2  Pain Location: R knee Pain Descriptors / Indicators:  Aching Pain Intervention(s): Limited activity within patient's tolerance;Monitored during session;Premedicated before session;Repositioned;Ice applied    Home Living Family/patient expects to be discharged to:: Private residence Living Arrangements: Spouse/significant other Available Help at Discharge: Family Type of Home: House Home Access: Stairs to enter Entrance Stairs-Rails: None Entrance Stairs-Number of Steps: 3 Home Layout: One level Home Equipment: Environmental consultant - 2 wheels;Bedside commode;Grab bars - tub/shower      Prior Function Level of Independence: Independent               Hand Dominance        Extremity/Trunk Assessment   Upper Extremity Assessment: Overall WFL for tasks assessed           Lower Extremity Assessment: RLE deficits/detail RLE Deficits / Details: ankle WFL, knee extension and hip flexion grossly 3/5       Communication   Communication: No difficulties  Cognition Arousal/Alertness: Awake/alert Behavior During Therapy: WFL for tasks assessed/performed Overall Cognitive Status: Within Functional Limits for tasks assessed                      General Comments      Exercises Total Joint Exercises Ankle Circles/Pumps: AROM;Both;10 reps Quad Sets: Both;5 reps;AROM Knee Flexion: AROM;5 reps;Right;Seated      Assessment/Plan    PT Assessment Patient needs continued PT services  PT Diagnosis Difficulty walking   PT Problem List Decreased  strength;Decreased activity tolerance;Decreased balance;Decreased range of motion;Decreased mobility;Decreased knowledge of use of DME;Decreased knowledge of precautions  PT Treatment Interventions DME instruction;Gait training;Functional mobility training;Stair training;Therapeutic activities;Therapeutic exercise;Patient/family education   PT Goals (Current goals can be found in the Care Plan section) Acute Rehab PT Goals Patient Stated Goal: return to IND PT Goal Formulation: With  patient Time For Goal Achievement: 01/24/16 Potential to Achieve Goals: Good    Frequency 7X/week   Barriers to discharge        Co-evaluation               End of Session Equipment Utilized During Treatment: Gait belt Activity Tolerance: Patient tolerated treatment well Patient left: in chair;with call bell/phone within reach;with chair alarm set Nurse Communication: Mobility status         Time: 0937-1000 PT Time Calculation (min) (ACUTE ONLY): 23 min   Charges:   PT Evaluation $PT Eval Low Complexity: 1 Procedure PT Treatments $Gait Training: 8-22 mins   PT G Codes:        Briea Mcenery 2016-01-30, 11:15 AM

## 2016-01-17 NOTE — Progress Notes (Signed)
   01/17/16 1200  PT Visit Information  Last PT Received On 01/17/16  Assistance Needed +1  History of Present Illness s/p R TKA  Subjective Data  Patient Stated Goal return to IND  Precautions  Precautions Knee  Precaution Comments did not use KI, IND SLRs  Restrictions  Weight Bearing Restrictions No  RLE Weight Bearing WBAT  Pain Assessment  Pain Assessment 0-10  Pain Score 4  Pain Location R knee  Pain Descriptors / Indicators Sore  Pain Intervention(s) Limited activity within patient's tolerance;Monitored during session;Premedicated before session;Ice applied  Cognition  Arousal/Alertness Awake/alert  Behavior During Therapy WFL for tasks assessed/performed  Overall Cognitive Status Within Functional Limits for tasks assessed  Bed Mobility  Overal bed mobility Needs Assistance  Bed Mobility Sit to Supine  Sit to supine Min assist  General bed mobility comments cues for technique, assist with RLE  Transfers  Overall transfer level Needs assistance  Equipment used Rolling walker (2 wheeled)  Transfers Sit to/from Stand  Sit to Stand Min guard  General transfer comment cues for hand placement  Ambulation/Gait  Ambulation/Gait assistance Min guard  Ambulation Distance (Feet) 140 Feet  Assistive device Rolling walker (2 wheeled)  Gait Pattern/deviations Step-to pattern;Step-through pattern;Antalgic  General Gait Details cues for sequence, posture, RW position from self  Total Joint Exercises  Ankle Circles/Pumps AROM;Both;10 reps  Quad Sets AROM;Both;10 reps  Short Arc Quad AROM;Strengthening;Right;10 reps  Heel Slides AAROM;Right;15 reps  Hip ABduction/ADduction AROM;Strengthening;Right;10 reps  Straight Leg Raises AROM;Strengthening;Right;10 reps  Goniometric ROM ~ 10 to 65*  PT - End of Session  Equipment Utilized During Treatment Gait belt  Activity Tolerance Patient tolerated treatment well  Patient left in bed;with call bell/phone within reach;with bed alarm  set;with family/visitor present  Nurse Communication Mobility status  PT - Assessment/Plan  PT Plan Current plan remains appropriate  PT Frequency (ACUTE ONLY) 7X/week  Follow Up Recommendations Home health PT;Outpatient PT  PT equipment None recommended by PT  PT Goal Progression  Progress towards PT goals Progressing toward goals  Acute Rehab PT Goals  PT Goal Formulation With patient  Time For Goal Achievement 01/24/16  Potential to Achieve Goals Good  PT Time Calculation  PT Start Time (ACUTE ONLY) 1211  PT Stop Time (ACUTE ONLY) 1250  PT Time Calculation (min) (ACUTE ONLY) 39 min  PT General Charges  $$ ACUTE PT VISIT 1 Procedure  PT Treatments  $Gait Training 8-22 mins  $Therapeutic Exercise 23-37 mins

## 2016-01-18 LAB — BASIC METABOLIC PANEL
Anion gap: 4 — ABNORMAL LOW (ref 5–15)
BUN: 18 mg/dL (ref 6–20)
CALCIUM: 8.5 mg/dL — AB (ref 8.9–10.3)
CO2: 27 mmol/L (ref 22–32)
CREATININE: 0.85 mg/dL (ref 0.61–1.24)
Chloride: 108 mmol/L (ref 101–111)
GFR calc Af Amer: >60 mL/min (ref >60)
Glucose, Bld: 119 mg/dL — ABNORMAL HIGH (ref 65–99)
Potassium: 3.4 mmol/L — ABNORMAL LOW (ref 3.5–5.1)
SODIUM: 139 mmol/L (ref 135–145)

## 2016-01-18 LAB — CBC
HCT: 28.3 % — ABNORMAL LOW (ref 39.0–52.0)
Hemoglobin: 9.5 g/dL — ABNORMAL LOW (ref 13.0–17.0)
MCH: 31.6 pg (ref 26.0–34.0)
MCHC: 33.6 g/dL (ref 30.0–36.0)
MCV: 94 fL (ref 78.0–100.0)
PLATELETS: 182 10*3/uL (ref 150–400)
RBC: 3.01 MIL/uL — ABNORMAL LOW (ref 4.22–5.81)
RDW: 12.9 % (ref 11.5–15.5)
WBC: 8.4 10*3/uL (ref 4.0–10.5)

## 2016-01-18 MED ORDER — DIAZEPAM 5 MG PO TABS: 5.0000 mg | ORAL_TABLET | Freq: Four times a day (QID) | ORAL | 0 refills | Status: DC | PRN

## 2016-01-18 MED ORDER — TRAMADOL HCL 50 MG PO TABS: 50.0000 mg | ORAL_TABLET | Freq: Four times a day (QID) | ORAL | 1 refills | Status: DC | PRN

## 2016-01-18 MED ORDER — POTASSIUM CHLORIDE CRYS ER 20 MEQ PO TBCR
40.0000 meq | EXTENDED_RELEASE_TABLET | Freq: Once | ORAL | Status: AC
  Administered 2016-01-18: 40 meq via ORAL
  Filled 2016-01-18: qty 2

## 2016-01-18 MED ORDER — RIVAROXABAN 10 MG PO TABS: 10.0000 mg | ORAL_TABLET | Freq: Every day | ORAL | 0 refills | Status: DC

## 2016-01-18 MED ORDER — HYDROMORPHONE HCL 2 MG PO TABS: 2.0000 mg | ORAL_TABLET | ORAL | 0 refills | Status: DC | PRN

## 2016-01-18 MED ORDER — HYDROMORPHONE HCL 2 MG PO TABS
2.0000 mg | ORAL_TABLET | ORAL | Status: DC | PRN
  Administered 2016-01-18 (×2): 2 mg via ORAL
  Filled 2016-01-18 (×2): qty 1

## 2016-01-18 NOTE — Progress Notes (Signed)
   Subjective: 2 Days Post-Op Procedure(s) (LRB): RIGHT TOTAL KNEE ARTHROPLASTY (Right) Patient reports pain as mild.   Patient seen in rounds with Dr. Wynelle Link.  Main complaint is hiccups. Patient is well, but has had some minor complaints of pain in the knee, requiring pain medications Patient is ready to go home   Objective: Vital signs in last 24 hours: Temp:  [97.4 F (36.3 C)-98.3 F (36.8 C)] 98 F (36.7 C) (07/26 8182) Pulse Rate:  [63-82] 82 (07/26 0633) Resp:  [16-18] 16 (07/26 0633) BP: (99-117)/(59-69) 99/59 (07/26 9937) SpO2:  [97 %-99 %] 97 % (07/26 1696)  Intake/Output from previous day:  Intake/Output Summary (Last 24 hours) at 01/18/16 0906 Last data filed at 01/17/16 2242  Gross per 24 hour  Intake          1248.33 ml  Output             1475 ml  Net          -226.67 ml    Intake/Output this shift: No intake/output data recorded.  Labs:  Recent Labs  01/17/16 0436 01/18/16 0442  HGB 10.6* 9.5*    Recent Labs  01/17/16 0436 01/18/16 0442  WBC 13.1* 8.4  RBC 3.34* 3.01*  HCT 31.3* 28.3*  PLT 213 182    Recent Labs  01/17/16 0436 01/18/16 0442  NA 136 139  K 3.6 3.4*  CL 106 108  CO2 25 27  BUN 25* 18  CREATININE 0.76 0.85  GLUCOSE 134* 119*  CALCIUM 8.5* 8.5*   No results for input(s): LABPT, INR in the last 72 hours.  EXAM: General - Patient is Alert and Appropriate Extremity - Neurovascular intact Sensation intact distally Dorsiflexion/Plantar flexion intact Incision - clean, no drainage Motor Function - intact, moving foot and toes well on exam.   Assessment/Plan: 2 Days Post-Op Procedure(s) (LRB): RIGHT TOTAL KNEE ARTHROPLASTY (Right) Procedure(s) (LRB): RIGHT TOTAL KNEE ARTHROPLASTY (Right) Past Medical History:  Diagnosis Date  . Allergy    rhinitis  . COPD (chronic obstructive pulmonary disease) ([place])   . History of hepatitis B    recovered  . Hypertension   . Hypothyroidism   . LAFB (left anterior  fascicular block) 2008   on EKG  . Meniere's disease   . Osteoarthritis of right knee    Dr Percell Miller  . Pneumonia    hx of 2/17   . Posterior vitreous detachment 2011   R, body- Dr Baird Cancer  . Prostatitis   . Tinnitus    Principal Problem:   OA (osteoarthritis) of knee  Estimated body mass index is 22.34 kg/m as calculated from the following:   Height as of this encounter: '6\' 2"'$  (1.88 m).   Weight as of this encounter: 78.9 kg (174 lb). Up with therapy  Discharge home.  Straight to outpatient therapy on Friday 01/20/2016. Diet - Cardiac diet Follow up - in 2 weeks Activity - WBAT Disposition - Home Condition Upon Discharge - Good D/C Meds - See DC Summary DVT Prophylaxis - Xarelto  Arlee Muslim, PA-C Orthopaedic Surgery 01/18/2016, 9:06 AM

## 2016-01-18 NOTE — Care Management Note (Signed)
Case Management Note  Patient Details  Name: David Buck MRN: [id number] Date of Birth: 27-Nov-1940  Subjective/Objective:                  RIGHT TOTAL KNEE ARTHROPLASTY (Right) Action/Plan: Discharge planning Expected Discharge Date:  01/19/16               Expected Discharge Plan:  Home/Self Care  In-House Referral:     Discharge planning Services  CM Consult  Post Acute Care Choice:  NA Choice offered to:     DME Arranged:  N/A DME Agency:  NA  HH Arranged:  NA HH Agency:  NA  Status of Service:  Completed, signed off  If discussed at [place] of Stay Meetings, dates discussed:    Additional Comments: CM met with pt in room to confirm plan is for outpt PT; pt confirms.  Pt states he has a rolling walker at home and does not need a 3n1.  No other CM needs were communicated. Dellie Catholic, RN 01/18/2016, 12:15 PM

## 2016-01-18 NOTE — Progress Notes (Signed)
   01/18/16 1229  PT Visit Information  Last PT Received On 01/18/16  All questions & concerned addressed;    Assistance Needed +1  History of Present Illness s/p R TKA  Subjective Data  Patient Stated Goal return to IND  Precautions  Precautions Knee  Precaution Comments did not use KI, IND SLRs  Required Braces or Orthoses Knee Immobilizer - Right  Knee Immobilizer - Right Discontinue once straight leg raise with < 10 degree lag  Restrictions  RLE Weight Bearing WBAT  Pain Assessment  Pain Assessment 0-10  Pain Score 3  Pain Location right knee  Pain Descriptors / Indicators Grimacing;Guarding  Pain Intervention(s) Limited activity within patient's tolerance;Monitored during session;Premedicated before session  Cognition  Arousal/Alertness Awake/alert  Behavior During Therapy WFL for tasks assessed/performed  Overall Cognitive Status Within Functional Limits for tasks assessed  Transfers  Overall transfer level Needs assistance  Equipment used Rolling walker (2 wheeled)  Transfers Sit to/from Stand  Sit to Stand Supervision;Modified independent (Device/Increase time)  General transfer comment cues for hand placement  Ambulation/Gait  Ambulation/Gait assistance Supervision  Ambulation Distance (Feet) 80 Feet  Assistive device Rolling walker (2 wheeled)  Gait Pattern/deviations Step-to pattern;Step-through pattern  General Gait Details cues for safety with turns  Gait velocity 1.12  Gait velocity interpretation Below normal speed for age/gender  General Comments  General comments (skin integrity, edema, etc.) reviewed KI when sleeping for proper knee extension; reviewed car transfers and overall  transfer safety;  PT - End of Session  Activity Tolerance Patient tolerated treatment well  Patient left in chair;with call bell/phone within reach;with family/visitor present  Nurse Communication Mobility status  PT - Assessment/Plan  PT Plan Current plan remains appropriate   PT Frequency (ACUTE ONLY) 7X/week  Follow Up Recommendations Outpatient PT  PT equipment None recommended by PT  PT Goal Progression  Progress towards PT goals Progressing toward goals  Acute Rehab PT Goals  PT Goal Formulation With patient  Time For Goal Achievement 01/24/16  Potential to Achieve Goals Good  PT Time Calculation  PT Start Time (ACUTE ONLY) 1215  PT Stop Time (ACUTE ONLY) 1229  PT Time Calculation (min) (ACUTE ONLY) 14 min  PT General Charges  $$ ACUTE PT VISIT 1 Procedure  PT Treatments  $Gait Training 8-22 mins

## 2016-01-18 NOTE — Progress Notes (Signed)
Physical Therapy Treatment Patient Details Name: David Buck MRN: [id number] DOB: 11-13-1940 Today's Date: 01/18/2016    History of Present Illness s/p R TKA    PT Comments    Progressing well  Follow Up Recommendations  Outpatient PT     Equipment Recommendations  None recommended by PT    Recommendations for Other Services       Precautions / Restrictions Precautions Precautions: Knee Precaution Comments: did not use KI, IND SLRs Required Braces or Orthoses: Knee Immobilizer - Right Knee Immobilizer - Right: Discontinue once straight leg raise with < 10 degree lag Restrictions Weight Bearing Restrictions: No RLE Weight Bearing: Weight bearing as tolerated    Mobility  Bed Mobility Overal bed mobility: Needs Assistance Bed Mobility: Supine to Sit     Supine to sit: Supervision     General bed mobility comments: cues for technique, assist with RLE  Transfers Overall transfer level: Needs assistance Equipment used: Rolling walker (2 wheeled) Transfers: Sit to/from Stand Sit to Stand: Supervision         General transfer comment: cues for hand placement  Ambulation/Gait Ambulation/Gait assistance: Supervision;Min guard Ambulation Distance (Feet): 150 Feet Assistive device: Rolling walker (2 wheeled) Gait Pattern/deviations: Step-to pattern;Step-through pattern     General Gait Details: cues for sequence, posture, RW position from self   Stairs Stairs: Yes Stairs assistance: Min assist Stair Management: No rails;Step to pattern;Forwards;With crutches Number of Stairs: 3 General stair comments: cues for sequence and safe technique  Wheelchair Mobility    Modified Rankin (Stroke Patients Only)       Balance                                    Cognition Arousal/Alertness: Awake/alert Behavior During Therapy: WFL for tasks assessed/performed Overall Cognitive Status: Within Functional Limits for tasks assessed                       Exercises Total Joint Exercises Ankle Circles/Pumps: AROM;Both;10 reps Quad Sets: AROM;Both;10 reps Heel Slides: AAROM;Right;15 reps Hip ABduction/ADduction: AROM;Strengthening;Right;10 reps Straight Leg Raises: AROM;Strengthening;Right;10 reps Knee Flexion: AROM;5 reps;Right;Seated Goniometric ROM: ~8 to  to 78*    General Comments        Pertinent Vitals/Pain Pain Assessment: 0-10 Pain Score: 4  Pain Location: right knee Pain Descriptors / Indicators: Burning Pain Intervention(s): Limited activity within patient's tolerance;Monitored during session;Premedicated before session;Ice applied    Home Living                      Prior Function            PT Goals (current goals can now be found in the care plan section) Acute Rehab PT Goals Patient Stated Goal: return to IND PT Goal Formulation: With patient Time For Goal Achievement: 01/24/16 Potential to Achieve Goals: Good Progress towards PT goals: Progressing toward goals    Frequency  7X/week    PT Plan Current plan remains appropriate    Co-evaluation             End of Session Equipment Utilized During Treatment: Gait belt Activity Tolerance: Patient tolerated treatment well Patient left: in chair;with call bell/phone within reach;with family/visitor present     Time: 3354-5625 PT Time Calculation (min) (ACUTE ONLY): 43 min  Charges:  $Gait Training: 23-37 mins $Therapeutic Exercise: 8-22 mins  G CodesKenyon Ana 01/18/2016, 12:08 PM

## 2016-01-20 ENCOUNTER — Telehealth: Payer: Self-pay | Admitting: *Deleted

## 2016-01-20 DIAGNOSIS — M25561 Pain in right knee: Secondary | ICD-10-CM | POA: Diagnosis not present

## 2016-01-20 NOTE — Telephone Encounter (Signed)
Pt was on the TCM list admitted for surgery had (R) total knee replacement. Pt was d/c 7/27, and will f/u w/Dr. Maureen Ralphs in 2 wks...David Buck

## 2016-01-23 DIAGNOSIS — M25561 Pain in right knee: Secondary | ICD-10-CM | POA: Diagnosis not present

## 2016-01-24 DIAGNOSIS — M25561 Pain in right knee: Secondary | ICD-10-CM | POA: Diagnosis not present

## 2016-01-27 DIAGNOSIS — M25561 Pain in right knee: Secondary | ICD-10-CM | POA: Diagnosis not present

## 2016-01-30 DIAGNOSIS — Z471 Aftercare following joint replacement surgery: Secondary | ICD-10-CM | POA: Diagnosis not present

## 2016-01-30 DIAGNOSIS — M25561 Pain in right knee: Secondary | ICD-10-CM | POA: Diagnosis not present

## 2016-01-30 DIAGNOSIS — Z96651 Presence of right artificial knee joint: Secondary | ICD-10-CM | POA: Diagnosis not present

## 2016-01-31 DIAGNOSIS — M25561 Pain in right knee: Secondary | ICD-10-CM | POA: Diagnosis not present

## 2016-02-03 ENCOUNTER — Ambulatory Visit: Payer: Medicare Other | Admitting: Internal Medicine

## 2016-02-03 DIAGNOSIS — M25561 Pain in right knee: Secondary | ICD-10-CM | POA: Diagnosis not present

## 2016-02-06 ENCOUNTER — Ambulatory Visit: Payer: Medicare Other | Admitting: Internal Medicine

## 2016-02-06 DIAGNOSIS — M25561 Pain in right knee: Secondary | ICD-10-CM | POA: Diagnosis not present

## 2016-02-07 DIAGNOSIS — M25561 Pain in right knee: Secondary | ICD-10-CM | POA: Diagnosis not present

## 2016-02-10 DIAGNOSIS — M25561 Pain in right knee: Secondary | ICD-10-CM | POA: Diagnosis not present

## 2016-02-14 DIAGNOSIS — M25561 Pain in right knee: Secondary | ICD-10-CM | POA: Diagnosis not present

## 2016-02-16 DIAGNOSIS — M25561 Pain in right knee: Secondary | ICD-10-CM | POA: Diagnosis not present

## 2016-02-17 DIAGNOSIS — M25561 Pain in right knee: Secondary | ICD-10-CM | POA: Diagnosis not present

## 2016-02-20 DIAGNOSIS — M25561 Pain in right knee: Secondary | ICD-10-CM | POA: Diagnosis not present

## 2016-02-21 DIAGNOSIS — Z96651 Presence of right artificial knee joint: Secondary | ICD-10-CM | POA: Diagnosis not present

## 2016-02-21 DIAGNOSIS — Z471 Aftercare following joint replacement surgery: Secondary | ICD-10-CM | POA: Diagnosis not present

## 2016-02-23 DIAGNOSIS — M25561 Pain in right knee: Secondary | ICD-10-CM | POA: Diagnosis not present

## 2016-02-24 DIAGNOSIS — M25561 Pain in right knee: Secondary | ICD-10-CM | POA: Diagnosis not present

## 2016-02-28 DIAGNOSIS — M25561 Pain in right knee: Secondary | ICD-10-CM | POA: Diagnosis not present

## 2016-03-01 DIAGNOSIS — M25561 Pain in right knee: Secondary | ICD-10-CM | POA: Diagnosis not present

## 2016-03-02 DIAGNOSIS — M25561 Pain in right knee: Secondary | ICD-10-CM | POA: Diagnosis not present

## 2016-03-05 DIAGNOSIS — M25561 Pain in right knee: Secondary | ICD-10-CM | POA: Diagnosis not present

## 2016-03-06 DIAGNOSIS — M25561 Pain in right knee: Secondary | ICD-10-CM | POA: Diagnosis not present

## 2016-03-09 DIAGNOSIS — M25561 Pain in right knee: Secondary | ICD-10-CM | POA: Diagnosis not present

## 2016-03-12 DIAGNOSIS — M25561 Pain in right knee: Secondary | ICD-10-CM | POA: Diagnosis not present

## 2016-03-13 DIAGNOSIS — M25561 Pain in right knee: Secondary | ICD-10-CM | POA: Diagnosis not present

## 2016-03-16 DIAGNOSIS — M25561 Pain in right knee: Secondary | ICD-10-CM | POA: Diagnosis not present

## 2016-03-19 DIAGNOSIS — M25561 Pain in right knee: Secondary | ICD-10-CM | POA: Diagnosis not present

## 2016-03-20 DIAGNOSIS — M25561 Pain in right knee: Secondary | ICD-10-CM | POA: Diagnosis not present

## 2016-03-22 DIAGNOSIS — Z96651 Presence of right artificial knee joint: Secondary | ICD-10-CM | POA: Diagnosis not present

## 2016-03-22 DIAGNOSIS — Z471 Aftercare following joint replacement surgery: Secondary | ICD-10-CM | POA: Diagnosis not present

## 2016-04-13 ENCOUNTER — Ambulatory Visit: Payer: Medicare Other | Admitting: Internal Medicine

## 2016-04-17 DIAGNOSIS — Z96651 Presence of right artificial knee joint: Secondary | ICD-10-CM | POA: Diagnosis not present

## 2016-04-17 DIAGNOSIS — Z471 Aftercare following joint replacement surgery: Secondary | ICD-10-CM | POA: Diagnosis not present

## 2016-04-24 ENCOUNTER — Other Ambulatory Visit (INDEPENDENT_AMBULATORY_CARE_PROVIDER_SITE_OTHER): Payer: Medicare Other

## 2016-04-24 ENCOUNTER — Ambulatory Visit (INDEPENDENT_AMBULATORY_CARE_PROVIDER_SITE_OTHER): Payer: Medicare Other | Admitting: Internal Medicine

## 2016-04-24 ENCOUNTER — Encounter: Payer: Self-pay | Admitting: Internal Medicine

## 2016-04-24 VITALS — BP 114/80 | HR 71 | Temp 98.1°F | Wt 175.0 lb

## 2016-04-24 DIAGNOSIS — I10 Essential (primary) hypertension: Secondary | ICD-10-CM

## 2016-04-24 DIAGNOSIS — Z23 Encounter for immunization: Secondary | ICD-10-CM

## 2016-04-24 DIAGNOSIS — G459 Transient cerebral ischemic attack, unspecified: Secondary | ICD-10-CM

## 2016-04-24 DIAGNOSIS — E034 Atrophy of thyroid (acquired): Secondary | ICD-10-CM

## 2016-04-24 DIAGNOSIS — I2510 Atherosclerotic heart disease of native coronary artery without angina pectoris: Secondary | ICD-10-CM

## 2016-04-24 LAB — CBC WITH DIFFERENTIAL/PLATELET
BASOS ABS: 0 10*3/uL (ref 0.0–0.1)
Basophils Relative: 0.4 % (ref 0.0–3.0)
EOS ABS: 0.5 10*3/uL (ref 0.0–0.7)
Eosinophils Relative: 7 % — ABNORMAL HIGH (ref 0.0–5.0)
HCT: 42.6 % (ref 39.0–52.0)
Hemoglobin: 14.2 g/dL (ref 13.0–17.0)
LYMPHS ABS: 1.7 10*3/uL (ref 0.7–4.0)
Lymphocytes Relative: 21.4 % (ref 12.0–46.0)
MCHC: 33.4 g/dL (ref 30.0–36.0)
MCV: 92.2 fl (ref 78.0–100.0)
Monocytes Absolute: 0.6 10*3/uL (ref 0.1–1.0)
Monocytes Relative: 8.1 % (ref 3.0–12.0)
NEUTROS ABS: 4.9 10*3/uL (ref 1.4–7.7)
NEUTROS PCT: 63.1 % (ref 43.0–77.0)
PLATELETS: 301 10*3/uL (ref 150.0–400.0)
RBC: 4.62 Mil/uL (ref 4.22–5.81)
RDW: 13.2 % (ref 11.5–15.5)
WBC: 7.8 10*3/uL (ref 4.0–10.5)

## 2016-04-24 LAB — BASIC METABOLIC PANEL
BUN: 21 mg/dL (ref 6–23)
CALCIUM: 10.4 mg/dL (ref 8.4–10.5)
CO2: 29 meq/L (ref 19–32)
CREATININE: 0.94 mg/dL (ref 0.40–1.50)
Chloride: 101 mEq/L (ref 96–112)
GFR: 83.33 mL/min (ref >60.00)
GLUCOSE: 88 mg/dL (ref 70–99)
Potassium: 3.9 mEq/L (ref 3.5–5.1)
Sodium: 138 mEq/L (ref 135–145)

## 2016-04-24 LAB — TSH: TSH: 3.53 u[IU]/mL (ref 0.35–4.50)

## 2016-04-24 NOTE — Assessment & Plan Note (Signed)
Labs Levothroid 

## 2016-04-24 NOTE — Progress Notes (Signed)
Subjective:  Patient ID: David Buck, male    DOB: 04-06-1941  Age: 75 y.o. MRN: [id number]  CC: No chief complaint on file.   HPI David Buck presents for hypothyroidism, insomnia, elevated BP C/o numbness in the R hand fingers #5-4  Outpatient Medications Prior to Visit  Medication Sig Dispense Refill  . hydrochlorothiazide (MICROZIDE) 12.5 MG capsule TAKE 1 CAPSULE (12.5 MG TOTAL) BY MOUTH DAILY. 90 capsule 3  . levothyroxine (SYNTHROID, LEVOTHROID) 75 MCG tablet TAKE ONE TABLET BY MOUTH DAILY 60 tablet 4  . loratadine (CLARITIN) 10 MG tablet Take 10 mg by mouth daily.     . diazepam (VALIUM) 5 MG tablet Take 1 tablet (5 mg total) by mouth every 6 (six) hours as needed for muscle spasms. (Patient not taking: Reported on 04/24/2016) 90 tablet 0  . HYDROmorphone (DILAUDID) 2 MG tablet Take 1-2 tablets (2-4 mg total) by mouth every 4 (four) hours as needed for severe pain. (Patient not taking: Reported on 04/24/2016) 80 tablet 0  . rivaroxaban (XARELTO) 10 MG TABS tablet Take 1 tablet (10 mg total) by mouth daily with breakfast. Take Xarelto for two and a half more weeks, then discontinue Xarelto. Once the patient has completed the Xarelto, they may resume the 81 mg Aspirin. (Patient not taking: Reported on 04/24/2016) 19 tablet 0  . traMADol (ULTRAM) 50 MG tablet Take 1-2 tablets (50-100 mg total) by mouth every 6 (six) hours as needed for moderate pain. (Patient not taking: Reported on 04/24/2016) 80 tablet 1   No facility-administered medications prior to visit.     ROS Review of Systems  Constitutional: Negative for appetite change, fatigue and unexpected weight change.  HENT: Negative for congestion, nosebleeds, sneezing, sore throat and trouble swallowing.   Eyes: Negative for itching and visual disturbance.  Respiratory: Negative for cough.   Cardiovascular: Negative for chest pain, palpitations and leg swelling.  Gastrointestinal: Negative for abdominal distention,  blood in stool, diarrhea and nausea.  Genitourinary: Negative for frequency and hematuria.  Musculoskeletal: Negative for back pain, gait problem, joint swelling and neck pain.  Skin: Negative for rash.  Neurological: Negative for dizziness, tremors, speech difficulty and weakness.  Psychiatric/Behavioral: Negative for agitation, dysphoric mood and sleep disturbance. The patient is not nervous/anxious.     Objective:  BP 114/80   Pulse 71   Temp 98.1 F (36.7 C) (Oral)   Wt 175 lb (79.4 kg)   SpO2 95%   BMI 22.47 kg/m   BP Readings from Last 3 Encounters:  04/24/16 114/80  01/18/16 (!) 99/59  01/06/16 121/77    Wt Readings from Last 3 Encounters:  04/24/16 175 lb (79.4 kg)  01/16/16 174 lb (78.9 kg)  01/06/16 174 lb (78.9 kg)    Physical Exam  Constitutional: He is oriented to person, place, and time. He appears well-developed. No distress.  NAD  HENT:  Mouth/Throat: Oropharynx is clear and moist.  Eyes: Conjunctivae are normal. Pupils are equal, round, and reactive to light.  Neck: Normal range of motion. No JVD present. No thyromegaly present.  Cardiovascular: Normal rate, regular rhythm, normal heart sounds and intact distal pulses.  Exam reveals no gallop and no friction rub.   No murmur heard. Pulmonary/Chest: Effort normal and breath sounds normal. No respiratory distress. He has no wheezes. He has no rales. He exhibits no tenderness.  Abdominal: Soft. Bowel sounds are normal. He exhibits no distension and no mass. There is no tenderness. There is no rebound and no  guarding.  Musculoskeletal: Normal range of motion. He exhibits edema. He exhibits no tenderness.  Lymphadenopathy:    He has no cervical adenopathy.  Neurological: He is alert and oriented to person, place, and time. He has normal reflexes. No cranial nerve deficit. He exhibits normal muscle tone. He displays a negative Romberg sign. Coordination and gait normal.  Skin: Skin is warm and dry. No rash  noted.  Psychiatric: He has a normal mood and affect. His behavior is normal. Judgment and thought content normal.  swollen R knee with a scar  Lab Results  Component Value Date   WBC 8.4 01/18/2016   HGB 9.5 (L) 01/18/2016   HCT 28.3 (L) 01/18/2016   PLT 182 01/18/2016   GLUCOSE 119 (H) 01/18/2016   CHOL 111 12/06/2014   TRIG 46.0 12/06/2014   HDL 48.00 12/06/2014   LDLCALC 54 12/06/2014   ALT 22 01/06/2016   AST 25 01/06/2016   NA 139 01/18/2016   K 3.4 (L) 01/18/2016   CL 108 01/18/2016   CREATININE 0.85 01/18/2016   BUN 18 01/18/2016   CO2 27 01/18/2016   TSH 4.46 04/18/2015   PSA 2.57 08/17/2015   INR 1.04 01/06/2016   HGBA1C 5.8 10/26/2010    No results found.  Assessment & Plan:   There are no diagnoses linked to this encounter. I am having [name] maintain his loratadine, levothyroxine, hydrochlorothiazide, diazepam, HYDROmorphone, rivaroxaban, traMADol, Cholecalciferol, multivitamin, aspirin EC, and Glucosamine HCl.  Meds ordered this encounter  Medications  . Cholecalciferol 2000 units TABS    Sig: Take 1 tablet by mouth daily.  . Multiple Vitamin (MULTIVITAMIN) tablet    Sig: Take 1 tablet by mouth daily.  Marland Kitchen aspirin EC 81 MG tablet    Sig: Take 81 mg by mouth daily.  . Glucosamine HCl 1000 MG TABS    Sig: Take 1 tablet by mouth daily.     Follow-up: No Follow-up on file.  Walker Kehr, MD

## 2016-04-24 NOTE — Addendum Note (Signed)
Addended by: Cresenciano Lick on: 04/24/2016 04:52 PM   Modules accepted: Orders

## 2016-04-24 NOTE — Assessment & Plan Note (Signed)
Remote CBC

## 2016-04-24 NOTE — Progress Notes (Signed)
Pre visit review using our clinic review tool, if applicable. No additional management support is needed unless otherwise documented below in the visit note. 

## 2016-04-24 NOTE — Patient Instructions (Signed)
Vitamin B complex 1 a day Gel pad for mouse

## 2016-04-24 NOTE — Assessment & Plan Note (Signed)
HCTZ 

## 2016-05-03 ENCOUNTER — Encounter: Payer: Self-pay | Admitting: Internal Medicine

## 2016-05-03 ENCOUNTER — Ambulatory Visit (INDEPENDENT_AMBULATORY_CARE_PROVIDER_SITE_OTHER): Payer: Medicare Other | Admitting: Internal Medicine

## 2016-05-03 VITALS — BP 124/68 | HR 77 | Ht 74.0 in | Wt 175.6 lb

## 2016-05-03 DIAGNOSIS — J3089 Other allergic rhinitis: Secondary | ICD-10-CM

## 2016-05-03 DIAGNOSIS — J302 Other seasonal allergic rhinitis: Secondary | ICD-10-CM | POA: Diagnosis not present

## 2016-05-03 DIAGNOSIS — R911 Solitary pulmonary nodule: Secondary | ICD-10-CM

## 2016-05-03 DIAGNOSIS — F17200 Nicotine dependence, unspecified, uncomplicated: Secondary | ICD-10-CM

## 2016-05-03 DIAGNOSIS — I2510 Atherosclerotic heart disease of native coronary artery without angina pectoris: Secondary | ICD-10-CM | POA: Diagnosis not present

## 2016-05-03 NOTE — Assessment & Plan Note (Signed)
Mild, variable rhinitis. Likely this is more vasomotor and nonallergic. OTC management has been sufficient with antihistamines.

## 2016-05-03 NOTE — Assessment & Plan Note (Signed)
He was able to stop for 2 weeks for knee surgery then restarted. We discussed this with encouragement to stop and stay off. Support available.

## 2016-05-03 NOTE — Progress Notes (Signed)
Subjective:    Patient ID: David Buck, male    DOB: 12-Jan-1941, 75 y.o.   MRN: [id number]    M 1 PPD smoker followed for small lung nodules/ RUL nodular scarring/ benign bronchoscopy 04/14/08- neg AFB. 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  Emphysema pattern developing in flow volume loop     02/03/15-  75 yoM 1 PPD smoker followed for small lung nodules/ RUL nodular scarring/ benign bronchoscopy 04/14/08- neg AFB.  FOLLOWS FOR: Pt states his breathing is fine; denies any SOB, wheezing, cough, or congestion.  He feels well. Still smoking. We reviewed chest x-ray and office spirometry from last visit CXR 02/01/14-  IMPRESSION: Stable 3 mm nodular opacity right upper stable from 01/29/2013 and in turn stable from 01/29/2011. This would be considered benign given long-term stability. Electronically Signed  By: Skipper Cliche M.D.  On: 02/01/2014 15:35  05/03/2016-75 year old male one pack per day smoker followed for small lung nodules/right upper lobe nodular scarring/benign bronchoscopy 04/14/2008-negative AFB FOLLOWS FOR: pt states he is doing well today, no complaints.    He stopped smoking for 2 weeks at time of right TKR but started back. Smoking discussed and cessation encouraged again. Feels well. No routine cough or phlegm. CT chest 09/21/2015 low-dose screening IMPRESSION: 1. Lung-RADS Category 2, benign appearance or behavior. Continue annual screening with low-dose chest CT without contrast in 12 months. 2. Coronary artery calcification. Myocardial Perfusion Imaging 12/23/2015-EF 56%, low risk study.  ROS-see HPI Constitutional:   No-   weight loss, night sweats, fevers, chills, fatigue, lassitude. HEENT:   No-  headaches, difficulty swallowing, tooth/dental problems, sore throat,       No-  sneezing, itching, ear ache, nasal congestion, +post nasal drip,  CV:  No-   chest pain, orthopnea, PND, swelling in  lower extremities, anasarca, dizziness, palpitations Resp: No-   shortness of breath with exertion or at rest.              No-   productive cough,  No- non-productive cough,  No- coughing up of blood.              No-   change in color of mucus.  No- wheezing.   Skin: No-   rash or lesions. GI:  No-   heartburn, indigestion, abdominal pain, nausea, vomiting,  GU:  MS:  No-   joint pain or swelling.  Neuro-     nothing unusual Psych:  No- change in mood or affect. No depression or anxiety.  No memory loss.  Objective:   Physical Exam General- Alert, Oriented, Affect-appropriate, Distress- none acute. tall slender man Skin- rash-none, lesions- none, excoriation- none Lymphadenopathy- none Head- atraumatic            Eyes- Gross vision intact, PERRLA, conjunctivae clear secretions            Ears- Hearing, canals normal            Nose- Clear, No- Septal dev, +mucus postnasal drip, polyps, erosion, perforation             Throat- Mallampati II , mucosa clear , drainage- none, tonsils- atrophic  dentures Neck- flexible , trachea midline, no stridor , thyroid nl, carotid no bruit Chest - symmetrical excursion , unlabored           Heart/CV- RRR , no murmur , no gallop  , no rub, nl s1 s2                           -  JVD- none , edema- none, stasis changes- none, varices- none           Lung- Clear, unlabored, wheeze- none, cough- none , dullness-none, rub- none           Chest wall-  Abd-  Br/ Gen/ Rectal- Not done, not indicated Extrem- cyanosis- none, clubbing, none, atrophy- none, strength- nl Neuro- grossly intact to observation    Assessment & Plan:

## 2016-05-03 NOTE — Patient Instructions (Signed)
Please stop smoking before something bad happens  Otherwise, enjoy yourself and please call if we can help

## 2016-05-03 NOTE — Assessment & Plan Note (Signed)
No symptomatic change recognized and exam today is unremarkable. Plan-consider formal PFT at next exam.

## 2016-05-03 NOTE — Assessment & Plan Note (Addendum)
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 Screening CT does not identify concerning activity. Smoking cessation is recommended strongly. He can be followed at long intervals and the standard low-dose screening schedule will probably be adequate.

## 2016-05-07 DIAGNOSIS — D485 Neoplasm of uncertain behavior of skin: Secondary | ICD-10-CM | POA: Diagnosis not present

## 2016-05-07 DIAGNOSIS — L821 Other seborrheic keratosis: Secondary | ICD-10-CM | POA: Diagnosis not present

## 2016-05-07 DIAGNOSIS — L700 Acne vulgaris: Secondary | ICD-10-CM | POA: Diagnosis not present

## 2016-05-07 DIAGNOSIS — L814 Other melanin hyperpigmentation: Secondary | ICD-10-CM | POA: Diagnosis not present

## 2016-05-07 DIAGNOSIS — D1801 Hemangioma of skin and subcutaneous tissue: Secondary | ICD-10-CM | POA: Diagnosis not present

## 2016-05-07 DIAGNOSIS — D225 Melanocytic nevi of trunk: Secondary | ICD-10-CM | POA: Diagnosis not present

## 2016-07-18 ENCOUNTER — Other Ambulatory Visit: Payer: Self-pay | Admitting: *Deleted

## 2016-07-18 MED ORDER — LEVOTHYROXINE SODIUM 75 MCG PO TABS: 75.0000 ug | ORAL_TABLET | Freq: Every day | ORAL | 3 refills | Status: DC

## 2016-07-19 DIAGNOSIS — Z471 Aftercare following joint replacement surgery: Secondary | ICD-10-CM | POA: Diagnosis not present

## 2016-07-19 DIAGNOSIS — Z96651 Presence of right artificial knee joint: Secondary | ICD-10-CM | POA: Diagnosis not present

## 2016-10-22 ENCOUNTER — Encounter: Payer: Self-pay | Admitting: Internal Medicine

## 2016-10-22 ENCOUNTER — Ambulatory Visit (INDEPENDENT_AMBULATORY_CARE_PROVIDER_SITE_OTHER): Payer: Medicare Other | Admitting: Internal Medicine

## 2016-10-22 VITALS — BP 136/74 | HR 76 | Temp 98.1°F | Ht 74.0 in | Wt 176.1 lb

## 2016-10-22 DIAGNOSIS — E034 Atrophy of thyroid (acquired): Secondary | ICD-10-CM | POA: Diagnosis not present

## 2016-10-22 DIAGNOSIS — Z0000 Encounter for general adult medical examination without abnormal findings: Secondary | ICD-10-CM

## 2016-10-22 DIAGNOSIS — I10 Essential (primary) hypertension: Secondary | ICD-10-CM

## 2016-10-22 DIAGNOSIS — J302 Other seasonal allergic rhinitis: Secondary | ICD-10-CM | POA: Diagnosis not present

## 2016-10-22 DIAGNOSIS — J3089 Other allergic rhinitis: Secondary | ICD-10-CM

## 2016-10-22 DIAGNOSIS — M1711 Unilateral primary osteoarthritis, right knee: Secondary | ICD-10-CM

## 2016-10-22 DIAGNOSIS — N320 Bladder-neck obstruction: Secondary | ICD-10-CM

## 2016-10-22 MED ORDER — HYDROCHLOROTHIAZIDE 12.5 MG PO CAPS: 12.5000 mg | ORAL_CAPSULE | Freq: Every day | ORAL | 3 refills | Status: DC

## 2016-10-22 NOTE — Progress Notes (Signed)
Subjective:  Patient ID: David Buck, male    DOB: April 13, 1941  Age: 76 y.o. MRN: [id number]  CC: No chief complaint on file.   HPI ZAIYDEN STROZIER presents for hypothyroidism, HTN, allergies f/u  Outpatient Medications Prior to Visit  Medication Sig Dispense Refill  . aspirin EC 81 MG tablet Take 81 mg by mouth daily.    . Cholecalciferol 2000 units TABS Take 1 tablet by mouth daily.    . Glucosamine HCl 1000 MG TABS Take 1 tablet by mouth daily.    . hydrochlorothiazide (MICROZIDE) 12.5 MG capsule TAKE 1 CAPSULE (12.5 MG TOTAL) BY MOUTH DAILY. 90 capsule 3  . levothyroxine (SYNTHROID, LEVOTHROID) 75 MCG tablet Take 1 tablet (75 mcg total) by mouth daily. 60 tablet 3  . loratadine (CLARITIN) 10 MG tablet Take 10 mg by mouth daily.     . Multiple Vitamin (MULTIVITAMIN) tablet Take 1 tablet by mouth daily.    . saw palmetto 160 MG capsule Take 160 mg by mouth daily.    . vitamin C (ASCORBIC ACID) 500 MG tablet Take 500 mg by mouth daily.     No facility-administered medications prior to visit.     ROS Review of Systems  Constitutional: Negative for appetite change, fatigue and unexpected weight change.  HENT: Negative for congestion, nosebleeds, sneezing, sore throat and trouble swallowing.   Eyes: Negative for itching and visual disturbance.  Respiratory: Negative for cough.   Cardiovascular: Negative for chest pain, palpitations and leg swelling.  Gastrointestinal: Negative for abdominal distention, blood in stool, diarrhea and nausea.  Genitourinary: Negative for frequency and hematuria.  Musculoskeletal: Negative for back pain, gait problem, joint swelling and neck pain.  Skin: Negative for rash.  Neurological: Negative for dizziness, tremors, speech difficulty and weakness.  Psychiatric/Behavioral: Negative for agitation, dysphoric mood, sleep disturbance and suicidal ideas. The patient is not nervous/anxious.     Objective:  BP 136/74 (BP Location: Left Arm,  Patient Position: Sitting, Cuff Size: Normal)   Pulse 76   Temp 98.1 F (36.7 C) (Oral)   Ht '6\' 2"'$  (1.88 m)   Wt 176 lb 1.3 oz (79.9 kg)   SpO2 98%   BMI 22.61 kg/m   BP Readings from Last 3 Encounters:  10/22/16 136/74  05/03/16 124/68  04/24/16 114/80    Wt Readings from Last 3 Encounters:  10/22/16 176 lb 1.3 oz (79.9 kg)  05/03/16 175 lb 9.6 oz (79.7 kg)  04/24/16 175 lb (79.4 kg)    Physical Exam  Constitutional: He is oriented to person, place, and time. He appears well-developed. No distress.  NAD  HENT:  Mouth/Throat: Oropharynx is clear and moist.  Eyes: Conjunctivae are normal. Pupils are equal, round, and reactive to light.  Neck: Normal range of motion. No JVD present. No thyromegaly present.  Cardiovascular: Normal rate, regular rhythm, normal heart sounds and intact distal pulses.  Exam reveals no gallop and no friction rub.   No murmur heard. Pulmonary/Chest: Effort normal and breath sounds normal. No respiratory distress. He has no wheezes. He has no rales. He exhibits no tenderness.  Abdominal: Soft. Bowel sounds are normal. He exhibits no distension and no mass. There is no tenderness. There is no rebound and no guarding.  Musculoskeletal: Normal range of motion. He exhibits no edema or tenderness.  Lymphadenopathy:    He has no cervical adenopathy.  Neurological: He is alert and oriented to person, place, and time. He has normal reflexes. No cranial nerve deficit. He  exhibits normal muscle tone. He displays a negative Romberg sign. Coordination and gait normal.  Skin: Skin is warm and dry. No rash noted.  Psychiatric: He has a normal mood and affect. His behavior is normal. Judgment and thought content normal.    Lab Results  Component Value Date   WBC 7.8 04/24/2016   HGB 14.2 04/24/2016   HCT 42.6 04/24/2016   PLT 301.0 04/24/2016   GLUCOSE 88 04/24/2016   CHOL 111 12/06/2014   TRIG 46.0 12/06/2014   HDL 48.00 12/06/2014   LDLCALC 54 12/06/2014     ALT 22 01/06/2016   AST 25 01/06/2016   NA 138 04/24/2016   K 3.9 04/24/2016   CL 101 04/24/2016   CREATININE 0.94 04/24/2016   BUN 21 04/24/2016   CO2 29 04/24/2016   TSH 3.53 04/24/2016   PSA 2.57 08/17/2015   INR 1.04 01/06/2016   HGBA1C 5.8 10/26/2010    No results found.  Assessment & Plan:   There are no diagnoses linked to this encounter. I am having Mr. Sloss maintain his loratadine, hydrochlorothiazide, Cholecalciferol, multivitamin, aspirin EC, Glucosamine HCl, vitamin C, saw palmetto, and levothyroxine.  No orders of the defined types were placed in this encounter.    Follow-up: No Follow-up on file.  Walker Kehr, MD

## 2016-10-22 NOTE — Assessment & Plan Note (Signed)
Labs Levothroid 

## 2016-10-22 NOTE — Patient Instructions (Signed)
  Continue to eat heart healthy diet (full of fruits, vegetables, whole grains, lean protein, water--limit salt, fat, and sugar intake) and increase physical activity as tolerated.  Continue doing brain stimulating activities (puzzles, reading, adult coloring books, staying active) to keep memory sharp.    David Buck , Thank you for taking time to come for your Medicare Wellness Visit. I appreciate your ongoing commitment to your health goals. Please review the following plan we discussed and let me know if I can assist you in the future.   These are the goals we discussed: Goals    . Maintain current health status          Continue to be active and eat healthy       This is a list of the screening recommended for you and due dates:  Health Maintenance  Topic Date Due  . Flu Shot  01/23/2017  . Tetanus Vaccine  05/10/2019  . Colon Cancer Screening  05/30/2020  . Pneumonia vaccines  Completed

## 2016-10-22 NOTE — Progress Notes (Signed)
Pre visit review using our clinic review tool, if applicable. No additional management support is needed unless otherwise documented below in the visit note. 

## 2016-10-22 NOTE — Assessment & Plan Note (Signed)
Dr Wynelle Link R TKR 2017

## 2016-10-22 NOTE — Assessment & Plan Note (Signed)
Labs HCTZ renewed

## 2016-10-22 NOTE — Progress Notes (Addendum)
Subjective:   David Buck is a 76 y.o. male who presents for Medicare Annual/Subsequent preventive examination.  Review of Systems:  No ROS.  Medicare Wellness Visit.  Cardiac Risk Factors include: advanced age (>73mn, >>68women);hypertension;male gender Sleep patterns: no sleep issues, feels rested on waking, gets up 1 times nightly to void and sleeps 7-8 hours nightly.   Home Safety/Smoke Alarms:Feels safe in home. Smoke alarms in place.   Living environment; residence and Firearm Safety: 2-story house, no firearms. Lives with wife, no needs for DME Seat Belt Safety/Bike Helmet: Wears seat belt.   Counseling:   Eye Exam- appointment yearly  Dental- appointment every 6 months  Male:   CCS-  Last 05/30/10, diverticulosis, recall 10 years    PSA-  Lab Results  Component Value Date   PSA 2.57 08/17/2015   PSA 2.64 06/04/2014   PSA 2.89 11/30/2013       Objective:    Vitals: BP 136/74 (BP Location: Left Arm, Patient Position: Sitting, Cuff Size: Normal)   Pulse 76   Temp 98.1 F (36.7 C) (Oral)   Ht '6\' 2"'$  (1.88 m)   Wt 176 lb 1.3 oz (79.9 kg)   SpO2 98%   BMI 22.61 kg/m   Body mass index is 22.61 kg/m.  Tobacco History  Smoking Status  . Current Every Day Smoker  . Packs/day: 1.00  . Years: 50.00  . Types: Cigarettes  Smokeless Tobacco  . Never Used     Ready to quit: Not Answered Counseling given: Not Answered   Past Medical History:  Diagnosis Date  . Allergy    rhinitis  . 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 History of hepatitis B    recovered  . Hypertension   . Hypothyroidism   . 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 Meniere's disease   . Osteoarthritis of right knee    Dr MPercell Miller . Pneumonia    hx of 2/17   . Posterior vitreous detachment 2011   R, body- Dr SBaird Cancer . Prostatitis   . Tinnitus    Past Surgical History:  Procedure Laterality Date  . bilateral cataract surgery     .  BRONCHOSCOPY  04-14-08   benign  . INGUINAL HERNIA REPAIR  2005   Dr MHassell Done bilateral   . right knee menisc tear repair    . 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   Procedure: RIGHT TOTAL KNEE ARTHROPLASTY;  Surgeon: FGaynelle Arabian MD;  Location: [facility];  Service: Orthopedics;  Laterality: Right;   Family History  Problem Relation Age of Onset  . Cancer Mother     ovarian and bowel  . Other Father     cerebral hemorrage  . Hypertension Other    History  Sexual Activity  . Sexual activity: Not on file    Outpatient Encounter Prescriptions as of 10/22/2016  Medication Sig  . aspirin EC 81 MG tablet Take 81 mg by mouth daily.  . Cholecalciferol 2000 units TABS Take 1 tablet by mouth daily.  . Glucosamine HCl 1000 MG TABS Take 1 tablet by mouth daily.  . hydrochlorothiazide (MICROZIDE) 12.5 MG capsule Take 1 capsule (12.5 mg total) by mouth daily.  .Marland Kitchenlevothyroxine (SYNTHROID, LEVOTHROID) 75 MCG tablet Take 1 tablet (75 mcg total) by mouth daily.  .Marland Kitchenloratadine (CLARITIN) 10 MG tablet Take 10 mg by mouth daily.   . Multiple Vitamin (MULTIVITAMIN) tablet Take 1 tablet by mouth daily.  .Marland Kitchen  saw palmetto 160 MG capsule Take 160 mg by mouth daily.  . vitamin C (ASCORBIC ACID) 500 MG tablet Take 500 mg by mouth daily.  . [DISCONTINUED] hydrochlorothiazide (MICROZIDE) 12.5 MG capsule TAKE 1 CAPSULE (12.5 MG TOTAL) BY MOUTH DAILY.   No facility-administered encounter medications on file as of 10/22/2016.     Activities of Daily Living In your present state of health, do you have any difficulty performing the following activities: 10/22/2016 01/16/2016  Hearing? N N  Vision? N N  Difficulty concentrating or making decisions? N N  Walking or climbing stairs? N Y  Dressing or bathing? N N  Doing errands, shopping? N N  Preparing Food and eating ? N -  Using the Toilet? N -  In the past six months, have you accidently leaked urine? N -  Do you have problems with loss of bowel control? N -    Managing your Medications? N -  Managing your Finances? N -  Housekeeping or managing your Housekeeping? N -  Some recent data might be hidden    Patient Care Team: Cassandria Anger, MD as PCP - General Deneise Lever, MD (Pulmonary Disease) Gatha Mayer, MD (Gastroenterology) Thurman Coyer, DO as Consulting Physician (Sports Medicine) Franchot Gallo, MD as Consulting Physician (Urology) Gaynelle Arabian, MD as Consulting Physician (Orthopedic Surgery)   Assessment:    Physical assessment deferred to PCP.  Exercise Activities and Dietary recommendations Current Exercise Habits: Home exercise routine (yard projects), Type of exercise: walking (yard projects), Time (Minutes): 60, Frequency (Times/Week): 2, Weekly Exercise (Minutes/Week): 120, Intensity: Moderate, Exercise limited by: None identified  Diet (meal preparation, eat out, water intake, caffeinated beverages, dairy products, fruits and vegetables): in general, a "healthy" diet  , well balanced, low fat/ cholesterol, low salt, limits sugar, drinks 2-3 glasses of water daily  Encouraged patient to increase daily water intake.  Goals    . Maintain current health status          Continue to be active and eat healthy      Fall Risk Fall Risk  10/22/2016 07/12/2015 07/04/2015 06/01/2015 03/28/2015  Falls in the past year? No No No No No  Risk for fall due to : - Other (Comment) Other (Comment) - Other (Comment)   Depression Screen PHQ 2/9 Scores 10/22/2016 10/22/2016 07/12/2015 07/04/2015  PHQ - 2 Score 0 0 0 0  Exception Documentation - - Other- indicate reason in comment box Other- indicate reason in comment box    Cognitive Function       Ad8 score reviewed for issues:  Issues making decisions: no  Less interest in hobbies / activities: no  Repeats questions, stories (family complaining): no  Trouble using ordinary gadgets (microwave, computer, phone): no  Forgets the month or year: no  Mismanaging  finances: no  Remembering appts:no  Daily problems with thinking and/or memory: no Ad8 score is= 0  Immunization History  Administered Date(s) Administered  . Influenza Split 04/10/2011, 04/02/2012  . Influenza Whole 05/07/2008, 04/18/2009, 04/04/2010  . Influenza, High Dose Seasonal PF 04/24/2016  . Influenza,inj,Quad PF,36+ Mos 03/20/2013, 04/09/2014, 04/18/2015  . Pneumococcal Conjugate-13 05/15/2013  . Pneumococcal Polysaccharide-23 07/23/2007  . Td 05/09/2009   Screening Tests Health Maintenance  Topic Date Due  . INFLUENZA VACCINE  01/23/2017  . TETANUS/TDAP  05/10/2019  . COLONOSCOPY  05/30/2020  . PNA vac Low Risk Adult  Completed      Plan:    Continue to eat heart healthy diet (  full of fruits, vegetables, whole grains, lean protein, water--limit salt, fat, and sugar intake) and increase physical activity as tolerated.  Continue doing brain stimulating activities (puzzles, reading, adult coloring books, staying active) to keep memory sharp.    I have personally reviewed and noted the following in the patient's chart:   . Medical and social history . Use of alcohol, tobacco or illicit drugs  . Current medications and supplements . Functional ability and status . Nutritional status . Physical activity . Advanced directives . List of other physicians . Hospitalizations, surgeries, and ER visits in previous 12 months . Vitals . Screenings to include cognitive, depression, and falls . Referrals and appointments  In addition, I have reviewed and discussed with patient certain preventive protocols, quality metrics, and best practice recommendations. A written personalized care plan for preventive services as well as general preventive health recommendations were provided to patient.     Michiel Cowboy, RN  10/22/2016  Medical screening examination/treatment/procedure(s) were performed by non-physician practitioner and as supervising physician I was immediately  available for consultation/collaboration. I agree with above. Walker Kehr, MD

## 2016-10-23 ENCOUNTER — Other Ambulatory Visit (INDEPENDENT_AMBULATORY_CARE_PROVIDER_SITE_OTHER): Payer: Medicare Other

## 2016-10-23 DIAGNOSIS — M1711 Unilateral primary osteoarthritis, right knee: Secondary | ICD-10-CM

## 2016-10-23 DIAGNOSIS — E034 Atrophy of thyroid (acquired): Secondary | ICD-10-CM

## 2016-10-23 DIAGNOSIS — N320 Bladder-neck obstruction: Secondary | ICD-10-CM

## 2016-10-23 DIAGNOSIS — I10 Essential (primary) hypertension: Secondary | ICD-10-CM

## 2016-10-23 LAB — URINALYSIS, ROUTINE W REFLEX MICROSCOPIC
Bilirubin Urine: NEGATIVE
Ketones, ur: NEGATIVE
Leukocytes, UA: NEGATIVE
Nitrite: NEGATIVE
PH: 6 (ref 5.0–8.0)
SPECIFIC GRAVITY, URINE: 1.01 (ref 1.000–1.030)
Total Protein, Urine: NEGATIVE
UROBILINOGEN UA: 0.2 (ref 0.0–1.0)
Urine Glucose: NEGATIVE
WBC, UA: NONE SEEN (ref 0–?)

## 2016-10-23 LAB — HEPATIC FUNCTION PANEL
ALT: 15 U/L (ref 0–53)
AST: 17 U/L (ref 0–37)
Albumin: 4.4 g/dL (ref 3.5–5.2)
Alkaline Phosphatase: 65 U/L (ref 39–117)
BILIRUBIN DIRECT: 0.2 mg/dL (ref 0.0–0.3)
BILIRUBIN TOTAL: 0.5 mg/dL (ref 0.2–1.2)
TOTAL PROTEIN: 6.8 g/dL (ref 6.0–8.3)

## 2016-10-23 LAB — CBC WITH DIFFERENTIAL/PLATELET
BASOS ABS: 0 10*3/uL (ref 0.0–0.1)
Basophils Relative: 0.7 % (ref 0.0–3.0)
EOS ABS: 0.4 10*3/uL (ref 0.0–0.7)
Eosinophils Relative: 6.2 % — ABNORMAL HIGH (ref 0.0–5.0)
HEMATOCRIT: 42 % (ref 39.0–52.0)
Hemoglobin: 14.1 g/dL (ref 13.0–17.0)
LYMPHS PCT: 21.6 % (ref 12.0–46.0)
Lymphs Abs: 1.4 10*3/uL (ref 0.7–4.0)
MCHC: 33.5 g/dL (ref 30.0–36.0)
MCV: 94.8 fl (ref 78.0–100.0)
Monocytes Absolute: 0.6 10*3/uL (ref 0.1–1.0)
Monocytes Relative: 9 % (ref 3.0–12.0)
NEUTROS ABS: 4.1 10*3/uL (ref 1.4–7.7)
Neutrophils Relative %: 62.5 % (ref 43.0–77.0)
PLATELETS: 290 10*3/uL (ref 150.0–400.0)
RBC: 4.43 Mil/uL (ref 4.22–5.81)
RDW: 12.8 % (ref 11.5–15.5)
WBC: 6.6 10*3/uL (ref 4.0–10.5)

## 2016-10-23 LAB — BASIC METABOLIC PANEL
BUN: 24 mg/dL — ABNORMAL HIGH (ref 6–23)
CALCIUM: 9.7 mg/dL (ref 8.4–10.5)
CO2: 28 meq/L (ref 19–32)
CREATININE: 1.04 mg/dL (ref 0.40–1.50)
Chloride: 104 mEq/L (ref 96–112)
GFR: 74.05 mL/min (ref >60.00)
Glucose, Bld: 106 mg/dL — ABNORMAL HIGH (ref 70–99)
Potassium: 4.4 mEq/L (ref 3.5–5.1)
Sodium: 139 mEq/L (ref 135–145)

## 2016-10-23 LAB — TSH: TSH: 4.3 u[IU]/mL (ref 0.35–4.50)

## 2016-10-23 LAB — LIPID PANEL
CHOL/HDL RATIO: 2
Cholesterol: 129 mg/dL (ref 0–200)
HDL: 57.1 mg/dL (ref >39.00)
LDL CALC: 61 mg/dL (ref 0–99)
NONHDL: 72.25
Triglycerides: 55 mg/dL (ref 0.0–149.0)
VLDL: 11 mg/dL (ref 0.0–40.0)

## 2016-10-23 LAB — PSA: PSA: 3.21 ng/mL (ref 0.10–4.00)

## 2016-10-29 NOTE — Assessment & Plan Note (Signed)
Claritin prn 

## 2016-11-05 DIAGNOSIS — H5203 Hypermetropia, bilateral: Secondary | ICD-10-CM | POA: Diagnosis not present

## 2016-11-05 DIAGNOSIS — H524 Presbyopia: Secondary | ICD-10-CM | POA: Diagnosis not present

## 2016-11-05 DIAGNOSIS — H1045 Other chronic allergic conjunctivitis: Secondary | ICD-10-CM | POA: Diagnosis not present

## 2016-11-05 DIAGNOSIS — H43813 Vitreous degeneration, bilateral: Secondary | ICD-10-CM | POA: Diagnosis not present

## 2016-11-05 DIAGNOSIS — H52223 Regular astigmatism, bilateral: Secondary | ICD-10-CM | POA: Diagnosis not present

## 2017-01-10 DIAGNOSIS — M1711 Unilateral primary osteoarthritis, right knee: Secondary | ICD-10-CM | POA: Diagnosis not present

## 2017-02-11 ENCOUNTER — Other Ambulatory Visit: Payer: Self-pay | Admitting: Internal Medicine

## 2017-03-08 ENCOUNTER — Ambulatory Visit (INDEPENDENT_AMBULATORY_CARE_PROVIDER_SITE_OTHER): Payer: Medicare Other | Admitting: Internal Medicine

## 2017-03-08 ENCOUNTER — Encounter: Payer: Self-pay | Admitting: Internal Medicine

## 2017-03-08 DIAGNOSIS — L723 Sebaceous cyst: Secondary | ICD-10-CM | POA: Diagnosis not present

## 2017-03-08 MED ORDER — DOXYCYCLINE HYCLATE 100 MG PO TABS: 100.0000 mg | ORAL_TABLET | Freq: Two times a day (BID) | ORAL | 1 refills | Status: DC

## 2017-03-08 NOTE — Assessment & Plan Note (Addendum)
Urol ref Dr Diona Fanti offered for a removal No need for I&D today Doxy x5 or 7 d

## 2017-03-08 NOTE — Progress Notes (Signed)
Subjective:  Patient ID: David Buck, male    DOB: 1940/10/07  Age: 76 y.o. MRN: [id number]  CC: No chief complaint on file.   HPI David Buck presents for a pimple on a penis - more red and tender. The lump has been there for a long time; occ a cream cheese like material is being squeezed out by the pt - every 6 weeks or so. No h/o STDs  Outpatient Medications Prior to Visit  Medication Sig Dispense Refill  . aspirin EC 81 MG tablet Take 81 mg by mouth daily.    . Cholecalciferol 2000 units TABS Take 1 tablet by mouth daily.    . Glucosamine HCl 1000 MG TABS Take 1 tablet by mouth daily.    . hydrochlorothiazide (MICROZIDE) 12.5 MG capsule Take 1 capsule (12.5 mg total) by mouth daily. 90 capsule 3  . levothyroxine (SYNTHROID, LEVOTHROID) 75 MCG tablet Take 1 tablet (75 mcg total) by mouth daily. Annual appt due in Oct must see provider for future refills 90 tablet 0  . loratadine (CLARITIN) 10 MG tablet Take 10 mg by mouth daily.     . Multiple Vitamin (MULTIVITAMIN) tablet Take 1 tablet by mouth daily.    . saw palmetto 160 MG capsule Take 160 mg by mouth daily.    . vitamin C (ASCORBIC ACID) 500 MG tablet Take 500 mg by mouth daily.     No facility-administered medications prior to visit.     ROS Review of Systems  Constitutional: Negative for appetite change, fatigue, fever and unexpected weight change.  HENT: Negative for congestion, nosebleeds, sneezing, sore throat and trouble swallowing.   Eyes: Negative for itching and visual disturbance.  Respiratory: Negative for cough.   Cardiovascular: Negative for chest pain, palpitations and leg swelling.  Gastrointestinal: Negative for abdominal distention, blood in stool, diarrhea and nausea.  Genitourinary: Negative for frequency and hematuria.  Musculoskeletal: Negative for back pain, gait problem, joint swelling and neck pain.  Skin: Negative for rash.  Neurological: Negative for dizziness, tremors, speech  difficulty and weakness.  Psychiatric/Behavioral: Negative for agitation, dysphoric mood and sleep disturbance. The patient is not nervous/anxious.     Objective:  BP 138/70   Pulse 66   Temp 98 F (36.7 C)   Wt 173 lb (78.5 kg)   SpO2 98%   BMI 22.21 kg/m   BP Readings from Last 3 Encounters:  03/08/17 138/70  10/22/16 136/74  05/03/16 124/68    Wt Readings from Last 3 Encounters:  03/08/17 173 lb (78.5 kg)  10/22/16 176 lb 1.3 oz (79.9 kg)  05/03/16 175 lb 9.6 oz (79.7 kg)    Physical Exam  Constitutional: No distress.  Skin: No rash noted. There is erythema. No pallor.  Psychiatric: He has a normal mood and affect.  6x7 mm eryth nodule on penile shaft, sensitive; no d/c  Lab Results  Component Value Date   WBC 6.6 10/23/2016   HGB 14.1 10/23/2016   HCT 42.0 10/23/2016   PLT 290.0 10/23/2016   GLUCOSE 106 (H) 10/23/2016   CHOL 129 10/23/2016   TRIG 55.0 10/23/2016   HDL 57.10 10/23/2016   LDLCALC 61 10/23/2016   ALT 15 10/23/2016   AST 17 10/23/2016   NA 139 10/23/2016   K 4.4 10/23/2016   CL 104 10/23/2016   CREATININE 1.04 10/23/2016   BUN 24 (H) 10/23/2016   CO2 28 10/23/2016   TSH 4.30 10/23/2016   PSA 3.21 10/23/2016   INR  1.04 01/06/2016   HGBA1C 5.8 10/26/2010    No results found.  Assessment & Plan:   There are no diagnoses linked to this encounter. I am having Mr. Yamada maintain his loratadine, Cholecalciferol, multivitamin, aspirin EC, Glucosamine HCl, vitamin C, saw palmetto, hydrochlorothiazide, and levothyroxine.  No orders of the defined types were placed in this encounter.    Follow-up: No Follow-up on file.  Walker Kehr, MD

## 2017-04-05 ENCOUNTER — Ambulatory Visit (INDEPENDENT_AMBULATORY_CARE_PROVIDER_SITE_OTHER): Payer: Medicare Other

## 2017-04-05 DIAGNOSIS — Z23 Encounter for immunization: Secondary | ICD-10-CM | POA: Diagnosis not present

## 2017-04-06 ENCOUNTER — Ambulatory Visit: Payer: Medicare Other

## 2017-04-16 ENCOUNTER — Encounter: Payer: Self-pay | Admitting: Physician Assistant

## 2017-04-16 ENCOUNTER — Ambulatory Visit (INDEPENDENT_AMBULATORY_CARE_PROVIDER_SITE_OTHER): Payer: Medicare Other | Admitting: Physician Assistant

## 2017-04-16 VITALS — BP 112/62 | HR 85 | Temp 98.7°F | Ht 74.5 in | Wt 172.4 lb

## 2017-04-16 DIAGNOSIS — J069 Acute upper respiratory infection, unspecified: Secondary | ICD-10-CM | POA: Diagnosis not present

## 2017-04-16 DIAGNOSIS — Z720 Tobacco use: Secondary | ICD-10-CM | POA: Diagnosis not present

## 2017-04-16 DIAGNOSIS — J449 Chronic obstructive pulmonary disease, unspecified: Secondary | ICD-10-CM

## 2017-04-16 MED ORDER — DOXYCYCLINE HYCLATE 100 MG PO TABS: 100.0000 mg | ORAL_TABLET | Freq: Two times a day (BID) | ORAL | 0 refills | Status: DC

## 2017-04-16 NOTE — Progress Notes (Signed)
David Buck is a 76 y.o. male here for a new problem.  History of Present Illness:   Chief Complaint  Patient presents with  . Acute Visit     HA and nasal discharge    HPI   Had a flu shot about two weeks ago and then developed "flu-like symptoms" about 1 week later per his report.  Has had sinus infections in the past. No sick contacts. Was taking some ASA to help with his symptoms. Takes Claritin daily at baseline, subject to seasonal allergies. No decongestants. Does have a history of PNA, was treated as an outpatient. Denies SOB, chills, fever, palpitations. Appetite is fine. No diarrhea. Sees Dr. Annamaria Boots for small lung nodules/RUL nodular scarring/COPD. He admits that he is not drinking much water.  Currently smoking about 1 PPD.  Past Medical History:  Diagnosis Date  . Allergy    rhinitis  . COPD (chronic obstructive pulmonary disease) ([place])   . History of hepatitis B    recovered  . Hypertension   . Hypothyroidism   . 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 Meniere's disease   . Osteoarthritis of right knee    Dr Percell Miller  . Pneumonia    hx of 2/17   . Posterior vitreous detachment 2011   R, body- Dr Baird Cancer  . Prostatitis   . Tinnitus      Social History   Social History  . Marital status: Married    Spouse name: N/A  . Number of children: N/A  . Years of education: N/A   Occupational History  . Not on file.   Social History Main Topics  . Smoking status: Current Every Day Smoker    Packs/day: 1.00    Years: 50.00    Types: Cigarettes  . Smokeless tobacco: Never Used  . Alcohol use Yes     Comment: occasional   . Drug use: No  . Sexual activity: Not on file   Other Topics Concern  . Not on file   Social History Narrative  . No narrative on file    Past Surgical History:  Procedure Laterality Date  . bilateral cataract surgery     . BRONCHOSCOPY  04-14-08   benign  . INGUINAL HERNIA REPAIR  2005   Dr Hassell Done- bilateral    . right knee menisc tear repair    . 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   Procedure: RIGHT TOTAL KNEE ARTHROPLASTY;  Surgeon: Gaynelle Arabian, MD;  Location: [facility];  Service: Orthopedics;  Laterality: Right;    Family History  Problem Relation Age of Onset  . Cancer Mother        ovarian and bowel  . Other Father        cerebral hemorrage  . Hypertension Other     Allergies  Allergen Reactions  . Cefdinir Rash    REACTION: Rash  . Cephalosporins     Other reaction(s): Other (See Comments) Other Reaction: cefloxin/ceftin type drug    Current Medications:   Current Outpatient Prescriptions:  .  aspirin EC 81 MG tablet, Take 81 mg by mouth daily., Disp: , Rfl:  .  Cholecalciferol 2000 units TABS, Take 1 tablet by mouth daily., Disp: , Rfl:  .  Glucosamine HCl 1000 MG TABS, Take 1 tablet by mouth daily., Disp: , Rfl:  .  hydrochlorothiazide (MICROZIDE) 12.5 MG capsule, Take 1 capsule (12.5 mg total) by mouth daily., Disp: 90 capsule, Rfl: 3 .  levothyroxine (  SYNTHROID, LEVOTHROID) 75 MCG tablet, Take 1 tablet (75 mcg total) by mouth daily. Annual appt due in Oct must see provider for future refills, Disp: 90 tablet, Rfl: 0 .  loratadine (CLARITIN) 10 MG tablet, Take 10 mg by mouth daily. , Disp: , Rfl:  .  Multiple Vitamin (MULTIVITAMIN) tablet, Take 1 tablet by mouth daily., Disp: , Rfl:  .  saw palmetto 160 MG capsule, Take 160 mg by mouth daily., Disp: , Rfl:  .  vitamin C (ASCORBIC ACID) 500 MG tablet, Take 500 mg by mouth daily., Disp: , Rfl:  .  doxycycline (VIBRA-TABS) 100 MG tablet, Take 1 tablet (100 mg total) by mouth 2 (two) times daily., Disp: 20 tablet, Rfl: 0   Review of Systems:   ROS  Negative unless otherwise specified per HPI  Vitals:   Vitals:   04/16/17 1404  BP: 112/62  Pulse: 85  Temp: 98.7 F (37.1 C)  SpO2: 95%  Weight: 172 lb 6.4 oz (78.2 kg)  Height: 6' 2.5" (1.892 m)     Body mass index is 21.84 kg/m.  Physical Exam:   Physical  Exam  Constitutional: He appears well-developed. He is cooperative.  Non-toxic appearance. He does not have a sickly appearance. He does not appear ill. No distress.  HENT:  Head: Normocephalic and atraumatic.  Right Ear: Tympanic membrane, external ear and ear canal normal. Tympanic membrane is not erythematous, not retracted and not bulging.  Left Ear: Tympanic membrane, external ear and ear canal normal. Tympanic membrane is not erythematous, not retracted and not bulging.  Nose: Mucosal edema and rhinorrhea present. Right sinus exhibits maxillary sinus tenderness. Right sinus exhibits no frontal sinus tenderness. Left sinus exhibits maxillary sinus tenderness. Left sinus exhibits no frontal sinus tenderness.  Mouth/Throat: Uvula is midline and mucous membranes are normal. Posterior oropharyngeal edema present. No posterior oropharyngeal erythema. Tonsils are 1+ on the right. Tonsils are 1+ on the left. No tonsillar exudate.  Eyes: Conjunctivae and lids are normal.  Neck: Trachea normal.  Cardiovascular: Normal rate, regular rhythm, S1 normal, S2 normal and normal heart sounds.   Pulmonary/Chest: Effort normal and breath sounds normal. He has no decreased breath sounds. He has no wheezes. He has no rhonchi. He has no rales.  Good air movement throughout bilateral lungs during exam  Lymphadenopathy:    He has no cervical adenopathy.  Neurological: He is alert.  Skin: Skin is warm, dry and intact.  Psychiatric: He has a normal mood and affect. His speech is normal and behavior is normal.  Nursing note and vitals reviewed.     Assessment and Plan:    [name] was seen today for acute visit.  Diagnoses and all orders for this visit:  Upper respiratory tract infection, unspecified type; COPD mixed type ([place]); Tobacco abuse No red flags on exam. Given COPD hx, ongoing heavy tobacco abuse, and duration of symptoms, will begin antibiotic treatment at this time. Briefly reinforced need for  smoking reduction/cessation -- patient without interest at this time. Reviewed warning signs and red flags. Follow-up if symptoms worsen or persist despite treatment.  Other orders -     doxycycline (VIBRA-TABS) 100 MG tablet; Take 1 tablet (100 mg total) by mouth 2 (two) times daily.    . Reviewed expectations re: course of current medical issues. . Discussed self-management of symptoms. . Outlined signs and symptoms indicating need for more acute intervention. . Patient verbalized understanding and all questions were answered. . See orders for this visit as  documented in the electronic medical record. . Patient received an After-Visit Summary.  CMA or LPN served as scribe during this visit. History, Physical, and Plan performed by medical provider. Documentation and orders reviewed and attested to.  Inda Coke, PA-C

## 2017-04-16 NOTE — Patient Instructions (Signed)
Start the oral doxycycline, take with food to prevent stomach upset.  Push fluids, urine should be clear.  Please use Flonase in both nostrils, twice daily in morning and night.  Follow-up if symptoms worsen or persist.  Please work on smoking cessation :)

## 2017-04-26 ENCOUNTER — Encounter: Payer: Self-pay | Admitting: Internal Medicine

## 2017-04-26 ENCOUNTER — Ambulatory Visit (INDEPENDENT_AMBULATORY_CARE_PROVIDER_SITE_OTHER): Payer: Medicare Other | Admitting: Internal Medicine

## 2017-04-26 VITALS — BP 136/82 | HR 76 | Temp 98.3°F | Ht 74.5 in | Wt 172.0 lb

## 2017-04-26 DIAGNOSIS — R634 Abnormal weight loss: Secondary | ICD-10-CM

## 2017-04-26 DIAGNOSIS — J449 Chronic obstructive pulmonary disease, unspecified: Secondary | ICD-10-CM

## 2017-04-26 DIAGNOSIS — E034 Atrophy of thyroid (acquired): Secondary | ICD-10-CM | POA: Diagnosis not present

## 2017-04-26 DIAGNOSIS — I10 Essential (primary) hypertension: Secondary | ICD-10-CM

## 2017-04-26 DIAGNOSIS — Z23 Encounter for immunization: Secondary | ICD-10-CM

## 2017-04-26 DIAGNOSIS — G459 Transient cerebral ischemic attack, unspecified: Secondary | ICD-10-CM | POA: Diagnosis not present

## 2017-04-26 MED ORDER — LEVOTHYROXINE SODIUM 75 MCG PO TABS: 75.0000 ug | ORAL_TABLET | Freq: Every day | ORAL | 2 refills | Status: DC

## 2017-04-26 NOTE — Assessment & Plan Note (Signed)
HCTZ 

## 2017-04-26 NOTE — Assessment & Plan Note (Signed)
Cont to smoke

## 2017-04-26 NOTE — Assessment & Plan Note (Signed)
On Rx 

## 2017-04-26 NOTE — Assessment & Plan Note (Signed)
Wt Readings from Last 3 Encounters:  04/26/17 172 lb (78 kg)  04/16/17 172 lb 6.4 oz (78.2 kg)  03/08/17 173 lb (78.5 kg)

## 2017-04-26 NOTE — Progress Notes (Signed)
Subjective:  Patient ID: David Buck, male    DOB: 1940/10/25  Age: 76 y.o. MRN: [id number]  CC: No chief complaint on file.   HPI David Buck presents for a 6 mo f/u: hypothyroidism, HTN, BPH. He had a recent URI - treated  Outpatient Medications Prior to Visit  Medication Sig Dispense Refill  . aspirin EC 81 MG tablet Take 81 mg by mouth daily.    . Cholecalciferol 2000 units TABS Take 1 tablet by mouth daily.    . Glucosamine HCl 1000 MG TABS Take 1 tablet by mouth daily.    . hydrochlorothiazide (MICROZIDE) 12.5 MG capsule Take 1 capsule (12.5 mg total) by mouth daily. 90 capsule 3  . levothyroxine (SYNTHROID, LEVOTHROID) 75 MCG tablet Take 1 tablet (75 mcg total) by mouth daily. Annual appt due in Oct must see provider for future refills 90 tablet 0  . loratadine (CLARITIN) 10 MG tablet Take 10 mg by mouth daily.     . Multiple Vitamin (MULTIVITAMIN) tablet Take 1 tablet by mouth daily.    . saw palmetto 160 MG capsule Take 160 mg by mouth daily.    . vitamin C (ASCORBIC ACID) 500 MG tablet Take 500 mg by mouth daily.    Marland Kitchen doxycycline (VIBRA-TABS) 100 MG tablet Take 1 tablet (100 mg total) by mouth 2 (two) times daily. (Patient not taking: Reported on 04/26/2017) 20 tablet 0   No facility-administered medications prior to visit.     ROS Review of Systems  Constitutional: Negative for appetite change, fatigue and unexpected weight change.  HENT: Negative for congestion, nosebleeds, sneezing, sore throat and trouble swallowing.   Eyes: Negative for itching and visual disturbance.  Respiratory: Negative for cough.   Cardiovascular: Negative for chest pain, palpitations and leg swelling.  Gastrointestinal: Negative for abdominal distention, blood in stool, diarrhea and nausea.  Genitourinary: Negative for frequency and hematuria.  Musculoskeletal: Negative for back pain, gait problem, joint swelling and neck pain.  Skin: Negative for rash.  Neurological: Negative for  dizziness, tremors, speech difficulty and weakness.  Psychiatric/Behavioral: Negative for agitation, dysphoric mood and sleep disturbance. The patient is not nervous/anxious.     Objective:  BP 136/82 (BP Location: Left Arm, Patient Position: Sitting, Cuff Size: Normal)   Pulse 76   Temp 98.3 F (36.8 C) (Oral)   Ht 6' 2.5" (1.892 m)   Wt 172 lb (78 kg)   SpO2 98%   BMI 21.79 kg/m   BP Readings from Last 3 Encounters:  04/26/17 136/82  04/16/17 112/62  03/08/17 138/70    Wt Readings from Last 3 Encounters:  04/26/17 172 lb (78 kg)  04/16/17 172 lb 6.4 oz (78.2 kg)  03/08/17 173 lb (78.5 kg)    Physical Exam  Constitutional: He is oriented to person, place, and time. He appears well-developed. No distress.  NAD  HENT:  Mouth/Throat: Oropharynx is clear and moist.  Eyes: Pupils are equal, round, and reactive to light. Conjunctivae are normal.  Neck: Normal range of motion. No JVD present. No thyromegaly present.  Cardiovascular: Normal rate, regular rhythm, normal heart sounds and intact distal pulses.  Exam reveals no gallop and no friction rub.   No murmur heard. Pulmonary/Chest: Effort normal and breath sounds normal. No respiratory distress. He has no wheezes. He has no rales. He exhibits no tenderness.  Abdominal: Soft. Bowel sounds are normal. He exhibits no distension and no mass. There is no tenderness. There is no rebound and no guarding.  Musculoskeletal: Normal range of motion. He exhibits no edema or tenderness.  Lymphadenopathy:    He has no cervical adenopathy.  Neurological: He is alert and oriented to person, place, and time. He has normal reflexes. No cranial nerve deficit. He exhibits normal muscle tone. He displays a negative Romberg sign. Coordination and gait normal.  Skin: Skin is warm and dry. No rash noted.  Psychiatric: He has a normal mood and affect. His behavior is normal. Judgment and thought content normal.    Lab Results  Component Value  Date   WBC 6.6 10/23/2016   HGB 14.1 10/23/2016   HCT 42.0 10/23/2016   PLT 290.0 10/23/2016   GLUCOSE 106 (H) 10/23/2016   CHOL 129 10/23/2016   TRIG 55.0 10/23/2016   HDL 57.10 10/23/2016   LDLCALC 61 10/23/2016   ALT 15 10/23/2016   AST 17 10/23/2016   NA 139 10/23/2016   K 4.4 10/23/2016   CL 104 10/23/2016   CREATININE 1.04 10/23/2016   BUN 24 (H) 10/23/2016   CO2 28 10/23/2016   TSH 4.30 10/23/2016   PSA 3.21 10/23/2016   INR 1.04 01/06/2016   HGBA1C 5.8 10/26/2010    No results found.  Assessment & Plan:   There are no diagnoses linked to this encounter. I have discontinued David Buck's doxycycline. I am also having him maintain his loratadine, Cholecalciferol, multivitamin, aspirin EC, Glucosamine HCl, vitamin C, saw palmetto, hydrochlorothiazide, and levothyroxine.  No orders of the defined types were placed in this encounter.    Follow-up: No Follow-up on file.  Walker Kehr, MD

## 2017-04-26 NOTE — Assessment & Plan Note (Signed)
Cont w/ASA

## 2017-04-30 NOTE — Addendum Note (Signed)
Addended by: Karren Cobble on: 04/30/2017 02:44 PM   Modules accepted: Orders

## 2017-07-05 DIAGNOSIS — D2262 Melanocytic nevi of left upper limb, including shoulder: Secondary | ICD-10-CM | POA: Diagnosis not present

## 2017-07-05 DIAGNOSIS — D1801 Hemangioma of skin and subcutaneous tissue: Secondary | ICD-10-CM | POA: Diagnosis not present

## 2017-07-05 DIAGNOSIS — B353 Tinea pedis: Secondary | ICD-10-CM | POA: Diagnosis not present

## 2017-07-05 DIAGNOSIS — L814 Other melanin hyperpigmentation: Secondary | ICD-10-CM | POA: Diagnosis not present

## 2017-07-05 DIAGNOSIS — L821 Other seborrheic keratosis: Secondary | ICD-10-CM | POA: Diagnosis not present

## 2017-08-01 ENCOUNTER — Ambulatory Visit (INDEPENDENT_AMBULATORY_CARE_PROVIDER_SITE_OTHER)
Admission: RE | Admit: 2017-08-01 | Discharge: 2017-08-01 | Disposition: A | Discharge status: Home / Self Care | Payer: Medicare Other | Source: Ambulatory Visit | Attending: Internal Medicine | Admitting: Internal Medicine

## 2017-08-01 ENCOUNTER — Encounter: Payer: Self-pay | Admitting: Internal Medicine

## 2017-08-01 ENCOUNTER — Ambulatory Visit (INDEPENDENT_AMBULATORY_CARE_PROVIDER_SITE_OTHER): Payer: Medicare Other | Admitting: Internal Medicine

## 2017-08-01 VITALS — BP 108/66 | HR 79 | Ht 74.0 in | Wt 172.8 lb

## 2017-08-01 DIAGNOSIS — J3089 Other allergic rhinitis: Secondary | ICD-10-CM | POA: Diagnosis not present

## 2017-08-01 DIAGNOSIS — J302 Other seasonal allergic rhinitis: Secondary | ICD-10-CM

## 2017-08-01 DIAGNOSIS — R911 Solitary pulmonary nodule: Secondary | ICD-10-CM | POA: Diagnosis not present

## 2017-08-01 DIAGNOSIS — Z720 Tobacco use: Secondary | ICD-10-CM

## 2017-08-01 DIAGNOSIS — J449 Chronic obstructive pulmonary disease, unspecified: Secondary | ICD-10-CM | POA: Diagnosis not present

## 2017-08-01 MED ORDER — AZELASTINE-FLUTICASONE 137-50 MCG/ACT NA SUSP: 1.0000 | Freq: Every day | NASAL | 0 refills | Status: DC

## 2017-08-01 NOTE — Progress Notes (Signed)
Subjective:    Patient ID: David Buck, male    DOB: 10-Aug-1940, 77 y.o.   MRN: [id number]    M 1 PPD smoker followed for small lung nodules/ RUL nodular scarring/ benign bronchoscopy 04/14/08- neg [employer]. 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  Emphysema pattern developing in flow volume loop   ------------------------------------------------------------------------------------  05/03/2016-77 year old male one pack per day smoker followed for small lung nodules/right upper lobe nodular scarring/benign bronchoscopy 04/14/2008-negative [employer] FOLLOWS FOR: pt states he is doing well today, no complaints.    He stopped smoking for 2 weeks at time of right TKR but started back. Smoking discussed and cessation encouraged again. Feels well. No routine cough or phlegm. CT chest 09/21/2015 low-dose screening IMPRESSION: 1. Lung-RADS Category 2, benign appearance or behavior. Continue annual screening with low-dose chest CT without contrast in 12 months. 2. Coronary artery calcification. Myocardial Perfusion Imaging 12/23/2015-EF 56%, low risk study.  08/01/17- 77 year old male one pack per day smoker followed for small lung nodules/right upper lobe nodular scarring/benign bronchoscopy 38/03/1750-[employer] [employer], complicated by vasomotor rhinitis,  ----Pt states he has been doing good. Denies any complaints or concerns. He expects to stop smoking in the next year because they are moving to [place], where smoking is banned. Denies coughing but complains of postnasal drip for which he has loratadine-not being taken now. Denies adenopathy, blood, night sweats, pain.  ROS-see HPI  + = positive Constitutional:   No-   weight loss, night sweats, fevers, chills, fatigue, lassitude. HEENT:   No-  headaches, difficulty swallowing, tooth/dental problems, sore throat,       No-  sneezing, itching, ear ache, nasal congestion, +post nasal drip,  CV:  No-    chest pain, orthopnea, PND, swelling in lower extremities, anasarca, dizziness, palpitations Resp: No-   shortness of breath with exertion or at rest.              No-   productive cough,  No- non-productive cough,  No- coughing up of blood.              No-   change in color of mucus.  No- wheezing.   Skin: No-   rash or lesions. GI:  No-   heartburn, indigestion, abdominal pain, nausea, vomiting,  GU:  MS:  No-   joint pain or swelling.  Neuro-     nothing unusual Psych:  No- change in mood or affect. No depression or anxiety.  No memory loss.  Objective:   Physical Exam General- Alert, Oriented, Affect-appropriate, Distress- none acute. tall slender man Skin- rash-none, lesions- none, excoriation- none Lymphadenopathy- none Head- atraumatic            Eyes- Gross vision intact, PERRLA, conjunctivae clear secretions            Ears- Hearing, canals normal            Nose- Clear, No- Septal dev, +mucus postnasal drip, polyps, erosion, perforation             Throat- Mallampati II , mucosa clear , drainage- none, tonsils- atrophic  dentures Neck- flexible , trachea midline, no stridor , thyroid nl, carotid no bruit Chest - symmetrical excursion , unlabored           Heart/CV- RRR , no murmur , no gallop  , no rub, nl s1 s2                           -  JVD- none , edema- none, stasis changes- none, varices- none           Lung- Clear, unlabored, wheeze- none, cough- none , dullness-none, rub- none           Chest wall-  Abd-  Br/ Gen/ Rectal- Not done, not indicated Extrem- cyanosis- none, clubbing, none, atrophy- none, strength- nl Neuro-+ mild head bobbing tremor    Assessment & Plan:

## 2017-08-01 NOTE — Patient Instructions (Signed)
Sample Dymista nasal spray    Try 1-2 puffs each nostril once or twice daily as needed for drainage.    If it helps, lwt me know and we can send in a script for Astelin nasal spray which is likely to work as well, but be cheaper.  Ok to use loratadine. Ok to use otc Flonase nasal spray instead of the prescription, if you prefer.  Order- CXR     Dx lung nodules, tobacco user

## 2017-08-04 NOTE — Assessment & Plan Note (Signed)
He minimizes obstructive symptoms. Plan-CXR.  Strongly encouraged to quit smoking now.

## 2017-08-04 NOTE — Assessment & Plan Note (Signed)
Postnasal drip is most likely irritant from tobacco smoke/nonallergic.  We can give him a sample of Dymista nasal spray.  Because of cost, prescription Astelin may supplement his loratidine if needed.

## 2017-08-27 ENCOUNTER — Encounter: Payer: Self-pay | Admitting: Physician Assistant

## 2017-08-27 ENCOUNTER — Ambulatory Visit (INDEPENDENT_AMBULATORY_CARE_PROVIDER_SITE_OTHER): Payer: Medicare Other | Admitting: Physician Assistant

## 2017-08-27 VITALS — BP 120/66 | HR 69 | Temp 98.7°F | Ht 74.0 in | Wt 171.4 lb

## 2017-08-27 DIAGNOSIS — R300 Dysuria: Secondary | ICD-10-CM | POA: Diagnosis not present

## 2017-08-27 DIAGNOSIS — R52 Pain, unspecified: Secondary | ICD-10-CM

## 2017-08-27 DIAGNOSIS — R6883 Chills (without fever): Secondary | ICD-10-CM

## 2017-08-27 LAB — POCT URINALYSIS DIPSTICK
Bilirubin, UA: NEGATIVE
Glucose, UA: NEGATIVE
KETONES UA: NEGATIVE
LEUKOCYTES UA: NEGATIVE
NITRITE UA: NEGATIVE
PH UA: 6.5 (ref 5.0–8.0)
PROTEIN UA: NEGATIVE
SPEC GRAV UA: 1.02 (ref 1.010–1.025)
UROBILINOGEN UA: 0.2 U/dL

## 2017-08-27 LAB — POC INFLUENZA A&B (BINAX/QUICKVUE)
INFLUENZA A, POC: NEGATIVE
INFLUENZA B, POC: NEGATIVE

## 2017-08-27 NOTE — Progress Notes (Signed)
David Buck is a 77 y.o. male here for a new problem.  I acted as a Education administrator for [employer], PA-C Anselmo Pickler, LPN  History of Present Illness:   Chief Complaint  Patient presents with  . Chills  . Generalized Body Aches    Other  This is a new (Pt c/o chills and body aches for thepast 10 days) problem. The problem has been unchanged. Associated symptoms include chills. Pertinent negatives include no fatigue, headaches, nausea, neck pain, sore throat, vomiting or weakness. Associated symptoms comments: C/o chills, body aches, slight non-productive cough. Diarrhea off and on past 10 days none today.. Nothing aggravates the symptoms. He has tried NSAIDs and rest (Took aspirin today) for the symptoms. The treatment provided mild relief.   Symptoms started 10 days ago. Also having some intermittent gas pain that is resolved with BM. Eating same foods as he usual does, nothing new or unusual. Doesn't feel like he is drinking enough water. Hasn't traveled recently. Appetite is fair. Does not have diarrhea with each BM. He does have a history of COPD and is a current smoker, last saw his pulmonologist, Dr. Annamaria Boots to follow-up on his lung nodule in Feb 2019. Routine CXR was done at that time and it did not show any changes.   He does note 1 episode, this morning of dysuria and this has essentially resolved.   Past Medical History:  Diagnosis Date  . Allergy    rhinitis  . COPD (chronic obstructive pulmonary disease) ([place])   . History of hepatitis B    recovered  . Hypertension   . Hypothyroidism   . 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 Meniere's disease   . Osteoarthritis of right knee    Dr Percell Miller  . Pneumonia    hx of 2/17   . Posterior vitreous detachment 2011   R, body- Dr Baird Cancer  . Prostatitis   . Tinnitus      Social History   Socioeconomic History  . Marital status: Married    Spouse name: Not on file  . Number of children: Not on file  .  Years of education: Not on file  . Highest education level: Not on file  Social Needs  . Financial resource strain: Not on file  . Food insecurity - worry: Not on file  . Food insecurity - inability: Not on file  . Transportation needs - medical: Not on file  . Transportation needs - non-medical: Not on file  Occupational History  . Not on file  Tobacco Use  . Smoking status: Current Every Day Smoker    Packs/day: 1.00    Years: 50.00    Pack years: 50.00    Types: Cigarettes  . Smokeless tobacco: Never Used  Substance and Sexual Activity  . Alcohol use: Yes    Comment: occasional   . Drug use: No  . Sexual activity: Not on file  Other Topics Concern  . Not on file  Social History Narrative  . Not on file    Past Surgical History:  Procedure Laterality Date  . bilateral cataract surgery     . BRONCHOSCOPY  04-14-08   benign  . INGUINAL HERNIA REPAIR  2005   Dr Hassell Done- bilateral   . right knee menisc tear repair    . 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   Procedure: RIGHT TOTAL KNEE ARTHROPLASTY;  Surgeon: Gaynelle Arabian, MD;  Location: [facility];  Service: Orthopedics;  Laterality:  Right;    Family History  Problem Relation Age of Onset  . Cancer Mother        ovarian and bowel  . Other Father        cerebral hemorrage  . Hypertension Other     Allergies  Allergen Reactions  . Cefdinir Rash    REACTION: Rash  . Cephalosporins     Other reaction(s): Other (See Comments) Other Reaction: cefloxin/ceftin type drug    Current Medications:   Current Outpatient Medications:  .  aspirin EC 81 MG tablet, Take 81 mg by mouth daily., Disp: , Rfl:  .  Azelastine-Fluticasone (DYMISTA) 137-50 MCG/ACT SUSP, Place 1 puff into the nose daily., Disp: 1 Bottle, Rfl: 0 .  Cholecalciferol 2000 units TABS, Take 1 tablet by mouth daily., Disp: , Rfl:  .  Glucosamine HCl 1000 MG TABS, Take 1 tablet by mouth daily., Disp: , Rfl:  .  hydrochlorothiazide (MICROZIDE) 12.5 MG  capsule, Take 1 capsule (12.5 mg total) by mouth daily., Disp: 90 capsule, Rfl: 3 .  levothyroxine (SYNTHROID, LEVOTHROID) 75 MCG tablet, Take 1 tablet (75 mcg total) by mouth daily before breakfast., Disp: 90 tablet, Rfl: 2 .  loratadine (CLARITIN) 10 MG tablet, Take 10 mg by mouth daily. , Disp: , Rfl:  .  Multiple Vitamin (MULTIVITAMIN) tablet, Take 1 tablet by mouth daily., Disp: , Rfl:  .  saw palmetto 160 MG capsule, Take 160 mg by mouth daily., Disp: , Rfl:  .  vitamin C (ASCORBIC ACID) 500 MG tablet, Take 500 mg by mouth daily., Disp: , Rfl:    Review of Systems:   Review of Systems  Constitutional: Positive for chills. Negative for fatigue.  HENT: Negative for sore throat.   Gastrointestinal: Negative for nausea and vomiting.  Musculoskeletal: Negative for neck pain.  Neurological: Negative for weakness and headaches.    Vitals:   Vitals:   08/27/17 1442  BP: 120/66  Pulse: 69  Temp: 98.7 F (37.1 C)  TempSrc: Oral  SpO2: 96%  Weight: 171 lb 6.1 oz (77.7 kg)  Height: 6\' 2"  (1.88 m)     Body mass index is 22 kg/m.  Physical Exam:   Physical Exam  Constitutional: He appears well-developed. He is cooperative.  Non-toxic appearance. He does not have a sickly appearance. He does not appear ill. No distress.  HENT:  Head: Normocephalic and atraumatic.  Right Ear: Tympanic membrane, external ear and ear canal normal. Tympanic membrane is not erythematous, not retracted and not bulging.  Left Ear: Tympanic membrane, external ear and ear canal normal. Tympanic membrane is not erythematous, not retracted and not bulging.  Nose: Nose normal. Right sinus exhibits no maxillary sinus tenderness and no frontal sinus tenderness. Left sinus exhibits no maxillary sinus tenderness and no frontal sinus tenderness.  Mouth/Throat: Uvula is midline and mucous membranes are normal. Posterior oropharyngeal erythema present. No posterior oropharyngeal edema. Tonsils are 0 on the right.  Tonsils are 0 on the left. No tonsillar exudate.  Eyes: Conjunctivae and lids are normal.  Neck: Trachea normal.  Cardiovascular: Normal rate, regular rhythm, S1 normal, S2 normal and normal heart sounds.  Pulmonary/Chest: Effort normal and breath sounds normal. He has no decreased breath sounds. He has no wheezes. He has no rhonchi. He has no rales.  Abdominal: Normal appearance and bowel sounds are normal. There is no tenderness. There is no rigidity, no rebound, no guarding and no CVA tenderness.  Lymphadenopathy:    He has no cervical adenopathy.  Neurological: He is alert.  Skin: Skin is warm, dry and intact.  Psychiatric: He has a normal mood and affect. His speech is normal and behavior is normal.  Nursing note and vitals reviewed.   Results for orders placed or performed in visit on 08/27/17  POCT urinalysis dipstick  Result Value Ref Range   Color, UA Yellow    Clarity, UA Clear    Glucose, UA Negative    Bilirubin, UA Negative    Ketones, UA Negative    Spec Grav, UA 1.020 1.010 - 1.025   Blood, UA 2+(Moderate)    pH, UA 6.5 5.0 - 8.0   Protein, UA Negative    Urobilinogen, UA 0.2 0.2 or 1.0 E.U./dL   Nitrite, UA Negative    Leukocytes, UA Negative Negative   Appearance     Odor     Influenza A negative.   Assessment and Plan:    Jaasiel was seen today for chills and generalized body aches.  Diagnoses and all orders for this visit:  Generalized body aches, chills Negative flu. Vitals stable. Recent chest x-ray without any abnormalities. No indication at this time for chest x-ray. I will get labs for further evaluation of symptoms. I discussed with him that I suspect that this is a viral illness and I recommended that he push fluids. I also advised patient to follow-up if symptoms worsen or persist despite treatment. -     POC Influenza A&B(BINAX/QUICKVUE) -     CBC with Differential/Platelet -     Comprehensive metabolic panel -     POCT urinalysis  dipstick  Dysuria He reports that this was only a one-time occurrence. He does have blood in his urine. However based on reading past urinalyses, it is not unusual for him. I will recommend that he see urology if he has not already done so for this. I'm going to send this off for culture. -     CBC with Differential/Platelet -     Comprehensive metabolic panel -     POCT urinalysis dipstick    . Reviewed expectations re: course of current medical issues. . Discussed self-management of symptoms. . Outlined signs and symptoms indicating need for more acute intervention. . Patient verbalized understanding and all questions were answered. . See orders for this visit as documented in the electronic medical record. . Patient received an After-Visit Summary.  CMA or LPN served as scribe during this visit. History, Physical, and Plan performed by medical provider. Documentation and orders reviewed and attested to.  Inda Coke, PA-C

## 2017-08-27 NOTE — Patient Instructions (Signed)
Push fluids.  It was great to see you!  You have a viral infection.  We will contact you with your lab and urine results.  Push fluids and get plenty of rest. Please return if you are not improving as expected, or if you have high fevers (>101.5) or difficulty swallowing or worsening productive cough.  Call clinic with questions.  I hope you start feeling better soon!

## 2017-08-28 ENCOUNTER — Ambulatory Visit: Payer: Medicare Other | Admitting: Family

## 2017-08-28 ENCOUNTER — Ambulatory Visit: Payer: Medicare Other | Admitting: Physician Assistant

## 2017-08-28 LAB — CBC WITH DIFFERENTIAL/PLATELET
BASOS ABS: 0.1 10*3/uL (ref 0.0–0.1)
Basophils Relative: 0.6 % (ref 0.0–3.0)
Eosinophils Absolute: 0.3 10*3/uL (ref 0.0–0.7)
Eosinophils Relative: 2.3 % (ref 0.0–5.0)
HCT: 40.3 % (ref 39.0–52.0)
Hemoglobin: 13.3 g/dL (ref 13.0–17.0)
LYMPHS ABS: 1.1 10*3/uL (ref 0.7–4.0)
Lymphocytes Relative: 8.6 % — ABNORMAL LOW (ref 12.0–46.0)
MCHC: 33.1 g/dL (ref 30.0–36.0)
MCV: 95.4 fl (ref 78.0–100.0)
MONOS PCT: 9.3 % (ref 3.0–12.0)
Monocytes Absolute: 1.2 10*3/uL — ABNORMAL HIGH (ref 0.1–1.0)
NEUTROS ABS: 9.9 10*3/uL — AB (ref 1.4–7.7)
NEUTROS PCT: 79.2 % — AB (ref 43.0–77.0)
PLATELETS: 314 10*3/uL (ref 150.0–400.0)
RBC: 4.23 Mil/uL (ref 4.22–5.81)
RDW: 12.8 % (ref 11.5–15.5)
WBC: 12.5 10*3/uL — ABNORMAL HIGH (ref 4.0–10.5)

## 2017-08-28 LAB — URINE CULTURE
MICRO NUMBER:: 90281779
Result:: NO GROWTH
SPECIMEN QUALITY:: ADEQUATE

## 2017-08-28 LAB — COMPREHENSIVE METABOLIC PANEL
ALT: 20 U/L (ref 0–53)
AST: 17 U/L (ref 0–37)
Albumin: 3.9 g/dL (ref 3.5–5.2)
Alkaline Phosphatase: 72 U/L (ref 39–117)
BUN: 24 mg/dL — ABNORMAL HIGH (ref 6–23)
CO2: 31 meq/L (ref 19–32)
Calcium: 9.9 mg/dL (ref 8.4–10.5)
Chloride: 100 mEq/L (ref 96–112)
Creatinine, Ser: 1.07 mg/dL (ref 0.40–1.50)
GFR: 71.5 mL/min (ref >60.00)
GLUCOSE: 86 mg/dL (ref 70–99)
POTASSIUM: 4 meq/L (ref 3.5–5.1)
Sodium: 138 mEq/L (ref 135–145)
Total Bilirubin: 0.5 mg/dL (ref 0.2–1.2)
Total Protein: 6.8 g/dL (ref 6.0–8.3)

## 2017-08-29 ENCOUNTER — Other Ambulatory Visit: Payer: Self-pay | Admitting: *Deleted

## 2017-08-29 ENCOUNTER — Telehealth: Payer: Self-pay | Admitting: Internal Medicine

## 2017-08-29 MED ORDER — DOXYCYCLINE HYCLATE 100 MG PO TABS: 100.0000 mg | ORAL_TABLET | Freq: Two times a day (BID) | ORAL | 0 refills | Status: DC

## 2017-08-29 NOTE — Telephone Encounter (Signed)
Pt's spouse, [name] is aware of recommendations and voiced her understanding. [name] states pt was negative for flu yesterday. Nothing further is needed.   Will route to CY as an [place].

## 2017-08-29 NOTE — Telephone Encounter (Signed)
Patients wife called back and states that he is still not feeling well. He is weak and his body is sore. Two days ago he went to [facility] for this and they prescribed him doxycycline. Patient has not started the medication. Patients wife states that [facility] is sending him to have a chest xray on 3.8.19. Patients wife is wanting to know if Dr. Annamaria Boots would like to see the patient or if he is ok with what the primary care office is doing. CY please advise, thanks.    Current Outpatient Medications on File Prior to Visit  Medication Sig Dispense Refill  . aspirin EC 81 MG tablet Take 81 mg by mouth daily.    . Azelastine-Fluticasone (DYMISTA) 137-50 MCG/ACT SUSP Place 1 puff into the nose daily. 1 Bottle 0  . Cholecalciferol 2000 units TABS Take 1 tablet by mouth daily.    . Glucosamine HCl 1000 MG TABS Take 1 tablet by mouth daily.    . hydrochlorothiazide (MICROZIDE) 12.5 MG capsule Take 1 capsule (12.5 mg total) by mouth daily. 90 capsule 3  . levothyroxine (SYNTHROID, LEVOTHROID) 75 MCG tablet Take 1 tablet (75 mcg total) by mouth daily before breakfast. 90 tablet 2  . loratadine (CLARITIN) 10 MG tablet Take 10 mg by mouth daily.     . Multiple Vitamin (MULTIVITAMIN) tablet Take 1 tablet by mouth daily.    . saw palmetto 160 MG capsule Take 160 mg by mouth daily.    . vitamin C (ASCORBIC ACID) 500 MG tablet Take 500 mg by mouth daily.     No current facility-administered medications on file prior to visit.     Allergies  Allergen Reactions  . Cefdinir Rash    REACTION: Rash  . Cephalosporins     Other reaction(s): Other (See Comments) Other Reaction: cefloxin/ceftin type drug

## 2017-08-29 NOTE — Telephone Encounter (Signed)
Called patient and patients wife, unable to reach both. Left message to give [place] a call back.

## 2017-08-29 NOTE — Telephone Encounter (Signed)
This is likely a viral syndrome. Doxycycline would work if any antibiotic can, but unless there is bacterial infection it won't do anything. If this has only been going on a few days, then body aches suggest this could be flu. We are still seeing new flu cases. For flu I would offer Tamiflu 75 mg, # 10, 1 twice daily. Agree with CXR.  If illness has been going on more than a week, and the CXR is clear, then treatment is mainly fluids, rest, and otc symptom meds like Theraflu, Tylenol Cold and Flu, or etc. As needed.

## 2017-08-30 ENCOUNTER — Ambulatory Visit (INDEPENDENT_AMBULATORY_CARE_PROVIDER_SITE_OTHER): Payer: Medicare Other

## 2017-08-30 ENCOUNTER — Encounter: Payer: Self-pay | Admitting: Physician Assistant

## 2017-08-30 ENCOUNTER — Ambulatory Visit (INDEPENDENT_AMBULATORY_CARE_PROVIDER_SITE_OTHER): Payer: Medicare Other | Admitting: Physician Assistant

## 2017-08-30 VITALS — BP 140/80 | HR 89 | Temp 98.5°F | Ht 74.0 in | Wt 171.4 lb

## 2017-08-30 DIAGNOSIS — R0602 Shortness of breath: Secondary | ICD-10-CM

## 2017-08-30 DIAGNOSIS — J449 Chronic obstructive pulmonary disease, unspecified: Secondary | ICD-10-CM | POA: Diagnosis not present

## 2017-08-30 DIAGNOSIS — J069 Acute upper respiratory infection, unspecified: Secondary | ICD-10-CM | POA: Diagnosis not present

## 2017-08-30 NOTE — Progress Notes (Signed)
David Buck is a 77 y.o. male is here to discuss: SOB and not feeling any better.  I acted as a Education administrator for [employer], PA-C Anselmo Pickler, LPN  History of Present Illness:   Chief Complaint  Patient presents with  . Shortness of Breath  . Fatigue    Shortness of Breath  This is a new problem. Episode onset: Pt states he does not have SOB just fatigue. Pertinent negatives include no fever, headaches, sore throat or wheezing. Associated symptoms comments: Chills. Treatments tried: Started antibiotic yesterday.   He is taking doxycycline, has had 3 doses. Has not had improvement, feels the same.  Of note, his wife called his pulmonologist yesterday, concerned about his symptoms. Per Dr. Janee Morn note, "This is likely a viral syndrome. Doxycycline would work if any antibiotic can, but unless there is bacterial infection it won't do anything. If this has only been going on a few days, then body aches suggest this could be flu. We are still seeing new flu cases. For flu I would offer Tamiflu 75 mg, # 10, 1 twice daily. Agree with CXR. If illness has been going on more than a week, and the CXR is clear, then treatment is mainly fluids, rest, and otc symptom meds like Theraflu, Tylenol Cold and Flu, or etc. As needed."  Patient was tested for the flu in our office on 08/27/17 and this was negative. Patient reports to me that his wife is worried that he has walking PNA, which he had in the past and was successfully treated with levaquin. She is not present today.     There are no preventive care reminders to display for this patient.  Past Medical History:  Diagnosis Date  . Allergy    rhinitis  . COPD (chronic obstructive pulmonary disease) ([place])   . History of hepatitis B    recovered  . Hypertension   . Hypothyroidism   . 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 Meniere's disease   . Osteoarthritis of right knee    Dr Percell Miller  . Pneumonia    hx of 2/17     . Posterior vitreous detachment 2011   R, body- Dr Baird Cancer  . Prostatitis   . Tinnitus      Social History   Socioeconomic History  . Marital status: Married    Spouse name: Not on file  . Number of children: Not on file  . Years of education: Not on file  . Highest education level: Not on file  Social Needs  . Financial resource strain: Not on file  . Food insecurity - worry: Not on file  . Food insecurity - inability: Not on file  . Transportation needs - medical: Not on file  . Transportation needs - non-medical: Not on file  Occupational History  . Not on file  Tobacco Use  . Smoking status: Current Every Day Smoker    Packs/day: 1.00    Years: 50.00    Pack years: 50.00    Types: Cigarettes  . Smokeless tobacco: Never Used  Substance and Sexual Activity  . Alcohol use: Yes    Comment: occasional   . Drug use: No  . Sexual activity: Not on file  Other Topics Concern  . Not on file  Social History Narrative  . Not on file    Past Surgical History:  Procedure Laterality Date  . bilateral cataract surgery     . BRONCHOSCOPY  04-14-08  benign  . INGUINAL HERNIA REPAIR  2005   Dr Hassell Done- bilateral   . right knee menisc tear repair    . 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   Procedure: RIGHT TOTAL KNEE ARTHROPLASTY;  Surgeon: Gaynelle Arabian, MD;  Location: [facility];  Service: Orthopedics;  Laterality: Right;    Family History  Problem Relation Age of Onset  . Cancer Mother        ovarian and bowel  . Other Father        cerebral hemorrage  . Hypertension Other     PMHx, SurgHx, SocialHx, FamHx, Medications, and Allergies were reviewed in the Visit Navigator and updated as appropriate.   Patient Active Problem List   Diagnosis Date Noted  . Sebaceous cyst 03/08/2017  . Preop exam for internal medicine 12/13/2015  . Microhematuria 09/12/2015  . Fatigue 08/01/2015  . UTI (urinary tract infection) 08/01/2015  . Acute upper respiratory infection  08/01/2015  . CAP (community acquired pneumonia) 08/01/2015  . Libido, decreased 08/01/2015  . TIA (transient ischemic attack) 12/03/2014  . Vertigo 10/20/2014  . 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 Elevated PSA 11/30/2013  . Osteoarthritis of [place] joint of thumb 11/13/2013  . OA (osteoarthritis) of knee 11/13/2013  . Thumb pain 11/12/2013  . Knee pain 11/12/2013  . Essential hypertension 10/17/2012  . Elevated blood pressure 11/05/2011  . Well adult exam 11/03/2010  . 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 Lung nodule 01/30/2010  . SORE THROAT 06/24/2009  . TREMOR, ESSENTIAL 02/07/2009  . 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 ARM PAIN 02/07/2009  . Seasonal and perennial allergic rhinitis 07/13/2008  . ABNORMAL GLUCOSE NEC 07/13/2008  . SINUSITIS, ACUTE 02/03/2008  . TOBACCO USE DISORDER/SMOKER-SMOKING CESSATION DISCUSSED 10/02/2007  . WEIGHT LOSS, ABNORMAL 10/02/2007  . COPD mixed type ([place]) 08/04/2007  . ACQUIRED CYST OF KIDNEY 08/04/2007  . Hypothyroidism 07/24/2007  . RBBB 07/23/2007  . 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 OSTEOARTHRITIS 07/23/2007  . WHEEZING 07/23/2007  . Nonspecific (abnormal) findings on radiological and other examination of body structure 07/23/2007  . CHEST XRAY, ABNORMAL 07/23/2007    Social History   Tobacco Use  . Smoking status: Current Every Day Smoker    Packs/day: 1.00    Years: 50.00    Pack years: 50.00    Types: Cigarettes  . Smokeless tobacco: Never Used  Substance Use Topics  . Alcohol use: Yes    Comment: occasional   . Drug use: No    Current Medications and Allergies:    Current Outpatient Medications:  .  aspirin EC 81 MG tablet, Take 81 mg by mouth daily., Disp: , Rfl:  .  Azelastine-Fluticasone (DYMISTA) 137-50 MCG/ACT SUSP, Place 1 puff into the nose daily., Disp: 1 Bottle, Rfl: 0 .  Cholecalciferol 2000 units TABS, Take 1 tablet by mouth daily., Disp: , Rfl:  .  doxycycline (VIBRA-TABS) 100 MG tablet, Take 1 tablet (100 mg total)  by mouth 2 (two) times daily., Disp: 20 tablet, Rfl: 0 .  Glucosamine HCl 1000 MG TABS, Take 1 tablet by mouth daily., Disp: , Rfl:  .  hydrochlorothiazide (MICROZIDE) 12.5 MG capsule, Take 1 capsule (12.5 mg total) by mouth daily., Disp: 90 capsule, Rfl: 3 .  levothyroxine (SYNTHROID, LEVOTHROID) 75 MCG tablet, Take 1 tablet (75 mcg total) by mouth daily before breakfast., Disp: 90 tablet, Rfl: 2 .  loratadine (CLARITIN) 10 MG tablet, Take 10 mg by mouth daily. , Disp: , Rfl:  .  Multiple Vitamin (MULTIVITAMIN) tablet, Take  1 tablet by mouth daily., Disp: , Rfl:  .  saw palmetto 160 MG capsule, Take 160 mg by mouth daily., Disp: , Rfl:  .  vitamin C (ASCORBIC ACID) 500 MG tablet, Take 500 mg by mouth daily., Disp: , Rfl:    Allergies  Allergen Reactions  . Cefdinir Rash    REACTION: Rash  . Cephalosporins     Other reaction(s): Other (See Comments) Other Reaction: cefloxin/ceftin type drug    Review of Systems   Review of Systems  Constitutional: Positive for malaise/fatigue. Negative for chills and fever.  HENT: Negative for sore throat.   Respiratory: Positive for cough. Negative for shortness of breath and wheezing.   Neurological: Negative for headaches.    Vitals:   Vitals:   08/30/17 1517  BP: 140/80  Pulse: 89  Temp: 98.5 F (36.9 C)  TempSrc: Oral  SpO2: 98%  Weight: 171 lb 6.1 oz (77.7 kg)  Height: 6\' 2"  (1.88 m)     Body mass index is 22 kg/m.   Physical Exam:    Physical Exam  Constitutional: He appears well-developed. He is cooperative.  Non-toxic appearance. He does not have a sickly appearance. He does not appear ill. No distress.  HENT:  Head: Normocephalic and atraumatic.  Right Ear: Tympanic membrane, external ear and ear canal normal. Tympanic membrane is not erythematous, not retracted and not bulging.  Left Ear: Tympanic membrane, external ear and ear canal normal. Tympanic membrane is not erythematous, not retracted and not bulging.  Nose:  Mucosal edema and rhinorrhea present. Right sinus exhibits no maxillary sinus tenderness and no frontal sinus tenderness. Left sinus exhibits no maxillary sinus tenderness and no frontal sinus tenderness.  Mouth/Throat: Uvula is midline and mucous membranes are normal. Posterior oropharyngeal erythema present. No posterior oropharyngeal edema. Tonsils are 1+ on the right. Tonsils are 1+ on the left. No tonsillar exudate.  Eyes: Conjunctivae and lids are normal.  Neck: Trachea normal.  Cardiovascular: Normal rate, regular rhythm, S1 normal, S2 normal, normal heart sounds and normal pulses.  No LE edema  Pulmonary/Chest: Effort normal. He has decreased breath sounds in the right lower field and the left lower field. He has no wheezes. He has no rhonchi. He has no rales.  No crackles/wheezes  Lymphadenopathy:    He has no cervical adenopathy.  Neurological: He is alert. GCS eye subscore is 4. GCS verbal subscore is 5. GCS motor subscore is 6.  Skin: Skin is warm, dry and intact.  Psychiatric: He has a normal mood and affect. His speech is normal and behavior is normal.  Nursing note and vitals reviewed.  Narrative    CLINICAL DATA: Shortness of breath and fatigue. Smoker.  EXAM: CHEST - 2 VIEW  COMPARISON: Chest x-rays dated 08/01/2017 and 08/01/2015  FINDINGS: Heart size and mediastinal contours are normal. Lungs are hyperexpanded. Chronic bronchitic changes noted centrally. Coarse interstitial markings again noted bilaterally indicating chronic interstitial lung disease. No confluent opacity to suggest a developing pneumonia. No pleural effusion or pneumothorax. Osseous structures about the chest are unremarkable.  IMPRESSION: 1. No active cardiopulmonary disease. No evidence of pneumonia or pulmonary edema. 2. Hyperexpanded lungs indicating COPD. Associated chronic bronchitic changes and chronic interstitial lung disease bilaterally.   Electronically Signed By: Franki Cabot M.D. On: 08/30/2017 15:59      Assessment and Plan:    David Buck was seen today for shortness of breath and fatigue.  Diagnoses and all orders for this visit:  Upper respiratory tract infection,  unspecified type -     DG Chest 2 View   Chest xray negative for PNA. No red flags on exam.  Continue Doxycycline per orders. Discussed taking medications as prescribed. Reviewed return precautions including worsening fever, SOB, worsening cough or other concerns. Push fluids and rest. I recommend that patient follow-up if symptoms worsen or persist despite treatment x 7-10 days, sooner if needed.  If symptoms persist, consider follow-up with Dr. Annamaria Boots.  . Reviewed expectations re: course of current medical issues. . Discussed self-management of symptoms. . Outlined signs and symptoms indicating need for more acute intervention. . Patient verbalized understanding and all questions were answered. . See orders for this visit as documented in the electronic medical record. . Patient received an After Visit Summary.  CMA or LPN served as scribe during this visit. History, Physical, and Plan performed by medical provider. Documentation and orders reviewed and attested to.  Inda Coke, PA-C [place] [place] 08/30/2017  Follow-up: No Follow-up on file.

## 2017-09-06 ENCOUNTER — Ambulatory Visit: Payer: Medicare Other | Admitting: Physician Assistant

## 2017-09-27 ENCOUNTER — Ambulatory Visit: Payer: Self-pay | Admitting: *Deleted

## 2017-09-27 NOTE — Telephone Encounter (Signed)
Pt's wife reports pt with generalized weakness,"no energy,"  poor endurance x 1 month. Pt seen 08/27/17 for "viral infection,"  returned ov 08/29/17 and placed on doxycycline for unresolved symptoms. CXR at that time negative. H/O COPD.  Pt's wife states "just has not improved at all." Reports non-productive cough; wife states "this is usual" as he is still smoking. States weight loss but unsure of total loss. Denies fever, congestion, no one sided weakness. States no other symptoms other than fatigue and weakness. Pt wishes to see Dr. Alain Marion only. Prior to NT, agent routed note re: next available appt. pt may be able to be fit in to schedule. Care advise given.  Pt's #    [phone] Reason for Disposition . [1] MODERATE weakness (i.e., interferes with work, school, normal activities) AND [2] persists > 3 days  Answer Assessment - Initial Assessment Questions 1. DESCRIPTION: "Describe how you are feeling."     Generalized weakness, poor endurance,fatigue, weight loss 2. SEVERITY: "How bad is it?"  "Can you stand and walk?"   - MILD - Feels weak or tired, but does not interfere with work, school or normal activities   - [place] to stand and walk; weakness interferes with work, school, or normal activities   - SEVERE - Unable to stand or walk     Moderate 3. ONSET:  "When did the weakness begin?"     Beginning of March 4. CAUSE: "What do you think is causing the weakness?"     Unsure 5. MEDICINES: "Have you recently started a new medicine or had a change in the amount of a medicine?"     no 6. OTHER SYMPTOMS: "Do you have any other symptoms?" (e.g., chest pain, fever, cough, SOB, vomiting, diarrhea, bleeding)     Non-productive cough, "smoking"  Protocols used: WEAKNESS (GENERALIZED) AND FATIGUE-A-AH

## 2017-10-03 NOTE — Telephone Encounter (Signed)
Can pt be seen tomorrow?

## 2017-10-03 NOTE — Telephone Encounter (Signed)
Pt states he is better and refused appt

## 2017-10-03 NOTE — Telephone Encounter (Signed)
noted 

## 2017-10-10 IMAGING — NM NM MISC PROCEDURE
6 series · 36 of 36 positions shown · non-contrast
Comparison: none

[Series 1: wbr_r-proj_st rest · 6.51mm/px · 6 of 64 frames shown]
[frame 6/64]
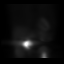
[frame 16/64]
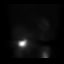
[frame 27/64]
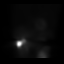
[frame 38/64]
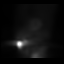
[frame 48/64]
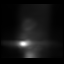
[frame 59/64]
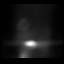

[Series 1: rest · 6.51mm/px · 6 of 64 frames shown]
[frame 6/64]
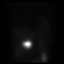
[frame 16/64]
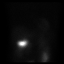
[frame 27/64]
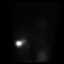
[frame 38/64]
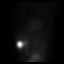
[frame 48/64]
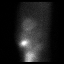
[frame 59/64]
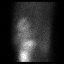

[Series 2: stress · 6.51mm/px · 6 of 64 frames shown (1 of 2)]
[frame 6/64]
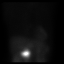
[frame 16/64]
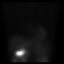
[frame 27/64]
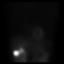
[frame 38/64]
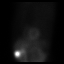
[frame 48/64]
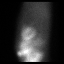
[frame 59/64]
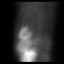

[Series 2: stress · 6.51mm/px · 6 of 512 frames shown (2 of 2)]
[frame 43/512]
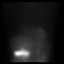
[frame 128/512]
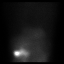
[frame 214/512]
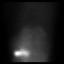
[frame 299/512]
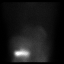
[frame 384/512]
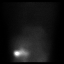
[frame 470/512]
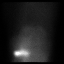

[Series 2: wbr_s-proj_st stress · 6.51mm/px · 6 of 64 frames shown (1 of 2)]
[frame 6/64]
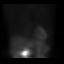
[frame 16/64]
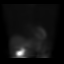
[frame 27/64]
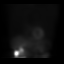
[frame 38/64]
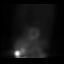
[frame 48/64]
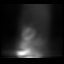
[frame 59/64]
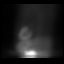

[Series 2: wbr_s-proj_st stress · 6.51mm/px · 6 of 512 frames shown (2 of 2)]
[frame 43/512]
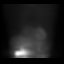
[frame 128/512]
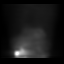
[frame 214/512]
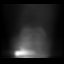
[frame 299/512]
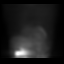
[frame 384/512]
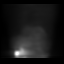
[frame 470/512]
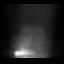

[36 of 36 positions shown; findings below may reference images not displayed]

Canned report from images found in remote index.

Refer to host system for actual result text.

## 2017-10-25 ENCOUNTER — Ambulatory Visit: Payer: Medicare Other | Admitting: Internal Medicine

## 2017-10-28 ENCOUNTER — Encounter: Payer: Self-pay | Admitting: Internal Medicine

## 2017-10-28 ENCOUNTER — Ambulatory Visit (INDEPENDENT_AMBULATORY_CARE_PROVIDER_SITE_OTHER): Payer: Medicare Other | Admitting: Internal Medicine

## 2017-10-28 VITALS — BP 116/62 | HR 80 | Temp 98.1°F | Ht 74.0 in | Wt 169.0 lb

## 2017-10-28 DIAGNOSIS — N320 Bladder-neck obstruction: Secondary | ICD-10-CM

## 2017-10-28 DIAGNOSIS — J449 Chronic obstructive pulmonary disease, unspecified: Secondary | ICD-10-CM | POA: Diagnosis not present

## 2017-10-28 DIAGNOSIS — E034 Atrophy of thyroid (acquired): Secondary | ICD-10-CM

## 2017-10-28 MED ORDER — ZOSTER VAC RECOMB ADJUVANTED 50 MCG/0.5ML IM SUSR: 0.5000 mL | Freq: Once | INTRAMUSCULAR | 1 refills | Status: AC

## 2017-10-28 NOTE — Patient Instructions (Signed)
MC w/Jill

## 2017-10-28 NOTE — Progress Notes (Signed)
Subjective:  Patient ID: David Buck, male    DOB: 1940-08-19  Age: 77 y.o. MRN: [id number]  CC: No chief complaint on file.   HPI David Buck presents for allergies, HTN, hypothyroidism f/u  Outpatient Medications Prior to Visit  Medication Sig Dispense Refill  . aspirin EC 81 MG tablet Take 81 mg by mouth daily.    . Azelastine-Fluticasone (DYMISTA) 137-50 MCG/ACT SUSP Place 1 puff into the nose daily. 1 Bottle 0  . Cholecalciferol 2000 units TABS Take 1 tablet by mouth daily.    . Glucosamine HCl 1000 MG TABS Take 1 tablet by mouth daily.    . hydrochlorothiazide (MICROZIDE) 12.5 MG capsule Take 1 capsule (12.5 mg total) by mouth daily. 90 capsule 3  . levothyroxine (SYNTHROID, LEVOTHROID) 75 MCG tablet Take 1 tablet (75 mcg total) by mouth daily before breakfast. 90 tablet 2  . loratadine (CLARITIN) 10 MG tablet Take 10 mg by mouth daily.     . Multiple Vitamin (MULTIVITAMIN) tablet Take 1 tablet by mouth daily.    . saw palmetto 160 MG capsule Take 160 mg by mouth daily.    . vitamin C (ASCORBIC ACID) 500 MG tablet Take 500 mg by mouth daily.    Marland Kitchen doxycycline (VIBRA-TABS) 100 MG tablet Take 1 tablet (100 mg total) by mouth 2 (two) times daily. 20 tablet 0   No facility-administered medications prior to visit.     ROS Review of Systems  Constitutional: Positive for unexpected weight change. Negative for appetite change and fatigue.  HENT: Positive for congestion and rhinorrhea. Negative for nosebleeds, sneezing, sore throat and trouble swallowing.   Eyes: Negative for itching and visual disturbance.  Respiratory: Negative for cough.   Cardiovascular: Negative for chest pain, palpitations and leg swelling.  Gastrointestinal: Negative for abdominal distention, blood in stool, diarrhea and nausea.  Genitourinary: Negative for frequency and hematuria.  Musculoskeletal: Positive for arthralgias. Negative for back pain, gait problem, joint swelling and neck pain.  Skin:  Negative for rash.  Neurological: Negative for dizziness, tremors, speech difficulty and weakness.  Psychiatric/Behavioral: Negative for agitation, dysphoric mood and sleep disturbance. The patient is not nervous/anxious.     Objective:  BP 116/62 (BP Location: Left Arm, Patient Position: Sitting, Cuff Size: Large)   Pulse 80   Temp 98.1 F (36.7 C) (Oral)   Ht 6\' 2"  (1.88 m)   Wt 169 lb (76.7 kg)   SpO2 99%   BMI 21.70 kg/m   BP Readings from Last 3 Encounters:  10/28/17 116/62  08/30/17 140/80  08/27/17 120/66    Wt Readings from Last 3 Encounters:  10/28/17 169 lb (76.7 kg)  08/30/17 171 lb 6.1 oz (77.7 kg)  08/27/17 171 lb 6.1 oz (77.7 kg)    Physical Exam  Constitutional: He is oriented to person, place, and time. He appears well-developed. No distress.  NAD  HENT:  Mouth/Throat: Oropharynx is clear and moist.  Eyes: Pupils are equal, round, and reactive to light. Conjunctivae are normal.  Neck: Normal range of motion. No JVD present. No thyromegaly present.  Cardiovascular: Normal rate, regular rhythm, normal heart sounds and intact distal pulses. Exam reveals no gallop and no friction rub.  No murmur heard. Pulmonary/Chest: Effort normal and breath sounds normal. No respiratory distress. He has no wheezes. He has no rales. He exhibits no tenderness.  Abdominal: Soft. Bowel sounds are normal. He exhibits no distension and no mass. There is no tenderness. There is no rebound and no  guarding.  Genitourinary: Rectal exam shows guaiac negative stool.  Musculoskeletal: Normal range of motion. He exhibits no edema or tenderness.  Lymphadenopathy:    He has no cervical adenopathy.  Neurological: He is alert and oriented to person, place, and time. He has normal reflexes. No cranial nerve deficit. He exhibits normal muscle tone. He displays a negative Romberg sign. Coordination and gait normal.  Skin: Skin is warm and dry. No rash noted.  Psychiatric: He has a normal mood  and affect. His behavior is normal. Judgment and thought content normal.   Prostate 2+  Lab Results  Component Value Date   WBC 12.5 (H) 08/27/2017   HGB 13.3 08/27/2017   HCT 40.3 08/27/2017   PLT 314.0 08/27/2017   GLUCOSE 86 08/27/2017   CHOL 129 10/23/2016   TRIG 55.0 10/23/2016   HDL 57.10 10/23/2016   LDLCALC 61 10/23/2016   ALT 20 08/27/2017   AST 17 08/27/2017   NA 138 08/27/2017   K 4.0 08/27/2017   CL 100 08/27/2017   CREATININE 1.07 08/27/2017   BUN 24 (H) 08/27/2017   CO2 31 08/27/2017   TSH 4.30 10/23/2016   PSA 3.21 10/23/2016   INR 1.04 01/06/2016   HGBA1C 5.8 10/26/2010    Dg Chest 2 View  Result Date: 08/02/2017 CLINICAL DATA:  Routine. No chest complaints. History of hypertension and smoking. EXAM: CHEST  2 VIEW COMPARISON:  CT, 09/21/2015.  Radiographs, 08/01/2015. FINDINGS: Cardiac silhouette is normal in size and configuration. No mediastinal or hilar masses. There is no evidence of adenopathy. Lungs are hyperexpanded with prominent bronchovascular markings. Mild reticular scarring is noted in the anterior lung bases. No evidence of pneumonia or pulmonary edema. No pleural effusion or pneumothorax. Skeletal structures are intact. IMPRESSION: 1. No acute cardiopulmonary disease. 2. COPD. Electronically Signed   By: Lajean Manes M.D.   On: 08/02/2017 09:05    Assessment & Plan:   There are no diagnoses linked to this encounter. I have discontinued David Buck "Terry"'s doxycycline. I am also having him maintain his loratadine, Cholecalciferol, multivitamin, aspirin EC, Glucosamine HCl, vitamin C, saw palmetto, hydrochlorothiazide, levothyroxine, and Azelastine-Fluticasone.  No orders of the defined types were placed in this encounter.    Follow-up: No follow-ups on file.  Walker Kehr, MD

## 2017-10-28 NOTE — Assessment & Plan Note (Signed)
F/u w/Dr Young 

## 2017-10-28 NOTE — Assessment & Plan Note (Signed)
Levothroid 

## 2017-10-31 ENCOUNTER — Other Ambulatory Visit (INDEPENDENT_AMBULATORY_CARE_PROVIDER_SITE_OTHER): Payer: Medicare Other

## 2017-10-31 DIAGNOSIS — E034 Atrophy of thyroid (acquired): Secondary | ICD-10-CM

## 2017-10-31 DIAGNOSIS — N320 Bladder-neck obstruction: Secondary | ICD-10-CM

## 2017-10-31 DIAGNOSIS — J449 Chronic obstructive pulmonary disease, unspecified: Secondary | ICD-10-CM

## 2017-10-31 LAB — CBC WITH DIFFERENTIAL/PLATELET
BASOS ABS: 0.1 10*3/uL (ref 0.0–0.1)
Basophils Relative: 1.1 % (ref 0.0–3.0)
Eosinophils Absolute: 0.6 10*3/uL (ref 0.0–0.7)
Eosinophils Relative: 8.7 % — ABNORMAL HIGH (ref 0.0–5.0)
HEMATOCRIT: 40 % (ref 39.0–52.0)
HEMOGLOBIN: 13.5 g/dL (ref 13.0–17.0)
LYMPHS PCT: 23.7 % (ref 12.0–46.0)
Lymphs Abs: 1.6 10*3/uL (ref 0.7–4.0)
MCHC: 33.8 g/dL (ref 30.0–36.0)
MCV: 94.7 fl (ref 78.0–100.0)
MONOS PCT: 8.8 % (ref 3.0–12.0)
Monocytes Absolute: 0.6 10*3/uL (ref 0.1–1.0)
NEUTROS ABS: 3.8 10*3/uL (ref 1.4–7.7)
Neutrophils Relative %: 57.7 % (ref 43.0–77.0)
PLATELETS: 285 10*3/uL (ref 150.0–400.0)
RBC: 4.22 Mil/uL (ref 4.22–5.81)
RDW: 13.9 % (ref 11.5–15.5)
WBC: 6.6 10*3/uL (ref 4.0–10.5)

## 2017-10-31 LAB — BASIC METABOLIC PANEL
BUN: 22 mg/dL (ref 6–23)
CHLORIDE: 101 meq/L (ref 96–112)
CO2: 30 meq/L (ref 19–32)
CREATININE: 0.98 mg/dL (ref 0.40–1.50)
Calcium: 9.5 mg/dL (ref 8.4–10.5)
GFR: 79.09 mL/min (ref >60.00)
GLUCOSE: 107 mg/dL — AB (ref 70–99)
Potassium: 4.2 mEq/L (ref 3.5–5.1)
Sodium: 138 mEq/L (ref 135–145)

## 2017-10-31 LAB — HEPATIC FUNCTION PANEL
ALT: 16 U/L (ref 0–53)
AST: 18 U/L (ref 0–37)
Albumin: 4.3 g/dL (ref 3.5–5.2)
Alkaline Phosphatase: 63 U/L (ref 39–117)
BILIRUBIN DIRECT: 0.2 mg/dL (ref 0.0–0.3)
TOTAL PROTEIN: 6.8 g/dL (ref 6.0–8.3)
Total Bilirubin: 0.7 mg/dL (ref 0.2–1.2)

## 2017-10-31 LAB — LIPID PANEL
CHOL/HDL RATIO: 2
CHOLESTEROL: 128 mg/dL (ref 0–200)
HDL: 56.9 mg/dL (ref >39.00)
LDL Cholesterol: 63 mg/dL (ref 0–99)
NonHDL: 71.5
Triglycerides: 45 mg/dL (ref 0.0–149.0)
VLDL: 9 mg/dL (ref 0.0–40.0)

## 2017-10-31 LAB — TSH: TSH: 4.59 u[IU]/mL — ABNORMAL HIGH (ref 0.35–4.50)

## 2017-10-31 LAB — PSA: PSA: 2.84 ng/mL (ref 0.10–4.00)

## 2017-11-01 ENCOUNTER — Other Ambulatory Visit: Payer: Self-pay | Admitting: Internal Medicine

## 2017-11-01 LAB — MUMPS ANTIBODY, IGG

## 2017-11-01 LAB — RUBEOLA ANTIBODY IGG: RUBEOLA IGG: 259 [AU]/ml

## 2017-11-01 MED ORDER — LEVOTHYROXINE SODIUM 88 MCG PO TABS: 88.0000 ug | ORAL_TABLET | Freq: Every day | ORAL | 3 refills | Status: DC

## 2017-11-15 DIAGNOSIS — D485 Neoplasm of uncertain behavior of skin: Secondary | ICD-10-CM | POA: Diagnosis not present

## 2017-11-15 DIAGNOSIS — L821 Other seborrheic keratosis: Secondary | ICD-10-CM | POA: Diagnosis not present

## 2017-11-26 ENCOUNTER — Encounter: Payer: Self-pay | Admitting: Family

## 2017-11-26 ENCOUNTER — Encounter

## 2017-11-26 ENCOUNTER — Ambulatory Visit (INDEPENDENT_AMBULATORY_CARE_PROVIDER_SITE_OTHER): Payer: Medicare Other | Admitting: Family

## 2017-11-26 ENCOUNTER — Other Ambulatory Visit (INDEPENDENT_AMBULATORY_CARE_PROVIDER_SITE_OTHER): Payer: Medicare Other

## 2017-11-26 VITALS — BP 138/78 | HR 80 | Temp 97.6°F | Ht 74.0 in | Wt 167.1 lb

## 2017-11-26 DIAGNOSIS — Z720 Tobacco use: Secondary | ICD-10-CM | POA: Diagnosis not present

## 2017-11-26 DIAGNOSIS — I10 Essential (primary) hypertension: Secondary | ICD-10-CM | POA: Diagnosis not present

## 2017-11-26 DIAGNOSIS — R202 Paresthesia of skin: Secondary | ICD-10-CM

## 2017-11-26 DIAGNOSIS — R5383 Other fatigue: Secondary | ICD-10-CM

## 2017-11-26 DIAGNOSIS — R200 Anesthesia of skin: Secondary | ICD-10-CM | POA: Diagnosis not present

## 2017-11-26 LAB — TSH: TSH: 2.5 u[IU]/mL (ref 0.35–4.50)

## 2017-11-26 LAB — VITAMIN B12: Vitamin B-12: 453 pg/mL (ref 211–911)

## 2017-11-26 MED ORDER — HYDROCHLOROTHIAZIDE 25 MG PO TABS: 25.0000 mg | ORAL_TABLET | Freq: Every day | ORAL | 1 refills | Status: DC

## 2017-11-26 MED ORDER — ESCITALOPRAM OXALATE 10 MG PO TABS: 10.0000 mg | ORAL_TABLET | Freq: Every day | ORAL | 2 refills | Status: DC

## 2017-11-26 NOTE — Progress Notes (Signed)
David Buck is a 77 y.o. male with the following history as recorded in [employer]:  Patient Active Problem List   Diagnosis Date Noted  . Sebaceous cyst 03/08/2017  . Preop exam for internal medicine 12/13/2015  . Microhematuria 09/12/2015  . Fatigue 08/01/2015  . UTI (urinary tract infection) 08/01/2015  . Acute upper respiratory infection 08/01/2015  . CAP (community acquired pneumonia) 08/01/2015  . Libido, decreased 08/01/2015  . TIA (transient ischemic attack) 12/03/2014  . Vertigo 10/20/2014  . 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 Elevated PSA 11/30/2013  . Osteoarthritis of [name] joint of thumb 11/13/2013  . OA (osteoarthritis) of knee 11/13/2013  . Thumb pain 11/12/2013  . Knee pain 11/12/2013  . Essential hypertension 10/17/2012  . Elevated blood pressure 11/05/2011  . Well adult exam 11/03/2010  . 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 Lung nodule 01/30/2010  . SORE THROAT 06/24/2009  . TREMOR, ESSENTIAL 02/07/2009  . 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 ARM PAIN 02/07/2009  . Seasonal and perennial allergic rhinitis 07/13/2008  . ABNORMAL GLUCOSE NEC 07/13/2008  . SINUSITIS, ACUTE 02/03/2008  . TOBACCO USE DISORDER/SMOKER-SMOKING CESSATION DISCUSSED 10/02/2007  . WEIGHT LOSS, ABNORMAL 10/02/2007  . COPD mixed type ([place]) 08/04/2007  . ACQUIRED CYST OF KIDNEY 08/04/2007  . Hypothyroidism 07/24/2007  . RBBB 07/23/2007  . 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 OSTEOARTHRITIS 07/23/2007  . WHEEZING 07/23/2007  . Nonspecific (abnormal) findings on radiological and other examination of body structure 07/23/2007  . CHEST XRAY, ABNORMAL 07/23/2007    Current Outpatient Medications  Medication Sig Dispense Refill  . aspirin EC 81 MG tablet Take 81 mg by mouth daily.    . Azelastine-Fluticasone (DYMISTA) 137-50 MCG/ACT SUSP Place 1 puff into the nose daily. 1 Bottle 0  . Cholecalciferol 2000 units TABS Take 1 tablet by mouth daily.    . Glucosamine HCl 1000 MG TABS Take 1 tablet by  mouth daily.    . hydrochlorothiazide (MICROZIDE) 12.5 MG capsule Take 1 capsule (12.5 mg total) by mouth daily. 90 capsule 3  . levothyroxine (SYNTHROID, LEVOTHROID) 88 MCG tablet Take 1 tablet (88 mcg total) by mouth daily. 90 tablet 3  . loratadine (CLARITIN) 10 MG tablet Take 10 mg by mouth daily.     . Multiple Vitamin (MULTIVITAMIN) tablet Take 1 tablet by mouth daily.    . saw palmetto 160 MG capsule Take 160 mg by mouth daily.    . vitamin C (ASCORBIC ACID) 500 MG tablet Take 500 mg by mouth daily.    Marland Kitchen escitalopram (LEXAPRO) 10 MG tablet Take 1 tablet (10 mg total) by mouth daily. 30 tablet 2  . hydrochlorothiazide (HYDRODIURIL) 25 MG tablet Take 1 tablet (25 mg total) by mouth daily. 90 tablet 1   No current facility-administered medications for this visit.     Allergies: Cefdinir and Cephalosporins  Past Medical History:  Diagnosis Date  . Allergy    rhinitis  . COPD (chronic obstructive pulmonary disease) ([place])   . History of hepatitis B    recovered  . Hypertension   . Hypothyroidism   . 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 Meniere's disease   . Osteoarthritis of right knee    Dr Percell Miller  . Pneumonia    hx of 2/17   . Posterior vitreous detachment 2011   R, body- Dr Baird [facility]  . Prostatitis   . Tinnitus     Past Surgical History:  Procedure Laterality Date  . bilateral cataract surgery     .  BRONCHOSCOPY  04-14-08   benign  . INGUINAL HERNIA REPAIR  2005   Dr Hassell Done- bilateral   . right knee menisc tear repair    . 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   Procedure: RIGHT TOTAL KNEE ARTHROPLASTY;  Surgeon: Gaynelle Arabian, MD;  Location: [facility];  Service: Orthopedics;  Laterality: Right;    Family History  Problem Relation Age of Onset  . Cancer Mother        ovarian and bowel  . Other Father        cerebral hemorrage  . Hypertension Other     Social History   Tobacco Use  . Smoking status: Current Every Day Smoker    Packs/day: 1.00     Years: 50.00    Pack years: 50.00    Types: Cigarettes  . Smokeless tobacco: Never Used  Substance Use Topics  . Alcohol use: Yes    Comment: occasional     Subjective:  Patient presents with numerous concerns today; accompanied by his wife and daughter who help provide history;  Concerns for 6 month history of fatigue- "just don't feel as good as I think I should." Wife and daughter are in the room today; wife endorses loud snoring; patient notes he feels he is getting 6-7 hours per night. He defers having a sleep study today;  He has concerns about his blood pressure being more elevated in the past 4 days; admits that anxiety is higher facing a big move towards the end of the year; Denies any chest pain, shortness of breath, blurred vision or headache. Wife feels like he is internalizing quite a bit of stress related to upcoming move and wonders if this could be affecting his blood pressure; both wife and daughter would like patient to consider daily anti-anxiety medication until move completed; Has been losing weight unexpectedly- feels like he has been a little more active recently; + smoker; last CT done in 2017 for lung cancer screen; up to date on colonoscopy.  Vitals:   11/26/17 1504  BP: 138/78  Pulse: 80  Temp: 97.6 F (36.4 C)  TempSrc: Oral  SpO2: 96%  Weight: 167 lb 1.3 oz (75.8 kg)  Height: 6\' 2"  (1.88 m)    General: Well developed, well nourished, in no acute distress  Skin : Warm and dry.  Head: Normocephalic and atraumatic  Eyes: Sclera and conjunctiva clear; pupils round and reactive to light; extraocular movements intact  Ears: External normal; canals clear; tympanic membranes normal  Oropharynx: Pink, supple. No suspicious lesions  Neck: Supple without thyromegaly, adenopathy  Lungs: Respirations unlabored; clear to auscultation bilaterally without wheeze, rales, rhonchi  CVS exam: normal rate and regular rhythm.  Neurologic: Alert and oriented; speech  intact; face symmetrical; moves all extremities well; CNII-XII intact without focal deficit   Assessment:  1. Other fatigue   2. Tobacco abuse   3. Numbness and tingling   4. Hypertension, unspecified type     Plan:  1. ? Related to anxiety regarding upcoming move; patient agrees to trial of Lexapro 10 mg daily; risks and benefits of medication discussed; he defers sleep study at this time; check TSH since Synthroid was recently adjusted; 2. Update lung cancer CT screen; 3. Check B12 level;   4. Update EKG today- no acute changes seen compared to previous EKG in 2017; stress test was done in 2017 and normal; increase HCTZ to 25 mg daily;   Return in about 1 month (around 12/26/2017) for with Dr. Alain Marion or  me.  Orders Placed This Encounter  Procedures  . TSH    Standing Status:   Future    Number of Occurrences:   1    Standing Expiration Date:   11/26/2018  . B12    Standing Status:   Future    Number of Occurrences:   1    Standing Expiration Date:   11/26/2018  . Ambulatory Referral for Lung Cancer Scre    Referral Priority:   Routine    Referral Type:   Consultation    Referral Reason:   Specialty Services Required    Number of Visits Requested:   1  . EKG 12-Lead    Requested Prescriptions   Signed Prescriptions Disp Refills  . escitalopram (LEXAPRO) 10 MG tablet 30 tablet 2    Sig: Take 1 tablet (10 mg total) by mouth daily.  . hydrochlorothiazide (HYDRODIURIL) 25 MG tablet 90 tablet 1    Sig: Take 1 tablet (25 mg total) by mouth daily.

## 2017-12-02 ENCOUNTER — Emergency Department (HOSPITAL_COMMUNITY): Payer: Medicare Other

## 2017-12-02 ENCOUNTER — Encounter (HOSPITAL_COMMUNITY): Payer: Self-pay | Admitting: Emergency Medicine

## 2017-12-02 ENCOUNTER — Observation Stay (HOSPITAL_COMMUNITY)
Admission: EM | Admit: 2017-12-02 | Discharge: 2017-12-04 | Disposition: A | Discharge status: Home / Self Care | Payer: Medicare Other | Attending: Family Medicine | Admitting: Family Medicine

## 2017-12-02 ENCOUNTER — Other Ambulatory Visit: Payer: Self-pay

## 2017-12-02 DIAGNOSIS — Y93C9 Activity, other involving computer technology and electronic devices: Secondary | ICD-10-CM | POA: Insufficient documentation

## 2017-12-02 DIAGNOSIS — S0990XA Unspecified injury of head, initial encounter: Secondary | ICD-10-CM | POA: Diagnosis not present

## 2017-12-02 DIAGNOSIS — Y92009 Unspecified place in unspecified non-institutional (private) residence as the place of occurrence of the external cause: Secondary | ICD-10-CM | POA: Diagnosis not present

## 2017-12-02 DIAGNOSIS — Z7982 Long term (current) use of aspirin: Secondary | ICD-10-CM | POA: Diagnosis not present

## 2017-12-02 DIAGNOSIS — F17210 Nicotine dependence, cigarettes, uncomplicated: Secondary | ICD-10-CM | POA: Insufficient documentation

## 2017-12-02 DIAGNOSIS — Z8673 Personal history of transient ischemic attack (TIA), and cerebral infarction without residual deficits: Secondary | ICD-10-CM | POA: Diagnosis not present

## 2017-12-02 DIAGNOSIS — J449 Chronic obstructive pulmonary disease, unspecified: Secondary | ICD-10-CM | POA: Diagnosis not present

## 2017-12-02 DIAGNOSIS — D649 Anemia, unspecified: Secondary | ICD-10-CM | POA: Insufficient documentation

## 2017-12-02 DIAGNOSIS — R001 Bradycardia, unspecified: Secondary | ICD-10-CM | POA: Diagnosis not present

## 2017-12-02 DIAGNOSIS — E039 Hypothyroidism, unspecified: Secondary | ICD-10-CM | POA: Diagnosis present

## 2017-12-02 DIAGNOSIS — S0181XA Laceration without foreign body of other part of head, initial encounter: Secondary | ICD-10-CM | POA: Diagnosis not present

## 2017-12-02 DIAGNOSIS — Z96651 Presence of right artificial knee joint: Secondary | ICD-10-CM | POA: Insufficient documentation

## 2017-12-02 DIAGNOSIS — Z79890 Hormone replacement therapy (postmenopausal): Secondary | ICD-10-CM | POA: Diagnosis not present

## 2017-12-02 DIAGNOSIS — F419 Anxiety disorder, unspecified: Secondary | ICD-10-CM | POA: Insufficient documentation

## 2017-12-02 DIAGNOSIS — R55 Syncope and collapse: Secondary | ICD-10-CM | POA: Diagnosis not present

## 2017-12-02 DIAGNOSIS — Z7951 Long term (current) use of inhaled steroids: Secondary | ICD-10-CM | POA: Diagnosis not present

## 2017-12-02 DIAGNOSIS — J309 Allergic rhinitis, unspecified: Secondary | ICD-10-CM | POA: Insufficient documentation

## 2017-12-02 DIAGNOSIS — I7300 Raynaud's syndrome without gangrene: Secondary | ICD-10-CM | POA: Insufficient documentation

## 2017-12-02 DIAGNOSIS — W19XXXA Unspecified fall, initial encounter: Secondary | ICD-10-CM | POA: Insufficient documentation

## 2017-12-02 DIAGNOSIS — Z79899 Other long term (current) drug therapy: Secondary | ICD-10-CM | POA: Insufficient documentation

## 2017-12-02 DIAGNOSIS — I10 Essential (primary) hypertension: Secondary | ICD-10-CM | POA: Diagnosis not present

## 2017-12-02 DIAGNOSIS — S01112A Laceration without foreign body of left eyelid and periocular area, initial encounter: Secondary | ICD-10-CM | POA: Diagnosis not present

## 2017-12-02 DIAGNOSIS — I959 Hypotension, unspecified: Secondary | ICD-10-CM | POA: Diagnosis not present

## 2017-12-02 LAB — BASIC METABOLIC PANEL
Anion gap: 6 (ref 5–15)
BUN: 22 mg/dL — ABNORMAL HIGH (ref 6–20)
CHLORIDE: 105 mmol/L (ref 101–111)
CO2: 28 mmol/L (ref 22–32)
Calcium: 9.2 mg/dL (ref 8.9–10.3)
Creatinine, Ser: 1.1 mg/dL (ref 0.61–1.24)
GFR calc non Af Amer: >60 mL/min (ref >60)
Glucose, Bld: 110 mg/dL — ABNORMAL HIGH (ref 65–99)
POTASSIUM: 3.9 mmol/L (ref 3.5–5.1)
SODIUM: 139 mmol/L (ref 135–145)

## 2017-12-02 LAB — URINALYSIS, ROUTINE W REFLEX MICROSCOPIC
Bilirubin Urine: NEGATIVE
GLUCOSE, UA: NEGATIVE mg/dL
Hgb urine dipstick: NEGATIVE
Ketones, ur: 5 mg/dL — AB
LEUKOCYTES UA: NEGATIVE
Nitrite: NEGATIVE
PH: 6 (ref 5.0–8.0)
Protein, ur: NEGATIVE mg/dL
Specific Gravity, Urine: 1.017 (ref 1.005–1.030)

## 2017-12-02 LAB — I-STAT TROPONIN, ED: Troponin i, poc: 0 ng/mL (ref 0.00–0.08)

## 2017-12-02 LAB — CBC
HEMATOCRIT: 40.1 % (ref 39.0–52.0)
Hemoglobin: 12.9 g/dL — ABNORMAL LOW (ref 13.0–17.0)
MCH: 30.6 pg (ref 26.0–34.0)
MCHC: 32.2 g/dL (ref 30.0–36.0)
MCV: 95.2 fL (ref 78.0–100.0)
Platelets: 263 10*3/uL (ref 150–400)
RBC: 4.21 MIL/uL — AB (ref 4.22–5.81)
RDW: 12.9 % (ref 11.5–15.5)
WBC: 7.5 10*3/uL (ref 4.0–10.5)

## 2017-12-02 LAB — CBG MONITORING, ED: Glucose-Capillary: 122 mg/dL — ABNORMAL HIGH (ref 65–99)

## 2017-12-02 LAB — TSH: TSH: 1.122 u[IU]/mL (ref 0.350–4.500)

## 2017-12-02 MED ORDER — ACETAMINOPHEN 650 MG RE SUPP: 650.0000 mg | Freq: Four times a day (QID) | RECTAL | Status: DC | PRN

## 2017-12-02 MED ORDER — ASPIRIN EC 81 MG PO TBEC
81.0000 mg | DELAYED_RELEASE_TABLET | Freq: Every day | ORAL | Status: DC
  Administered 2017-12-03 – 2017-12-04 (×2): 81 mg via ORAL
  Filled 2017-12-02 (×2): qty 1

## 2017-12-02 MED ORDER — HYDRALAZINE HCL 20 MG/ML IJ SOLN: 5.0000 mg | INTRAMUSCULAR | Status: DC | PRN

## 2017-12-02 MED ORDER — TETANUS-DIPHTH-ACELL PERTUSSIS 5-2.5-18.5 LF-MCG/0.5 IM SUSP
0.5000 mL | Freq: Once | INTRAMUSCULAR | Status: AC
  Administered 2017-12-02: 0.5 mL via INTRAMUSCULAR
  Filled 2017-12-02: qty 0.5

## 2017-12-02 MED ORDER — ONDANSETRON HCL 4 MG/2ML IJ SOLN: 4.0000 mg | Freq: Four times a day (QID) | INTRAMUSCULAR | Status: DC | PRN

## 2017-12-02 MED ORDER — ENOXAPARIN SODIUM 40 MG/0.4ML ~~LOC~~ SOLN
40.0000 mg | Freq: Every day | SUBCUTANEOUS | Status: DC
  Filled 2017-12-02 (×3): qty 0.4

## 2017-12-02 MED ORDER — SODIUM CHLORIDE 0.9 % IV SOLN
INTRAVENOUS | Status: DC
  Administered 2017-12-03: 02:00:00 via INTRAVENOUS

## 2017-12-02 MED ORDER — ONDANSETRON HCL 4 MG PO TABS: 4.0000 mg | ORAL_TABLET | Freq: Four times a day (QID) | ORAL | Status: DC | PRN

## 2017-12-02 MED ORDER — LEVOTHYROXINE SODIUM 88 MCG PO TABS
88.0000 ug | ORAL_TABLET | Freq: Every day | ORAL | Status: DC
  Administered 2017-12-03 – 2017-12-04 (×2): 88 ug via ORAL
  Filled 2017-12-02 (×3): qty 1

## 2017-12-02 MED ORDER — ACETAMINOPHEN 325 MG PO TABS: 650.0000 mg | ORAL_TABLET | Freq: Four times a day (QID) | ORAL | Status: DC | PRN

## 2017-12-02 NOTE — ED Notes (Signed)
Patient transported to CT 

## 2017-12-02 NOTE — ED Provider Notes (Signed)
Patient placed in Quick Look pathway, seen and evaluated   Chief Complaint: syncopal episode  HPI:   David Buck is a 77 y.o. male who presents to the ED via EMS s/p syncopal episode. Patient reports he was at home and had just watched the news and stood up and felt weak and dizzy and had visual changes and was disoriented. Patient's wife called EMS. when patient fell he hit his head. When EMS arrived patient was lying on the floor but alert and oriented. Patient has laceration to the left side of face just under left eyebrow. Patient is not taking blood thinners.   ROS: Neuro:generalized weakness,    Physical Exam: VS: BP 146/82, P 55, O2 SAT 100% on R/A    Gen: No distress  Neuro: Awake and Alert  Skin: Warm and dry laceration to left side of face just below left eyebrow.        Initiation of care has begun. The patient has been counseled on the process, plan, and necessity for staying for the completion/evaluation, and the remainder of the medical screening examination    Ashley Murrain, NP 12/02/17 [place], MD 12/03/17 [phone]

## 2017-12-02 NOTE — ED Notes (Signed)
Patient asked for urine sample, unable to give sample at this time.

## 2017-12-02 NOTE — ED Triage Notes (Addendum)
Pt to ED via [facility] from home with c/o syncopal episode at home, passed out when standing up from computer- was diaphoretic prior to passing out.  Received 500cc Bolus of NS  Pt states when he first woke up he felt diaphoretic,

## 2017-12-02 NOTE — ED Provider Notes (Signed)
..  Laceration Repair Date/Time: 12/02/2017 9:50 PM Performed by: Rodney Booze, PA-C Authorized by: Rodney Booze, PA-C   Consent:    Consent obtained:  Verbal   Consent given by:  Patient   Risks discussed:  Infection and pain   Alternatives discussed:  No treatment Anesthesia (see MAR for exact dosages):    Anesthesia method:  None Laceration details:    Location: lateral to left eye.   Length (cm):  1 Repair type:    Repair type:  Simple Pre-procedure details:    Preparation:  Patient was prepped and draped in usual sterile fashion Exploration:    Hemostasis achieved with:  Direct pressure   Wound exploration: wound explored through full range of motion     Contaminated: no   Treatment:    Area cleansed with:  Saline   Amount of cleaning:  Standard   Irrigation solution:  Sterile saline   Irrigation volume:  147ml   Irrigation method:  Pressure wash   Visualized foreign bodies/material removed: no   Skin repair:    Repair method:  Tissue adhesive Approximation:    Approximation:  Close Post-procedure details:    Dressing:  Open (no dressing)   Patient tolerance of procedure:  Tolerated well, no immediate complications    Rodney Booze, PA-C 12/02/17 [id number]    Malvin Johns, MD 12/02/17 2207

## 2017-12-02 NOTE — ED Provider Notes (Signed)
[facility] EMERGENCY DEPARTMENT Provider Note   CSN: [id number] Arrival date & time: 12/02/17  1338     History   Chief Complaint Chief Complaint  Patient presents with  . Loss of Consciousness    HPI David Buck is a 77 y.o. male.  Patient is a 77 year old male with a history of hypertension, COPD, Mnire's disease and hypothyroidism who presents after syncopal event.  He states that he was lying in bed and got all of a sudden diaphoretic.  He got up to walk around and he felt a little off.  His vision went blurry and he felt lightheaded like he might pass out.  He then woke up on the floor with his wife finding him.  He hit the corner of his head on a dresser.  There is no other injuries from the fall.  He denies any neck or back pain from the incident.  He denies any numbness or weakness to his extremities.  No speech deficits.  When he woke up he said he felt pretty much back to normal.  He denies any chest pain or shortness of breath.  No palpitations preceding the fall.  No recent illnesses.  No history of similar events in the past.  He was found to be hypotensive by EMS with blood pressure systolically in the 88C.  He states he did recently increase his hydrochlorothiazide from 12.5 mg to 25 mg.  This was done because his blood pressures were more elevated than they typically are.  This was done about a week ago.  He also started Lexapro last night.  He denies any other changes in his medications.     Past Medical History:  Diagnosis Date  . Allergy    rhinitis  . COPD (chronic obstructive pulmonary disease) ([place])   . History of hepatitis B    recovered  . Hypertension   . Hypothyroidism   . 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 Meniere's disease   . Osteoarthritis of right knee    Dr Percell Miller  . Pneumonia    hx of 2/17   . Posterior vitreous detachment 2011   R, body- Dr Baird Cancer  . Prostatitis   . Tinnitus     Patient  Active Problem List   Diagnosis Date Noted  . Sebaceous cyst 03/08/2017  . Preop exam for internal medicine 12/13/2015  . Microhematuria 09/12/2015  . Fatigue 08/01/2015  . UTI (urinary tract infection) 08/01/2015  . Acute upper respiratory infection 08/01/2015  . CAP (community acquired pneumonia) 08/01/2015  . Libido, decreased 08/01/2015  . TIA (transient ischemic attack) 12/03/2014  . Vertigo 10/20/2014  . 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 Elevated PSA 11/30/2013  . Osteoarthritis of [place] joint of thumb 11/13/2013  . OA (osteoarthritis) of knee 11/13/2013  . Thumb pain 11/12/2013  . Knee pain 11/12/2013  . Essential hypertension 10/17/2012  . Elevated blood pressure 11/05/2011  . Well adult exam 11/03/2010  . 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 Lung nodule 01/30/2010  . SORE THROAT 06/24/2009  . TREMOR, ESSENTIAL 02/07/2009  . 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 ARM PAIN 02/07/2009  . Seasonal and perennial allergic rhinitis 07/13/2008  . ABNORMAL GLUCOSE NEC 07/13/2008  . SINUSITIS, ACUTE 02/03/2008  . TOBACCO USE DISORDER/SMOKER-SMOKING CESSATION DISCUSSED 10/02/2007  . WEIGHT LOSS, ABNORMAL 10/02/2007  . COPD mixed type ([place]) 08/04/2007  . ACQUIRED CYST OF KIDNEY 08/04/2007  . Hypothyroidism 07/24/2007  . RBBB 07/23/2007  .  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 OSTEOARTHRITIS 07/23/2007  . WHEEZING 07/23/2007  . Nonspecific (abnormal) findings on radiological and other examination of body structure 07/23/2007  . CHEST XRAY, ABNORMAL 07/23/2007    Past Surgical History:  Procedure Laterality Date  . bilateral cataract surgery     . BRONCHOSCOPY  04-14-08   benign  . INGUINAL HERNIA REPAIR  2005   Dr Hassell Done- bilateral   . right knee menisc tear repair    . 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   Procedure: RIGHT TOTAL KNEE ARTHROPLASTY;  Surgeon: Gaynelle Arabian, MD;  Location: [facility];  Service: Orthopedics;  Laterality: Right;        Home Medications    Prior to  Admission medications   Medication Sig Start Date End Date Taking? Authorizing Provider  aspirin EC 81 MG tablet Take 81 mg by mouth daily.   Yes [provider]  Azelastine-Fluticasone (DYMISTA) 137-50 MCG/ACT SUSP Place 1 puff into the nose daily. 08/01/17  Yes Baird Lyons D, MD  Cholecalciferol 2000 units TABS Take 1 tablet by mouth daily.   Yes [provider]  escitalopram (LEXAPRO) 10 MG tablet Take 1 tablet (10 mg total) by mouth daily. 11/26/17  Yes Marrian Salvage, FNP  Glucosamine HCl 1000 MG TABS Take 1 tablet by mouth daily.   Yes [provider]  hydrochlorothiazide (HYDRODIURIL) 25 MG tablet Take 1 tablet (25 mg total) by mouth daily. 11/26/17  Yes Marrian Salvage, FNP  levothyroxine (SYNTHROID, LEVOTHROID) 88 MCG tablet Take 1 tablet (88 mcg total) by mouth daily. 11/01/17  Yes [name], [name], MD  loratadine (CLARITIN) 10 MG tablet Take 10 mg by mouth daily.    Yes [provider]  Multiple Vitamin (MULTIVITAMIN) tablet Take 1 tablet by mouth daily.   Yes [provider]  saw palmetto 160 MG capsule Take 160 mg by mouth daily.   Yes [provider]  vitamin C (ASCORBIC ACID) 500 MG tablet Take 500 mg by mouth daily.   Yes [provider]  hydrochlorothiazide (MICROZIDE) 12.5 MG capsule Take 1 capsule (12.5 mg total) by mouth daily. Patient not taking: Reported on 12/02/2017 10/22/16   [name], [name], MD    Family History Family History  Problem Relation Age of Onset  . Cancer Mother        ovarian and bowel  . Other Father        cerebral hemorrage  . Hypertension Other     Social History Social History   Tobacco Use  . Smoking status: Current Every Day Smoker    Packs/day: 1.00    Years: 50.00    Pack years: 50.00    Types: Cigarettes  . Smokeless tobacco: Never Used  Substance Use Topics  . Alcohol use: Yes    Comment: occasional   . Drug use: No     Allergies   Cefdinir  and Cephalosporins   Review of Systems Review of Systems  Constitutional: Positive for diaphoresis and fatigue. Negative for chills and fever.  HENT: Negative for congestion, rhinorrhea and sneezing.   Eyes: Positive for visual disturbance.  Respiratory: Negative for cough, chest tightness and shortness of breath.   Cardiovascular: Negative for chest pain and leg swelling.  Gastrointestinal: Negative for abdominal pain, blood in stool, diarrhea, nausea and vomiting.  Genitourinary: Negative for difficulty urinating, flank pain, frequency and hematuria.  Musculoskeletal: Negative for arthralgias and back pain.  Skin: Negative for rash.  Neurological: Positive for dizziness, syncope and  light-headedness. Negative for speech difficulty, weakness, numbness and headaches.     Physical Exam Updated Vital Signs BP (!) 147/80   Pulse (!) 46   Temp 97.6 F (36.4 C) (Oral)   Resp 11   Ht 6\' 2"  (1.88 m)   Wt 75.8 kg (167 lb)   SpO2 100%   BMI 21.44 kg/m   Physical Exam  Constitutional: He is oriented to person, place, and time. He appears well-developed and well-nourished.  HENT:  Head: Normocephalic and atraumatic.  Patient has 1.5 similar laceration just under his left eyelid.  There is no involvement of the eye.  It is a superficial appearing laceration.  There is no fat protrusion.  No significant swelling around the eye or bony tenderness to the orbits.  Extraocular eye movements are intact.  There is no active bleeding.  No other facial trauma is noted.  Eyes: Pupils are equal, round, and reactive to light.  Neck: Normal range of motion. Neck supple.  No pain along the cervical thoracic or lumbosacral spine  Cardiovascular: Regular rhythm and normal heart sounds. Bradycardia present.  Pulmonary/Chest: Effort normal and breath sounds normal. No respiratory distress. He has no wheezes. He has no rales. He exhibits no tenderness.  Abdominal: Soft. Bowel sounds are normal. There is no  tenderness. There is no rebound and no guarding.  Musculoskeletal: Normal range of motion. He exhibits no edema.  No pain on palpation or range of motion of the extremities  Lymphadenopathy:    He has no cervical adenopathy.  Neurological: He is alert and oriented to person, place, and time.  Motor 5/5 all extremities Sensation grossly intact to LT all extremities Finger to Nose intact, no pronator drift CN II-XII grossly intact    Skin: Skin is warm and dry. No rash noted.  Psychiatric: He has a normal mood and affect.     ED Treatments / Results  Labs (all labs ordered are listed, but only abnormal results are displayed) Labs Reviewed  BASIC METABOLIC PANEL - Abnormal; Notable for the following components:      Result Value   Glucose, Bld 110 (*)    BUN 22 (*)    All other components within normal limits  CBC - Abnormal; Notable for the following components:   RBC 4.21 (*)    Hemoglobin 12.9 (*)    All other components within normal limits  URINALYSIS, ROUTINE W REFLEX MICROSCOPIC - Abnormal; Notable for the following components:   Ketones, ur 5 (*)    All other components within normal limits  CBG MONITORING, ED - Abnormal; Notable for the following components:   Glucose-Capillary 122 (*)    All other components within normal limits  TSH  I-STAT TROPONIN, ED    EKG EKG Interpretation  Date/Time:  Monday December 02 2017 13:53:31 EDT Ventricular Rate:  48 PR Interval:  212 QRS Duration: 98 QT Interval:  460 QTC Calculation: 410 R Axis:   -66 Text Interpretation:  Sinus bradycardia with 1st degree A-V block Left axis deviation Incomplete right bundle branch block Cannot rule out Inferior infarct , age undetermined Abnormal ECG Confirmed by Malvin Johns [phone]) on 12/02/2017 8:59:03 PM   Radiology Ct Head Wo Contrast  Result Date: 12/02/2017 CLINICAL DATA:  Syncope. EXAM: CT HEAD WITHOUT CONTRAST TECHNIQUE: Contiguous axial images were obtained from the base of  the skull through the vertex without intravenous contrast. COMPARISON:  None. FINDINGS: Brain: There is no evidence for acute hemorrhage, hydrocephalus, mass lesion, or abnormal  extra-axial fluid collection. No definite CT evidence for acute infarction. Diffuse loss of parenchymal volume is consistent with atrophy. Vascular: No hyperdense vessel or unexpected calcification. Skull: No evidence for fracture. No worrisome lytic or sclerotic lesion. Sinuses/Orbits: The visualized paranasal sinuses and mastoid air cells are clear. Visualized portions of the globes and intraorbital fat are unremarkable. Other: None. IMPRESSION: 1. No acute intracranial abnormality. 2. Diffuse atrophy. Electronically Signed   By: Misty Stanley M.D.   On: 12/02/2017 20:24    Procedures Procedures (including critical care time)  Medications Ordered in ED Medications  Tdap (BOOSTRIX) injection 0.5 mL (has no administration in time range)     Initial Impression / Assessment and Plan / ED Course  I have reviewed the triage vital signs and the nursing notes.  Pertinent labs & imaging results that were available during my care of the patient were reviewed by me and considered in my medical decision making (see chart for details).     Patient is a 77 year old male who presents after syncopal episode.  His EKG shows sinus bradycardia and his heart rate is dropping down in the 40s.  There is no evidence of heart block.  He does have a subtle first-degree block.  There is also a left anterior fascicular block and incomplete right bundle branch block was is unchanged from his prior EKGs.  There is no ischemic changes.  His troponin is negative.  He had a CT scan of his head which showed no intracranial hemorrhage or orbital fracture.  His small wound was repaired with Dermabond.  His tetanus shot was updated.  Given his bradycardia, I will admit the patient for observation.  I spoke with Dr. Hal Hope who will admit the  patient.  Final Clinical Impressions(s) / ED Diagnoses   Final diagnoses:  Syncope, unspecified syncope type  Bradycardia  Laceration of forehead, initial encounter    ED Discharge Orders    None       Malvin Johns, MD 12/02/17 2154

## 2017-12-02 NOTE — H&P (Signed)
History and Physical    David Buck DZH:[id number] DOB: 01/12/1941 DOA: 12/02/2017  PCP: Cassandria Anger, MD  Patient coming from: Home.  Chief Complaint: Loss of consciousness.  HPI: David Buck is a 77 y.o. male with history of hypertension, hypothyroidism, anxiety who was recently started on Lexapro and had taken his first dose yesterday and also had recently been increased on his hydrochlorothiazide and Synthroid 2 weeks ago was brought to the ER after patient had a syncopal episode.  Patient states that since this morning patient was having diaphoresis and was feeling weak.  At around noon time while sitting in front of the computer and when patient stood up felt dizzy and passed out.  Patient wife on arrival found patient on the floor unconscious and had thrown up.  Did not have any incontinence of urine or any seizure-like activities.  EMS was called and patient regained consciousness around few minutes later.  Patient did have a small laceration on his left periorbital area.  Patient was found to be hypotensive and bradycardic at this point.  Patient states he has been recently stressed with plan to move out of his house.  ED Course: In the ER patient blood pressure improved with fluids.  Patient was having sinus bradycardia with rate around 48 bpm.  Patient on my exam is nonfocal.  CT head is unremarkable.  Patient admitted for further management of syncope.  Review of Systems: As per HPI, rest all negative.   Past Medical History:  Diagnosis Date  . Allergy    rhinitis  . COPD (chronic obstructive pulmonary disease) ([place])   . History of hepatitis B    recovered  . Hypertension   . Hypothyroidism   . 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 Meniere's disease   . Osteoarthritis of right knee    Dr Percell Miller  . Pneumonia    hx of 2/17   . Posterior vitreous detachment 2011   R, body- Dr Baird Cancer  . Prostatitis   . Tinnitus     Past Surgical  History:  Procedure Laterality Date  . bilateral cataract surgery     . BRONCHOSCOPY  04-14-08   benign  . INGUINAL HERNIA REPAIR  2005   Dr Hassell Done- bilateral   . right knee menisc tear repair    . 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   Procedure: RIGHT TOTAL KNEE ARTHROPLASTY;  Surgeon: Gaynelle Arabian, MD;  Location: [facility];  Service: Orthopedics;  Laterality: Right;     reports that he has been smoking cigarettes.  He has a 50.00 pack-year smoking history. He has never used smokeless tobacco. He reports that he drinks alcohol. He reports that he does not use drugs.  Allergies  Allergen Reactions  . Cefdinir Rash    REACTION: Rash  . Cephalosporins Other (See Comments)    unknown    Family History  Problem Relation Age of Onset  . Cancer Mother        ovarian and bowel  . Other Father        cerebral hemorrage  . Hypertension Other     Prior to Admission medications   Medication Sig Start Date End Date Taking? Authorizing Provider  aspirin EC 81 MG tablet Take 81 mg by mouth daily.   Yes [provider]  Azelastine-Fluticasone (DYMISTA) 137-50 MCG/ACT SUSP Place 1 puff into the nose daily. 08/01/17  Yes Deneise Lever, MD  Cholecalciferol 2000 units TABS  Take 1 tablet by mouth daily.   Yes [provider]  escitalopram (LEXAPRO) 10 MG tablet Take 1 tablet (10 mg total) by mouth daily. 11/26/17  Yes Marrian Salvage, FNP  Glucosamine HCl 1000 MG TABS Take 1 tablet by mouth daily.   Yes [provider]  hydrochlorothiazide (HYDRODIURIL) 25 MG tablet Take 1 tablet (25 mg total) by mouth daily. 11/26/17  Yes Marrian Salvage, FNP  levothyroxine (SYNTHROID, LEVOTHROID) 88 MCG tablet Take 1 tablet (88 mcg total) by mouth daily. 11/01/17  Yes [name], [name], MD  loratadine (CLARITIN) 10 MG tablet Take 10 mg by mouth daily.    Yes [provider]  Multiple Vitamin (MULTIVITAMIN) tablet Take 1 tablet by mouth daily.   Yes [provider]  saw palmetto 160 MG capsule Take 160 mg by mouth daily.   Yes [provider]  vitamin C (ASCORBIC ACID) 500 MG tablet Take 500 mg by mouth daily.   Yes [provider]  hydrochlorothiazide (MICROZIDE) 12.5 MG capsule Take 1 capsule (12.5 mg total) by mouth daily. Patient not taking: Reported on 12/02/2017 10/22/16   Cassandria Anger, MD    Physical Exam: Vitals:   12/02/17 2130 12/02/17 2145 12/02/17 2215 12/02/17 2230  BP: (!) 147/80 137/88 (!) 155/73 (!) 119/53  Pulse: (!) 46 (!) 55 62 (!) 53  Resp: 11 12 19 12   Temp:      TempSrc:      SpO2: 100% 99% 100% 100%  Weight:      Height:          Constitutional: Moderately built and nourished. Vitals:   12/02/17 2130 12/02/17 2145 12/02/17 2215 12/02/17 2230  BP: (!) 147/80 137/88 (!) 155/73 (!) 119/53  Pulse: (!) 46 (!) 55 62 (!) 53  Resp: 11 12 19 12   Temp:      TempSrc:      SpO2: 100% 99% 100% 100%  Weight:      Height:       Eyes: Small laceration of the left periorbital area. ENMT: No discharge from the ears eyes nose or mouth. Neck: No mass felt.  No JVD appreciated.  Respiratory: No rhonchi or crepitations. Cardiovascular: S1-S2 heard no murmurs appreciated. Abdomen: Soft nontender bowel sounds present. Musculoskeletal: No edema.  No joint effusion. Skin: Small laceration on the left periorbital area. Neurologic: Alert awake oriented to time place and person.  Moves all extremities 5 x 5.  No facial asymmetry tongue is midline. Psychiatric: Appears normal.  Normal affect.   Labs on Admission: I have personally reviewed following labs and imaging studies  CBC: Recent Labs  Lab 12/02/17 1407  WBC 7.5  HGB 12.9*  HCT 40.1  MCV 95.2  PLT 364   Basic Metabolic Panel: Recent Labs  Lab 12/02/17 1407  NA 139  K 3.9  CL 105  CO2 28  GLUCOSE 110*  BUN 22*  CREATININE 1.10  CALCIUM 9.2   GFR: Estimated Creatinine Clearance: 62.2 mL/min (by C-G formula based on SCr  of 1.1 mg/dL). Liver Function Tests: No results for input(s): AST, ALT, ALKPHOS, BILITOT, PROT, ALBUMIN in the last 168 hours. No results for input(s): LIPASE, AMYLASE in the last 168 hours. No results for input(s): AMMONIA in the last 168 hours. Coagulation Profile: No results for input(s): INR, PROTIME in the last 168 hours. Cardiac Enzymes: No results for input(s): CKTOTAL, CKMB, CKMBINDEX, TROPONINI in the last 168 hours. BNP (last 3 results) No results for input(s):  PROBNP in the last 8760 hours. HbA1C: No results for input(s): HGBA1C in the last 72 hours. CBG: Recent Labs  Lab 12/02/17 1406  GLUCAP 122*   Lipid Profile: No results for input(s): CHOL, HDL, LDLCALC, TRIG, CHOLHDL, LDLDIRECT in the last 72 hours. Thyroid Function Tests: No results for input(s): TSH, T4TOTAL, FREET4, T3FREE, THYROIDAB in the last 72 hours. Anemia Panel: No results for input(s): VITAMINB12, FOLATE, FERRITIN, TIBC, IRON, RETICCTPCT in the last 72 hours. Urine analysis:    Component Value Date/Time   COLORURINE YELLOW 12/02/2017 1853   APPEARANCEUR CLEAR 12/02/2017 1853   LABSPEC 1.017 12/02/2017 1853   PHURINE 6.0 12/02/2017 1853   GLUCOSEU NEGATIVE 12/02/2017 1853   GLUCOSEU NEGATIVE 10/23/2016 1420   HGBUR NEGATIVE 12/02/2017 1853   BILIRUBINUR NEGATIVE 12/02/2017 1853   BILIRUBINUR Negative 08/27/2017 1516   KETONESUR 5 (A) 12/02/2017 1853   PROTEINUR NEGATIVE 12/02/2017 1853   UROBILINOGEN 0.2 08/27/2017 1516   UROBILINOGEN 0.2 10/23/2016 1420   NITRITE NEGATIVE 12/02/2017 1853   LEUKOCYTESUR NEGATIVE 12/02/2017 1853   Sepsis Labs: @LABRCNTIP (procalcitonin:4,lacticidven:4) )No results found for this or any previous visit (from the past 240 hour(s)).   Radiological Exams on Admission: Ct Head Wo Contrast  Result Date: 12/02/2017 CLINICAL DATA:  Syncope. EXAM: CT HEAD WITHOUT CONTRAST TECHNIQUE: Contiguous axial images were obtained from the base of the skull through the vertex  without intravenous contrast. COMPARISON:  None. FINDINGS: Brain: There is no evidence for acute hemorrhage, hydrocephalus, mass lesion, or abnormal extra-axial fluid collection. No definite CT evidence for acute infarction. Diffuse loss of parenchymal volume is consistent with atrophy. Vascular: No hyperdense vessel or unexpected calcification. Skull: No evidence for fracture. No worrisome lytic or sclerotic lesion. Sinuses/Orbits: The visualized paranasal sinuses and mastoid air cells are clear. Visualized portions of the globes and intraorbital fat are unremarkable. Other: None. IMPRESSION: 1. No acute intracranial abnormality. 2. Diffuse atrophy. Electronically Signed   By: Misty Stanley M.D.   On: 12/02/2017 20:24    EKG: Independently reviewed.  Sinus bradycardia with rate around 48 bpm.  Assessment/Plan Principal Problem:   Syncope Active Problems:   Hypothyroidism   COPD mixed type ([facility])   Essential hypertension    1. Syncope -patient was hypotensive and bradycardic at the evening.  Blood pressure improved with fluids.  Presently systolic is around 909.  Heart rate is around 56 bpm at the time of my exam.  We will closely monitor in telemetry.  Check 2D echo cycle cardiac markers.  Will hold Lexapro and hydrochlorothiazide for now.  Check EEG. 2. History of hypertension -since patient was hypotensive on arrival we will hold hydrochlorothiazide and keep patient on PRN IV hydralazine and closely follow blood pressure trends.  Check orthostatics in the morning. 3. Hypothyroidism on Synthroid. 4. History of anxiety recently was placed on Lexapro first dose was taken yesterday.  Will hold off Lexapro for now. 5. Normocytic normochromic anemia -follow CBC. 6. COPD not actively wheezing. 7. Tobacco abuse -tobacco cessation counseling requested.   DVT prophylaxis: Lovenox. Code Status: Full code. Family Communication: Patient's wife. Disposition Plan: Home. Consults called:  None. Admission status: Observation.   Rise Patience MD [facility] Pager [phone].  If 7PM-7AM, please contact night-coverage [email or web address] Password [facility]  12/02/2017, 10:58 PM

## 2017-12-02 NOTE — ED Notes (Signed)
Pt placed in hospital bed

## 2017-12-03 ENCOUNTER — Observation Stay (HOSPITAL_COMMUNITY): Payer: Medicare Other

## 2017-12-03 ENCOUNTER — Observation Stay (HOSPITAL_BASED_OUTPATIENT_CLINIC_OR_DEPARTMENT_OTHER): Payer: Medicare Other

## 2017-12-03 DIAGNOSIS — R55 Syncope and collapse: Secondary | ICD-10-CM

## 2017-12-03 DIAGNOSIS — J449 Chronic obstructive pulmonary disease, unspecified: Secondary | ICD-10-CM

## 2017-12-03 DIAGNOSIS — I340 Nonrheumatic mitral (valve) insufficiency: Secondary | ICD-10-CM | POA: Diagnosis not present

## 2017-12-03 DIAGNOSIS — I10 Essential (primary) hypertension: Secondary | ICD-10-CM | POA: Diagnosis not present

## 2017-12-03 LAB — BASIC METABOLIC PANEL
ANION GAP: 8 (ref 5–15)
BUN: 18 mg/dL (ref 6–20)
CHLORIDE: 105 mmol/L (ref 101–111)
CO2: 26 mmol/L (ref 22–32)
CREATININE: 0.87 mg/dL (ref 0.61–1.24)
Calcium: 9 mg/dL (ref 8.9–10.3)
GFR calc non Af Amer: >60 mL/min (ref >60)
Glucose, Bld: 96 mg/dL (ref 65–99)
POTASSIUM: 3.4 mmol/L — AB (ref 3.5–5.1)
SODIUM: 139 mmol/L (ref 135–145)

## 2017-12-03 LAB — ECHOCARDIOGRAM COMPLETE
HEIGHTINCHES: 74 in
WEIGHTICAEL: 2672 [oz_av]

## 2017-12-03 LAB — CBC
HEMATOCRIT: 36.6 % — AB (ref 39.0–52.0)
HEMOGLOBIN: 12 g/dL — AB (ref 13.0–17.0)
MCH: 30.8 pg (ref 26.0–34.0)
MCHC: 32.8 g/dL (ref 30.0–36.0)
MCV: 94.1 fL (ref 78.0–100.0)
PLATELETS: 245 10*3/uL (ref 150–400)
RBC: 3.89 MIL/uL — AB (ref 4.22–5.81)
RDW: 12.8 % (ref 11.5–15.5)
WBC: 8 10*3/uL (ref 4.0–10.5)

## 2017-12-03 LAB — TROPONIN I
Troponin I: <0.03 ng/mL (ref <0.03)
Troponin I: <0.03 ng/mL (ref <0.03)

## 2017-12-03 MED ORDER — NICOTINE 14 MG/24HR TD PT24
14.0000 mg | MEDICATED_PATCH | Freq: Every day | TRANSDERMAL | Status: DC
  Administered 2017-12-03 – 2017-12-04 (×2): 14 mg via TRANSDERMAL
  Filled 2017-12-03 (×2): qty 1

## 2017-12-03 MED ORDER — LACTATED RINGERS IV SOLN
INTRAVENOUS | Status: DC
  Administered 2017-12-03 (×2): via INTRAVENOUS

## 2017-12-03 MED ORDER — LACTATED RINGERS IV SOLN: INTRAVENOUS | Status: DC

## 2017-12-03 NOTE — ED Triage Notes (Signed)
Pt updated on Plan for EEG

## 2017-12-03 NOTE — ED Triage Notes (Signed)
Call to vascular lab to report Pt has an admission bed on [place] room 29.

## 2017-12-03 NOTE — Progress Notes (Signed)
Pt admitted to [place] room 29. Pt A&O x4. Family at bedside. Full assessment charted. All questions/concerns addressed. Call bell within reach. Will continue to monitor.

## 2017-12-03 NOTE — Progress Notes (Signed)
PROGRESS NOTE    David Buck  PYK:[id number] DOB: [birth year] DOA: 12/02/2017 PCP: Cassandria Anger, MD    Brief Narrative:  77 y.o. male with history of hypertension, hypothyroidism, anxiety who was recently started on Lexapro and had taken his first dose yesterday and also had recently been increased on his hydrochlorothiazide and Synthroid 2 weeks ago was brought to the ER after patient had a syncopal episode.  Patient states that since this morning patient was having diaphoresis and was feeling weak.  At around noon time while sitting in front of the computer and when patient stood up felt dizzy and passed out.  Patient wife on arrival found patient on the floor unconscious and had thrown up.  Did not have any incontinence of urine or any seizure-like activities.  EMS was called and patient regained consciousness around few minutes later.  Patient did have a small laceration on his left periorbital area.  Patient was found to be hypotensive and bradycardic at this point.  Patient states he has been recently stressed with plan to move out of his house.  ED Course: In the ER patient blood pressure improved with fluids.  Patient was having sinus bradycardia with rate around 48 bpm.  Patient on my exam is nonfocal.  CT head is unremarkable.  Patient admitted for further management of syncope   Assessment & Plan:   Principal Problem:   Syncope Active Problems:   Hypothyroidism   COPD mixed type ([place])   Essential hypertension   1. Syncope 1. Patient noted to be more hypotensive at time of presentation 2. Orthostatic vital signs were obtained this morning.  Patient clinically orthostatic from sitting to standing over 20 mm drop in systolic blood pressure noted. 3. Questioning, patient noted to have poor p.o. intake chronically, p.o. fluid intake at home.  In addition, patient's home diuretic was recently increased as well. 4. We will continue patient on scheduled IV fluid hydration  for the next 24 hours. 5. Will repeat orthostatic vital signs in the morning 6. EEG was obtained for concerns of syncope noted to be normal 7. Echocardiogram was performed, final report is pending at the time of this dictation. 2. History of hypertension 1. Diuretic held at time of admission 2. Continue IV fluid as tolerated 3. Hypothyroidism on Synthroid 1. Continue thyroid replacement as tolerated. 4. History of anxiety 1. Patient was recently started on Lexapro 2. Lexapro held at time of presentation.  If stable, consider resuming on discharge 5. Normocytic normochromic anemia 1. Heme dynamically stable at this time 2. Follow CBC. 6. COPD 1. Appears to be stable. 2. No active wheezing on auscultation 7. Tobacco abuse -tobacco cessation counseling requested.   DVT prophylaxis: Lovenox  Code Status: Full code Family Communication:, Family at bedside Disposition Plan: Possible discharge in the next 24 hours if no longer orthostatic  Consultants:     Procedures:     Antimicrobials: Anti-infectives (From admission, onward)   None       Subjective: Denies chest pain or shortness of breath  Objective: Vitals:   12/03/17 0700 12/03/17 0800 12/03/17 1020 12/03/17 1031  BP: 134/70 127/72  102/89  Pulse: 63 60  60  Resp: 19 15  18   Temp:    98.2 F (36.8 C)  TempSrc:      SpO2: 94% 96%  95%  Weight:      Height:   6\' 2"  (1.88 m)     Intake/Output Summary (Last 24 hours) at 12/03/2017 1714  Last data filed at 12/03/2017 1300 Gross per 24 hour  Intake 300 ml  Output -  Net 300 ml   Filed Weights   12/02/17 1356  Weight: 75.8 kg (167 lb)    Examination:  General exam: Appears calm and comfortable  Respiratory system: Clear to auscultation. Respiratory effort normal. Cardiovascular system: S1 & S2 heard, RRR Gastrointestinal system: Abdomen is nondistended, soft and nontender. No organomegaly or masses felt. Normal bowel sounds heard. Central nervous  system: Alert and oriented. No focal neurological deficits. Extremities: Symmetric 5 x 5 power. Skin: No rashes, lesions  Psychiatry: Judgement and insight appear normal. Mood & affect appropriate.   Data Reviewed: I have personally reviewed following labs and imaging studies  CBC: Recent Labs  Lab 12/02/17 1407 12/03/17 0700  WBC 7.5 8.0  HGB 12.9* 12.0*  HCT 40.1 36.6*  MCV 95.2 94.1  PLT 263 277   Basic Metabolic Panel: Recent Labs  Lab 12/02/17 1407 12/03/17 0700  NA 139 139  K 3.9 3.4*  CL 105 105  CO2 28 26  GLUCOSE 110* 96  BUN 22* 18  CREATININE 1.10 0.87  CALCIUM 9.2 9.0   GFR: Estimated Creatinine Clearance: 78.7 mL/min (by C-G formula based on SCr of 0.87 mg/dL). Liver Function Tests: No results for input(s): AST, ALT, ALKPHOS, BILITOT, PROT, ALBUMIN in the last 168 hours. No results for input(s): LIPASE, AMYLASE in the last 168 hours. No results for input(s): AMMONIA in the last 168 hours. Coagulation Profile: No results for input(s): INR, PROTIME in the last 168 hours. Cardiac Enzymes: Recent Labs  Lab 12/03/17 0249 12/03/17 0700 12/03/17 1023  TROPONINI <0.03 <0.03 <0.03   BNP (last 3 results) No results for input(s): PROBNP in the last 8760 hours. HbA1C: No results for input(s): HGBA1C in the last 72 hours. CBG: Recent Labs  Lab 12/02/17 1406  GLUCAP 122*   Lipid Profile: No results for input(s): CHOL, HDL, LDLCALC, TRIG, CHOLHDL, LDLDIRECT in the last 72 hours. Thyroid Function Tests: Recent Labs    12/02/17 2206  TSH 1.122   Anemia Panel: No results for input(s): VITAMINB12, FOLATE, FERRITIN, TIBC, IRON, RETICCTPCT in the last 72 hours. Sepsis Labs: No results for input(s): PROCALCITON, LATICACIDVEN in the last 168 hours.  No results found for this or any previous visit (from the past 240 hour(s)).   Radiology Studies: Ct Head Wo Contrast  Result Date: 12/02/2017 CLINICAL DATA:  Syncope. EXAM: CT HEAD WITHOUT CONTRAST  TECHNIQUE: Contiguous axial images were obtained from the base of the skull through the vertex without intravenous contrast. COMPARISON:  None. FINDINGS: Brain: There is no evidence for acute hemorrhage, hydrocephalus, mass lesion, or abnormal extra-axial fluid collection. No definite CT evidence for acute infarction. Diffuse loss of parenchymal volume is consistent with atrophy. Vascular: No hyperdense vessel or unexpected calcification. Skull: No evidence for fracture. No worrisome lytic or sclerotic lesion. Sinuses/Orbits: The visualized paranasal sinuses and mastoid air cells are clear. Visualized portions of the globes and intraorbital fat are unremarkable. Other: None. IMPRESSION: 1. No acute intracranial abnormality. 2. Diffuse atrophy. Electronically Signed   By: Misty Stanley M.D.   On: 12/02/2017 20:24    Scheduled Meds: . aspirin EC  81 mg Oral Daily  . enoxaparin (LOVENOX) injection  40 mg Subcutaneous Daily  . levothyroxine  88 mcg Oral QAC breakfast  . nicotine  14 mg Transdermal Daily   Continuous Infusions: . lactated ringers 100 mL/hr at 12/03/17 1313     LOS: 0 days  Marylu Lund, MD [facility] Pager [phone]  If 7PM-7AM, please contact night-coverage [email or web address] Password [facility] 12/03/2017, 5:14 PM

## 2017-12-03 NOTE — ED Notes (Signed)
Pt asked how he was feeling during orthostatic vitals. Pt stated that he "felt fine".

## 2017-12-03 NOTE — Progress Notes (Signed)
EEG completed; results pending.    

## 2017-12-03 NOTE — ED Triage Notes (Signed)
PT. Family escorted by NT to [facility]  Room 29  To wait for pt from vascular.

## 2017-12-03 NOTE — Procedures (Signed)
HPI:  77 y/o with syncope and seizure like activity  TECHNICAL SUMMARY:  A multichannel referential and bipolar montage EEG using the standard international 10-20 system was performed on the patient described as briefly awake and mostly drowsy/sleep.  The dominant background activity consists of 9 hertz activity seen most prominantly over the posterior head region.   Low voltage fast (beta) activity is distributed symmetrically and maximally over the anterior head regions.  ACTIVATION:  Stepwise photic stimulation and hyperventilation is not performed.  EPILEPTIFORM ACTIVITY:  There were no spikes, sharp waves or paroxysmal activity.  SLEEP: Stage I and II sleep architecture are identified.  CARDIAC:  The EKG lead was not well recorded and was primarily full of artifact.  IMPRESSION:  This is a normal EEG for the patients stated age.  There were no focal, hemispheric or lateralizing features.  No epileptiform activity was recorded.  A normal EEG does not exclude the diagnosis of a seizure disorder and if seizure remains high on the list of differential diagnosis, an ambulatory EEG may be of value.  Clinical correlation is required.

## 2017-12-03 NOTE — ED Notes (Signed)
PT off floor in Vascular lab for EEG . TC to vascular to update new room location and Family escorted to [address] room 29  To wait for PT.

## 2017-12-04 DIAGNOSIS — R55 Syncope and collapse: Secondary | ICD-10-CM | POA: Diagnosis not present

## 2017-12-04 LAB — CBC
HCT: 35 % — ABNORMAL LOW (ref 39.0–52.0)
Hemoglobin: 11.2 g/dL — ABNORMAL LOW (ref 13.0–17.0)
MCH: 30.5 pg (ref 26.0–34.0)
MCHC: 32 g/dL (ref 30.0–36.0)
MCV: 95.4 fL (ref 78.0–100.0)
PLATELETS: 200 10*3/uL (ref 150–400)
RBC: 3.67 MIL/uL — AB (ref 4.22–5.81)
RDW: 12.7 % (ref 11.5–15.5)
WBC: 5.8 10*3/uL (ref 4.0–10.5)

## 2017-12-04 LAB — BASIC METABOLIC PANEL
Anion gap: 6 (ref 5–15)
BUN: 23 mg/dL — ABNORMAL HIGH (ref 6–20)
CALCIUM: 9.1 mg/dL (ref 8.9–10.3)
CO2: 26 mmol/L (ref 22–32)
CREATININE: 0.92 mg/dL (ref 0.61–1.24)
Chloride: 109 mmol/L (ref 101–111)
GFR calc non Af Amer: >60 mL/min (ref >60)
Glucose, Bld: 92 mg/dL (ref 65–99)
Potassium: 3.6 mmol/L (ref 3.5–5.1)
SODIUM: 141 mmol/L (ref 135–145)

## 2017-12-04 MED ORDER — NICOTINE 14 MG/24HR TD PT24: 14.0000 mg | MEDICATED_PATCH | Freq: Every day | TRANSDERMAL | 0 refills | Status: DC

## 2017-12-04 MED ORDER — AMLODIPINE BESYLATE 2.5 MG PO TABS: 2.5000 mg | ORAL_TABLET | Freq: Every day | ORAL | 1 refills | Status: DC

## 2017-12-04 MED ORDER — POTASSIUM CHLORIDE CRYS ER 20 MEQ PO TBCR
40.0000 meq | EXTENDED_RELEASE_TABLET | Freq: Once | ORAL | Status: AC
  Administered 2017-12-04: 40 meq via ORAL
  Filled 2017-12-04: qty 2

## 2017-12-04 NOTE — Discharge Instructions (Signed)
1) quit smoking as advised 2) maintain adequate hydration by drinking enough fluids 3) hydrochlorothiazide/HCTZ has been discontinued due to concerns about dehydration 4)Take Amlodipinen 2.5 mg daily for blood pressure starting 12/05/2017 5) follow-up with your doctor for blood pressure recheck within a week

## 2017-12-04 NOTE — Progress Notes (Signed)
Orthostatic vital signs  Laying 149/80 with a heart rate of 55  Sitting 144/87 with a heart rate of 53  Standing 148/95 with a heart rate of 63  Roxan Hockey, MD

## 2017-12-04 NOTE — Discharge Summary (Signed)
David Buck, is a 77 y.o. male  DOB February 24, 1941  MRN [id number].  Admission date:  12/02/2017  Admitting Physician  Rise Patience, MD  Discharge Date:  12/04/2017   Primary MD  [name], [name], MD  Recommendations for primary care physician for things to follow:   1) quit smoking as advised 2) maintain adequate hydration by drinking enough fluids 3) hydrochlorothiazide/HCTZ has been discontinued due to concerns about dehydration 4)Take Amlodipinen 2.5 mg daily for blood pressure starting 12/05/2017 5) follow-up with your doctor for blood pressure recheck within a week   Admission Diagnosis  Bradycardia [R00.1] Laceration of forehead, initial encounter [S01.81XA] Syncope, unspecified syncope type [R55] Syncope [R55]   Discharge Diagnosis  Bradycardia [R00.1] Laceration of forehead, initial encounter [S01.81XA] Syncope, unspecified syncope type [R55] Syncope [R55]    Principal Problem:   Syncope Active Problems:   Hypothyroidism   COPD mixed type [facility])   Essential hypertension      Past Medical History:  Diagnosis Date  . Allergy    rhinitis  . COPD (chronic obstructive pulmonary disease) ([place])   . History of hepatitis B    recovered  . Hypertension   . Hypothyroidism   . 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 Meniere's disease   . Osteoarthritis of right knee    Dr Percell Miller  . Pneumonia    hx of 2/17   . Posterior vitreous detachment 2011   R, body- Dr Baird Cancer  . Prostatitis   . Tinnitus     Past Surgical History:  Procedure Laterality Date  . bilateral cataract surgery     . BRONCHOSCOPY  04-14-08   benign  . INGUINAL HERNIA REPAIR  2005   Dr Hassell Done- bilateral   . right knee menisc tear repair    . 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   Procedure: RIGHT TOTAL KNEE ARTHROPLASTY;  Surgeon: Gaynelle Arabian, MD;  Location: [facility] ORS;  Service:  Orthopedics;  Laterality: Right;     HPI  from the history and physical done on the day of admission:    Chief Complaint: Loss of consciousness.  HPI: David Buck is a 77 y.o. male with history of hypertension, hypothyroidism, anxiety who was recently started on Lexapro and had taken his first dose yesterday and also had recently been increased on his hydrochlorothiazide and Synthroid 2 weeks ago was brought to the ER after patient had a syncopal episode.  Patient states that since this morning patient was having diaphoresis and was feeling weak.  At around noon time while sitting in front of the computer and when patient stood up felt dizzy and passed out.  Patient wife on arrival found patient on the floor unconscious and had thrown up.  Did not have any incontinence of urine or any seizure-like activities.  EMS was called and patient regained consciousness around few minutes later.  Patient did have a small laceration on his left periorbital area.  Patient was found to be hypotensive and bradycardic  at this point.  Patient states he has been recently stressed with plan to move out of his house.  ED Course: In the ER patient blood pressure improved with fluids.  Patient was having sinus bradycardia with rate around 48 bpm.  Patient on my exam is nonfocal.  CT head is unremarkable.  Patient admitted for further management of syncope.    Hospital Course:     Orthostatic vital signs  Laying 149/80 with a heart rate of 55  Sitting 144/87 with a heart rate of 53  Standing 148/95 with a heart rate of 63  1. Syncope--- suspect due to volume depletion in the setting of decreased oral intake and diuretic use much improved and overall resolved orthostasis with IV hydration- Resolved hypotension, orthostatic vitals as noted above, without significant orthostasis at this time, EEG is without significant abnormalities, echocardiogram with preserved EF of 55 to 60%, no significant aortic  stenosis    2. History of hypertension--- BP meds were on hold due to hypotension and syncopal episode prior to admission,  follow-up with PCP this week for BP recheck    3. 3Hypothyroidism on Synthroid-continue levothyroxine,    4) anxiety disorder-stable, continue Lexapro  5) COPD--stable, no acute COPD exacerbation, smoking cessation strongly advised   Discharge Condition: Stable  Follow UP--- PCP for BP recheck   Diet and Activity recommendation:  As advised  Discharge Instructions    Discharge Instructions    Call MD for:  difficulty breathing, headache or visual disturbances   Complete by:  As directed    Call MD for:  persistant dizziness or light-headedness   Complete by:  As directed    Call MD for:  persistant nausea and vomiting   Complete by:  As directed    Call MD for:  temperature >100.4   Complete by:  As directed    Diet - low sodium heart healthy   Complete by:  As directed    Discharge instructions   Complete by:  As directed    1) quit smoking as advised 2) maintain adequate hydration by drinking enough fluids 3) hydrochlorothiazide/HCTZ has been discontinued due to concerns about dehydration 4)Take Amlodipinen 2.5 mg daily for blood pressure starting 12/05/2017 5) follow-up with your doctor for blood pressure recheck within a week   Increase activity slowly   Complete by:  As directed         Discharge Medications     Allergies as of 12/04/2017      Reactions   Cefdinir Rash   REACTION: Rash   Cephalosporins Other (See Comments)   unknown      Medication List    STOP taking these medications   hydrochlorothiazide 12.5 MG capsule Commonly known as:  MICROZIDE   hydrochlorothiazide 25 MG tablet Commonly known as:  HYDRODIURIL     TAKE these medications   amLODipine 2.5 MG tablet Commonly known as:  NORVASC Take 1 tablet (2.5 mg total) by mouth daily.   aspirin EC 81 MG tablet Take 81 mg by mouth daily.     Azelastine-Fluticasone 137-50 MCG/ACT Susp Commonly known as:  DYMISTA Place 1 puff into the nose daily.   Cholecalciferol 2000 units Tabs Take 1 tablet by mouth daily.   escitalopram 10 MG tablet Commonly known as:  LEXAPRO Take 1 tablet (10 mg total) by mouth daily.   Glucosamine HCl 1000 MG Tabs Take 1 tablet by mouth daily.   levothyroxine 88 MCG tablet Commonly known as:  SYNTHROID, LEVOTHROID Take  1 tablet (88 mcg total) by mouth daily.   loratadine 10 MG tablet Commonly known as:  CLARITIN Take 10 mg by mouth daily.   multivitamin tablet Take 1 tablet by mouth daily.   nicotine 14 mg/24hr patch Commonly known as:  NICODERM CQ - dosed in mg/24 hours Place 1 patch (14 mg total) onto the skin daily. Start taking on:  12/05/2017   saw palmetto 160 MG capsule Take 160 mg by mouth daily.   vitamin C 500 MG tablet Commonly known as:  ASCORBIC ACID Take 500 mg by mouth daily.       Major procedures and Radiology Reports - PLEASE review detailed and final reports for all details, in brief   Ct Head Wo Contrast  Result Date: 12/02/2017 CLINICAL DATA:  Syncope. EXAM: CT HEAD WITHOUT CONTRAST TECHNIQUE: Contiguous axial images were obtained from the base of the skull through the vertex without intravenous contrast. COMPARISON:  None. FINDINGS: Brain: There is no evidence for acute hemorrhage, hydrocephalus, mass lesion, or abnormal extra-axial fluid collection. No definite CT evidence for acute infarction. Diffuse loss of parenchymal volume is consistent with atrophy. Vascular: No hyperdense vessel or unexpected calcification. Skull: No evidence for fracture. No worrisome lytic or sclerotic lesion. Sinuses/Orbits: The visualized paranasal sinuses and mastoid air cells are clear. Visualized portions of the globes and intraorbital fat are unremarkable. Other: None. IMPRESSION: 1. No acute intracranial abnormality. 2. Diffuse atrophy. Electronically Signed   By: Misty Stanley  M.D.   On: 12/02/2017 20:24    Micro Results   No results found for this or any previous visit (from the past 240 hour(s)).     Today   Subjective    David Buck today has no new concerns, ambulating without dizziness or dyspnea on exertion,          Patient has been seen and examined prior to discharge   Objective   Blood pressure 130/79, pulse 64, temperature 98.2 F (36.8 C), temperature source Oral, resp. rate 16, height 6\' 2"  (1.88 m), weight 75.5 kg (166 lb 7.2 oz), SpO2 97 %.   Intake/Output Summary (Last 24 hours) at 12/04/2017 0941 Last data filed at 12/04/2017 0646 Gross per 24 hour  Intake 1500 ml  Output -  Net 1500 ml    Exam Gen:- Awake Alert,  In no apparent distress  HEENT:- [place].AT, No sclera icterus Neck-Supple Neck,No JVD,.  Lungs-  CTAB , good air movement CV- S1, S2 normal Abd-  +ve B.Sounds, Abd Soft, No tenderness,    Extremity/Skin:- No  edema,   good pulses Psych-affect is appropriate, oriented x3 Neuro-no new focal deficits, no tremors   Data Review   CBC w Diff:  Lab Results  Component Value Date   WBC 5.8 12/04/2017   HGB 11.2 (L) 12/04/2017   HCT 35.0 (L) 12/04/2017   PLT 200 12/04/2017   LYMPHOPCT 23.7 10/31/2017   MONOPCT 8.8 10/31/2017   EOSPCT 8.7 (H) 10/31/2017   BASOPCT 1.1 10/31/2017    CMP:  Lab Results  Component Value Date   NA 139 12/03/2017   K 3.4 (L) 12/03/2017   CL 105 12/03/2017   CO2 26 12/03/2017   BUN 18 12/03/2017   CREATININE 0.87 12/03/2017   PROT 6.8 10/31/2017   ALBUMIN 4.3 10/31/2017   BILITOT 0.7 10/31/2017   ALKPHOS 63 10/31/2017   AST 18 10/31/2017   ALT 16 10/31/2017  .   Total Discharge time is about 33 minutes  Barrett M.D  on 12/04/2017 at 9:41 AM  [facility]   Office  [phone]  Voice Recognition Viviann Spare dictation system was used to create this note, attempts have been made to correct errors. Please contact the author with questions and/or  clarifications.

## 2017-12-04 NOTE — Progress Notes (Signed)
David Buck to be D/C'd Home per MD order.  Discussed with the patient and all questions fully answered.  VSS, Skin clean, dry and intact without evidence of skin break down, no evidence of skin tears noted. IV catheter discontinued intact. Site without signs and symptoms of complications. Dressing and pressure applied.  An After Visit Summary was printed and given to the patient. Patient received prescription.  D/c education completed with patient/family including follow up instructions, medication list, d/c activities limitations if indicated, with other d/c instructions as indicated by MD - patient able to verbalize understanding, all questions fully answered.   Patient instructed to return to ED, call 911, or call MD for any changes in condition.   Patient waiting on his wife to come pick him up. Will continue to assess.  [name] 12/04/2017 11:28 AM

## 2017-12-04 NOTE — Progress Notes (Signed)
Pt's wife arrived and pt D/C'd to exit via wheelchair.

## 2017-12-05 ENCOUNTER — Telehealth: Payer: Self-pay | Admitting: *Deleted

## 2017-12-05 NOTE — Telephone Encounter (Signed)
Called pt to verify appt that was schedule for tomorrow at 2:00 w/Dr. [name] for f/u on BP. Pt states yes his wife made the appt. Inform will change to hosp f/u instead, and needed to ask some additional; questions concerning discharge. Completed TCM call below.David Buck  Transition Care Management Follow-up Telephone Call   Date discharged? 12/04/17   How have you been since you were released from the hospital? Pt states he seem to be doing fine today   Do you understand why you were in the hospital? YES   Do you understand the discharge instructions? YES   Where were you discharged to? Home   Items Reviewed:  Medications reviewed: YES  Allergies reviewed: YES  Dietary changes reviewed: NO  Referrals reviewed: No referral needed   Functional Questionnaire:   Activities of Daily Living (ADLs):   He states he are independent in the following: ambulation, bathing and hygiene, feeding, continence, grooming, toileting and dressing States he doesn't require assistance    Any transportation issues/concerns?: NO   Any patient concerns? NO   Confirmed importance and date/time of follow-up visits scheduled YES, appt 12/06/17  Provider Appointment booked with Dr. Alain Buck  Confirmed with patient if condition begins to worsen call PCP or go to the ER.  Patient was given the office number and encouraged to call back with question or concerns.  : YES

## 2017-12-06 ENCOUNTER — Telehealth: Payer: Self-pay | Admitting: Acute Care

## 2017-12-06 ENCOUNTER — Encounter: Payer: Self-pay | Admitting: Internal Medicine

## 2017-12-06 ENCOUNTER — Ambulatory Visit (INDEPENDENT_AMBULATORY_CARE_PROVIDER_SITE_OTHER): Payer: Medicare Other | Admitting: Internal Medicine

## 2017-12-06 DIAGNOSIS — I10 Essential (primary) hypertension: Secondary | ICD-10-CM | POA: Diagnosis not present

## 2017-12-06 DIAGNOSIS — E034 Atrophy of thyroid (acquired): Secondary | ICD-10-CM

## 2017-12-06 DIAGNOSIS — R55 Syncope and collapse: Secondary | ICD-10-CM

## 2017-12-06 NOTE — Assessment & Plan Note (Addendum)
Cardiol ref Off meds -not taking amlodipine.  Lexapro was stopped after the first dose.  HCTZ was discontinued.  Blood pressure is normal at home.

## 2017-12-06 NOTE — Progress Notes (Signed)
Subjective:  Patient ID: David Buck, male    DOB: 02/19/1941  Age: 77 y.o. MRN: [id number]  CC: No chief complaint on file.   HPI David Buck presents for hosp stay - adm 6/10 for syncope - Lexapro was new - he took 1 tab prior His daughter David Buck on the phone-face time C/o fatigue  They are moving to [place] in Dec   Outpatient Medications Prior to Visit  Medication Sig Dispense Refill  . aspirin EC 81 MG tablet Take 81 mg by mouth daily.    . Azelastine-Fluticasone (DYMISTA) 137-50 MCG/ACT SUSP Place 1 puff into the nose daily. 1 Bottle 0  . Cholecalciferol 2000 units TABS Take 1 tablet by mouth daily.    . Glucosamine HCl 1000 MG TABS Take 1 tablet by mouth daily.    Marland Kitchen levothyroxine (SYNTHROID, LEVOTHROID) 88 MCG tablet Take 1 tablet (88 mcg total) by mouth daily. 90 tablet 3  . loratadine (CLARITIN) 10 MG tablet Take 10 mg by mouth daily.     . Multiple Vitamin (MULTIVITAMIN) tablet Take 1 tablet by mouth daily.    . nicotine (NICODERM CQ - DOSED IN MG/24 HOURS) 14 mg/24hr patch Place 1 patch (14 mg total) onto the skin daily. 28 patch 0  . saw palmetto 160 MG capsule Take 160 mg by mouth daily.    . vitamin C (ASCORBIC ACID) 500 MG tablet Take 500 mg by mouth daily.    Marland Kitchen amLODipine (NORVASC) 2.5 MG tablet Take 1 tablet (2.5 mg total) by mouth daily. (Patient not taking: Reported on 12/06/2017) 30 tablet 1  . escitalopram (LEXAPRO) 10 MG tablet Take 1 tablet (10 mg total) by mouth daily. (Patient not taking: Reported on 12/06/2017) 30 tablet 2   No facility-administered medications prior to visit.     ROS: Review of Systems  Objective:  BP 136/82 (BP Location: Left Arm, Patient Position: Sitting, Cuff Size: Normal)   Pulse 67   Temp 97.7 F (36.5 C) (Oral)   Ht 6\' 2"  (1.88 m)   Wt 171 lb (77.6 kg)   SpO2 99%   BMI 21.96 kg/m   BP Readings from Last 3 Encounters:  12/06/17 136/82  12/04/17 130/79  11/26/17 138/78    Wt Readings from Last 3  Encounters:  12/06/17 171 lb (77.6 kg)  12/04/17 166 lb 7.2 oz (75.5 kg)  11/26/17 167 lb 1.3 oz (75.8 kg)    Physical Exam  Lab Results  Component Value Date   WBC 5.8 12/04/2017   HGB 11.2 (L) 12/04/2017   HCT 35.0 (L) 12/04/2017   PLT 200 12/04/2017   GLUCOSE 92 12/04/2017   CHOL 128 10/31/2017   TRIG 45.0 10/31/2017   HDL 56.90 10/31/2017   LDLCALC 63 10/31/2017   ALT 16 10/31/2017   AST 18 10/31/2017   NA 141 12/04/2017   K 3.6 12/04/2017   CL 109 12/04/2017   CREATININE 0.92 12/04/2017   BUN 23 (H) 12/04/2017   CO2 26 12/04/2017   TSH 1.122 12/02/2017   PSA 2.84 10/31/2017   INR 1.04 01/06/2016   HGBA1C 5.8 10/26/2010    Ct Head Wo Contrast  Result Date: 12/02/2017 CLINICAL DATA:  Syncope. EXAM: CT HEAD WITHOUT CONTRAST TECHNIQUE: Contiguous axial images were obtained from the base of the skull through the vertex without intravenous contrast. COMPARISON:  None. FINDINGS: Brain: There is no evidence for acute hemorrhage, hydrocephalus, mass lesion, or abnormal extra-axial fluid collection. No definite CT evidence for acute  infarction. Diffuse loss of parenchymal volume is consistent with atrophy. Vascular: No hyperdense vessel or unexpected calcification. Skull: No evidence for fracture. No worrisome lytic or sclerotic lesion. Sinuses/Orbits: The visualized paranasal sinuses and mastoid air cells are clear. Visualized portions of the globes and intraorbital fat are unremarkable. Other: None. IMPRESSION: 1. No acute intracranial abnormality. 2. Diffuse atrophy. Electronically Signed   By: Misty Stanley M.D.   On: 12/02/2017 20:24    Assessment & Plan:   There are no diagnoses linked to this encounter.   No orders of the defined types were placed in this encounter.    Follow-up: No follow-ups on file.  Walker Kehr, MD

## 2017-12-06 NOTE — Patient Instructions (Addendum)
Valerian root for anxiety Restart HCTZ if BP is high

## 2017-12-08 ENCOUNTER — Encounter: Payer: Self-pay | Admitting: Internal Medicine

## 2017-12-08 NOTE — Assessment & Plan Note (Signed)
HCTZ was stopped.  David Buck is not taking amlodipine.  Cardiology consultation.  Blood pressure has been normal at home

## 2017-12-08 NOTE — Assessment & Plan Note (Signed)
On Levothroid 

## 2017-12-10 ENCOUNTER — Ambulatory Visit (INDEPENDENT_AMBULATORY_CARE_PROVIDER_SITE_OTHER): Payer: Medicare Other | Admitting: Cardiovascular Disease

## 2017-12-10 ENCOUNTER — Encounter: Payer: Self-pay | Admitting: Cardiovascular Disease

## 2017-12-10 VITALS — BP 132/78 | HR 75 | Wt 168.0 lb

## 2017-12-10 DIAGNOSIS — I10 Essential (primary) hypertension: Secondary | ICD-10-CM

## 2017-12-10 DIAGNOSIS — R55 Syncope and collapse: Secondary | ICD-10-CM

## 2017-12-10 NOTE — Telephone Encounter (Signed)
Routing message to Denise for her to follow up on. 

## 2017-12-10 NOTE — Patient Instructions (Signed)

## 2017-12-10 NOTE — Assessment & Plan Note (Signed)
History of essential hypertension on hydrochlorothiazide in the past which was discontinued at the time of his syncopal event on 12/02/2017.  His blood pressure has been running in the 07/25/1948 range.  I would agree with beginning amlodipine 2.5 mg a day.  This will be followed by Dr. Alain Marion as an outpatient.

## 2017-12-10 NOTE — Progress Notes (Signed)
12/10/2017 David Buck   [birth year]  [id number]  Primary Physician [name], [name], MD Primary Cardiologist: Lorretta Harp MD Lupe Carney, [place]  HPI:  David Buck is a 77 y.o. thin and fit appearing father of 2 daughters accompanied by his wife [name] today.  He was referred by Dr. Alain Marion for cardiovascular evaluation because of a recent episode of syncope.  His only risk factors include treated hypertension on hydrochlorothiazide as well as the pack years of tobacco abuse continuing to smoke 1 pack/day.  Never had a heart attack or stroke.  He denies chest pain or shortness of breath.  He had a syncopal episode on 12/02/2017 after having increase his hydrochlorothiazide from 12.5 to 25 mg a day and beginning Lexapro.  When EMS was called he was hypotensive and bradycardic suggesting a vagal component.  He was also nauseated.  These drugs were discontinued.  He said no further episodes.   No outpatient medications have been marked as taking for the 12/10/17 encounter (Office Visit) with Lorretta Harp, MD.     Allergies  Allergen Reactions  . Cefdinir Rash    REACTION: Rash  . Cephalosporins Other (See Comments)    unknown    Social History   Socioeconomic History  . Marital status: Married    Spouse name: Not on file  . Number of children: Not on file  . Years of education: Not on file  . Highest education level: Not on file  Occupational History  . Not on file  Social Needs  . Financial resource strain: Not on file  . Food insecurity:    Worry: Not on file    Inability: Not on file  . Transportation needs:    Medical: Not on file    Non-medical: Not on file  Tobacco Use  . Smoking status: Current Every Day Smoker    Packs/day: 1.00    Years: 50.00    Pack years: 50.00    Types: Cigarettes  . Smokeless tobacco: Never Used  Substance and Sexual Activity  . Alcohol use: Yes    Comment: occasional   . Drug use: No  . Sexual activity:  Not on file  Lifestyle  . Physical activity:    Days per week: Not on file    Minutes per session: Not on file  . Stress: Not on file  Relationships  . Social connections:    Talks on phone: Not on file    Gets together: Not on file    Attends religious service: Not on file    Active member of club or organization: Not on file    Attends meetings of clubs or organizations: Not on file    Relationship status: Not on file  . Intimate partner violence:    Fear of current or ex partner: Not on file    Emotionally abused: Not on file    Physically abused: Not on file    Forced sexual activity: Not on file  Other Topics Concern  . Not on file  Social History Narrative  . Not on file     Review of Systems: General: negative for chills, fever, night sweats or weight changes.  Cardiovascular: negative for chest pain, dyspnea on exertion, edema, orthopnea, palpitations, paroxysmal nocturnal dyspnea or shortness of breath Dermatological: negative for rash Respiratory: negative for cough or wheezing Urologic: negative for hematuria Abdominal: negative for nausea, vomiting, diarrhea, bright red blood per rectum, melena, or hematemesis Neurologic: negative  for visual changes, syncope, or dizziness All other systems reviewed and are otherwise negative except as noted above.    Blood pressure 132/78, pulse 75, weight 168 lb (76.2 kg).  General appearance: alert and no distress Neck: no adenopathy, no carotid bruit, no JVD, supple, symmetrical, trachea midline and thyroid not enlarged, symmetric, no tenderness/mass/nodules Lungs: clear to auscultation bilaterally Heart: regular rate and rhythm, S1, S2 normal, no murmur, click, rub or gallop Extremities: extremities normal, atraumatic, no cyanosis or edema Pulses: 2+ and symmetric Skin: Skin color, texture, turgor normal. No rashes or lesions Neurologic: Alert and oriented X 3, normal strength and tone. Normal symmetric reflexes. Normal  coordination and gait  EKG not performed today  ASSESSMENT AND PLAN:   Essential hypertension History of essential hypertension on hydrochlorothiazide in the past which was discontinued at the time of his syncopal event on 12/02/2017.  His blood pressure has been running in the 07/25/1948 range.  I would agree with beginning amlodipine 2.5 mg a day.  This will be followed by Dr. Alain Marion as an outpatient.  Syncope Recent episode of syncope on 12/02/2017 after increasing his dose of hydrochlorothiazide from 12.5 to 25 mg a day and beginning Lexapro he was found by his wife.  He did lacerate his forehead.  When EMS arrived his blood pressure was in the 23F systolic and his heart rate was in the 40s.  This sounds vagal to me.  He said no further episodes.  I did counsel him about not driving for 6 months.  2D echo was normal.  I do not think he needs an event monitor at this time unless he has a recurrent episode.      Lorretta Harp MD FACP,FACC,FAHA, [facility] 12/10/2017 3:22 PM

## 2017-12-10 NOTE — Telephone Encounter (Signed)
Spoke with pt's wife and advised that we will cancel referral for pt .  Also advised that if he decides at a later time to schedule he will need to do so prior to turning 79.  She verbalized understanding.  Nothing further needed.

## 2017-12-10 NOTE — Assessment & Plan Note (Signed)
Recent episode of syncope on 12/02/2017 after increasing his dose of hydrochlorothiazide from 12.5 to 25 mg a day and beginning Lexapro he was found by his wife.  He did lacerate his forehead.  When EMS arrived his blood pressure was in the 48L systolic and his heart rate was in the 40s.  This sounds vagal to me.  He said no further episodes.  I did counsel him about not driving for 6 months.  2D echo was normal.  I do not think he needs an event monitor at this time unless he has a recurrent episode.

## 2017-12-18 ENCOUNTER — Ambulatory Visit: Payer: Medicare Other | Admitting: Internal Medicine

## 2017-12-27 ENCOUNTER — Ambulatory Visit: Payer: Medicare Other | Admitting: Internal Medicine

## 2018-01-21 ENCOUNTER — Ambulatory Visit (INDEPENDENT_AMBULATORY_CARE_PROVIDER_SITE_OTHER): Payer: Medicare Other | Admitting: Internal Medicine

## 2018-01-21 ENCOUNTER — Encounter: Payer: Self-pay | Admitting: Internal Medicine

## 2018-01-21 VITALS — BP 122/70 | HR 75 | Temp 98.1°F | Ht 74.0 in | Wt 167.0 lb

## 2018-01-21 DIAGNOSIS — R634 Abnormal weight loss: Secondary | ICD-10-CM

## 2018-01-21 DIAGNOSIS — H9319 Tinnitus, unspecified ear: Secondary | ICD-10-CM | POA: Insufficient documentation

## 2018-01-21 DIAGNOSIS — R911 Solitary pulmonary nodule: Secondary | ICD-10-CM

## 2018-01-21 DIAGNOSIS — E034 Atrophy of thyroid (acquired): Secondary | ICD-10-CM | POA: Diagnosis not present

## 2018-01-21 DIAGNOSIS — R001 Bradycardia, unspecified: Secondary | ICD-10-CM | POA: Diagnosis not present

## 2018-01-21 DIAGNOSIS — J449 Chronic obstructive pulmonary disease, unspecified: Secondary | ICD-10-CM

## 2018-01-21 DIAGNOSIS — R7309 Other abnormal glucose: Secondary | ICD-10-CM | POA: Diagnosis not present

## 2018-01-21 DIAGNOSIS — R5382 Chronic fatigue, unspecified: Secondary | ICD-10-CM

## 2018-01-21 DIAGNOSIS — D649 Anemia, unspecified: Secondary | ICD-10-CM

## 2018-01-21 DIAGNOSIS — H9313 Tinnitus, bilateral: Secondary | ICD-10-CM | POA: Diagnosis not present

## 2018-01-21 MED ORDER — ACLIDINIUM BROMIDE 400 MCG/ACT IN AEPB: 1.0000 | INHALATION_SPRAY | Freq: Two times a day (BID) | RESPIRATORY_TRACT | 11 refills | Status: DC

## 2018-01-21 MED ORDER — AMLODIPINE BESYLATE 2.5 MG PO TABS: 2.5000 mg | ORAL_TABLET | Freq: Every day | ORAL | 11 refills | Status: DC

## 2018-01-21 NOTE — Assessment & Plan Note (Signed)
Labs

## 2018-01-21 NOTE — Progress Notes (Signed)
Subjective:  Patient ID: David Buck, male    DOB: Oct 16, 1940  Age: 77 y.o. MRN: [id number]  CC: No chief complaint on file.   HPI David Buck presents for a lesser stamina/indurance [name] was moving dirt outside for several days. He thinks his endurance that he should have. He thinks it started in Feb 2019. The family is worried about lung cancer C/o wt loss    Outpatient Medications Prior to Visit  Medication Sig Dispense Refill  . amLODipine (NORVASC) 2.5 MG tablet Take 2.5 mg by mouth daily.    Marland Kitchen aspirin EC 81 MG tablet Take 81 mg by mouth daily.    . Cholecalciferol 2000 units TABS Take 1 tablet by mouth daily.    . Glucosamine HCl 1000 MG TABS Take 1 tablet by mouth daily.    Marland Kitchen levothyroxine (SYNTHROID, LEVOTHROID) 88 MCG tablet Take 1 tablet (88 mcg total) by mouth daily. 90 tablet 3  . loratadine (CLARITIN) 10 MG tablet Take 10 mg by mouth daily.     . Multiple Vitamin (MULTIVITAMIN) tablet Take 1 tablet by mouth daily.    . saw palmetto 160 MG capsule Take 160 mg by mouth daily.    . vitamin C (ASCORBIC ACID) 500 MG tablet Take 500 mg by mouth daily.     No facility-administered medications prior to visit.     ROS: Review of Systems  Constitutional: Positive for fatigue. Negative for appetite change and unexpected weight change.  HENT: Negative for congestion, nosebleeds, sneezing, sore throat and trouble swallowing.   Eyes: Negative for itching and visual disturbance.  Respiratory: Negative for cough.   Cardiovascular: Negative for chest pain, palpitations and leg swelling.  Gastrointestinal: Negative for abdominal distention, blood in stool, diarrhea and nausea.  Genitourinary: Negative for frequency and hematuria.  Musculoskeletal: Negative for back pain, gait problem, joint swelling and neck pain.  Skin: Negative for rash.  Neurological: Negative for dizziness, tremors, speech difficulty and weakness.  Psychiatric/Behavioral: Negative for agitation,  dysphoric mood, sleep disturbance and suicidal ideas. The patient is not nervous/anxious.     Objective:  BP 122/70 (BP Location: Left Arm, Patient Position: Sitting, Cuff Size: Normal)   Pulse 75   Temp 98.1 F (36.7 C) (Oral)   Ht 6\' 2"  (1.88 m)   Wt 167 lb (75.8 kg)   SpO2 96%   BMI 21.44 kg/m   BP Readings from Last 3 Encounters:  01/21/18 122/70  12/10/17 132/78  12/06/17 136/82    Wt Readings from Last 3 Encounters:  01/21/18 167 lb (75.8 kg)  12/10/17 168 lb (76.2 kg)  12/06/17 171 lb (77.6 kg)    Physical Exam  Constitutional: He is oriented to person, place, and time. He appears well-developed. No distress.  NAD  HENT:  Mouth/Throat: Oropharynx is clear and moist.  Eyes: Pupils are equal, round, and reactive to light. Conjunctivae are normal.  Neck: Normal range of motion. No JVD present. No thyromegaly present.  Cardiovascular: Normal rate, regular rhythm, normal heart sounds and intact distal pulses. Exam reveals no gallop and no friction rub.  No murmur heard. Pulmonary/Chest: Effort normal and breath sounds normal. No respiratory distress. He has no wheezes. He has no rales. He exhibits no tenderness.  Abdominal: Soft. Bowel sounds are normal. He exhibits no distension and no mass. There is no tenderness. There is no rebound and no guarding.  Musculoskeletal: Normal range of motion. He exhibits no edema or tenderness.  Lymphadenopathy:    He has  no cervical adenopathy.  Neurological: He is alert and oriented to person, place, and time. He has normal reflexes. No cranial nerve deficit. He exhibits normal muscle tone. He displays a negative Romberg sign. Coordination and gait normal.  Skin: Skin is warm and dry. No rash noted.  Psychiatric: He has a normal mood and affect. His behavior is normal. Judgment and thought content normal.    Lab Results  Component Value Date   WBC 5.8 12/04/2017   HGB 11.2 (L) 12/04/2017   HCT 35.0 (L) 12/04/2017   PLT 200  12/04/2017   GLUCOSE 92 12/04/2017   CHOL 128 10/31/2017   TRIG 45.0 10/31/2017   HDL 56.90 10/31/2017   LDLCALC 63 10/31/2017   ALT 16 10/31/2017   AST 18 10/31/2017   NA 141 12/04/2017   K 3.6 12/04/2017   CL 109 12/04/2017   CREATININE 0.92 12/04/2017   BUN 23 (H) 12/04/2017   CO2 26 12/04/2017   TSH 1.122 12/02/2017   PSA 2.84 10/31/2017   INR 1.04 01/06/2016   HGBA1C 5.8 10/26/2010    Ct Head Wo Contrast  Result Date: 12/02/2017 CLINICAL DATA:  Syncope. EXAM: CT HEAD WITHOUT CONTRAST TECHNIQUE: Contiguous axial images were obtained from the base of the skull through the vertex without intravenous contrast. COMPARISON:  None. FINDINGS: Brain: There is no evidence for acute hemorrhage, hydrocephalus, mass lesion, or abnormal extra-axial fluid collection. No definite CT evidence for acute infarction. Diffuse loss of parenchymal volume is consistent with atrophy. Vascular: No hyperdense vessel or unexpected calcification. Skull: No evidence for fracture. No worrisome lytic or sclerotic lesion. Sinuses/Orbits: The visualized paranasal sinuses and mastoid air cells are clear. Visualized portions of the globes and intraorbital fat are unremarkable. Other: None. IMPRESSION: 1. No acute intracranial abnormality. 2. Diffuse atrophy. Electronically Signed   By: Misty Stanley M.D.   On: 12/02/2017 20:24    Assessment & Plan:   There are no diagnoses linked to this encounter.   No orders of the defined types were placed in this encounter.    Follow-up: No follow-ups on file.  Walker Kehr, MD

## 2018-01-21 NOTE — Assessment & Plan Note (Signed)
Offered to try an inhaler - Caprice Renshaw

## 2018-01-21 NOTE — Assessment & Plan Note (Signed)
CT chest

## 2018-01-21 NOTE — Assessment & Plan Note (Addendum)
Labs Treat COPD

## 2018-01-21 NOTE — Assessment & Plan Note (Signed)
Wt Readings from Last 3 Encounters:  01/21/18 167 lb (75.8 kg)  12/10/17 168 lb (76.2 kg)  12/06/17 171 lb (77.6 kg)

## 2018-01-21 NOTE — Assessment & Plan Note (Signed)
ENT ref 

## 2018-01-21 NOTE — Addendum Note (Signed)
Addended by: Cassandria Anger on: 01/21/2018 05:00 PM   Modules accepted: Orders

## 2018-01-21 NOTE — Assessment & Plan Note (Signed)
HR is reasonable

## 2018-01-23 ENCOUNTER — Other Ambulatory Visit (INDEPENDENT_AMBULATORY_CARE_PROVIDER_SITE_OTHER): Payer: Medicare Other

## 2018-01-23 DIAGNOSIS — R001 Bradycardia, unspecified: Secondary | ICD-10-CM | POA: Diagnosis not present

## 2018-01-23 DIAGNOSIS — E034 Atrophy of thyroid (acquired): Secondary | ICD-10-CM

## 2018-01-23 DIAGNOSIS — R5382 Chronic fatigue, unspecified: Secondary | ICD-10-CM

## 2018-01-23 DIAGNOSIS — D649 Anemia, unspecified: Secondary | ICD-10-CM | POA: Diagnosis not present

## 2018-01-23 DIAGNOSIS — R911 Solitary pulmonary nodule: Secondary | ICD-10-CM

## 2018-01-23 LAB — HEPATIC FUNCTION PANEL
ALT: 18 U/L (ref 0–53)
AST: 17 U/L (ref 0–37)
Albumin: 4.3 g/dL (ref 3.5–5.2)
Alkaline Phosphatase: 61 U/L (ref 39–117)
BILIRUBIN DIRECT: 0.1 mg/dL (ref 0.0–0.3)
BILIRUBIN TOTAL: 0.6 mg/dL (ref 0.2–1.2)
Total Protein: 6.9 g/dL (ref 6.0–8.3)

## 2018-01-23 LAB — CBC WITH DIFFERENTIAL/PLATELET
BASOS ABS: 0.1 10*3/uL (ref 0.0–0.1)
Basophils Relative: 0.9 % (ref 0.0–3.0)
EOS ABS: 0.4 10*3/uL (ref 0.0–0.7)
Eosinophils Relative: 6.6 % — ABNORMAL HIGH (ref 0.0–5.0)
HCT: 39.9 % (ref 39.0–52.0)
Hemoglobin: 13.5 g/dL (ref 13.0–17.0)
LYMPHS ABS: 1.5 10*3/uL (ref 0.7–4.0)
Lymphocytes Relative: 22.6 % (ref 12.0–46.0)
MCHC: 33.8 g/dL (ref 30.0–36.0)
MCV: 94.7 fl (ref 78.0–100.0)
MONO ABS: 0.6 10*3/uL (ref 0.1–1.0)
MONOS PCT: 9.1 % (ref 3.0–12.0)
NEUTROS PCT: 60.8 % (ref 43.0–77.0)
Neutro Abs: 3.9 10*3/uL (ref 1.4–7.7)
Platelets: 249 10*3/uL (ref 150.0–400.0)
RBC: 4.21 Mil/uL — AB (ref 4.22–5.81)
RDW: 13 % (ref 11.5–15.5)
WBC: 6.4 10*3/uL (ref 4.0–10.5)

## 2018-01-23 LAB — BASIC METABOLIC PANEL
BUN: 26 mg/dL — ABNORMAL HIGH (ref 6–23)
CALCIUM: 9.7 mg/dL (ref 8.4–10.5)
CO2: 23 mEq/L (ref 19–32)
CREATININE: 0.98 mg/dL (ref 0.40–1.50)
Chloride: 105 mEq/L (ref 96–112)
GFR: 79.04 mL/min (ref >60.00)
Glucose, Bld: 104 mg/dL — ABNORMAL HIGH (ref 70–99)
POTASSIUM: 4 meq/L (ref 3.5–5.1)
Sodium: 137 mEq/L (ref 135–145)

## 2018-01-23 LAB — TSH: TSH: 3.54 u[IU]/mL (ref 0.35–4.50)

## 2018-01-23 LAB — TESTOSTERONE: Testosterone: 365.44 ng/dL (ref 300.00–890.00)

## 2018-01-23 LAB — T3, FREE: T3, Free: 2.9 pg/mL (ref 2.3–4.2)

## 2018-01-23 LAB — T4, FREE: FREE T4: 1.04 ng/dL (ref 0.60–1.60)

## 2018-01-23 LAB — CORTISOL: CORTISOL PLASMA: 8.9 ug/dL

## 2018-01-24 LAB — IRON,TIBC AND FERRITIN PANEL
%SAT: 28 % (calc) (ref 20–48)
Ferritin: 84 ng/mL (ref 24–380)
Iron: 90 ug/dL (ref 50–180)
TIBC: 324 ug/dL (ref 250–425)

## 2018-01-27 DIAGNOSIS — Z0279 Encounter for issue of other medical certificate: Secondary | ICD-10-CM

## 2018-01-31 ENCOUNTER — Telehealth: Payer: Self-pay | Admitting: Internal Medicine

## 2018-01-31 ENCOUNTER — Ambulatory Visit (INDEPENDENT_AMBULATORY_CARE_PROVIDER_SITE_OTHER)
Admission: RE | Admit: 2018-01-31 | Discharge: 2018-01-31 | Disposition: A | Discharge status: Home / Self Care | Payer: Medicare Other | Source: Ambulatory Visit | Attending: Internal Medicine | Admitting: Internal Medicine

## 2018-01-31 DIAGNOSIS — E034 Atrophy of thyroid (acquired): Secondary | ICD-10-CM | POA: Diagnosis not present

## 2018-01-31 DIAGNOSIS — J439 Emphysema, unspecified: Secondary | ICD-10-CM | POA: Diagnosis not present

## 2018-01-31 DIAGNOSIS — J449 Chronic obstructive pulmonary disease, unspecified: Secondary | ICD-10-CM

## 2018-01-31 DIAGNOSIS — R5382 Chronic fatigue, unspecified: Secondary | ICD-10-CM

## 2018-01-31 MED ORDER — IOPAMIDOL (ISOVUE-300) INJECTION 61%
80.0000 mL | Freq: Once | INTRAVENOUS | Status: AC | PRN
  Administered 2018-01-31: 80 mL via INTRAVENOUS

## 2018-01-31 NOTE — Telephone Encounter (Signed)
Erroneous encounter

## 2018-02-03 ENCOUNTER — Ambulatory Visit (INDEPENDENT_AMBULATORY_CARE_PROVIDER_SITE_OTHER): Payer: Medicare Other | Admitting: Internal Medicine

## 2018-02-03 ENCOUNTER — Encounter: Payer: Self-pay | Admitting: Internal Medicine

## 2018-02-03 DIAGNOSIS — F17200 Nicotine dependence, unspecified, uncomplicated: Secondary | ICD-10-CM

## 2018-02-03 DIAGNOSIS — R911 Solitary pulmonary nodule: Secondary | ICD-10-CM

## 2018-02-03 DIAGNOSIS — J449 Chronic obstructive pulmonary disease, unspecified: Secondary | ICD-10-CM | POA: Diagnosis not present

## 2018-02-03 NOTE — Progress Notes (Signed)
Subjective:    Patient ID: David Buck, male    DOB: [birth year], 77 y.o.   MRN: [id number]    M 1 PPD smoker followed for small lung nodules/ RUL nodular scarring/ benign bronchoscopy 04/14/08- neg AF[employer]. 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 ------------------------------------------------------------------------------------  08/01/17- 77 year old male one pack per day smoker followed for small lung nodules/right upper lobe nodular scarring/benign bronchoscopy 27/78/2423-[id number] [employer], complicated by vasomotor rhinitis,  ----Pt states he has been doing good. Denies any complaints or concerns. He expects to stop smoking in the next year because they are moving to [place], where smoking is banned. Denies coughing but complains of postnasal drip for which he has loratadine-not being taken now. Denies adenopathy, blood, night sweats, pain.  02/03/2018- 77 year old male one pack per day smoker followed for small lung nodules/right upper lobe nodular scarring/benign bronchoscopy 54/00/8676-[id number] [employer], complicated by vasomotor rhinitis, -----Patient was told to come in by Dr. Alain Marion due to most recent CT scan. He had the Ct due to recent fatigued and weight loss.   Wife is here. [place],  Weight is down about 6 pounds over the last year- always lean. He had a flulike illness in February/March treated by PCP with doxycycline which helped, but relapsed.  CXR unremarkable.  Tick bite this spring.  In June had syncopal episode with negative work-up after changing BP med-told he was dehydrated.  CT chest during that evaluation noted changes described below.  I reviewed the report and images with him and his wife. He denies change in chronic mild cough as he continues to smoke.  Clear postnasal drip.  No sweats or fevers.  Denies  chest pain or palpitation, adenopathy or blood. Admits to being a bit stressed as he anticipates moved to [place], partly because it would restrict his smoking. CT chest 01/31/2018- IMPRESSION: Mild pattern of tree-in-bud/centrilobular nodularity and associated endobronchial debris/plugging of all lobes, compatible with infection. Some regions have improved and some are persistent from the comparison CT of 09/21/2015.  ROS-see HPI  + = positive Constitutional:   No-   weight loss, night sweats, fevers, chills, fatigue, lassitude. HEENT:   No-  headaches, difficulty swallowing, tooth/dental problems, sore throat,       No-  sneezing, itching, ear ache, nasal congestion, +post nasal drip,  CV:  No-   chest pain, orthopnea, PND, swelling in lower extremities, anasarca, dizziness, palpitations Resp: No-   shortness of breath with exertion or at rest.              No-   productive cough,  No- non-productive cough,  No- coughing up of blood.              No-   change in color of mucus.  No- wheezing.   Skin: No-   rash or lesions. GI:  No-   heartburn, indigestion, abdominal pain, nausea, vomiting,  GU:  MS:  No-   joint pain or swelling.  Neuro-     nothing unusual Psych:  No- change in mood or affect. No depression or anxiety.  No memory loss.  Objective:   Physical Exam General- Alert, Oriented, Affect-appropriate, Distress- none acute. tall slender man Skin- rash-none, lesions- none, excoriation- none Lymphadenopathy- none Head- atraumatic            Eyes- Gross vision intact, PERRLA, conjunctivae clear secretions  Ears- Hearing, canals normal            Nose- Clear, No- Septal dev, +mucus postnasal drip, polyps, erosion, perforation             Throat- Mallampati II , mucosa clear , drainage- none, tonsils- atrophic  dentures Neck- flexible , trachea midline, no stridor , thyroid nl, carotid no bruit Chest - symmetrical excursion , unlabored           Heart/CV- RRR ,  no murmur , no gallop  , no rub, nl s1 s2                           - JVD- none , edema- none, stasis changes- none, varices- none           Lung- Clear, unlabored, wheeze- none, cough- none , dullness-none, rub- none           Chest wall-  Abd-  Br/ Gen/ Rectal- Not done, not indicated Extrem- cyanosis- none, clubbing, none, atrophy- none, strength- nl Neuro-+ mild head bobbing tremor    Assessment & Plan:

## 2018-02-03 NOTE — Patient Instructions (Addendum)
Order- please schedule future CT chest no contrast in 6 months   Dx bilateral lung nodules < 5 mm  Please call if we can help

## 2018-02-07 NOTE — Assessment & Plan Note (Signed)
Remote cultures for small nodules were negative.  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 Plan-repeat CT chest in 6 months

## 2018-02-07 NOTE — Assessment & Plan Note (Signed)
Anticipate updating PFT in the next couple of years, sooner if symptoms require.  Meanwhile we continue encouragement to stop smoking.

## 2018-02-07 NOTE — Assessment & Plan Note (Signed)
Wife says that an issue with moved to assisted living/[place] that is threatening to him is that he will not be allowed to smoke there.  We regard this is a positive.

## 2018-02-11 DIAGNOSIS — H9113 Presbycusis, bilateral: Secondary | ICD-10-CM | POA: Insufficient documentation

## 2018-02-11 DIAGNOSIS — H9313 Tinnitus, bilateral: Secondary | ICD-10-CM | POA: Diagnosis not present

## 2018-02-11 DIAGNOSIS — H903 Sensorineural hearing loss, bilateral: Secondary | ICD-10-CM | POA: Diagnosis not present

## 2018-02-25 ENCOUNTER — Ambulatory Visit (INDEPENDENT_AMBULATORY_CARE_PROVIDER_SITE_OTHER): Payer: Medicare Other | Admitting: Internal Medicine

## 2018-02-25 ENCOUNTER — Encounter: Payer: Self-pay | Admitting: Internal Medicine

## 2018-02-25 DIAGNOSIS — J449 Chronic obstructive pulmonary disease, unspecified: Secondary | ICD-10-CM

## 2018-02-25 DIAGNOSIS — E034 Atrophy of thyroid (acquired): Secondary | ICD-10-CM

## 2018-02-25 DIAGNOSIS — R5382 Chronic fatigue, unspecified: Secondary | ICD-10-CM

## 2018-02-25 DIAGNOSIS — I10 Essential (primary) hypertension: Secondary | ICD-10-CM

## 2018-02-25 MED ORDER — GABAPENTIN 100 MG PO CAPS: 100.0000 mg | ORAL_CAPSULE | Freq: Three times a day (TID) | ORAL | 3 refills | Status: DC | PRN

## 2018-02-25 NOTE — Assessment & Plan Note (Signed)
BP Readings from Last 3 Encounters:  02/25/18 128/82  02/03/18 116/62  01/21/18 122/70

## 2018-02-25 NOTE — Assessment & Plan Note (Addendum)
Discussed Build stamina  Improve sleep schedule (Goes to bed at 3:30 am and gets up at 12 noon. Nap at night) Treat tinnitus

## 2018-02-25 NOTE — Assessment & Plan Note (Signed)
Dr Annamaria Boots

## 2018-02-25 NOTE — Assessment & Plan Note (Signed)
On Amlodipine 

## 2018-02-25 NOTE — Assessment & Plan Note (Signed)
Levothroid 

## 2018-02-25 NOTE — Progress Notes (Signed)
Subjective:  Patient ID: David Buck, male    DOB: [birth year]  Age: 77 y.o. MRN: [id number]  CC: No chief complaint on file.   HPI David Buck presents for fatigue and wt loss Goes to bed at 3:30 am and gets up at 12 noon. Nap at night F/u tinnitus - saw Dr Constance Holster. [facility] appt is pending  Outpatient Medications Prior to Visit  Medication Sig Dispense Refill  . amLODipine (NORVASC) 2.5 MG tablet Take 1 tablet (2.5 mg total) by mouth daily. 30 tablet 11  . aspirin EC 81 MG tablet Take 81 mg by mouth daily.    . Cholecalciferol 2000 units TABS Take 1 tablet by mouth daily.    . Glucosamine HCl 1000 MG TABS Take 1 tablet by mouth daily.    Marland Kitchen levothyroxine (SYNTHROID, LEVOTHROID) 88 MCG tablet Take 1 tablet (88 mcg total) by mouth daily. 90 tablet 3  . loratadine (CLARITIN) 10 MG tablet Take 10 mg by mouth daily.     . Multiple Vitamin (MULTIVITAMIN) tablet Take 1 tablet by mouth daily.    . saw palmetto 160 MG capsule Take 160 mg by mouth daily.    . vitamin C (ASCORBIC ACID) 500 MG tablet Take 500 mg by mouth daily.    . Aclidinium Bromide (TUDORZA PRESSAIR) 400 MCG/ACT AEPB Inhale 1 Act into the lungs 2 (two) times daily. (Patient not taking: Reported on 02/25/2018) 1 each 11   No facility-administered medications prior to visit.     ROS: Review of Systems  Constitutional: Negative for appetite change, fatigue and unexpected weight change.  HENT: Positive for tinnitus. Negative for congestion, nosebleeds, sneezing, sore throat and trouble swallowing.   Eyes: Negative for itching and visual disturbance.  Respiratory: Negative for cough.   Cardiovascular: Negative for chest pain, palpitations and leg swelling.  Gastrointestinal: Negative for abdominal distention, blood in stool, diarrhea and nausea.  Genitourinary: Negative for frequency and hematuria.  Musculoskeletal: Negative for back pain, gait problem, joint swelling and neck pain.  Skin: Negative for  rash.  Neurological: Negative for dizziness, tremors, speech difficulty and weakness.  Psychiatric/Behavioral: Positive for sleep disturbance. Negative for agitation and dysphoric mood. The patient is not nervous/anxious.     Objective:  BP 128/82 (BP Location: Left Arm, Patient Position: Sitting, Cuff Size: Normal)   Pulse 79   Ht 6\' 2"  (1.88 m)   Wt 166 lb (75.3 kg)   SpO2 95%   BMI 21.31 kg/m   BP Readings from Last 3 Encounters:  02/25/18 128/82  02/03/18 116/62  01/21/18 122/70    Wt Readings from Last 3 Encounters:  02/25/18 166 lb (75.3 kg)  02/03/18 167 lb (75.8 kg)  01/21/18 167 lb (75.8 kg)    Physical Exam  Constitutional: He is oriented to person, place, and time. He appears well-developed. No distress.  NAD  HENT:  Mouth/Throat: Oropharynx is clear and moist.  Eyes: Pupils are equal, round, and reactive to light. Conjunctivae are normal.  Neck: Normal range of motion. No JVD present. No thyromegaly present.  Cardiovascular: Normal rate, regular rhythm, normal heart sounds and intact distal pulses. Exam reveals no gallop and no friction rub.  No murmur heard. Pulmonary/Chest: Effort normal and breath sounds normal. No respiratory distress. He has no wheezes. He has no rales. He exhibits no tenderness.  Abdominal: Soft. Bowel sounds are normal. He exhibits no distension and no mass. There is no tenderness. There is no rebound and no guarding.  Musculoskeletal: Normal range of motion. He exhibits no edema or tenderness.  Lymphadenopathy:    He has no cervical adenopathy.  Neurological: He is alert and oriented to person, place, and time. He has normal reflexes. No cranial nerve deficit. He exhibits normal muscle tone. He displays a negative Romberg sign. Coordination and gait normal.  Skin: Skin is warm and dry. No rash noted.  Psychiatric: He has a normal mood and affect. His behavior is normal. Judgment and thought content normal.    Lab Results  Component  Value Date   WBC 6.4 01/23/2018   HGB 13.5 01/23/2018   HCT 39.9 01/23/2018   PLT 249.0 01/23/2018   GLUCOSE 104 (H) 01/23/2018   CHOL 128 10/31/2017   TRIG 45.0 10/31/2017   HDL 56.90 10/31/2017   LDLCALC 63 10/31/2017   ALT 18 01/23/2018   AST 17 01/23/2018   NA 137 01/23/2018   K 4.0 01/23/2018   CL 105 01/23/2018   CREATININE 0.98 01/23/2018   BUN 26 (H) 01/23/2018   CO2 23 01/23/2018   TSH 3.54 01/23/2018   PSA 2.84 10/31/2017   INR 1.04 01/06/2016   HGBA1C 5.8 10/26/2010    Ct Chest W Contrast  Result Date: 01/31/2018 CLINICAL DATA:  77 year old male with a history of 10 pound weight loss EXAM: CT CHEST WITH CONTRAST TECHNIQUE: Multidetector CT imaging of the chest was performed during intravenous contrast administration. CONTRAST:  93mL ISOVUE-300 IOPAMIDOL (ISOVUE-300) INJECTION 61% COMPARISON:  CT 09/21/2015 FINDINGS: Cardiovascular: Heart size within normal limits. No pericardial fluid/thickening. Calcifications of the left anterior descending, circumflex coronary arteries. Thoracic aorta of normal course caliber and contour. No aneurysm or dissection. Mild atherosclerotic changes. Branch vessels are patent. Pulmonary arteries unremarkable with no filling defects to the proximal subsegmental vessels. Mediastinum/Nodes: No mediastinal adenopathy. Unremarkable course of the thoracic esophagus. Unremarkable thoracic inlet. Lungs/Pleura: Centrilobular and paraseptal emphysema. Tree-in-bud and peribronchial opacities with regions of endobronchial debris/plugging involving right upper lobe, middle lobe, left upper lobe including lingula, right lower lobe, left lower lobe. No pleural effusion. No confluent airspace disease. Overall, some of these regions are unchanged from the comparison CT, while some are new. Upper Abdomen: No acute finding.  Left renal cysts. Musculoskeletal: No acute displaced fracture.  Degenerative changes. IMPRESSION: Mild pattern of tree-in-bud/centrilobular  nodularity and associated endobronchial debris/plugging of all lobes, compatible with infection. Some regions have improved and some are persistent from the comparison CT of 09/21/2015. Given the patient's apparent risk factors, repeat CT can be considered in 12 months. This recommendation follows the consensus statement: Guidelines for Management of Incidental Pulmonary Nodules Detected on CT Images: From the Fleischner Society 2017; Radiology 2017; 284:228-243. Coronary artery disease and associated atherosclerosis of the aorta. Aortic Atherosclerosis (ICD10-I70.0). Electronically Signed   By: Corrie Mckusick D.O.   On: 01/31/2018 17:22    Assessment & Plan:   There are no diagnoses linked to this encounter.   No orders of the defined types were placed in this encounter.    Follow-up: No follow-ups on file.  Walker Kehr, MD

## 2018-04-09 DIAGNOSIS — T1582XA Foreign body in other and multiple parts of external eye, left eye, initial encounter: Secondary | ICD-10-CM | POA: Diagnosis not present

## 2018-04-29 ENCOUNTER — Ambulatory Visit (INDEPENDENT_AMBULATORY_CARE_PROVIDER_SITE_OTHER): Payer: Medicare Other | Admitting: Internal Medicine

## 2018-04-29 ENCOUNTER — Encounter: Payer: Self-pay | Admitting: Internal Medicine

## 2018-04-29 VITALS — BP 140/78 | HR 73 | Temp 98.0°F | Ht 74.0 in | Wt 162.0 lb

## 2018-04-29 DIAGNOSIS — M79642 Pain in left hand: Secondary | ICD-10-CM

## 2018-04-29 DIAGNOSIS — Z23 Encounter for immunization: Secondary | ICD-10-CM | POA: Diagnosis not present

## 2018-04-29 DIAGNOSIS — Z111 Encounter for screening for respiratory tuberculosis: Secondary | ICD-10-CM

## 2018-04-29 DIAGNOSIS — M79641 Pain in right hand: Secondary | ICD-10-CM

## 2018-04-29 NOTE — Progress Notes (Signed)
Subjective:  Patient ID: David Buck, male    DOB: 1940/12/22  Age: 77 y.o. MRN: [id number]  CC: No chief complaint on file.   HPI David Buck presents for hand cramps - painting a house Ready to move to [place]  Outpatient Medications Prior to Visit  Medication Sig Dispense Refill  . Aclidinium Bromide (TUDORZA PRESSAIR) 400 MCG/ACT AEPB Inhale 1 Act into the lungs 2 (two) times daily. 1 each 11  . amLODipine (NORVASC) 2.5 MG tablet Take 1 tablet (2.5 mg total) by mouth daily. 30 tablet 11  . aspirin EC 81 MG tablet Take 81 mg by mouth daily.    . Cholecalciferol 2000 units TABS Take 1 tablet by mouth daily.    Marland Kitchen gabapentin (NEURONTIN) 100 MG capsule Take 1-2 capsules (100-200 mg total) by mouth 3 (three) times daily as needed. 120 capsule 3  . Glucosamine HCl 1000 MG TABS Take 1 tablet by mouth daily.    Marland Kitchen levothyroxine (SYNTHROID, LEVOTHROID) 88 MCG tablet Take 1 tablet (88 mcg total) by mouth daily. 90 tablet 3  . loratadine (CLARITIN) 10 MG tablet Take 10 mg by mouth daily.     . Multiple Vitamin (MULTIVITAMIN) tablet Take 1 tablet by mouth daily.    . saw palmetto 160 MG capsule Take 160 mg by mouth daily.    . vitamin C (ASCORBIC ACID) 500 MG tablet Take 500 mg by mouth daily.     No facility-administered medications prior to visit.     ROS: Review of Systems  Constitutional: Negative for appetite change, fatigue and unexpected weight change.  HENT: Negative for congestion, nosebleeds, sneezing, sore throat and trouble swallowing.   Eyes: Negative for itching and visual disturbance.  Respiratory: Negative for cough.   Cardiovascular: Negative for chest pain, palpitations and leg swelling.  Gastrointestinal: Negative for abdominal distention, blood in stool, diarrhea and nausea.  Genitourinary: Negative for frequency and hematuria.  Musculoskeletal: Positive for myalgias. Negative for back pain, gait problem, joint swelling and neck pain.  Skin: Negative  for rash.  Neurological: Negative for dizziness, tremors, speech difficulty and weakness.  Psychiatric/Behavioral: Negative for agitation, dysphoric mood, sleep disturbance and suicidal ideas. The patient is not nervous/anxious.     Objective:  BP 140/78 (BP Location: Left Arm, Patient Position: Sitting, Cuff Size: Normal)   Pulse 73   Temp 98 F (36.7 C) (Oral)   Ht 6\' 2"  (1.88 m)   Wt 162 lb (73.5 kg)   SpO2 97%   BMI 20.80 kg/m   BP Readings from Last 3 Encounters:  04/29/18 140/78  02/25/18 128/82  02/03/18 116/62    Wt Readings from Last 3 Encounters:  04/29/18 162 lb (73.5 kg)  02/25/18 166 lb (75.3 kg)  02/03/18 167 lb (75.8 kg)    Physical Exam  Constitutional: He is oriented to person, place, and time. He appears well-developed. No distress.  NAD  HENT:  Mouth/Throat: Oropharynx is clear and moist.  Eyes: Pupils are equal, round, and reactive to light. Conjunctivae are normal.  Neck: Normal range of motion. No JVD present. No thyromegaly present.  Cardiovascular: Normal rate, regular rhythm, normal heart sounds and intact distal pulses. Exam reveals no gallop and no friction rub.  No murmur heard. Pulmonary/Chest: Effort normal and breath sounds normal. No respiratory distress. He has no wheezes. He has no rales. He exhibits no tenderness.  Abdominal: Soft. Bowel sounds are normal. He exhibits no distension and no mass. There is no tenderness. There  is no rebound and no guarding.  Musculoskeletal: Normal range of motion. He exhibits no edema or tenderness.  Lymphadenopathy:    He has no cervical adenopathy.  Neurological: He is alert and oriented to person, place, and time. He has normal reflexes. No cranial nerve deficit. He exhibits normal muscle tone. He displays a negative Romberg sign. Coordination and gait normal.  Skin: Skin is warm and dry. No rash noted.  Psychiatric: He has a normal mood and affect. His behavior is normal. Judgment and thought content  normal.    Lab Results  Component Value Date   WBC 6.4 01/23/2018   HGB 13.5 01/23/2018   HCT 39.9 01/23/2018   PLT 249.0 01/23/2018   GLUCOSE 104 (H) 01/23/2018   CHOL 128 10/31/2017   TRIG 45.0 10/31/2017   HDL 56.90 10/31/2017   LDLCALC 63 10/31/2017   ALT 18 01/23/2018   AST 17 01/23/2018   NA 137 01/23/2018   K 4.0 01/23/2018   CL 105 01/23/2018   CREATININE 0.98 01/23/2018   BUN 26 (H) 01/23/2018   CO2 23 01/23/2018   TSH 3.54 01/23/2018   PSA 2.84 10/31/2017   INR 1.04 01/06/2016   HGBA1C 5.8 10/26/2010    Ct Chest W Contrast  Result Date: 01/31/2018 CLINICAL DATA:  77 year old male with a history of 10 pound weight loss EXAM: CT CHEST WITH CONTRAST TECHNIQUE: Multidetector CT imaging of the chest was performed during intravenous contrast administration. CONTRAST:  63mL ISOVUE-300 IOPAMIDOL (ISOVUE-300) INJECTION 61% COMPARISON:  CT 09/21/2015 FINDINGS: Cardiovascular: Heart size within normal limits. No pericardial fluid/thickening. Calcifications of the left anterior descending, circumflex coronary arteries. Thoracic aorta of normal course caliber and contour. No aneurysm or dissection. Mild atherosclerotic changes. Branch vessels are patent. Pulmonary arteries unremarkable with no filling defects to the proximal subsegmental vessels. Mediastinum/Nodes: No mediastinal adenopathy. Unremarkable course of the thoracic esophagus. Unremarkable thoracic inlet. Lungs/Pleura: Centrilobular and paraseptal emphysema. Tree-in-bud and peribronchial opacities with regions of endobronchial debris/plugging involving right upper lobe, middle lobe, left upper lobe including lingula, right lower lobe, left lower lobe. No pleural effusion. No confluent airspace disease. Overall, some of these regions are unchanged from the comparison CT, while some are new. Upper Abdomen: No acute finding.  Left renal cysts. Musculoskeletal: No acute displaced fracture.  Degenerative changes. IMPRESSION: Mild  pattern of tree-in-bud/centrilobular nodularity and associated endobronchial debris/plugging of all lobes, compatible with infection. Some regions have improved and some are persistent from the comparison CT of 09/21/2015. Given the patient's apparent risk factors, repeat CT can be considered in 12 months. This recommendation follows the consensus statement: Guidelines for Management of Incidental Pulmonary Nodules Detected on CT Images: From the Fleischner Society 2017; Radiology 2017; 284:228-243. Coronary artery disease and associated atherosclerosis of the aorta. Aortic Atherosclerosis (ICD10-I70.0). Electronically Signed   By: Corrie Mckusick D.O.   On: 01/31/2018 17:22    Assessment & Plan:   Diagnoses and all orders for this visit:  Need for influenza vaccination -     Flu vaccine HIGH DOSE PF (Fluzone High dose)  Visit for TB skin test -     TB Skin Test     No orders of the defined types were placed in this encounter.    Follow-up: No follow-ups on file.  Walker Kehr, MD

## 2018-04-29 NOTE — Patient Instructions (Signed)
Compression socks

## 2018-05-01 LAB — TB SKIN TEST
INDURATION: 0 mm
TB SKIN TEST: NEGATIVE

## 2018-05-30 ENCOUNTER — Telehealth: Payer: Self-pay | Admitting: Internal Medicine

## 2018-05-30 NOTE — Telephone Encounter (Signed)
Spoke to patient regarding scheduling an AWV (him and wife). He states that they would not like to schedule at this time due to currently moving and having a lot going on.

## 2018-07-02 DIAGNOSIS — H9319 Tinnitus, unspecified ear: Secondary | ICD-10-CM | POA: Diagnosis not present

## 2018-07-02 DIAGNOSIS — H93233 Hyperacusis, bilateral: Secondary | ICD-10-CM | POA: Diagnosis not present

## 2018-07-02 DIAGNOSIS — H903 Sensorineural hearing loss, bilateral: Secondary | ICD-10-CM | POA: Diagnosis not present

## 2018-07-29 ENCOUNTER — Telehealth: Payer: Self-pay | Admitting: Internal Medicine

## 2018-07-29 NOTE — Telephone Encounter (Signed)
Called patient to schedule AWV. Patient did not answer; could not leave voicemail. SF

## 2018-08-04 ENCOUNTER — Ambulatory Visit: Payer: Medicare Other | Admitting: Internal Medicine

## 2018-08-06 ENCOUNTER — Ambulatory Visit (INDEPENDENT_AMBULATORY_CARE_PROVIDER_SITE_OTHER)
Admission: RE | Admit: 2018-08-06 | Discharge: 2018-08-06 | Disposition: A | Discharge status: Home / Self Care | Payer: Medicare Other | Source: Ambulatory Visit | Attending: Internal Medicine | Admitting: Internal Medicine

## 2018-08-06 DIAGNOSIS — R911 Solitary pulmonary nodule: Secondary | ICD-10-CM

## 2018-08-06 DIAGNOSIS — J432 Centrilobular emphysema: Secondary | ICD-10-CM | POA: Diagnosis not present

## 2018-08-06 DIAGNOSIS — J479 Bronchiectasis, uncomplicated: Secondary | ICD-10-CM | POA: Diagnosis not present

## 2018-08-07 ENCOUNTER — Ambulatory Visit (INDEPENDENT_AMBULATORY_CARE_PROVIDER_SITE_OTHER): Payer: Medicare Other | Admitting: Internal Medicine

## 2018-08-07 ENCOUNTER — Encounter: Payer: Self-pay | Admitting: Internal Medicine

## 2018-08-07 VITALS — BP 120/70 | HR 73 | Ht 74.0 in | Wt 159.0 lb

## 2018-08-07 DIAGNOSIS — F17200 Nicotine dependence, unspecified, uncomplicated: Secondary | ICD-10-CM | POA: Diagnosis not present

## 2018-08-07 DIAGNOSIS — R9389 Abnormal findings on diagnostic imaging of other specified body structures: Secondary | ICD-10-CM

## 2018-08-07 DIAGNOSIS — J449 Chronic obstructive pulmonary disease, unspecified: Secondary | ICD-10-CM

## 2018-08-07 DIAGNOSIS — I700 Atherosclerosis of aorta: Secondary | ICD-10-CM

## 2018-08-07 MED ORDER — FLUTICASONE-UMECLIDIN-VILANT 100-62.5-25 MCG/INH IN AEPB: 1.0000 | INHALATION_SPRAY | Freq: Every day | RESPIRATORY_TRACT | 12 refills | Status: DC

## 2018-08-07 MED ORDER — FLUTICASONE-UMECLIDIN-VILANT 100-62.5-25 MCG/INH IN AEPB: 1.0000 | INHALATION_SPRAY | Freq: Every day | RESPIRATORY_TRACT | 0 refills | Status: DC

## 2018-08-07 NOTE — Patient Instructions (Signed)
Sample and print script for Trelegy maintenance inhaler    Inhale 1 puff, then rinse mouth, once daily  Keep working on the smoking !!  Please call if we can help

## 2018-08-07 NOTE — Progress Notes (Signed)
Subjective:    Patient ID: David Buck, male    DOB: 1940-10-20, 78 y.o.   MRN: [id number]    M 1 PPD smoker followed for small lung nodules/ RUL nodular scarring/ benign bronchoscopy 04/14/08- neg AFB. 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 ------------------------------------------------------------------------------------   02/03/2018- 78 year old male one pack per day smoker followed for small lung nodules/right upper lobe nodular scarring/benign bronchoscopy 16/06/930-[id number] AFB, complicated by vasomotor rhinitis, -----Patient was told to come in by Dr. Alain Marion due to most recent CT scan. He had the Ct due to recent fatigued and weight loss.   Wife is here. [place],  Weight is down about 6 pounds over the last year- always lean. He had a flulike illness in February/March treated by PCP with doxycycline which helped, but relapsed.  CXR unremarkable.  Tick bite this spring.  In June had syncopal episode with negative work-up after changing BP med-told he was dehydrated.  CT chest during that evaluation noted changes described below.  I reviewed the report and images with him and his wife. He denies change in chronic mild cough as he continues to smoke.  Clear postnasal drip.  No sweats or fevers.  Denies chest pain or palpitation, adenopathy or blood. Admits to being a bit stressed as he anticipates moved to [place], partly because it would restrict his smoking. CT chest 01/31/2018- IMPRESSION: Mild pattern of tree-in-bud/centrilobular nodularity and associated endobronchial debris/plugging of all lobes, compatible with infection. Some regions have improved and some are persistent from the comparison CT of 09/21/2015.  08/07/2018- 78 year old male one pack per day smoker followed for small lung nodules/right  upper lobe nodular scarring/benign bronchoscopy 25/42/7062-[id number] AFB, complicated by vasomotor rhinitis, -----6 month visit. Patient stated no issues since last office visit. He and his wife have moved to [place].  He goes off campus to smoke still despite advice and this was discussed.  Has not been using [place].  Uses an occasional loratadine.  Cough is stable without discolored sputum, fever, adenopathy or sweats. We reviewed most recent CT. I suggested we defer updating CT and PFT until next return since he is clinically stable. CT chest 08/06/2018- IMPRESSION: 1. Scattered peribronchovascular nodularity, bronchiectasis and mild architectural distortion, similar to 01/31/2018. Findings may be post infectious in etiology or due to mycobacterium avium complex. 2. Aortic atherosclerosis (ICD10-170.0). Coronary artery calcification. 3.  Emphysema (ICD10-J43.9).  //consider update CT chest and PFT on return//  ROS-see HPI  + = positive Constitutional:   No-   weight loss, night sweats, fevers, chills, fatigue, lassitude. HEENT:   No-  headaches, difficulty swallowing, tooth/dental problems, sore throat,       No-  sneezing, itching, ear ache, nasal congestion, +post nasal drip,  CV:  No-   chest pain, orthopnea, PND, swelling in lower extremities, anasarca, dizziness, palpitations Resp: No-   shortness of breath with exertion or at rest.              No-   productive cough,  No- non-productive cough,  No- coughing up of blood.              No-   change in color of mucus.  No- wheezing.   Skin: No-   rash or lesions. GI:  No-   heartburn, indigestion, abdominal pain, nausea, vomiting,  GU:  MS:  No-   joint pain or swelling.  Neuro-     nothing unusual Psych:  No- change in mood or affect. No depression or anxiety.  No memory loss.  Objective:   Physical Exam General- Alert, Oriented, Affect-appropriate, Distress- none acute. tall slender man Skin- rash-none, lesions- none,  excoriation- none Lymphadenopathy- none Head- atraumatic            Eyes- Gross vision intact, PERRLA, conjunctivae clear secretions            Ears- Hearing, canals normal            Nose- Clear, No- Septal dev, +mucus postnasal drip, polyps, erosion, perforation             Throat- Mallampati II , mucosa clear , drainage- none, tonsils- atrophic  dentures Neck- flexible , trachea midline, no stridor , thyroid nl, carotid no bruit Chest - symmetrical excursion , unlabored           Heart/CV- RRR , no murmur , no gallop  , no rub, nl s1 s2                           - JVD- none , edema- none, stasis changes- none, varices- none           Lung- Clear, unlabored, wheeze- none, cough- none , dullness-none, rub- none           Chest wall-  Abd-  Br/ Gen/ Rectal- Not done, not indicated Extrem- cyanosis- none, clubbing, none, atrophy- none, strength- nl Neuro-+ mild head bobbing tremor    Assessment & Plan:

## 2018-08-10 DIAGNOSIS — I700 Atherosclerosis of aorta: Secondary | ICD-10-CM | POA: Insufficient documentation

## 2018-08-10 NOTE — Assessment & Plan Note (Signed)
He will discuss this with his PCP

## 2018-08-10 NOTE — Assessment & Plan Note (Signed)
Clinicallystable despite ongoing smoking Plan - to schedule full PFT following next ov

## 2018-08-10 NOTE — Assessment & Plan Note (Signed)
His wife and I both pressed him to try harder and accept support for a full smoking cessation program.

## 2018-08-10 NOTE — Assessment & Plan Note (Signed)
Most consistent with an inflammatory process.  We have not cultured atypical AFB but bronchiolitis, micro-nodularity and mild bronchiectasis would be consistent.  We will continue to follow. Plan-at next visit we will schedule a future chest CT

## 2018-08-20 DIAGNOSIS — D1801 Hemangioma of skin and subcutaneous tissue: Secondary | ICD-10-CM | POA: Diagnosis not present

## 2018-08-20 DIAGNOSIS — D692 Other nonthrombocytopenic purpura: Secondary | ICD-10-CM | POA: Diagnosis not present

## 2018-08-20 DIAGNOSIS — L814 Other melanin hyperpigmentation: Secondary | ICD-10-CM | POA: Diagnosis not present

## 2018-08-20 DIAGNOSIS — D225 Melanocytic nevi of trunk: Secondary | ICD-10-CM | POA: Diagnosis not present

## 2018-08-20 DIAGNOSIS — L821 Other seborrheic keratosis: Secondary | ICD-10-CM | POA: Diagnosis not present

## 2018-08-20 DIAGNOSIS — L738 Other specified follicular disorders: Secondary | ICD-10-CM | POA: Diagnosis not present

## 2018-08-26 ENCOUNTER — Encounter: Payer: Self-pay | Admitting: Internal Medicine

## 2018-08-26 ENCOUNTER — Ambulatory Visit (INDEPENDENT_AMBULATORY_CARE_PROVIDER_SITE_OTHER): Payer: Medicare Other | Admitting: Internal Medicine

## 2018-08-26 VITALS — BP 132/84 | HR 62 | Temp 97.5°F | Ht 74.0 in | Wt 166.0 lb

## 2018-08-26 DIAGNOSIS — R972 Elevated prostate specific antigen [PSA]: Secondary | ICD-10-CM

## 2018-08-26 DIAGNOSIS — I2510 Atherosclerotic heart disease of native coronary artery without angina pectoris: Secondary | ICD-10-CM | POA: Insufficient documentation

## 2018-08-26 DIAGNOSIS — I2583 Coronary atherosclerosis due to lipid rich plaque: Secondary | ICD-10-CM | POA: Diagnosis not present

## 2018-08-26 DIAGNOSIS — I10 Essential (primary) hypertension: Secondary | ICD-10-CM

## 2018-08-26 DIAGNOSIS — E039 Hypothyroidism, unspecified: Secondary | ICD-10-CM | POA: Diagnosis not present

## 2018-08-26 MED ORDER — HYDROCHLOROTHIAZIDE 12.5 MG PO CAPS: 12.5000 mg | ORAL_CAPSULE | Freq: Every day | ORAL | 3 refills | Status: DC

## 2018-08-26 MED ORDER — AMLODIPINE BESYLATE 2.5 MG PO TABS: 1.2500 mg | ORAL_TABLET | Freq: Every day | ORAL | 1 refills | Status: DC

## 2018-08-26 MED ORDER — LOVASTATIN 20 MG PO TABS: 20.0000 mg | ORAL_TABLET | Freq: Every day | ORAL | 3 refills | Status: DC

## 2018-08-26 NOTE — Progress Notes (Signed)
Subjective:  Patient ID: David Buck, male    DOB: 09-Apr-1941  Age: 78 y.o. MRN: [id number]  CC: No chief complaint on file.   HPI David Buck presents for leg swelling on Norvasc 1.25 mg/d.  Follow-up on hypertension.  No syncope or relapse  Outpatient Medications Prior to Visit  Medication Sig Dispense Refill  . amLODipine (NORVASC) 2.5 MG tablet Take 1 tablet (2.5 mg total) by mouth daily. 30 tablet 11  . aspirin EC 81 MG tablet Take 81 mg by mouth daily.    . Cholecalciferol 2000 units TABS Take 1 tablet by mouth daily.    . Fluticasone-Umeclidin-Vilant (TRELEGY ELLIPTA) 100-62.5-25 MCG/INH AEPB Inhale 1 puff into the lungs daily. Rinse mouth 60 each 12  . gabapentin (NEURONTIN) 100 MG capsule Take 1-2 capsules (100-200 mg total) by mouth 3 (three) times daily as needed. 120 capsule 3  . Glucosamine HCl 1000 MG TABS Take 1 tablet by mouth daily.    Marland Kitchen levothyroxine (SYNTHROID, LEVOTHROID) 88 MCG tablet Take 1 tablet (88 mcg total) by mouth daily. 90 tablet 3  . loratadine (CLARITIN) 10 MG tablet Take 10 mg by mouth daily.     . Multiple Vitamin (MULTIVITAMIN) tablet Take 1 tablet by mouth daily.    . vitamin C (ASCORBIC ACID) 500 MG tablet Take 500 mg by mouth daily.    . Aclidinium Bromide (TUDORZA PRESSAIR) 400 MCG/ACT AEPB Inhale 1 Act into the lungs 2 (two) times daily. (Patient not taking: Reported on 08/26/2018) 1 each 11  . Fluticasone-Umeclidin-Vilant (TRELEGY ELLIPTA) 100-62.5-25 MCG/INH AEPB Inhale 1 puff into the lungs daily for 28 days. 1 each 0   No facility-administered medications prior to visit.     ROS: Review of Systems  Constitutional: Negative for appetite change, fatigue and unexpected weight change.  HENT: Negative for congestion, nosebleeds, sneezing, sore throat and trouble swallowing.   Eyes: Negative for itching and visual disturbance.  Respiratory: Negative for cough.   Cardiovascular: Positive for leg swelling. Negative for chest pain and  palpitations.  Gastrointestinal: Negative for abdominal distention, blood in stool, diarrhea and nausea.  Genitourinary: Negative for frequency and hematuria.  Musculoskeletal: Negative for back pain, gait problem, joint swelling and neck pain.  Skin: Negative for rash.  Neurological: Negative for dizziness, tremors, speech difficulty and weakness.  Psychiatric/Behavioral: Negative for agitation, dysphoric mood, sleep disturbance and suicidal ideas. The patient is not nervous/anxious.     Objective:  BP 132/84 (BP Location: Left Arm, Patient Position: Sitting, Cuff Size: Normal)   Pulse 62   Temp (!) 97.5 F (36.4 C) (Oral)   Ht _0  (1.88 m)   Wt 166 lb (75.3 kg)   SpO2 95%   BMI 21.31 kg/m   BP Readings from Last 3 Encounters:  08/26/18 132/84  08/07/18 120/70  04/29/18 140/78    Wt Readings from Last 3 Encounters:  08/26/18 166 lb (75.3 kg)  08/07/18 159 lb (72.1 kg)  04/29/18 162 lb (73.5 kg)    Physical Exam Constitutional:      General: He is not in acute distress.    Appearance: He is well-developed.     Comments: NAD  Eyes:     Conjunctiva/sclera: Conjunctivae normal.     Pupils: Pupils are equal, round, and reactive to light.  Neck:     Musculoskeletal: Normal range of motion.     Thyroid: No thyromegaly.     Vascular: No JVD.  Cardiovascular:     Rate and Rhythm:  Normal rate and regular rhythm.     Heart sounds: Normal heart sounds. No murmur. No friction rub. No gallop.   Pulmonary:     Effort: Pulmonary effort is normal. No respiratory distress.     Breath sounds: Normal breath sounds. No wheezing or rales.  Chest:     Chest wall: No tenderness.  Abdominal:     General: Bowel sounds are normal. There is no distension.     Palpations: Abdomen is soft. There is no mass.     Tenderness: There is no abdominal tenderness. There is no guarding or rebound.  Musculoskeletal: Normal range of motion.        General: No tenderness.     Left lower leg: Edema  present.  Lymphadenopathy:     Cervical: No cervical adenopathy.  Skin:    General: Skin is warm and dry.     Findings: No rash.  Neurological:     Mental Status: He is alert and oriented to person, place, and time.     Cranial Nerves: No cranial nerve deficit.     Motor: No abnormal muscle tone.     Coordination: Coordination normal.     Gait: Gait normal.     Deep Tendon Reflexes: Reflexes are normal and symmetric.  Psychiatric:        Behavior: Behavior normal.        Thought Content: Thought content normal.        Judgment: Judgment normal.   LE edema  Lab Results  Component Value Date   WBC 6.4 01/23/2018   HGB 13.5 01/23/2018   HCT 39.9 01/23/2018   PLT 249.0 01/23/2018   GLUCOSE 104 (H) 01/23/2018   CHOL 128 10/31/2017   TRIG 45.0 10/31/2017   HDL 56.90 10/31/2017   LDLCALC 63 10/31/2017   ALT 18 01/23/2018   AST 17 01/23/2018   NA 137 01/23/2018   K 4.0 01/23/2018   CL 105 01/23/2018   CREATININE 0.98 01/23/2018   BUN 26 (H) 01/23/2018   CO2 23 01/23/2018   TSH 3.54 01/23/2018   PSA 2.84 10/31/2017   INR 1.04 01/06/2016   HGBA1C 5.8 10/26/2010    Ct Chest Wo Contrast  Result Date: 08/06/2018 CLINICAL DATA:  Lung nodules.  Smoker. EXAM: CT CHEST WITHOUT CONTRAST TECHNIQUE: Multidetector CT imaging of the chest was performed following the standard protocol without IV contrast. COMPARISON:  01/31/2018 and 09/21/2015. FINDINGS: Cardiovascular: Atherosclerotic calcification of the aorta and coronary arteries. Heart size normal. Small amount of pericardial fluid is unchanged and likely physiologic. Mediastinum/Nodes: No pathologically enlarged mediastinal or axillary lymph nodes. Hilar regions are difficult to evaluate without IV contrast. Esophagus is grossly unremarkable. Lungs/Pleura: Biapical pleuroparenchymal scarring. Centrilobular emphysema. Scattered mild peribronchovascular nodularity, bronchiectasis and architectural distortion bilaterally, worst in the  posterior segment right upper lobe, as on the prior exam. No worrisome pulmonary nodules. No pleural fluid. Airway is otherwise unremarkable. Upper Abdomen: Visualized portions of the liver and adrenal glands are unremarkable. Low-attenuation lesions in the left kidney measure up to 6.6 cm, similar but difficult to further characterize without post-contrast imaging or due to incomplete visualization. Visualized portions of the spleen, pancreas and stomach are grossly unremarkable. Musculoskeletal: No worrisome lytic or sclerotic lesions. IMPRESSION: 1. Scattered peribronchovascular nodularity, bronchiectasis and mild architectural distortion, similar to 01/31/2018. Findings may be post infectious in etiology or due to mycobacterium avium complex. 2. Aortic atherosclerosis (ICD10-170.0). Coronary artery calcification. 3.  Emphysema (ICD10-J43.9). Electronically Signed   By: Lorin Picket  M.D.   On: 08/06/2018 14:51    Assessment & Plan:   There are no diagnoses linked to this encounter.   No orders of the defined types were placed in this encounter.    Follow-up: No follow-ups on file.  Walker Kehr, MD

## 2018-08-26 NOTE — Assessment & Plan Note (Signed)
Agreed to take Lovastatin ASA

## 2018-10-22 ENCOUNTER — Other Ambulatory Visit: Payer: Self-pay | Admitting: Internal Medicine

## 2018-12-31 ENCOUNTER — Other Ambulatory Visit (INDEPENDENT_AMBULATORY_CARE_PROVIDER_SITE_OTHER): Payer: Medicare Other

## 2018-12-31 DIAGNOSIS — R972 Elevated prostate specific antigen [PSA]: Secondary | ICD-10-CM | POA: Diagnosis not present

## 2018-12-31 DIAGNOSIS — E039 Hypothyroidism, unspecified: Secondary | ICD-10-CM | POA: Diagnosis not present

## 2018-12-31 LAB — BASIC METABOLIC PANEL
BUN: 25 mg/dL — ABNORMAL HIGH (ref 6–23)
CO2: 24 mEq/L (ref 19–32)
Calcium: 9.4 mg/dL (ref 8.4–10.5)
Chloride: 106 mEq/L (ref 96–112)
Creatinine, Ser: 0.87 mg/dL (ref 0.40–1.50)
GFR: 85.11 mL/min (ref >60.00)
Glucose, Bld: 89 mg/dL (ref 70–99)
Potassium: 4.1 mEq/L (ref 3.5–5.1)
Sodium: 139 mEq/L (ref 135–145)

## 2018-12-31 LAB — HEPATIC FUNCTION PANEL
ALT: 17 U/L (ref 0–53)
AST: 18 U/L (ref 0–37)
Albumin: 4.4 g/dL (ref 3.5–5.2)
Alkaline Phosphatase: 69 U/L (ref 39–117)
Bilirubin, Direct: 0.1 mg/dL (ref 0.0–0.3)
Total Bilirubin: 0.6 mg/dL (ref 0.2–1.2)
Total Protein: 6.7 g/dL (ref 6.0–8.3)

## 2018-12-31 LAB — LIPID PANEL
Cholesterol: 114 mg/dL (ref 0–200)
HDL: 53.3 mg/dL (ref >39.00)
LDL Cholesterol: 49 mg/dL (ref 0–99)
NonHDL: 60.31
Total CHOL/HDL Ratio: 2
Triglycerides: 57 mg/dL (ref 0.0–149.0)
VLDL: 11.4 mg/dL (ref 0.0–40.0)

## 2018-12-31 LAB — PSA: PSA: 3.23 ng/mL (ref 0.10–4.00)

## 2018-12-31 LAB — TSH: TSH: 0.2 u[IU]/mL — ABNORMAL LOW (ref 0.35–4.50)

## 2018-12-31 LAB — T4, FREE: Free T4: 1.02 ng/dL (ref 0.60–1.60)

## 2019-01-21 DIAGNOSIS — H52223 Regular astigmatism, bilateral: Secondary | ICD-10-CM | POA: Diagnosis not present

## 2019-01-21 DIAGNOSIS — Z961 Presence of intraocular lens: Secondary | ICD-10-CM | POA: Diagnosis not present

## 2019-01-21 DIAGNOSIS — H5212 Myopia, left eye: Secondary | ICD-10-CM | POA: Diagnosis not present

## 2019-01-21 DIAGNOSIS — H5201 Hypermetropia, right eye: Secondary | ICD-10-CM | POA: Diagnosis not present

## 2019-01-21 DIAGNOSIS — H33303 Unspecified retinal break, bilateral: Secondary | ICD-10-CM | POA: Diagnosis not present

## 2019-01-21 DIAGNOSIS — H524 Presbyopia: Secondary | ICD-10-CM | POA: Diagnosis not present

## 2019-01-22 DIAGNOSIS — Z471 Aftercare following joint replacement surgery: Secondary | ICD-10-CM | POA: Diagnosis not present

## 2019-01-22 DIAGNOSIS — Z96651 Presence of right artificial knee joint: Secondary | ICD-10-CM | POA: Diagnosis not present

## 2019-01-23 DIAGNOSIS — L821 Other seborrheic keratosis: Secondary | ICD-10-CM | POA: Diagnosis not present

## 2019-01-23 DIAGNOSIS — L720 Epidermal cyst: Secondary | ICD-10-CM | POA: Diagnosis not present

## 2019-01-23 DIAGNOSIS — L738 Other specified follicular disorders: Secondary | ICD-10-CM | POA: Diagnosis not present

## 2019-01-23 DIAGNOSIS — D1801 Hemangioma of skin and subcutaneous tissue: Secondary | ICD-10-CM | POA: Diagnosis not present

## 2019-01-23 DIAGNOSIS — L820 Inflamed seborrheic keratosis: Secondary | ICD-10-CM | POA: Diagnosis not present

## 2019-02-26 ENCOUNTER — Ambulatory Visit (INDEPENDENT_AMBULATORY_CARE_PROVIDER_SITE_OTHER): Payer: Medicare Other | Admitting: Internal Medicine

## 2019-02-26 ENCOUNTER — Other Ambulatory Visit: Payer: Self-pay

## 2019-02-26 ENCOUNTER — Encounter: Payer: Self-pay | Admitting: Internal Medicine

## 2019-02-26 DIAGNOSIS — E034 Atrophy of thyroid (acquired): Secondary | ICD-10-CM

## 2019-02-26 DIAGNOSIS — I10 Essential (primary) hypertension: Secondary | ICD-10-CM | POA: Diagnosis not present

## 2019-02-26 NOTE — Patient Instructions (Signed)
If you have medicare related insurance (such as traditional Medicare, Blue Cross Medicare, [employer] Medicare, or similar), Please make an appointment at the scheduling desk with Jill, the Wellness Health Coach, for your Wellness visit in this office, which is a benefit with your insurance.  

## 2019-02-26 NOTE — Progress Notes (Signed)
Subjective:  Patient ID: David Buck, male    DOB: 12-Sep-1940  Age: 78 y.o. MRN: [id number]  CC: No chief complaint on file.   HPI David Buck presents for COPD, HTN. Dizziness f/u  Outpatient Medications Prior to Visit  Medication Sig Dispense Refill  . amLODipine (NORVASC) 2.5 MG tablet Take 0.5 tablets (1.25 mg total) by mouth daily. 90 tablet 1  . aspirin EC 81 MG tablet Take 81 mg by mouth daily.    . Cholecalciferol 2000 units TABS Take 1 tablet by mouth daily.    . Fluticasone-Umeclidin-Vilant (TRELEGY ELLIPTA) 100-62.5-25 MCG/INH AEPB Inhale 1 puff into the lungs daily. Rinse mouth 60 each 12  . gabapentin (NEURONTIN) 100 MG capsule Take 1-2 capsules (100-200 mg total) by mouth 3 (three) times daily as needed. 120 capsule 3  . Glucosamine HCl 1000 MG TABS Take 1 tablet by mouth daily.    . hydrochlorothiazide (MICROZIDE) 12.5 MG capsule Take 1 capsule (12.5 mg total) by mouth daily. 90 capsule 3  . levothyroxine (SYNTHROID) 88 MCG tablet TAKE ONE TABLET BY MOUTH DAILY 90 tablet 3  . loratadine (CLARITIN) 10 MG tablet Take 10 mg by mouth daily.     . Multiple Vitamin (MULTIVITAMIN) tablet Take 1 tablet by mouth daily.    . vitamin C (ASCORBIC ACID) 500 MG tablet Take 500 mg by mouth daily.    Marland Kitchen lovastatin (MEVACOR) 20 MG tablet Take 1 tablet (20 mg total) by mouth at bedtime. (Patient not taking: Reported on 02/26/2019) 90 tablet 3   No facility-administered medications prior to visit.     ROS: Review of Systems  Constitutional: Negative for appetite change, fatigue and unexpected weight change.  HENT: Negative for congestion, nosebleeds, sneezing, sore throat and trouble swallowing.   Eyes: Negative for itching and visual disturbance.  Respiratory: Negative for cough.   Cardiovascular: Negative for chest pain, palpitations and leg swelling.  Gastrointestinal: Negative for abdominal distention, blood in stool, diarrhea and nausea.  Genitourinary: Negative for  frequency and hematuria.  Musculoskeletal: Negative for back pain, gait problem, joint swelling and neck pain.  Skin: Negative for rash.  Neurological: Negative for dizziness, tremors, speech difficulty and weakness.  Psychiatric/Behavioral: Negative for agitation, dysphoric mood, sleep disturbance and suicidal ideas. The patient is not nervous/anxious.     Objective:  BP 140/86 (BP Location: Left Arm, Patient Position: Sitting, Cuff Size: Normal)   Pulse 79   Temp 98.2 F (36.8 C) (Oral)   Ht _0  (1.88 m)   Wt 167 lb (75.8 kg)   SpO2 94%   BMI 21.44 kg/m   BP Readings from Last 3 Encounters:  02/26/19 140/86  08/26/18 132/84  08/07/18 120/70    Wt Readings from Last 3 Encounters:  02/26/19 167 lb (75.8 kg)  08/26/18 166 lb (75.3 kg)  08/07/18 159 lb (72.1 kg)    Physical Exam Constitutional:      General: He is not in acute distress.    Appearance: He is well-developed.     Comments: NAD  Eyes:     Conjunctiva/sclera: Conjunctivae normal.     Pupils: Pupils are equal, round, and reactive to light.  Neck:     Musculoskeletal: Normal range of motion.     Thyroid: No thyromegaly.     Vascular: No JVD.  Cardiovascular:     Rate and Rhythm: Normal rate and regular rhythm.     Heart sounds: Normal heart sounds. No murmur. No friction rub. No gallop.  Pulmonary:     Effort: Pulmonary effort is normal. No respiratory distress.     Breath sounds: Normal breath sounds. No wheezing or rales.  Chest:     Chest wall: No tenderness.  Abdominal:     General: Bowel sounds are normal. There is no distension.     Palpations: Abdomen is soft. There is no mass.     Tenderness: There is no abdominal tenderness. There is no guarding or rebound.  Musculoskeletal: Normal range of motion.        General: No tenderness.  Lymphadenopathy:     Cervical: No cervical adenopathy.  Skin:    General: Skin is warm and dry.     Findings: No rash.  Neurological:     Mental Status: He is  alert and oriented to person, place, and time.     Cranial Nerves: No cranial nerve deficit.     Motor: No abnormal muscle tone.     Coordination: Coordination normal.     Gait: Gait normal.     Deep Tendon Reflexes: Reflexes are normal and symmetric.  Psychiatric:        Behavior: Behavior normal.        Thought Content: Thought content normal.        Judgment: Judgment normal.     Lab Results  Component Value Date   WBC 6.4 01/23/2018   HGB 13.5 01/23/2018   HCT 39.9 01/23/2018   PLT 249.0 01/23/2018   GLUCOSE 89 12/31/2018   CHOL 114 12/31/2018   TRIG 57.0 12/31/2018   HDL 53.30 12/31/2018   LDLCALC 49 12/31/2018   ALT 17 12/31/2018   AST 18 12/31/2018   NA 139 12/31/2018   K 4.1 12/31/2018   CL 106 12/31/2018   CREATININE 0.87 12/31/2018   BUN 25 (H) 12/31/2018   CO2 24 12/31/2018   TSH 0.20 (L) 12/31/2018   PSA 3.23 12/31/2018   INR 1.04 01/06/2016   HGBA1C 5.8 10/26/2010    Ct Chest Wo Contrast  Result Date: 08/06/2018 CLINICAL DATA:  Lung nodules.  Smoker. EXAM: CT CHEST WITHOUT CONTRAST TECHNIQUE: Multidetector CT imaging of the chest was performed following the standard protocol without IV contrast. COMPARISON:  01/31/2018 and 09/21/2015. FINDINGS: Cardiovascular: Atherosclerotic calcification of the aorta and coronary arteries. Heart size normal. Small amount of pericardial fluid is unchanged and likely physiologic. Mediastinum/Nodes: No pathologically enlarged mediastinal or axillary lymph nodes. Hilar regions are difficult to evaluate without IV contrast. Esophagus is grossly unremarkable. Lungs/Pleura: Biapical pleuroparenchymal scarring. Centrilobular emphysema. Scattered mild peribronchovascular nodularity, bronchiectasis and architectural distortion bilaterally, worst in the posterior segment right upper lobe, as on the prior exam. No worrisome pulmonary nodules. No pleural fluid. Airway is otherwise unremarkable. Upper Abdomen: Visualized portions of the liver  and adrenal glands are unremarkable. Low-attenuation lesions in the left kidney measure up to 6.6 cm, similar but difficult to further characterize without post-contrast imaging or due to incomplete visualization. Visualized portions of the spleen, pancreas and stomach are grossly unremarkable. Musculoskeletal: No worrisome lytic or sclerotic lesions. IMPRESSION: 1. Scattered peribronchovascular nodularity, bronchiectasis and mild architectural distortion, similar to 01/31/2018. Findings may be post infectious in etiology or due to mycobacterium avium complex. 2. Aortic atherosclerosis (ICD10-170.0). Coronary artery calcification. 3.  Emphysema (ICD10-J43.9). Electronically Signed   By: Lorin Picket M.D.   On: 08/06/2018 14:51    Assessment & Plan:   There are no diagnoses linked to this encounter.   No orders of the defined types were placed in  this encounter.    Follow-up: No follow-ups on file.  Walker Kehr, MD

## 2019-02-26 NOTE — Assessment & Plan Note (Signed)
Levothroid 

## 2019-02-26 NOTE — Assessment & Plan Note (Signed)
Norvasc Not taking HCTZ

## 2019-03-11 DIAGNOSIS — M65341 Trigger finger, right ring finger: Secondary | ICD-10-CM | POA: Diagnosis not present

## 2019-03-11 DIAGNOSIS — M189 Osteoarthritis of first carpometacarpal joint, unspecified: Secondary | ICD-10-CM | POA: Insufficient documentation

## 2019-03-11 DIAGNOSIS — M180 Bilateral primary osteoarthritis of first carpometacarpal joints: Secondary | ICD-10-CM | POA: Diagnosis not present

## 2019-03-11 DIAGNOSIS — M79641 Pain in right hand: Secondary | ICD-10-CM | POA: Diagnosis not present

## 2019-03-20 DIAGNOSIS — H16302 Unspecified interstitial keratitis, left eye: Secondary | ICD-10-CM | POA: Diagnosis not present

## 2019-04-09 DIAGNOSIS — M79641 Pain in right hand: Secondary | ICD-10-CM | POA: Diagnosis not present

## 2019-04-09 DIAGNOSIS — M65341 Trigger finger, right ring finger: Secondary | ICD-10-CM | POA: Diagnosis not present

## 2019-04-09 DIAGNOSIS — M180 Bilateral primary osteoarthritis of first carpometacarpal joints: Secondary | ICD-10-CM | POA: Diagnosis not present

## 2019-04-10 ENCOUNTER — Ambulatory Visit (INDEPENDENT_AMBULATORY_CARE_PROVIDER_SITE_OTHER): Payer: Medicare Other

## 2019-04-10 ENCOUNTER — Other Ambulatory Visit: Payer: Self-pay

## 2019-04-10 DIAGNOSIS — Z23 Encounter for immunization: Secondary | ICD-10-CM | POA: Diagnosis not present

## 2019-04-22 DIAGNOSIS — H5212 Myopia, left eye: Secondary | ICD-10-CM | POA: Diagnosis not present

## 2019-04-22 DIAGNOSIS — H532 Diplopia: Secondary | ICD-10-CM | POA: Diagnosis not present

## 2019-04-22 DIAGNOSIS — H5201 Hypermetropia, right eye: Secondary | ICD-10-CM | POA: Diagnosis not present

## 2019-04-22 DIAGNOSIS — H52223 Regular astigmatism, bilateral: Secondary | ICD-10-CM | POA: Diagnosis not present

## 2019-04-25 ENCOUNTER — Ambulatory Visit (HOSPITAL_COMMUNITY)
Admission: EM | Admit: 2019-04-25 | Discharge: 2019-04-25 | Disposition: A | Discharge status: Home / Self Care | Payer: Medicare Other | Attending: Family Medicine | Admitting: Family Medicine

## 2019-04-25 ENCOUNTER — Encounter (HOSPITAL_COMMUNITY): Payer: Self-pay | Admitting: Family Medicine

## 2019-04-25 DIAGNOSIS — H539 Unspecified visual disturbance: Secondary | ICD-10-CM

## 2019-04-25 NOTE — ED Provider Notes (Addendum)
[facility]    CSN: [id number] Arrival date & time: 04/25/19  1423      History   Chief Complaint Chief Complaint  Patient presents with  . Eye Problem    HPI David Buck is a 78 y.o. male.   Initial visit for this 78 yo gentleman at [facility].  He is complaining of visual disturbance today.  Patient states that he has had some blurry vision and difficulty focusing for about 5 to 10 minutes after he wakes up each morning for the last 7 to 10 days.  He has had no headache and no other neurological symptoms.  When he looks in the mirror, he thinks it may be his right eye has a little more been drooped than normal body is not sure if this is real or imagined.  Patient has a history of syncope number of years ago when he got up quickly out of a chair, but otherwise has not had any significant neurological events.  He saw his optometrist, Marica Otter, several days ago who could not not find any abnormalities.  He is scheduled to see his PCP in another 4 days.  Past vascular studies:  Echocardiogram normal in 2019("Aortic valve:   Structurally normal valve.")  Elevated cholesterol (on statin therapy now)  Note: patient has "aortic atherosclerosis" on his problem list along with "TIA" Past metabolic studies:  Normal BMP in 2019 (patient on thyroid replacement); testosterone normal in 2017 Past neuro studies:  Normal B12 in 2019     Past Medical History:  Diagnosis Date  . Allergy    rhinitis  . COPD (chronic obstructive pulmonary disease) ([place])   . History of hepatitis B    recovered  . Hypertension   . Hypothyroidism   . 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 Meniere's disease   . Osteoarthritis of right knee    Dr Percell Miller  . Pneumonia    hx of 2/17   . Posterior vitreous detachment 2011   R, body- Dr Baird Cancer  . Prostatitis   . Tinnitus     Patient Active Problem List   Diagnosis Date Noted  . Coronary atherosclerosis 08/26/2018   . Aortic atherosclerosis ([place]) 08/10/2018  . Tinnitus 01/21/2018  . Syncope 12/02/2017  . Bradycardia   . Sebaceous cyst 03/08/2017  . Preop exam for internal medicine 12/13/2015  . Microhematuria 09/12/2015  . Fatigue 08/01/2015  . UTI (urinary tract infection) 08/01/2015  . Acute upper respiratory infection 08/01/2015  . CAP (community acquired pneumonia) 08/01/2015  . Libido, decreased 08/01/2015  . TIA (transient ischemic attack) 12/03/2014  . Vertigo 10/20/2014  . 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 Elevated PSA 11/30/2013  . Osteoarthritis of [place] joint of thumb 11/13/2013  . OA (osteoarthritis) of knee 11/13/2013  . Thumb pain 11/12/2013  . Knee pain 11/12/2013  . Essential hypertension 10/17/2012  . HTN (hypertension) 11/05/2011  . Well adult exam 11/03/2010  . 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 Lung nodule 01/30/2010  . SORE THROAT 06/24/2009  . TREMOR, ESSENTIAL 02/07/2009  . 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 ARM PAIN 02/07/2009  . Seasonal and perennial allergic rhinitis 07/13/2008  . ABNORMAL GLUCOSE NEC 07/13/2008  . SINUSITIS, ACUTE 02/03/2008  . TOBACCO USE DISORDER/SMOKER-SMOKING CESSATION DISCUSSED 10/02/2007  . WEIGHT LOSS, ABNORMAL 10/02/2007  . COPD mixed type ([place]) 08/04/2007  . ACQUIRED CYST OF KIDNEY 08/04/2007  . Hypothyroidism 07/24/2007  . RBBB 07/23/2007  . 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  OSTEOARTHRITIS 07/23/2007  . WHEEZING 07/23/2007  . Abnormal chest CT 07/23/2007  . CHEST XRAY, ABNORMAL 07/23/2007    Past Surgical History:  Procedure Laterality Date  . bilateral cataract surgery     . BRONCHOSCOPY  04-14-08   benign  . INGUINAL HERNIA REPAIR  2005   Dr Hassell Done- bilateral   . right knee menisc tear repair    . 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   Procedure: RIGHT TOTAL KNEE ARTHROPLASTY;  Surgeon: Gaynelle Arabian, MD;  Location: [facility];  Service: Orthopedics;  Laterality: Right;       Home Medications    Prior to Admission  medications   Medication Sig Start Date End Date Taking? Authorizing Provider  amLODipine (NORVASC) 2.5 MG tablet Take 0.5 tablets (1.25 mg total) by mouth daily. 08/26/18   [name], [name], MD  aspirin EC 81 MG tablet Take 81 mg by mouth daily.    [provider]  Cholecalciferol 2000 units TABS Take 1 tablet by mouth daily.    [provider]  Fluticasone-Umeclidin-Vilant (TRELEGY ELLIPTA) 100-62.5-25 MCG/INH AEPB Inhale 1 puff into the lungs daily. Rinse mouth 08/07/18   Baird Lyons D, MD  gabapentin (NEURONTIN) 100 MG capsule Take 1-2 capsules (100-200 mg total) by mouth 3 (three) times daily as needed. 02/25/18   [name], [name], MD  Glucosamine HCl 1000 MG TABS Take 1 tablet by mouth daily.    [provider]  hydrochlorothiazide (MICROZIDE) 12.5 MG capsule Take 1 capsule (12.5 mg total) by mouth daily. 08/26/18   [name], [name], MD  levothyroxine (SYNTHROID) 88 MCG tablet TAKE ONE TABLET BY MOUTH DAILY 10/23/18   [name], [name], MD  loratadine (CLARITIN) 10 MG tablet Take 10 mg by mouth daily.     [provider]  lovastatin (MEVACOR) 20 MG tablet Take 1 tablet (20 mg total) by mouth at bedtime. Patient not taking: Reported on 02/26/2019 08/26/18   [name], [name], MD  Multiple Vitamin (MULTIVITAMIN) tablet Take 1 tablet by mouth daily.    [provider]  vitamin C (ASCORBIC ACID) 500 MG tablet Take 500 mg by mouth daily.    [provider]    Family History Family History  Problem Relation Age of Onset  . Cancer Mother        ovarian and bowel  . Other Father        cerebral hemorrage  . Hypertension Other     Social History Social History   Tobacco Use  . Smoking status: Current Every Day Smoker    Packs/day: 1.00    Years: 50.00    Pack years: 50.00    Types: Cigarettes  . Smokeless tobacco: Never Used  Substance Use Topics  . Alcohol use: Yes    Comment: occasional   . Drug use: No      Allergies   Cefdinir and Cephalosporins   Review of Systems Review of Systems  Constitutional: Negative.   Eyes: Positive for visual disturbance.  Respiratory: Negative.   Cardiovascular: Negative.   Neurological: Negative.   All other systems reviewed and are negative.    Physical Exam Triage Vital Signs ED Triage Vitals  Enc Vitals Group     BP      Pulse      Resp      Temp      Temp src      SpO2      Weight      Height  Head Circumference      Peak Flow      Pain Score      Pain Loc      Pain Edu?      Excl. in [place]?    No data found.  Updated Vital Signs BP (!) 156/99 (BP Location: Left Arm)   Pulse 85   Temp 98 F (36.7 C) (Temporal)   Resp 16   SpO2 100%    Physical Exam Vitals signs and nursing note reviewed.  Constitutional:      Appearance: Normal appearance. He is normal weight.  HENT:     Head: Normocephalic and atraumatic.     Mouth/Throat:     Pharynx: Oropharynx is clear.  Eyes:     Extraocular Movements: Extraocular movements intact.     Conjunctiva/sclera: Conjunctivae normal.     Pupils: Pupils are equal, round, and reactive to light.  Neck:     Musculoskeletal: Normal range of motion and neck supple.     Vascular: No carotid bruit.  Cardiovascular:     Rate and Rhythm: Normal rate and regular rhythm.     Heart sounds: Normal heart sounds.  Pulmonary:     Effort: Pulmonary effort is normal.     Breath sounds: Normal breath sounds.  Musculoskeletal: Normal range of motion.  Lymphadenopathy:     Cervical: No cervical adenopathy.  Skin:    General: Skin is warm.  Neurological:     General: No focal deficit present.     Mental Status: He is alert and oriented to person, place, and time.     Cranial Nerves: No cranial nerve deficit.     Coordination: Coordination normal.     Gait: Gait normal.  Psychiatric:        Mood and Affect: Mood normal.        Behavior: Behavior normal.        Thought Content: Thought content  normal.        Judgment: Judgment normal.      UC Treatments / Results  Labs PSA Order: [id number]  Status:  Final result Visible to patient:  Yes (MyChart) Next appt:  04/30/2019 at 03:40 PM in Internal Medicine Walker Kehr, MD) Dx:  Elevated PSA  Ref Range & Units 76moago 127yrgo 2y64yro  PSA 0.10 - 4.00 ng/mL 3.23  2.84 CM  3.21   Comment: Test performed using Access Hybritech PSA Assay, a parmagnetic partical, chemiluminecent immunoassay.  Resulting Agency  [facility]      Specimen Collected: 12/31/18 15:47 Last Resulted: 12/31/18 17:08     Lab Flowsheet   Order Details   View Encounter   Lab and Collection Details   Routing   Result History     CM=Additional comments        Hepatic function panel Order: [id number]:  Final result Visible to patient:  Yes (MyChart) Next appt:  04/30/2019 at 03:40 PM in Internal Medicine (AlWalker KehrD) Dx:  Hypothyroidism, unspecified type  Ref Range & Units 97mo70mo (12/31/18) 75yr 75yr(01/23/18) 75yr a72yr5/9/19)  Total Bilirubin 0.2 - 1.2 mg/dL 0.6  0.6  0.7   Bilirubin, Direct 0.0 - 0.3 mg/dL 0.1  0.1  0.2   Alkaline Phosphatase 39 - 117 U/L 69  61  63   AST 0 - 37 U/L _0 ALT 0 - 53 U/L _1 Total Protein 6.0 -  8.3 g/dL 6.7  6.9  6.8   Albumin 3.5 - 5.2 g/dL 4.4  4.3  4.3   Resulting Agency  [facility]      Specimen Collected: 12/31/18 15:47 Last Resulted: 12/31/18 17:38     Lab Flowsheet   Order Details   View Encounter   Lab and Collection Details   Routing   Result History           Contains abnormal data Basic metabolic panel Order: [id number]  Status:  Final result Visible to patient:  Yes (MyChart) Next appt:  04/30/2019 at 03:40 PM in Internal Medicine Walker Kehr, MD) Dx:  Hypothyroidism, unspecified type  Ref Range & Units 71moago (12/31/18) 152yrgo (01/23/18) 1y58yro (12/04/17)   Sodium 135 - 145 mEq/L 139  137  141 R   Potassium 3.5 - 5.1 mEq/L 4.1  4.0  3.6 R   Chloride 96 - 112 mEq/L 106  105  109 R   CO2 19 - 32 mEq/L _0 R   Glucose, Bld 70 - 99 mg/dL 89  104High   92 R   BUN 6 - 23 mg/dL 25High   26High   23High  R   Creatinine, Ser 0.40 - 1.50 mg/dL 0.87  0.98  0.92 R   Calcium 8.4 - 10.5 mg/dL 9.4  9.7  9.1 R   GFR >60.00 mL/min 85.11  79.04    Resulting Agency  [place] [facility] [place] [facility]      Specimen Collected: 12/31/18 15:47 Last Resulted: 12/31/18 17:38     Lab Flowsheet   Order Details   View Encounter   Lab and Collection Details   Routing   Result History     R=Reference range differs from displayed range        Contains abnormal data TSH Order: [id number]:  Final result Visible to patient:  Yes (MyChart) Next appt:  04/30/2019 at 03:40 PM in Internal Medicine (AlWalker KehrD) Dx:  Hypothyroidism, unspecified type  Ref Range & Units 50mo79mo (12/31/18) 7yr 57yr(01/23/18) 7yr a15yr6/10/19)  TSH 0.35 - 4.50 uIU/mL 0.20Low   3.54  1.122 R, CM   Resulting Agency  [facility]      Specimen Collected: 12/31/18 15:47 Last Resulted: 12/31/18 17:08     Lab Flowsheet   Order Details   View Encounter   Lab and Collection Details   Routing   Result History     CM=Additional commentsR=Reference range differs from displayed range        Lipid panel Order: [id number]:  Final result Visible to patient:  Yes (MyChart) Next appt:  04/30/2019 at 03:40 PM in Internal Medicine (Alex Walker KehrDx:  Hypothyroidism, unspecified type  Ref Range & Units 50mo ag850mor ago87yr ago 28yrlesterol 0 - 200 mg/dL 114  128 CM  129 CM   Comment: ATP III Classification    Desirable: < 200 mg/dL        Borderline High: 200 - 239 mg/dL     High: > = 240 mg/dL  Triglycerides 0.0 - 149.0 mg/dL 57.0  45.0 CM  55.0 CM   Comment: Normal: <150  mg/dLBorderline High: 150 - 199 mg/dL  HDL >39.00 mg/dL 53.30  56.90  57.10   VLDL 0.0 - 40.0 mg/dL 11.4  9.0  11.0   LDL Cholesterol 0 - 99 mg/dL 49  63  61   Total CHOL/HDL Ratio  2  2 CM  2 CM   Comment:        Men     Women1/2 Average Risk   3.4     3.3Average Risk     5.0     4.42X Average Risk     9.6     7.13X Average Risk     15.0     11.0            NonHDL  60.31  71.50 CM  72.25 CM   Comment: NOTE: Non-HDL goal should be 30 mg/dL higher than patient's LDL goal (i.e. LDL goal of < 70 mg/dL, would have non-HDL goal of < 100 mg/dL)  Resulting Agency  [facility]      Specimen Collected: 12/31/18 15:47 Last Resulted: 12/31/18 17:38     Lab Flowsheet   Order Details   View Encounter   Lab and Collection Details   Routing   Result History     CM=Additional comments         Radiology COMPARISON:  01/31/2018 and 09/21/2015.  FINDINGS: Cardiovascular: Atherosclerotic calcification of the aorta and coronary arteries. Heart size normal. Small amount of pericardial fluid is unchanged and likely physiologic.  Mediastinum/Nodes: No pathologically enlarged mediastinal or axillary lymph nodes. Hilar regions are difficult to evaluate without IV contrast. Esophagus is grossly unremarkable.  Lungs/Pleura: Biapical pleuroparenchymal scarring. Centrilobular emphysema. Scattered mild peribronchovascular nodularity, bronchiectasis and architectural distortion bilaterally, worst in the posterior segment right upper lobe, as on the prior exam. No worrisome pulmonary nodules. No pleural fluid. Airway is otherwise unremarkable.  Upper Abdomen: Visualized portions of the liver and adrenal glands are unremarkable. Low-attenuation lesions in the left kidney measure up to 6.6 cm, similar but difficult to further characterize without post-contrast imaging or due to incomplete  visualization. Visualized portions of the spleen, pancreas and stomach are grossly unremarkable.  Musculoskeletal: No worrisome lytic or sclerotic lesions.  IMPRESSION: 1. Scattered peribronchovascular nodularity, bronchiectasis and mild architectural distortion, similar to 01/31/2018. Findings may be post infectious in etiology or due to mycobacterium avium complex. 2. Aortic atherosclerosis (ICD10-170.0). Coronary artery calcification. 3.  Emphysema (ICD10-J43.9).   Electronically Signed   By: Lorin Picket M.D.   On: 08/06/2018 14:51    Initial Impression / Assessment and Plan / UC Course  I have reviewed the triage vital signs and the nursing notes.  Pertinent labs & imaging results that were available during my care of the patient were reviewed by me and considered in my medical decision making (see chart for details).      Final Clinical Impressions(s) / UC Diagnoses   Final diagnoses:  Visual disturbance     Discharge Instructions     Your symptoms being transient each morning for the last 7-10 days and lasting just 5-10 minutes, in the context of normal echocardiogram 18 months ago and a normal MR angio of all the major blood vessels supplying the brain, I think that your symptoms are from a early morning low blood pressure and or slow pulse. I would follow up with your PCP but I don't think further imaging now is necessary.    ED Prescriptions    None     PDMP not reviewed this encounter.   Robyn Haber, MD 04/25/19 [place]    Robyn Haber, MD 04/25/19 [phone]

## 2019-04-25 NOTE — ED Triage Notes (Signed)
Pt present his eyes are not focusing together. Some times he is having double vision.

## 2019-04-25 NOTE — Discharge Instructions (Signed)
Your symptoms being transient each morning for the last 7-10 days and lasting just 5-10 minutes, in the context of normal echocardiogram 18 months ago and a normal MR angio of all the major blood vessels supplying the brain, I think that your symptoms are from a early morning low blood pressure and or slow pulse. I would follow up with your PCP but I don't think further imaging now is necessary.

## 2019-04-28 ENCOUNTER — Encounter: Payer: Self-pay | Admitting: Internal Medicine

## 2019-04-28 ENCOUNTER — Other Ambulatory Visit: Payer: Self-pay

## 2019-04-28 ENCOUNTER — Ambulatory Visit (INDEPENDENT_AMBULATORY_CARE_PROVIDER_SITE_OTHER): Payer: Medicare Other | Admitting: Internal Medicine

## 2019-04-28 DIAGNOSIS — H539 Unspecified visual disturbance: Secondary | ICD-10-CM

## 2019-04-28 DIAGNOSIS — G459 Transient cerebral ischemic attack, unspecified: Secondary | ICD-10-CM

## 2019-04-28 MED ORDER — ASPIRIN EC 81 MG PO TBEC: 81.0000 mg | DELAYED_RELEASE_TABLET | Freq: Two times a day (BID) | ORAL | 3 refills | Status: DC

## 2019-04-28 NOTE — Assessment & Plan Note (Signed)
Cont ASA - increase to 81 mg bid

## 2019-04-28 NOTE — Assessment & Plan Note (Signed)
?  Etiology -- a vision disturbance since x10 days: double vision, curving of lines, not focusing together at distance; worse in am. He went to see Dr Marica Otter (Optometry) - nl exam. It is worse in AM. He went to UC.   12/15/14 MRI/MRA brain -- IMPRESSION: 1. Normal MRA circle of Willis without evidence for significant proximal stenosis, aneurysm, or branch vessel occlusion. 2. Negative MRA of the neck without and with contrast. No focal stenosis or vascular injury is evident. The internal carotid arteries are normal bilaterally.   Other: None.  6/19 head CT IMPRESSION: 1. No acute intracranial abnormality. 2. Diffuse atrophy  Electronically Signed   By: San Morelle M.D.   On: 12/15/2014 21:51  6/19 ECHO: Study Conclusions: - Left ventricle: The cavity size was normal. Wall thickness was   normal. Systolic function was normal. The estimated ejection   fraction was in the range of 55% to 60%. - Aortic valve: Valve area (VTI): 3.35 cm^2. Valve area (Vmax):   3.46 cm^2. Valve area (Vmean): 3.46 cm^2. - Mitral valve: There was mild regurgitation. - Left atrium: The atrium was mildly dilated. - Right ventricle: The cavity size was mildly dilated. - Right atrium: The atrium was mildly dilated.  Ophth ref ASAP

## 2019-04-28 NOTE — Progress Notes (Signed)
Subjective:  Patient ID: David Buck, male    DOB: [birth year]  Age: 78 y.o. MRN: [id number]  CC: No chief complaint on file.   HPI David Buck presents for a vision disturbance since x10 days: double vision, not focusing together at distance; worse in am. He went to see Dr Marica Otter (Optometry) - nl exam. It is worse in AM. He went to UC. Pt reduced ASA to 81 mg/d  12/15/14 MRI/MRA brain -- IMPRESSION: 1. Normal MRA circle of Willis without evidence for significant proximal stenosis, aneurysm, or branch vessel occlusion. 2. Negative MRA of the neck without and with contrast. No focal stenosis or vascular injury is evident. The internal carotid arteries are normal bilaterally.   Other: None.  6/19 head CT IMPRESSION: 1. No acute intracranial abnormality. 2. Diffuse atrophy  Electronically Signed   By: San Morelle M.D.   On: 12/15/2014 21:51  6/19 ECHO: Study Conclusions: - Left ventricle: The cavity size was normal. Wall thickness was   normal. Systolic function was normal. The estimated ejection   fraction was in the range of 55% to 60%. - Aortic valve: Valve area (VTI): 3.35 cm^2. Valve area (Vmax):   3.46 cm^2. Valve area (Vmean): 3.46 cm^2. - Mitral valve: There was mild regurgitation. - Left atrium: The atrium was mildly dilated. - Right ventricle: The cavity size was mildly dilated. - Right atrium: The atrium was mildly dilated.   Outpatient Medications Prior to Visit  Medication Sig Dispense Refill  . amLODipine (NORVASC) 2.5 MG tablet Take 0.5 tablets (1.25 mg total) by mouth daily. 90 tablet 1  . aspirin EC 81 MG tablet Take 81 mg by mouth daily.    . Cholecalciferol 2000 units TABS Take 1 tablet by mouth daily.    . Fluticasone-Umeclidin-Vilant (TRELEGY ELLIPTA) 100-62.5-25 MCG/INH AEPB Inhale 1 puff into the lungs daily. Rinse mouth 60 each 12  . gabapentin (NEURONTIN) 100 MG capsule Take 1-2 capsules (100-200 mg total) by mouth 3  (three) times daily as needed. 120 capsule 3  . Glucosamine HCl 1000 MG TABS Take 1 tablet by mouth daily.    . hydrochlorothiazide (MICROZIDE) 12.5 MG capsule Take 1 capsule (12.5 mg total) by mouth daily. 90 capsule 3  . levothyroxine (SYNTHROID) 88 MCG tablet TAKE ONE TABLET BY MOUTH DAILY 90 tablet 3  . loratadine (CLARITIN) 10 MG tablet Take 10 mg by mouth daily.     Marland Kitchen lovastatin (MEVACOR) 20 MG tablet Take 1 tablet (20 mg total) by mouth at bedtime. 90 tablet 3  . Multiple Vitamin (MULTIVITAMIN) tablet Take 1 tablet by mouth daily.    . vitamin C (ASCORBIC ACID) 500 MG tablet Take 500 mg by mouth daily.     No facility-administered medications prior to visit.     ROS: Review of Systems  Constitutional: Negative for appetite change, fatigue and unexpected weight change.  HENT: Negative for congestion, nosebleeds, sneezing, sore throat and trouble swallowing.   Eyes: Positive for visual disturbance. Negative for photophobia and itching.  Respiratory: Negative for cough.   Cardiovascular: Negative for chest pain, palpitations and leg swelling.  Gastrointestinal: Negative for abdominal distention, blood in stool, diarrhea and nausea.  Genitourinary: Negative for frequency and hematuria.  Musculoskeletal: Negative for back pain, gait problem, joint swelling and neck pain.  Skin: Negative for rash.  Neurological: Negative for dizziness, tremors, speech difficulty and weakness.  Psychiatric/Behavioral: Negative for agitation, dysphoric mood and sleep disturbance. The patient is not nervous/anxious.  Objective:  BP 132/78 (BP Location: Left Arm, Patient Position: Sitting, Cuff Size: Large)   Pulse 83   Temp 97.7 F (36.5 C) (Oral)   Ht 6\' 2"  (1.88 m)   Wt 164 lb (74.4 kg)   SpO2 96%   BMI 21.06 kg/m   BP Readings from Last 3 Encounters:  04/28/19 132/78  04/25/19 (!) 156/99  02/26/19 140/86    Wt Readings from Last 3 Encounters:  04/28/19 164 lb (74.4 kg)  02/26/19 167  lb (75.8 kg)  08/26/18 166 lb (75.3 kg)    Physical Exam Constitutional:      General: He is not in acute distress.    Appearance: He is well-developed.     Comments: NAD  Eyes:     Conjunctiva/sclera: Conjunctivae normal.     Pupils: Pupils are equal, round, and reactive to light.  Neck:     Musculoskeletal: Normal range of motion.     Thyroid: No thyromegaly.     Vascular: No JVD.  Cardiovascular:     Rate and Rhythm: Normal rate and regular rhythm.     Heart sounds: Normal heart sounds. No murmur. No friction rub. No gallop.   Pulmonary:     Effort: Pulmonary effort is normal. No respiratory distress.     Breath sounds: Normal breath sounds. No wheezing or rales.  Chest:     Chest wall: No tenderness.  Abdominal:     General: Bowel sounds are normal. There is no distension.     Palpations: Abdomen is soft. There is no mass.     Tenderness: There is no abdominal tenderness. There is no guarding or rebound.  Musculoskeletal: Normal range of motion.        General: No tenderness.  Lymphadenopathy:     Cervical: No cervical adenopathy.  Skin:    General: Skin is warm and dry.     Findings: No rash.  Neurological:     Mental Status: He is alert and oriented to person, place, and time.     Cranial Nerves: No cranial nerve deficit.     Motor: No abnormal muscle tone.     Coordination: Coordination normal.     Gait: Gait normal.     Deep Tendon Reflexes: Reflexes are normal and symmetric.  Psychiatric:        Behavior: Behavior normal.        Thought Content: Thought content normal.        Judgment: Judgment normal.     Lab Results  Component Value Date   WBC 6.4 01/23/2018   HGB 13.5 01/23/2018   HCT 39.9 01/23/2018   PLT 249.0 01/23/2018   GLUCOSE 89 12/31/2018   CHOL 114 12/31/2018   TRIG 57.0 12/31/2018   HDL 53.30 12/31/2018   LDLCALC 49 12/31/2018   ALT 17 12/31/2018   AST 18 12/31/2018   NA 139 12/31/2018   K 4.1 12/31/2018   CL 106 12/31/2018    CREATININE 0.87 12/31/2018   BUN 25 (H) 12/31/2018   CO2 24 12/31/2018   TSH 0.20 (L) 12/31/2018   PSA 3.23 12/31/2018   INR 1.04 01/06/2016   HGBA1C 5.8 10/26/2010    No results found.  Assessment & Plan:   There are no diagnoses linked to this encounter.   No orders of the defined types were placed in this encounter.    Follow-up: No follow-ups on file.  Walker Kehr, MD

## 2019-04-29 ENCOUNTER — Encounter: Payer: Self-pay | Admitting: Internal Medicine

## 2019-04-29 DIAGNOSIS — H35033 Hypertensive retinopathy, bilateral: Secondary | ICD-10-CM | POA: Diagnosis not present

## 2019-04-29 DIAGNOSIS — H35371 Puckering of macula, right eye: Secondary | ICD-10-CM | POA: Diagnosis not present

## 2019-04-29 DIAGNOSIS — H35722 Serous detachment of retinal pigment epithelium, left eye: Secondary | ICD-10-CM | POA: Diagnosis not present

## 2019-04-29 DIAGNOSIS — H35362 Drusen (degenerative) of macula, left eye: Secondary | ICD-10-CM | POA: Diagnosis not present

## 2019-04-30 ENCOUNTER — Ambulatory Visit: Payer: Medicare Other | Admitting: Internal Medicine

## 2019-04-30 DIAGNOSIS — H532 Diplopia: Secondary | ICD-10-CM | POA: Diagnosis not present

## 2019-04-30 DIAGNOSIS — H5022 Vertical strabismus, left eye: Secondary | ICD-10-CM | POA: Diagnosis not present

## 2019-05-04 ENCOUNTER — Other Ambulatory Visit: Payer: Self-pay | Admitting: Optometry

## 2019-05-04 DIAGNOSIS — H532 Diplopia: Secondary | ICD-10-CM

## 2019-05-05 ENCOUNTER — Encounter: Payer: Self-pay | Admitting: Internal Medicine

## 2019-05-05 ENCOUNTER — Ambulatory Visit (INDEPENDENT_AMBULATORY_CARE_PROVIDER_SITE_OTHER): Payer: Medicare Other | Admitting: Internal Medicine

## 2019-05-05 ENCOUNTER — Other Ambulatory Visit: Payer: Self-pay

## 2019-05-05 DIAGNOSIS — F17200 Nicotine dependence, unspecified, uncomplicated: Secondary | ICD-10-CM

## 2019-05-05 DIAGNOSIS — I10 Essential (primary) hypertension: Secondary | ICD-10-CM

## 2019-05-05 NOTE — Assessment & Plan Note (Signed)
Amlodipine.

## 2019-05-05 NOTE — Assessment & Plan Note (Signed)
1/2 ppd - needs to stop

## 2019-05-05 NOTE — Progress Notes (Signed)
Subjective:  Patient ID: David Buck, male    DOB: Sep 19, 1940  Age: 78 y.o. MRN: [id number]  CC: No chief complaint on file.   HPI David Buck presents for vision change - no improvement Brain MRI - tomorrow  Outpatient Medications Prior to Visit  Medication Sig Dispense Refill  . amLODipine (NORVASC) 2.5 MG tablet Take 0.5 tablets (1.25 mg total) by mouth daily. 90 tablet 1  . aspirin EC 81 MG tablet Take 1 tablet (81 mg total) by mouth 2 (two) times daily. 100 tablet 3  . Cholecalciferol 2000 units TABS Take 1 tablet by mouth daily.    . Fluticasone-Umeclidin-Vilant (TRELEGY ELLIPTA) 100-62.5-25 MCG/INH AEPB Inhale 1 puff into the lungs daily. Rinse mouth 60 each 12  . gabapentin (NEURONTIN) 100 MG capsule Take 1-2 capsules (100-200 mg total) by mouth 3 (three) times daily as needed. 120 capsule 3  . Glucosamine HCl 1000 MG TABS Take 1 tablet by mouth daily.    . hydrochlorothiazide (MICROZIDE) 12.5 MG capsule Take 1 capsule (12.5 mg total) by mouth daily. 90 capsule 3  . levothyroxine (SYNTHROID) 88 MCG tablet TAKE ONE TABLET BY MOUTH DAILY 90 tablet 3  . loratadine (CLARITIN) 10 MG tablet Take 10 mg by mouth daily.     Marland Kitchen lovastatin (MEVACOR) 20 MG tablet Take 1 tablet (20 mg total) by mouth at bedtime. 90 tablet 3  . Multiple Vitamin (MULTIVITAMIN) tablet Take 1 tablet by mouth daily.    . vitamin C (ASCORBIC ACID) 500 MG tablet Take 500 mg by mouth daily.     No facility-administered medications prior to visit.     ROS: Review of Systems  Constitutional: Negative for appetite change, fatigue and unexpected weight change.  HENT: Negative for congestion, nosebleeds, sneezing, sore throat and trouble swallowing.   Eyes: Positive for visual disturbance. Negative for itching.  Respiratory: Negative for cough.   Cardiovascular: Negative for chest pain, palpitations and leg swelling.  Gastrointestinal: Negative for abdominal distention, blood in stool, diarrhea and  nausea.  Genitourinary: Negative for frequency and hematuria.  Musculoskeletal: Negative for back pain, gait problem, joint swelling and neck pain.  Skin: Negative for rash.  Neurological: Negative for dizziness, tremors, speech difficulty and weakness.  Psychiatric/Behavioral: Positive for sleep disturbance. Negative for agitation and dysphoric mood. The patient is not nervous/anxious.     Objective:  BP 130/76 (BP Location: Left Arm, Patient Position: Sitting, Cuff Size: Normal)   Pulse 79   Temp 98.3 F (36.8 C) (Oral)   Ht 6\' 2"  (1.88 m)   Wt 166 lb 0.6 oz (75.3 kg)   SpO2 97%   BMI 21.32 kg/m   BP Readings from Last 3 Encounters:  05/05/19 130/76  04/28/19 132/78  04/25/19 (!) 156/99    Wt Readings from Last 3 Encounters:  05/05/19 166 lb 0.6 oz (75.3 kg)  04/28/19 164 lb (74.4 kg)  02/26/19 167 lb (75.8 kg)    Physical Exam Constitutional:      General: He is not in acute distress.    Appearance: He is well-developed.     Comments: NAD  Eyes:     Conjunctiva/sclera: Conjunctivae normal.     Pupils: Pupils are equal, round, and reactive to light.  Neck:     Musculoskeletal: Normal range of motion.     Thyroid: No thyromegaly.     Vascular: No JVD.  Cardiovascular:     Rate and Rhythm: Normal rate and regular rhythm.     Heart  sounds: Normal heart sounds. No murmur. No friction rub. No gallop.   Pulmonary:     Effort: Pulmonary effort is normal. No respiratory distress.     Breath sounds: Normal breath sounds. No wheezing or rales.  Chest:     Chest wall: No tenderness.  Abdominal:     General: Bowel sounds are normal. There is no distension.     Palpations: Abdomen is soft. There is no mass.     Tenderness: There is no abdominal tenderness. There is no guarding or rebound.  Musculoskeletal: Normal range of motion.        General: No tenderness.  Lymphadenopathy:     Cervical: No cervical adenopathy.  Skin:    General: Skin is warm and dry.      Findings: No rash.  Neurological:     Mental Status: He is alert and oriented to person, place, and time.     Cranial Nerves: No cranial nerve deficit.     Motor: No abnormal muscle tone.     Coordination: Coordination normal.     Gait: Gait normal.     Deep Tendon Reflexes: Reflexes are normal and symmetric.  Psychiatric:        Behavior: Behavior normal.        Thought Content: Thought content normal.        Judgment: Judgment normal.   Grossly nonfocal Neuro exam double vision  Lab Results  Component Value Date   WBC 6.4 01/23/2018   HGB 13.5 01/23/2018   HCT 39.9 01/23/2018   PLT 249.0 01/23/2018   GLUCOSE 89 12/31/2018   CHOL 114 12/31/2018   TRIG 57.0 12/31/2018   HDL 53.30 12/31/2018   LDLCALC 49 12/31/2018   ALT 17 12/31/2018   AST 18 12/31/2018   NA 139 12/31/2018   K 4.1 12/31/2018   CL 106 12/31/2018   CREATININE 0.87 12/31/2018   BUN 25 (H) 12/31/2018   CO2 24 12/31/2018   TSH 0.20 (L) 12/31/2018   PSA 3.23 12/31/2018   INR 1.04 01/06/2016   HGBA1C 5.8 10/26/2010    No results found.  Assessment & Plan:     Follow-up: No follow-ups on file.  Walker Kehr, MD

## 2019-05-06 ENCOUNTER — Ambulatory Visit
Admission: RE | Admit: 2019-05-06 | Discharge: 2019-05-06 | Disposition: A | Discharge status: Home / Self Care | Payer: Medicare Other | Source: Ambulatory Visit | Attending: Ophthalmology | Admitting: Ophthalmology

## 2019-05-06 DIAGNOSIS — H532 Diplopia: Secondary | ICD-10-CM | POA: Diagnosis not present

## 2019-05-06 MED ORDER — GADOBENATE DIMEGLUMINE 529 MG/ML IV SOLN
15.0000 mL | Freq: Once | INTRAVENOUS | Status: AC | PRN
  Administered 2019-05-06: 15 mL via INTRAVENOUS

## 2019-05-08 ENCOUNTER — Encounter (INDEPENDENT_AMBULATORY_CARE_PROVIDER_SITE_OTHER): Payer: Medicare Other | Admitting: Ophthalmology

## 2019-05-11 DIAGNOSIS — H35373 Puckering of macula, bilateral: Secondary | ICD-10-CM | POA: Diagnosis not present

## 2019-05-11 DIAGNOSIS — H35431 Paving stone degeneration of retina, right eye: Secondary | ICD-10-CM | POA: Diagnosis not present

## 2019-05-11 DIAGNOSIS — H31092 Other chorioretinal scars, left eye: Secondary | ICD-10-CM | POA: Diagnosis not present

## 2019-05-11 DIAGNOSIS — H442E3 Degenerative myopia with other maculopathy, bilateral eye: Secondary | ICD-10-CM | POA: Diagnosis not present

## 2019-05-11 DIAGNOSIS — H15833 Staphyloma posticum, bilateral: Secondary | ICD-10-CM | POA: Diagnosis not present

## 2019-05-13 ENCOUNTER — Other Ambulatory Visit: Payer: Self-pay | Admitting: Internal Medicine

## 2019-05-13 DIAGNOSIS — H539 Unspecified visual disturbance: Secondary | ICD-10-CM

## 2019-05-17 ENCOUNTER — Emergency Department (HOSPITAL_BASED_OUTPATIENT_CLINIC_OR_DEPARTMENT_OTHER): Payer: Medicare Other

## 2019-05-17 ENCOUNTER — Encounter: Payer: Self-pay | Admitting: Internal Medicine

## 2019-05-17 ENCOUNTER — Encounter (HOSPITAL_BASED_OUTPATIENT_CLINIC_OR_DEPARTMENT_OTHER): Payer: Self-pay | Admitting: Emergency Medicine

## 2019-05-17 ENCOUNTER — Other Ambulatory Visit: Payer: Self-pay

## 2019-05-17 ENCOUNTER — Emergency Department (HOSPITAL_BASED_OUTPATIENT_CLINIC_OR_DEPARTMENT_OTHER)
Admission: EM | Admit: 2019-05-17 | Discharge: 2019-05-17 | Disposition: A | Discharge status: Home / Self Care | Payer: Medicare Other | Attending: Emergency Medicine | Admitting: Emergency Medicine

## 2019-05-17 DIAGNOSIS — Z20828 Contact with and (suspected) exposure to other viral communicable diseases: Secondary | ICD-10-CM | POA: Insufficient documentation

## 2019-05-17 DIAGNOSIS — I10 Essential (primary) hypertension: Secondary | ICD-10-CM | POA: Diagnosis not present

## 2019-05-17 DIAGNOSIS — E039 Hypothyroidism, unspecified: Secondary | ICD-10-CM | POA: Diagnosis not present

## 2019-05-17 DIAGNOSIS — Z96651 Presence of right artificial knee joint: Secondary | ICD-10-CM | POA: Diagnosis not present

## 2019-05-17 DIAGNOSIS — J449 Chronic obstructive pulmonary disease, unspecified: Secondary | ICD-10-CM | POA: Insufficient documentation

## 2019-05-17 DIAGNOSIS — R531 Weakness: Secondary | ICD-10-CM

## 2019-05-17 DIAGNOSIS — R61 Generalized hyperhidrosis: Secondary | ICD-10-CM | POA: Insufficient documentation

## 2019-05-17 DIAGNOSIS — R5383 Other fatigue: Secondary | ICD-10-CM | POA: Diagnosis not present

## 2019-05-17 DIAGNOSIS — R001 Bradycardia, unspecified: Secondary | ICD-10-CM | POA: Diagnosis not present

## 2019-05-17 DIAGNOSIS — F17210 Nicotine dependence, cigarettes, uncomplicated: Secondary | ICD-10-CM | POA: Diagnosis not present

## 2019-05-17 DIAGNOSIS — Z7982 Long term (current) use of aspirin: Secondary | ICD-10-CM | POA: Insufficient documentation

## 2019-05-17 DIAGNOSIS — Z79899 Other long term (current) drug therapy: Secondary | ICD-10-CM | POA: Insufficient documentation

## 2019-05-17 DIAGNOSIS — R42 Dizziness and giddiness: Secondary | ICD-10-CM | POA: Diagnosis not present

## 2019-05-17 LAB — BASIC METABOLIC PANEL
Anion gap: 7 (ref 5–15)
BUN: 19 mg/dL (ref 8–23)
CO2: 28 mmol/L (ref 22–32)
Calcium: 9.8 mg/dL (ref 8.9–10.3)
Chloride: 102 mmol/L (ref 98–111)
Creatinine, Ser: 0.97 mg/dL (ref 0.61–1.24)
GFR calc Af Amer: >60 mL/min (ref >60)
GFR calc non Af Amer: >60 mL/min (ref >60)
Glucose, Bld: 100 mg/dL — ABNORMAL HIGH (ref 70–99)
Potassium: 3.9 mmol/L (ref 3.5–5.1)
Sodium: 137 mmol/L (ref 135–145)

## 2019-05-17 LAB — CBC
HCT: 44.9 % (ref 39.0–52.0)
Hemoglobin: 14.3 g/dL (ref 13.0–17.0)
MCH: 30.8 pg (ref 26.0–34.0)
MCHC: 31.8 g/dL (ref 30.0–36.0)
MCV: 96.6 fL (ref 80.0–100.0)
Platelets: 280 10*3/uL (ref 150–400)
RBC: 4.65 MIL/uL (ref 4.22–5.81)
RDW: 12.5 % (ref 11.5–15.5)
WBC: 6.7 10*3/uL (ref 4.0–10.5)
nRBC: 0 % (ref 0.0–0.2)

## 2019-05-17 LAB — URINALYSIS, MICROSCOPIC (REFLEX)

## 2019-05-17 LAB — URINALYSIS, ROUTINE W REFLEX MICROSCOPIC
Bilirubin Urine: NEGATIVE
Glucose, UA: NEGATIVE mg/dL
Ketones, ur: NEGATIVE mg/dL
Leukocytes,Ua: NEGATIVE
Nitrite: NEGATIVE
Protein, ur: NEGATIVE mg/dL
Specific Gravity, Urine: <1.005 — ABNORMAL LOW (ref 1.005–1.030)
pH: 6.5 (ref 5.0–8.0)

## 2019-05-17 LAB — SARS CORONAVIRUS 2 AG (30 MIN TAT): SARS Coronavirus 2 Ag: NEGATIVE

## 2019-05-17 MED ORDER — IOHEXOL 350 MG/ML SOLN
100.0000 mL | Freq: Once | INTRAVENOUS | Status: AC | PRN
  Administered 2019-05-17: 100 mL via INTRAVENOUS

## 2019-05-17 NOTE — ED Notes (Signed)
Unsuccessful phlebotomy attempt LAC

## 2019-05-17 NOTE — Discharge Instructions (Signed)
It was our pleasure to provide your ER care today - we hope that you feel better.  Follow up with neurologist, neuro/ophthy specialist in the next couple weeks - you may try to call tomorrow to see if appointment can be moved forward.   Return to ER right away if worse, new symptoms, fevers, new or severe pain, new numbness/weakness, change in speech or vision, weak/fainting, chest pain, trouble breathing, or other concern.

## 2019-05-17 NOTE — ED Triage Notes (Signed)
Pt states he woke up feeling "out of sorts" today. States he had night sweats, weakness. Denies pain.

## 2019-05-17 NOTE — ED Notes (Signed)
Patient in CT

## 2019-05-17 NOTE — ED Provider Notes (Addendum)
[facility] EMERGENCY DEPARTMENT Provider Note   CSN: [id number] Arrival date & time: 05/17/19  1451     History   Chief Complaint Chief Complaint  Patient presents with  . Weakness    HPI David Buck is a 78 y.o. male.     HPI   David Buck is a 78 y.o. male, with a history of COPD, HTN, Mnire's disease, presenting to the ED with generalized weakness and fatigue upon waking this morning.  He also noticed some sweating that was abnormal for him. Over the course of the day, these symptoms have improved.  During my interview he notes he feels "a little cold and hungry." EMS was called to his house, patient refused transport, but patient's wife encouraged patient to come to the emergency room. Denies fever, cough, chest pain, shortness of breath, abdominal pain, N/V/D, dizziness, syncope, headache, vision loss, or any other complaints.   Past Medical History:  Diagnosis Date  . Allergy    rhinitis  . COPD (chronic obstructive pulmonary disease) ([place])   . History of hepatitis B    recovered  . Hypertension   . Hypothyroidism   . 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 Meniere's disease   . Osteoarthritis of right knee    Dr Percell Miller  . Pneumonia    hx of 2/17   . Posterior vitreous detachment 2011   R, body- Dr Baird Cancer  . Prostatitis   . Tinnitus     Patient Active Problem List   Diagnosis Date Noted  . Vision changes 04/28/2019  . Coronary atherosclerosis 08/26/2018  . Aortic atherosclerosis ([place]) 08/10/2018  . Tinnitus 01/21/2018  . Syncope 12/02/2017  . Bradycardia   . Sebaceous cyst 03/08/2017  . Preop exam for internal medicine 12/13/2015  . Microhematuria 09/12/2015  . Fatigue 08/01/2015  . UTI (urinary tract infection) 08/01/2015  . Acute upper respiratory infection 08/01/2015  . CAP (community acquired pneumonia) 08/01/2015  . Libido, decreased 08/01/2015  . TIA (transient ischemic attack) 12/03/2014  .  Vertigo 10/20/2014  . 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 Elevated PSA 11/30/2013  . Osteoarthritis of [place] joint of thumb 11/13/2013  . OA (osteoarthritis) of knee 11/13/2013  . Thumb pain 11/12/2013  . Knee pain 11/12/2013  . Essential hypertension 10/17/2012  . Well adult exam 11/03/2010  . 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 Lung nodule 01/30/2010  . SORE THROAT 06/24/2009  . TREMOR, ESSENTIAL 02/07/2009  . ARM PAIN 02/07/2009  . Seasonal and perennial allergic rhinitis 07/13/2008  . ABNORMAL GLUCOSE NEC 07/13/2008  . SINUSITIS, ACUTE 02/03/2008  . TOBACCO USE DISORDER/SMOKER-SMOKING CESSATION DISCUSSED 10/02/2007  . WEIGHT LOSS, ABNORMAL 10/02/2007  . COPD mixed type ([place]) 08/04/2007  . ACQUIRED CYST OF KIDNEY 08/04/2007  . Hypothyroidism 07/24/2007  . RBBB 07/23/2007  . 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 OSTEOARTHRITIS 07/23/2007  . WHEEZING 07/23/2007  . Abnormal chest CT 07/23/2007  . CHEST XRAY, ABNORMAL 07/23/2007    Past Surgical History:  Procedure Laterality Date  . bilateral cataract surgery     . BRONCHOSCOPY  04-14-08   benign  . INGUINAL HERNIA REPAIR  2005   Dr Hassell Done- bilateral   . right knee menisc tear repair    . 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   Procedure: RIGHT TOTAL KNEE ARTHROPLASTY;  Surgeon: Gaynelle Arabian, MD;  Location: [facility]S;  Service: Orthopedics;  Laterality: Right;        Home Medications  Prior to Admission medications   Medication Sig Start Date End Date Taking? Authorizing Provider  amLODipine (NORVASC) 2.5 MG tablet Take 0.5 tablets (1.25 mg total) by mouth daily. 08/26/18   [name], [name], MD  aspirin EC 81 MG tablet Take 1 tablet (81 mg total) by mouth 2 (two) times daily. 04/28/19   [name], [name], MD  Cholecalciferol 2000 units TABS Take 1 tablet by mouth daily.    [provider]  Fluticasone-Umeclidin-Vilant (TRELEGY ELLIPTA) 100-62.5-25 MCG/INH AEPB Inhale 1 puff into the lungs daily. Rinse mouth  08/07/18   Baird Lyons D, MD  gabapentin (NEURONTIN) 100 MG capsule Take 1-2 capsules (100-200 mg total) by mouth 3 (three) times daily as needed. 02/25/18   [name], [name], MD  Glucosamine HCl 1000 MG TABS Take 1 tablet by mouth daily.    [provider]  hydrochlorothiazide (MICROZIDE) 12.5 MG capsule Take 1 capsule (12.5 mg total) by mouth daily. 08/26/18   [name], [name], MD  levothyroxine (SYNTHROID) 88 MCG tablet TAKE ONE TABLET BY MOUTH DAILY 10/23/18   [name], [name], MD  loratadine (CLARITIN) 10 MG tablet Take 10 mg by mouth daily.     [provider]  lovastatin (MEVACOR) 20 MG tablet Take 1 tablet (20 mg total) by mouth at bedtime. 08/26/18   [name], [name], MD  Multiple Vitamin (MULTIVITAMIN) tablet Take 1 tablet by mouth daily.    [provider]  vitamin C (ASCORBIC ACID) 500 MG tablet Take 500 mg by mouth daily.    [provider]    Family History Family History  Problem Relation Age of Onset  . Cancer Mother        ovarian and bowel  . Other Father        cerebral hemorrage  . Hypertension Other     Social History Social History   Tobacco Use  . Smoking status: Current Every Day Smoker    Packs/day: 1.00    Years: 50.00    Pack years: 50.00    Types: Cigarettes  . Smokeless tobacco: Never Used  Substance Use Topics  . Alcohol use: Yes    Comment: occasional   . Drug use: No     Allergies   Cefdinir and Cephalosporins   Review of Systems Review of Systems  Constitutional: Positive for fatigue. Negative for chills, diaphoresis and fever.  Respiratory: Negative for cough and shortness of breath.   Cardiovascular: Negative for chest pain.  Gastrointestinal: Negative for abdominal pain, blood in stool, diarrhea, nausea and vomiting.  Genitourinary: Negative for decreased urine volume, dysuria, flank pain, frequency and hematuria.  Musculoskeletal: Negative for neck pain.  Neurological: Positive for  weakness. Negative for dizziness, syncope, light-headedness and headaches.  All other systems reviewed and are negative.    Physical Exam Updated Vital Signs BP (!) 163/94 (BP Location: Right Arm)   Pulse 76   Temp 98.6 F (37 C) (Oral)   Resp 16   Ht 6\' 2"  (1.88 m)   Wt 74.8 kg   SpO2 98%   BMI 21.18 kg/m   Physical Exam Vitals signs and nursing note reviewed.  Constitutional:      General: He is not in acute distress.    Appearance: He is well-developed. He is not diaphoretic.  HENT:     Head: Normocephalic and atraumatic.     Mouth/Throat:     Mouth: Mucous membranes are moist.     Pharynx: Oropharynx is clear.  Eyes:  Conjunctiva/sclera: Conjunctivae normal.  Neck:     Musculoskeletal: Neck supple.  Cardiovascular:     Rate and Rhythm: Normal rate and regular rhythm.     Pulses: Normal pulses.          Radial pulses are 2+ on the right side and 2+ on the left side.       Posterior tibial pulses are 2+ on the right side and 2+ on the left side.     Heart sounds: Normal heart sounds.  Pulmonary:     Effort: Pulmonary effort is normal. No respiratory distress.     Breath sounds: Normal breath sounds.  Abdominal:     Palpations: Abdomen is soft.     Tenderness: There is no abdominal tenderness. There is no guarding.  Musculoskeletal:     Right lower leg: No edema.     Left lower leg: No edema.  Lymphadenopathy:     Cervical: No cervical adenopathy.  Skin:    General: Skin is warm and dry.  Neurological:     Mental Status: He is alert.     Comments: Sensation grossly intact to light touch in the extremities. No noted speech deficits. No aphasia. Patient handles oral secretions without difficulty. No noted swallowing defects.  Equal grip strength bilaterally. Strength 5/5 in the upper extremities. Strength 5/5 in the lower extremities.  No gait disturbance.  Coordination intact with finger to nose testing.  There is some noted lag in the EOMs when the  right eye gazes to the left.  Patient states this has been present for several weeks.  He has no new deficits.  Cranial nerves III-XII otherwise grossly intact.  No facial droop.   Psychiatric:        Mood and Affect: Mood and affect normal.        Speech: Speech normal.        Behavior: Behavior normal.      ED Treatments / Results  Labs (all labs ordered are listed, but only abnormal results are displayed) Labs Reviewed  BASIC METABOLIC PANEL - Abnormal; Notable for the following components:      Result Value   Glucose, Bld 100 (*)    All other components within normal limits  URINALYSIS, ROUTINE W REFLEX MICROSCOPIC - Abnormal; Notable for the following components:   Specific Gravity, Urine <1.005 (*)    Hgb urine dipstick SMALL (*)    All other components within normal limits  URINALYSIS, MICROSCOPIC (REFLEX) - Abnormal; Notable for the following components:   Bacteria, UA FEW (*)    All other components within normal limits  CBC    EKG None  Radiology No results found.  Procedures Procedures (including critical care time)  Medications Ordered in ED Medications - No data to display   Initial Impression / Assessment and Plan / ED Course  I have reviewed the triage vital signs and the nursing notes.  Pertinent labs & imaging results that were available during my care of the patient were reviewed by me and considered in my medical decision making (see chart for details).  Clinical Course as of May 16 1804  Nancy Fetter May 17, 2019  1704 Spoke on the phone with Dr. Scheryl Marten, who identifies himself as a neurologist. He states he is concerned some of the patient's continual symptoms may be due to an aneurysm.  He is asking if we would mind obtaining CTA of the head.    [SJ]    Clinical Course User Index [SJ]  Lorayne Bender, PA-C        Patient presents with complaint of generalized weakness and fatigue.  This has improved. Patient is nontoxic appearing, afebrile, not  tachycardic, not tachypneic, not hypotensive, maintains excellent SPO2 on room air, and is in no apparent distress.  We discussed the low likelihood that a CTA of the head would add much value here in the ED, however, ultimately we decided to perform this study.  No acute focal deficits were noted on exam today.  Patient does note that over the course of the last 6 weeks or so he has had vision abnormalities that include wavy vision, mostly in the right eye, occasional double vision, and overall difficulty focusing.  He states he can focus each eye individually, but has trouble focusing them together.  He has been evaluated by to optometrist and has appointments with a neuro-ophthalmologist and a retina specialist.  Findings and plan of care discussed with Lajean Saver, MD. Dr. Ashok Cordia personally evaluated and examined this patient and Dr. Ashok Cordia took over care for this patient at the end of my shift.  Vitals:   05/17/19 1502 05/17/19 1503 05/17/19 1617  BP:  (!) 163/94 (!) 152/96  Pulse:  76 68  Resp:  16 16  Temp:  98.6 F (37 C)   TempSrc:  Oral   SpO2:  98% 97%  Weight: 74.8 kg    Height: 6\' 2"  (1.88 m)       Final Clinical Impressions(s) / ED Diagnoses   Final diagnoses:  None    ED Discharge Orders    None       Layla Maw 05/17/19 1814    Lorayne Bender, PA-C 05/17/19 1816    Lajean Saver, MD 05/18/19 1321

## 2019-05-17 NOTE — ED Notes (Signed)
Pt reports generalized weakness since getting up this am. Reports episode of feeling "warm" and sweating. EMS evaluated pt at his house around 2pm. Pt came to ED by Ernest. Denies pain throughout day

## 2019-05-24 ENCOUNTER — Other Ambulatory Visit: Payer: Medicare Other

## 2019-05-25 DIAGNOSIS — I4510 Unspecified right bundle-branch block: Secondary | ICD-10-CM | POA: Diagnosis not present

## 2019-05-25 DIAGNOSIS — I444 Left anterior fascicular block: Secondary | ICD-10-CM | POA: Diagnosis not present

## 2019-05-25 DIAGNOSIS — R5383 Other fatigue: Secondary | ICD-10-CM | POA: Diagnosis not present

## 2019-06-04 DIAGNOSIS — H532 Diplopia: Secondary | ICD-10-CM | POA: Diagnosis not present

## 2019-06-04 DIAGNOSIS — E039 Hypothyroidism, unspecified: Secondary | ICD-10-CM | POA: Diagnosis not present

## 2019-06-04 DIAGNOSIS — H4912 Fourth [trochlear] nerve palsy, left eye: Secondary | ICD-10-CM | POA: Diagnosis not present

## 2019-06-22 DIAGNOSIS — I471 Supraventricular tachycardia: Secondary | ICD-10-CM | POA: Diagnosis not present

## 2019-06-22 DIAGNOSIS — I472 Ventricular tachycardia: Secondary | ICD-10-CM | POA: Diagnosis not present

## 2019-06-29 ENCOUNTER — Encounter: Payer: Self-pay | Admitting: Internal Medicine

## 2019-06-29 ENCOUNTER — Ambulatory Visit (INDEPENDENT_AMBULATORY_CARE_PROVIDER_SITE_OTHER): Payer: Medicare Other | Admitting: Internal Medicine

## 2019-06-29 ENCOUNTER — Other Ambulatory Visit: Payer: Self-pay

## 2019-06-29 VITALS — BP 136/78 | HR 82 | Temp 97.7°F | Ht 74.0 in | Wt 168.0 lb

## 2019-06-29 DIAGNOSIS — R55 Syncope and collapse: Secondary | ICD-10-CM | POA: Diagnosis not present

## 2019-06-29 DIAGNOSIS — E0500 Thyrotoxicosis with diffuse goiter without thyrotoxic crisis or storm: Secondary | ICD-10-CM

## 2019-06-29 DIAGNOSIS — F17200 Nicotine dependence, unspecified, uncomplicated: Secondary | ICD-10-CM | POA: Diagnosis not present

## 2019-06-29 DIAGNOSIS — R5382 Chronic fatigue, unspecified: Secondary | ICD-10-CM

## 2019-06-29 DIAGNOSIS — E034 Atrophy of thyroid (acquired): Secondary | ICD-10-CM

## 2019-06-29 DIAGNOSIS — H0589 Other disorders of orbit: Secondary | ICD-10-CM | POA: Insufficient documentation

## 2019-06-29 NOTE — Assessment & Plan Note (Addendum)
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 While this does not completely rule out ocular myasthenia, it does make it less likely (though patients can have both thyroid eye disease and myasthenia since both are autoimmune disorders); this also implies that this is not a cranial mononeuropathy as first thought. Therefore, our plan is to have the patient come to see Jerre Simon, our orthoptist for consultation for possible prism to correct his diplopia if possible. He will return to see me as scheduled in February unless he needs me in the meantime. At that point we will sort out whether a single-fiber EMG is still indicated, since the patient also had episodic systemic weakness, though he reports that he has had no further episodes. He also states that his ptosis and diplopia have not been variable, but constant each day. We will keep his doctors informed of the work-up progresses. The patient understands the importance of calling his doctors if he notes any changes in signs or symptoms. 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 No proptosis or notable orbital apex crowding.  Electronically Signed By: Monte Fantasia M.D. On: 06/09/2019 05:43  Records reviewed Endocr ref Dr Altheimer

## 2019-06-29 NOTE — Assessment & Plan Note (Signed)
Off and on Records reviewed Endocr ref Dr Altheimer

## 2019-06-29 NOTE — Assessment & Plan Note (Signed)
Doing better.   

## 2019-06-29 NOTE — Assessment & Plan Note (Signed)
Records reviewed Endocr ref Dr Altheimer

## 2019-06-29 NOTE — Assessment & Plan Note (Signed)
A short V tach - Dr Beatriz Stallion [facility]) appt pending soon

## 2019-06-29 NOTE — Progress Notes (Signed)
Subjective:  Patient ID: David Buck, male    DOB: [birth year]  Age: 79 y.o. MRN: [id number]  CC: No chief complaint on file.   HPI David Buck presents for a doublevision. F/u HTN, dyslipidemia, hypothyroidism. 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 While this does not completely rule out ocular myasthenia, it does make it less likely (though patients can have both thyroid eye disease and myasthenia since both are autoimmune disorders); this also implies that this is not a cranial mononeuropathy as first thought. Therefore, our plan is to have the patient come to see Jerre Simon, our orthoptist for consultation for possible prism to correct his diplopia if possible. He will return to see me as scheduled in February unless he needs me in the meantime. At that point we will sort out whether a single-fiber EMG is still indicated, since the patient also had episodic systemic weakness, though he reports that he has had no further episodes. He also states that his ptosis and diplopia have not been variable, but constant each day. We will keep his doctors informed of the work-up progresses. The patient understands the importance of calling his doctors if he notes any changes in signs or symptoms. 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 No proptosis or notable orbital apex crowding.  Electronically Signed By: Monte Fantasia M.D. On: 06/09/2019 05:43  Outpatient Medications Prior to Visit  Medication Sig Dispense Refill  . amLODipine (NORVASC) 2.5 MG tablet Take 0.5 tablets (1.25 mg total) by mouth daily. 90 tablet 1  . aspirin EC 81 MG tablet Take 1 tablet (81 mg total) by mouth 2 (two) times daily. 100 tablet 3  . Cholecalciferol 2000 units TABS Take 1 tablet by mouth daily.    .  Fluticasone-Umeclidin-Vilant (TRELEGY ELLIPTA) 100-62.5-25 MCG/INH AEPB Inhale 1 puff into the lungs daily. Rinse mouth 60 each 12  . Glucosamine HCl 1000 MG TABS Take 1 tablet by mouth daily.    . hydrochlorothiazide (MICROZIDE) 12.5 MG capsule Take 1 capsule (12.5 mg total) by mouth daily. (Patient taking differently: Take 12.5 mg by mouth as needed. ) 90 capsule 3  . levothyroxine (SYNTHROID) 88 MCG tablet TAKE ONE TABLET BY MOUTH DAILY 90 tablet 3  . loratadine (CLARITIN) 10 MG tablet Take 10 mg by mouth daily.     . Multiple Vitamin (MULTIVITAMIN) tablet Take 1 tablet by mouth daily.    . vitamin C (ASCORBIC ACID) 500 MG tablet Take 500 mg by mouth daily.    Marland Kitchen gabapentin (NEURONTIN) 100 MG capsule Take 1-2 capsules (100-200 mg total) by mouth 3 (three) times daily as needed. (Patient not taking: Reported on 06/29/2019) 120 capsule 3  . lovastatin (MEVACOR) 20 MG tablet Take 1 tablet (20 mg total) by mouth at bedtime. (Patient not taking: Reported on 06/29/2019) 90 tablet 3   No facility-administered medications prior to visit.    ROS: Review of Systems  Constitutional: Negative for appetite change, fatigue and unexpected weight change.  HENT: Negative for congestion, nosebleeds, sneezing, sore throat and trouble swallowing.   Eyes: Positive for visual disturbance. Negative for itching.  Respiratory: Negative for cough.   Cardiovascular: Negative for chest pain, palpitations and leg swelling.  Gastrointestinal: Negative for abdominal distention, blood in stool, diarrhea and nausea.  Genitourinary: Negative for frequency and hematuria.  Musculoskeletal: Negative for back pain, gait problem, joint swelling and neck pain.  Skin: Negative for rash.  Neurological: Negative for dizziness, tremors, speech difficulty and weakness.  Psychiatric/Behavioral: Negative for agitation, dysphoric mood, sleep disturbance and suicidal ideas. The patient is not nervous/anxious.     Objective:  BP 136/78  (BP Location: Left Arm, Patient Position: Sitting, Cuff Size: Large)   Pulse 82   Temp 97.7 F (36.5 C) (Oral)   Ht 6\' 2"  (1.88 m)   Wt 168 lb (76.2 kg)   SpO2 96%   BMI 21.57 kg/m   BP Readings from Last 3 Encounters:  06/29/19 136/78  05/17/19 (!) 158/92  05/05/19 130/76    Wt Readings from Last 3 Encounters:  06/29/19 168 lb (76.2 kg)  05/17/19 165 lb (74.8 kg)  05/05/19 166 lb 0.6 oz (75.3 kg)    Physical Exam Constitutional:      General: He is not in acute distress.    Appearance: Normal appearance. He is well-developed.     Comments: NAD  Eyes:     Conjunctiva/sclera: Conjunctivae normal.     Pupils: Pupils are equal, round, and reactive to light.  Neck:     Thyroid: No thyromegaly.     Vascular: No JVD.  Cardiovascular:     Rate and Rhythm: Normal rate and regular rhythm.     Heart sounds: Normal heart sounds. No murmur. No friction rub. No gallop.   Pulmonary:     Effort: Pulmonary effort is normal. No respiratory distress.     Breath sounds: Normal breath sounds. No wheezing or rales.  Chest:     Chest wall: No tenderness.  Abdominal:     General: Bowel sounds are normal. There is no distension.     Palpations: Abdomen is soft. There is no mass.     Tenderness: There is no abdominal tenderness. There is no guarding or rebound.  Musculoskeletal:        General: No tenderness. Normal range of motion.     Cervical back: Normal range of motion.  Lymphadenopathy:     Cervical: No cervical adenopathy.  Skin:    General: Skin is warm and dry.     Findings: No rash.  Neurological:     Mental Status: He is alert and oriented to person, place, and time.     Cranial Nerves: No cranial nerve deficit.     Motor: No abnormal muscle tone.     Coordination: Coordination normal.     Gait: Gait normal.     Deep Tendon Reflexes: Reflexes are normal and symmetric.  Psychiatric:        Behavior: Behavior normal.        Thought Content: Thought content normal.         Judgment: Judgment normal.   patch over L eye Neck without masses Left groin without mass or tenderness  Lab Results  Component Value Date   WBC 6.7 05/17/2019   HGB 14.3 05/17/2019   HCT 44.9 05/17/2019   PLT 280 05/17/2019   GLUCOSE 100 (H) 05/17/2019   CHOL 114 12/31/2018   TRIG 57.0 12/31/2018   HDL 53.30 12/31/2018   LDLCALC 49 12/31/2018   ALT 17 12/31/2018   AST 18 12/31/2018   NA 137 05/17/2019   K 3.9 05/17/2019   CL 102 05/17/2019   CREATININE 0.97 05/17/2019   BUN 19 05/17/2019   CO2 28 05/17/2019   TSH 0.20 (L) 12/31/2018   PSA 3.23 12/31/2018  INR 1.04 01/06/2016   HGBA1C 5.8 10/26/2010    CT Angio Head W or Wo Contrast  Result Date: 05/17/2019 CLINICAL DATA:  Dizziness. Possible aneurysm. EXAM: CT ANGIOGRAPHY HEAD AND NECK TECHNIQUE: Multidetector CT imaging of the head and neck was performed using the standard protocol during bolus administration of intravenous contrast. Multiplanar CT image reconstructions and MIPs were obtained to evaluate the vascular anatomy. Carotid stenosis measurements (when applicable) are obtained utilizing NASCET criteria, using the distal internal carotid diameter as the denominator. CONTRAST:  164mL OMNIPAQUE IOHEXOL 350 MG/ML SOLN COMPARISON:  MR head 05/06/2019.  MRA head 12/16/2014. FINDINGS: CT HEAD FINDINGS Brain: No evidence of acute infarction, hemorrhage, hydrocephalus, extra-axial collection or mass lesion/mass effect. Vascular: Reported separately. Skull: No fracture or destructive lesion. Mastoids and middle ears are clear. Sinuses: Imaged portions are clear. Orbits: None Review of the MIP images confirms the above findings CTA NECK FINDINGS Aortic arch: Standard branching. Imaged portion shows no evidence of aneurysm or dissection. No significant stenosis of the major arch vessel origins. Heavily calcified plaque at the origin of both subclavians, probable 50% stenosis at the origin of the RIGHT subclavian. Right carotid  system: No evidence of dissection, stenosis (50% or greater) or occlusion. Minor atheromatous change at the bifurcation. Left carotid system: No evidence of dissection, stenosis (50% or greater) or occlusion. Minor atheromatous change the bifurcation. Vertebral arteries: LEFT dominant. No evidence of dissection, stenosis (50% or greater) or occlusion. Skeleton: Spondylosis. Other neck: Noncontributory. Upper chest: No mass or pneumothorax. Review of the MIP images confirms the above findings CTA HEAD FINDINGS Anterior circulation: No significant stenosis, proximal occlusion, aneurysm, or vascular malformation. Posterior circulation: No significant stenosis, proximal occlusion, aneurysm, or vascular malformation. Venous sinuses: As permitted by contrast timing, patent. Anatomic variants: None of significance. Review of the MIP images confirms the above findings IMPRESSION: 1. No extracranial or intracranial stenosis of significance. 2. No acute intracranial findings. 3. No evidence for intracranial aneurysm or other vascular malformation. Electronically Signed   By: Staci Righter M.D.   On: 05/17/2019 19:05   DG Chest 2 View  Result Date: 05/17/2019 CLINICAL DATA:  Dizziness with weakness and fatigue. EXAM: CHEST - 2 VIEW COMPARISON:  08/30/2017 FINDINGS: Lungs are hyperexpanded. Small focus of stable scarring in the right upper lobe. No evidence of pneumonia or pulmonary edema. No pleural effusion or pneumothorax. Cardiac silhouette is normal in size. No mediastinal or hilar masses. No evidence of adenopathy. Skeletal structures are intact. IMPRESSION: 1. No acute cardiopulmonary disease. 2. Hyperexpanded lungs consistent with COPD. Electronically Signed   By: Lajean Manes M.D.   On: 05/17/2019 18:41   CT Angio Neck W and/or Wo Contrast  Result Date: 05/17/2019 CLINICAL DATA:  Dizziness. Possible aneurysm. EXAM: CT ANGIOGRAPHY HEAD AND NECK TECHNIQUE: Multidetector CT imaging of the head and neck was  performed using the standard protocol during bolus administration of intravenous contrast. Multiplanar CT image reconstructions and MIPs were obtained to evaluate the vascular anatomy. Carotid stenosis measurements (when applicable) are obtained utilizing NASCET criteria, using the distal internal carotid diameter as the denominator. CONTRAST:  150mL OMNIPAQUE IOHEXOL 350 MG/ML SOLN COMPARISON:  MR head 05/06/2019.  MRA head 12/16/2014. FINDINGS: CT HEAD FINDINGS Brain: No evidence of acute infarction, hemorrhage, hydrocephalus, extra-axial collection or mass lesion/mass effect. Vascular: Reported separately. Skull: No fracture or destructive lesion. Mastoids and middle ears are clear. Sinuses: Imaged portions are clear. Orbits: None Review of the MIP images confirms the above findings CTA NECK FINDINGS Aortic  arch: Standard branching. Imaged portion shows no evidence of aneurysm or dissection. No significant stenosis of the major arch vessel origins. Heavily calcified plaque at the origin of both subclavians, probable 50% stenosis at the origin of the RIGHT subclavian. Right carotid system: No evidence of dissection, stenosis (50% or greater) or occlusion. Minor atheromatous change at the bifurcation. Left carotid system: No evidence of dissection, stenosis (50% or greater) or occlusion. Minor atheromatous change the bifurcation. Vertebral arteries: LEFT dominant. No evidence of dissection, stenosis (50% or greater) or occlusion. Skeleton: Spondylosis. Other neck: Noncontributory. Upper chest: No mass or pneumothorax. Review of the MIP images confirms the above findings CTA HEAD FINDINGS Anterior circulation: No significant stenosis, proximal occlusion, aneurysm, or vascular malformation. Posterior circulation: No significant stenosis, proximal occlusion, aneurysm, or vascular malformation. Venous sinuses: As permitted by contrast timing, patent. Anatomic variants: None of significance. Review of the MIP images  confirms the above findings IMPRESSION: 1. No extracranial or intracranial stenosis of significance. 2. No acute intracranial findings. 3. No evidence for intracranial aneurysm or other vascular malformation. Electronically Signed   By: Staci Righter M.D.   On: 05/17/2019 19:05    Assessment & Plan:   There are no diagnoses linked to this encounter.   No orders of the defined types were placed in this encounter.    Follow-up: No follow-ups on file.  Walker Kehr, MD

## 2019-06-29 NOTE — Patient Instructions (Addendum)
Sign up for [employer] ( via [employer] on [employer] or your tablet). If you don't have a Art therapist card  - go to [employer] branch. They will set you up in 15 minutes. It is free. You can check out books to read and to listen, check out magazines and newspapers, movies etc.

## 2019-07-01 DIAGNOSIS — I444 Left anterior fascicular block: Secondary | ICD-10-CM | POA: Diagnosis not present

## 2019-07-01 DIAGNOSIS — R9431 Abnormal electrocardiogram [ECG] [EKG]: Secondary | ICD-10-CM | POA: Diagnosis not present

## 2019-07-01 DIAGNOSIS — I471 Supraventricular tachycardia: Secondary | ICD-10-CM | POA: Diagnosis not present

## 2019-07-01 DIAGNOSIS — Z79899 Other long term (current) drug therapy: Secondary | ICD-10-CM | POA: Diagnosis not present

## 2019-07-01 DIAGNOSIS — F17210 Nicotine dependence, cigarettes, uncomplicated: Secondary | ICD-10-CM | POA: Diagnosis not present

## 2019-07-02 DIAGNOSIS — I444 Left anterior fascicular block: Secondary | ICD-10-CM | POA: Diagnosis not present

## 2019-07-06 ENCOUNTER — Telehealth: Payer: Self-pay

## 2019-07-06 NOTE — Telephone Encounter (Signed)
Pt would like [place] to know he is switching over to Dr. Tamala Julian off [address] with [phone]

## 2019-07-06 NOTE — Telephone Encounter (Signed)
Copied from [place] [phone]. Topic: General - Inquiry >> Jul 06, 2019  1:33 PM Richardo Priest, [place] wrote: Reason for CRM: Pt called in in regards to referral for endo. Please advise.

## 2019-07-07 DIAGNOSIS — Z23 Encounter for immunization: Secondary | ICD-10-CM | POA: Diagnosis not present

## 2019-07-10 DIAGNOSIS — I471 Supraventricular tachycardia: Secondary | ICD-10-CM | POA: Diagnosis not present

## 2019-07-10 DIAGNOSIS — R9431 Abnormal electrocardiogram [ECG] [EKG]: Secondary | ICD-10-CM | POA: Diagnosis not present

## 2019-07-13 ENCOUNTER — Other Ambulatory Visit: Payer: Self-pay

## 2019-07-13 ENCOUNTER — Encounter: Payer: Self-pay | Admitting: Neurology

## 2019-07-13 ENCOUNTER — Ambulatory Visit (INDEPENDENT_AMBULATORY_CARE_PROVIDER_SITE_OTHER): Payer: Medicare Other | Admitting: Neurology

## 2019-07-13 VITALS — BP 152/92 | HR 72 | Temp 97.0°F | Ht 74.0 in | Wt 169.0 lb

## 2019-07-13 DIAGNOSIS — F17200 Nicotine dependence, unspecified, uncomplicated: Secondary | ICD-10-CM | POA: Diagnosis not present

## 2019-07-13 DIAGNOSIS — H532 Diplopia: Secondary | ICD-10-CM

## 2019-07-13 DIAGNOSIS — J449 Chronic obstructive pulmonary disease, unspecified: Secondary | ICD-10-CM

## 2019-07-13 DIAGNOSIS — R634 Abnormal weight loss: Secondary | ICD-10-CM

## 2019-07-13 DIAGNOSIS — E039 Hypothyroidism, unspecified: Secondary | ICD-10-CM

## 2019-07-13 MED ORDER — PYRIDOSTIGMINE BROMIDE 60 MG PO TABS: 60.0000 mg | ORAL_TABLET | Freq: Three times a day (TID) | ORAL | 0 refills | Status: DC

## 2019-07-13 NOTE — Addendum Note (Signed)
Addended by: Inis Sizer D on: 07/13/2019 03:00 PM   Modules accepted: Orders

## 2019-07-13 NOTE — Progress Notes (Signed)
SLEEP MED[facility]INE CLINIC    Provider:  Larey Seat, MD  Primary Care Physician:  Cassandria Anger, MD [place] [address]     Referring Provider: Cassandria Anger, [address] [place],  [place] [address]          Chief Complaint according to patient   Patient presents with:    . New Patient (Initial Visit)     10. pt presents for double vision, and ptosis of the right eye, which  started  in October  2020. he saw Neuro-ophthalmologist Dr. Sanda Klein at [place],  MD in October,  PCP in November.   Had multiple work ups- has been in ER twice with excessive weakness.  [facility] cadiologist evaluated him, thus far negative awaiting endocrinology consultation.       HISTORY OF PRESENT ILLNESS:  David Buck is a 79 y.o. year old Caucasian male patient seen on  07/13/2019 from Dr Alain Marion for a diplopia evaluation.    He  has a past medical history of Allergy, COPD (chronic obstructive pulmonary disease) ([place]), History of hepatitis B, Hypertension, Hypothyroidism, LAFB (left anterior fascicular block) (2008), Meniere's disease, Osteoarthritis of right knee, Pneumonia, Posterior vitreous detachment (2011), Prostatitis, and Tinnitus..  David Buck. David Buck is a slender, well-dressed well-groomed 79 year old right-handed Caucasian male patient who developed double vision in autumn of last year.  He underwent through Dr. Duane Lope office neuro-ophthalmology at [facility], and at that he collector receptor antibody test which was -0.0 David was for binding antibodies only he also had a multidetector CT image evaluate for thyroid eye disease given that he has a history of hypothyroidism.  The CT scan revealed thyroid eye disease is seen most likely explanation for his ocular misalignment.  It also implied that there was not a cranial 1 neuropathy as first thought.  He was referred to see Jerre Simon for consultation for possible prisms.   The patient had posed a question by email to his neuro-ophthalmologist asking if the diagnosis of thyroid orbitopathy was the definite at the time and also what kind of treatment should be initiated.  Usually thyroid disease changes very slowly and is not rapidly progressing and he was told that David is exactly the case also but his TSH was in normal range was slightly elevated at 0.56 international units/mL.  Dr. Alain Marion records show that he had no other physical symptoms at the time he presented to the office, blood tests for CBC without differential were normal range, basic metabolic panel as of July 2020 was in normal range, he underwent a CT angiography of the head and neck which did not show any aneurysm or abnormal findings.  No evidence of dissection dissection, stenosis or occlusion.  I reviewed the patient's medication list earlier today, I have the original report for the CT angio neck and for the MRI with and without contrast.  Also a list of medications as of today updated is also available.     Review of Systems: Out of a complete 14 system review, the patient complains of only the following symptoms, and all other reviewed systems are negative.:   David Buck that visit a 30 maximum 40-day.  Diplopia head first occurred at the end progressively more more frequent.  Visual images were no longer focused, deviations less noticeable when he looked to the left and downwards.  He has near vision for good distance in the right ear under  30 degrees but has deteriorated also here somewhat.  Uses reading glasses which are expected at age.  He has rare occasions of brief mild headaches surrounding the right eye socket, and he has started to wear an eye patch alternating between the left and right eye.  He Buck that one eye is more suited to words the range vision and one is for distance vision, the distance vision however makes the diplopia much more noticeable.  Occasionally there is some  puffiness under the eyes primarily the left and a rare occurrence on the right he stated that he has also noticed some occasional tremor in the left hand especially when typing.  And occasional.  Of generalized weakness that he has not experienced in the past and that are more frequent recurring.      Social History   Socioeconomic History  . Marital status: Married    Spouse name: Not on file  . Number of children: Not on file  . Years of education: Not on file  . Highest education level: Not on file  Occupational History  . Not on file  Tobacco Use  . Smoking status: Current Every Day Smoker    Packs/day: 1.00    Years: 50.00    Pack years: 50.00    Types: Cigarettes  . Smokeless tobacco: Never Used  Substance and Sexual Activity  . Alcohol use: Yes    Comment: occasional   . Drug use: No  . Sexual activity: Not on file  Other Topics Concern  . Not on file  Social History Narrative  . Not on file   Social Determinants of Health   Financial Resource Strain:   . Difficulty of Paying Living Expenses: Not on file  Food Insecurity:   . Worried About Charity fundraiser in the Last Year: Not on file  . Ran Out of Food in the Last Year: Not on file  Transportation Needs:   . Lack of Transportation (Medical): Not on file  . Lack of Transportation (Non-Medical): Not on file  Physical Activity:   . Days of Exercise per Week: Not on file  . Minutes of Exercise per Session: Not on file  Stress:   . Feeling of Stress : Not on file  Social Connections:   . Frequency of Communication with Friends and Family: Not on file  . Frequency of Social Gatherings with Friends and Family: Not on file  . Attends Religious Services: Not on file  . Active Member of Clubs or Organizations: Not on file  . Attends Archivist Meetings: Not on file  . Marital Status: Not on file    Family History  Problem Relation Age of Onset  . Cancer Mother        ovarian and bowel  . Other  Father        cerebral hemorrage  . Hypertension Other     Past Medical History:  Diagnosis Date  . Allergy    rhinitis  . COPD (chronic obstructive pulmonary disease) ([place])   . History of hepatitis B    recovered  . Hypertension   . Hypothyroidism   . 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 Meniere's disease   . Osteoarthritis of right knee    Dr Percell Miller  . Pneumonia    hx of 2/17   . Posterior vitreous detachment 2011   R, body- Dr Baird Cancer  . Prostatitis   . Tinnitus  Past Surgical History:  Procedure Laterality Date  . bilateral cataract surgery     . BRONCHOSCOPY  04-14-08   benign  . INGUINAL HERNIA REPAIR  2005   Dr Hassell Done- bilateral   . right knee menisc tear repair    . 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   Procedure: RIGHT TOTAL KNEE ARTHROPLASTY;  Surgeon: Gaynelle Arabian, MD;  Location: [facility];  Service: Orthopedics;  Laterality: Right;     Current Outpatient Medications on File Prior to Visit  Medication Sig Dispense Refill  . amLODipine (NORVASC) 2.5 MG tablet Take 0.5 tablets (1.25 mg total) by mouth daily. 90 tablet 1  . aspirin EC 81 MG tablet Take 1 tablet (81 mg total) by mouth 2 (two) times daily. 100 tablet 3  . Cholecalciferol 2000 units TABS Take 1 tablet by mouth daily.    . Glucosamine HCl 1000 MG TABS Take 1 tablet by mouth daily.    . hydrochlorothiazide (MICROZIDE) 12.5 MG capsule Take 1 capsule (12.5 mg total) by mouth daily. (Patient taking differently: Take 12.5 mg by mouth as needed. ) 90 capsule 3  . levothyroxine (SYNTHROID) 88 MCG tablet TAKE ONE TABLET BY MOUTH DAILY 90 tablet 3  . loratadine (CLARITIN) 10 MG tablet Take 10 mg by mouth daily.     . Multiple Vitamin (MULTIVITAMIN) tablet Take 1 tablet by mouth daily.    . vitamin C (ASCORBIC ACID) 500 MG tablet Take 500 mg by mouth daily.     No current facility-administered medications on file prior to visit.    Allergies  Allergen Reactions  . Cefdinir Rash     REACTION: Rash  . Cephalosporins Other (See Comments)    unknown    Physical exam:  Today's Vitals   07/13/19 1405  BP: (!) 152/92  Pulse: 72  Temp: (!) 97 F (36.1 C)  Weight: 169 lb (76.7 kg)  Height: 6\' 2"  (1.88 m)   Body mass index is 21.7 kg/m.   Wt Readings from Last 3 Encounters:  07/13/19 169 lb (76.7 kg)  06/29/19 168 lb (76.2 kg)  05/17/19 165 lb (74.8 kg)     Ht Readings from Last 3 Encounters:  07/13/19 6\' 2"  (1.88 m)  06/29/19 6\' 2"  (1.88 m)  05/17/19 6\' 2"  (1.88 m)      General: The patient is awake, alert and appears not in acute distress. The patient is well groomed. Head: Normocephalic, atraumatic. Neck is supple.  Mallampati 3,  Nasal airflow patent.  Retrognathia is not seen.  Dental status: intact. Cardiovascular:  Regular rate and cardiac rhythm by pulse,  without distended neck veins. Respiratory: Lungs are clear to auscultation.  Skin:  Without evidence of ankle edema, or rash. Trunk: The patient's posture is erect.   Neurologic exam : The patient is awake and alert, oriented to place and time.   Memory subjective described as intact.  Attention span & concentration ability appears normal.  Speech is fluent,  without  dysarthria, dysphonia or aphasia.  Mood and affect are appropriate.   Cranial nerves: no loss of smell or taste reported  Pupils are equal and briskly reactive to light. Funduscopic exam deferred.   Extraocular movements in vertical and horizontal planes were disconjugate with the left eye moving in and up upon looking straight ahead. And the right eye lagging in response to horizontal movements. Titubation  Of the head is noted. Diplopia. Visual fields by finger perimetry are intact. Hearing was intact to soft voice and finger rubbing.  Tinnitus is present.  Facial sensation intact to fine touch.  Facial motor strength is symmetric and tongue and uvula move midline.  Neck ROM : rotation, tilt and flexion extension were  normal for age and shoulder shrug was symmetrical.    Motor exam:  Symmetric bulk, and ROM.  More biceps tone noted on the left- cog-wheeling. Both hands have a tremor. symmetric grip strength . Pronator drift - mildly noted on the left.    Sensory:  Fine touch, pinprick and vibration were  normal.  Proprioception tested in the upper extremities was normal.  Coordination: Rapid alternating movements in the fingers/hands were of normal speed.  The Finger-to-nose maneuver was intact without evidence of ataxia, dysmetria . There is a tremor in both hands, not at rest- titubation and these bilateral tremors are likely related.   Gait and station: Patient could rise unassisted from a seated position, walked without assistive device.  Stance is of normal width/ base and the patient turned with 3 steps.  Toe and heel walk were deferred.  He has no stairs at home, he is not reporting SOB.  Deep tendon reflexes: in the  upper and lower extremities are symmetric and intact.  Babinski response was deferred.       EKG Reconciliation Chest Pain (07/02/2019 3:48 AM EST) EKG Reconciliation Chest Pain (07/02/2019 3:48 AM EST)  Specimen     EKG Reconciliation Chest Pain (07/02/2019 3:48 AM EST)  Narrative Performed At  Ventricular Rate          72    BPM           Atrial Rate            72    BPM           P-R Interval            190    ms           QRS Duration            94    ms           Q-T Interval            406    ms           QTC                444    ms           P Axis               82    degrees         R Axis               -74    degrees         T Axis               73    degrees           Sinus rhythm    Left anterior fascicular block    Poor progress of  the R waves across the precordium RBBB   01-Jul-2019)        When compared with ECG of 01-Jul-2019 14:42,    No significant change was found    Confirmed by Howell Rucks ([id number]) on 07/02/2019 8:18:47 AM         After spending a total time of  45 minutes face to face and additional time for physical and neurologic examination, review of  laboratory studies,  personal review of imaging studies, Buck and results of other testing and review of referral information / records as far as provided in visit, I have established the following assessments:  1)   Following the imaging studies and dr Earlie Server clinical judgement, 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  David fits the elevated TSH.  Binding Ab ACH were negative.   There are more tests , including musk and Ach blocking Ab.  Also needs evaluation by NCV and EMG for generalized, sero negative myasthenia rule -out. . Singe fiber EMG cannot be performed at [facility] and will need to be ordered through [facility].    2)He may have these symptoms due to the thyroid hypofunction- but  I am not sure why he developed titubation, tinnitus and tremor as well. I believe David is a non parkinsonian manifestation. The patient's great rand mother had Parkinson's - and he is not aware of any tremor patient's in his family otherwise. Sister has MS.     My Plan is to proceed with:  1) musk ab 2) EMG and NCV 3) try Mestinon?   I would like to thank Dr Jolyn Nap, MD -   [place], [name], [place],  [place] [address] for allowing me to meet with and to take care of David pleasant patient.   In short, David Buck is presenting with diplopia, worsening with gaze to the left and down- and with distance focus.  I plan to follow up either personally or through our NP within 2-3 month.   CC: I will share my notes with PCP.  Electronically signed by: Larey Seat, MD 07/13/2019 2:09 PM  [facility] Board certified by The [employer] of Sleep Medicine and Diplomate of the [employer] of Sleep Medicine. Board certified In Neurology through the Buck, Fellow of the [employer] of Neurology. Medical Director of [employer].

## 2019-07-13 NOTE — Patient Instructions (Addendum)
Pyridostigmine tablets, regular release What is this medicine? PYRIDOSTIGMINE (peer id oh STIG meen) can help with muscle strength. It is used to treat myasthenia gravis. This medicine may be used for other purposes; ask your health care provider or pharmacist if you have questions. COMMON BRAND NAME(S): Mestinon What should I tell my health care provider before I take this medicine? They need to know if you have any of these conditions:  asthma  difficulty passing urine  heart disease  infection in abdomen, peritonitis  irregular, slow heartbeat  kidney disease  seizures  stomach or bowel obstruction or ulcers  thyroid disease  Diplopia Diplopia is a condition in which a person sees two of a single object. It is also called double vision. There are two types of diplopia.  Monocular diplopia. This is double vision that affects only one eye. Monocular diplopia is often caused by a clouding of the lens in your eye (cataract) or by a problem in the way your eye focuses light.  Binocular diplopia. This is double vision that affects both eyes. However, when you shut one eye, the double vision will go away. Binocular diplopia may be more serious. It can be caused by: ? Problems with the nerves or muscles that are responsible for eye movement. ? Disease of the nerves (neurologic disease). ? Immune system conditions, such as Graves' disease. ? Migraine headaches. ? Tumors. ? An infection. ? A stroke. ? An injury. There are many causes of diplopia. Some are not dangerous and can be easily corrected. Diplopia may also be a symptom of a serious medical problem. You may need to see a health care provider who specializes in eye conditions (ophthalmologist) or a nerve specialist (neurologist) to find the cause. Follow these instructions at home:   Pay attention to any changes in your vision. Tell your health care provider about them.  Do not drive or operate heavy machinery if  diplopia interferes with your vision.  Keep all follow-up visits as told by your health care provider. This is important. Contact a health care provider if:  Your diplopia gets worse.  You develop any other symptoms along with your diplopia, such as: ? Weakness. ? Numbness. ? Headache. ? Eye pain. ? Clumsiness. ? Nausea. ? Drooping eyelids. ? Abnormal movement of one eye. Get help right away if you:  Have sudden vision loss.  Suddenly get a very bad headache.  Have sudden weakness or numbness.  Suddenly lose the ability to speak, understand speech, or both. These symptoms may represent a serious problem that is an emergency. Do not wait to see if the symptoms will go away. Get medical help right away. Call your local emergency services (911 in the [place] Do not drive yourself to the hospital. Summary  Diplopia is a condition in which a person sees two of a single object. It is also called double vision.  Monocular diplopia is double vision that affects only one eye. It is often caused by a clouding of the lens in your eye (cataract) or by a problem in the way your eye focuses light.  Binocular diplopia is double vision that affects both eyes. However, when you shut one eye, the double vision will go away. Binocular diplopia may be more serious.  If you have diplopia, you may need to see a health care provider who specializes in eye conditions (ophthalmologist) or a nerve specialist (neurologist) to find the cause. This information is not intended to replace advice given to you by  your health care provider. Make sure you discuss any questions you have with your health care provider. Document Revised: 06/05/2017 Document Reviewed: 06/05/2017 Elsevier Patient Education  [employer].  an unusual or allergic reaction to pyridostigmine, bromides, other medicines, foods, dyes, or preservatives  pregnant or trying to get pregnant  breast-feeding How should I use this  medicine? Take this medicine by mouth with a glass of water. Follow the directions on the prescription label. Take your medicine at regular intervals. Do not take your medicine more often than directed. Do not stop taking except on your doctor's advice. Talk to your pediatrician regarding the use of this medicine in children. Special care may be needed. Overdosage: If you think you have taken too much of this medicine contact a poison control center or emergency room at once. NOTE: This medicine is only for you. Do not share this medicine with others. What if I miss a dose? If you miss a dose, take it as soon as you can. If it is almost time for your next dose, take only that dose. Do not take double or extra doses. What may interact with this medicine? Do not take this medicine with any of the following medications:  other medicines for myasthenia gravis like neostigmine  quinine This medicine may also interact with the following medications:  atropine  bethanechol  disopyramide  edrophonium  guanadrel  guanethidine  mecamylamine  medicines that block muscle or nerve pain This list may not describe all possible interactions. Give your health care provider a list of all the medicines, herbs, non-prescription drugs, or dietary supplements you use. Also tell them if you smoke, drink alcohol, or use illegal drugs. Some items may interact with your medicine. What should I watch for while using this medicine? Visit your doctor or health care professional for regular checks on your progress. Tell your doctor if your symptoms do not improve or if they get worse. Wear a medical ID bracelet or chain, and carry a card that describes your disease and details of your medicine and dosage times. What side effects may I notice from receiving this medicine? Side effects that you should report to your doctor or health care professional as soon as possible:  allergic reactions like skin rash,  itching or hives, swelling of the face, lips, or tongue  breathing problems  changes in vision  muscle cramps, spasm  slow or irregular heartbeat  stomach cramps, pain  unusually weak or tired  vomiting Side effects that usually do not require medical attention (report to your doctor or health care professional if they continue or are bothersome):  diarrhea, especially at start of treatment  increased saliva  increased sweating  nausea This list may not describe all possible side effects. Call your doctor for medical advice about side effects. You may report side effects to FDA at 1-800-FDA-1088. Where should I keep my medicine? Keep out of the reach of children. Store at room temperature between 15 and 30 degrees C (59 and 86 degrees F). Keep container tightly closed. Throw away any unused medicine after the expiration date. NOTE: This sheet is a summary. It may not cover all possible information. If you have questions about this medicine, talk to your doctor, pharmacist, or health care provider.  2020 Elsevier/Gold Standard (2008-01-08 15:11:50)

## 2019-07-16 ENCOUNTER — Encounter (INDEPENDENT_AMBULATORY_CARE_PROVIDER_SITE_OTHER): Payer: Medicare Other | Admitting: Diagnostic Neuroimaging

## 2019-07-16 ENCOUNTER — Other Ambulatory Visit: Payer: Self-pay

## 2019-07-16 ENCOUNTER — Ambulatory Visit (INDEPENDENT_AMBULATORY_CARE_PROVIDER_SITE_OTHER): Payer: Medicare Other | Admitting: Diagnostic Neuroimaging

## 2019-07-16 DIAGNOSIS — H532 Diplopia: Secondary | ICD-10-CM | POA: Diagnosis not present

## 2019-07-16 DIAGNOSIS — F17200 Nicotine dependence, unspecified, uncomplicated: Secondary | ICD-10-CM

## 2019-07-16 DIAGNOSIS — Z0289 Encounter for other administrative examinations: Secondary | ICD-10-CM

## 2019-07-16 DIAGNOSIS — E039 Hypothyroidism, unspecified: Secondary | ICD-10-CM

## 2019-07-16 DIAGNOSIS — J449 Chronic obstructive pulmonary disease, unspecified: Secondary | ICD-10-CM

## 2019-07-16 DIAGNOSIS — R634 Abnormal weight loss: Secondary | ICD-10-CM

## 2019-07-17 NOTE — Progress Notes (Signed)
I like to do a pyridostigmine challenge - oral - patient has already been told during visit that that would be next step if labs come back negative. I order EMG/NCV with repeat stimulation for myasthenia.

## 2019-07-21 ENCOUNTER — Telehealth: Payer: Self-pay | Admitting: Neurology

## 2019-07-21 NOTE — Progress Notes (Signed)
Abnormal study demonstrating:  -No electrodiagnostic evidence of neuromuscular junction disorder  based on normal repetitive nerve stimulation testing.  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

## 2019-07-21 NOTE — Telephone Encounter (Signed)
Called to advise the patient of lab results. The wife answered, per DPR I was able to review the lab work with her. Advised that since lab work was negative she would like the patient to try the medication that she prescribed. They state they will get that filled. Patient had NCV/EMG study completed last Thursday. Advised once those results come back I will be in touch with those results. She verbalized understanding and had no further questions.

## 2019-07-21 NOTE — Telephone Encounter (Signed)
-----   Message from Larey Seat, MD sent at 07/17/2019 12:18 PM EST ----- I like to do a pyridostigmine challenge - oral - patient has already been told during visit that that would be next step if labs come back negative. I order EMG/NCV with repeat stimulation for myasthenia.

## 2019-07-21 NOTE — Procedures (Signed)
[facility]  NCS (NERVE CONDUCTION STUDY) WITH EMG (ELECTROMYOGRAPHY) REPORT   STUDY DATE: 07/16/19 PATIENT NAME: David Buck DOB: 29-Nov-1940 MRN: [id number]  ORDERING CLINICIAN: Larey Seat, MD   TECHNOLOGIST: Sherre Scarlet ELECTROMYOGRAPHER: Earlean Polka. Deetta Siegmann, MD  CLINICAL INFORMATION: 79 year old male with double vision.  FINDINGS: NERVE CONDUCTION STUDY: Right median and right ulnar motor responses of prolonged distal latencies, normal amplitudes, normal conduction velocities.  Right median and right ulnar sensory responses have prolonged peak latencies and decreased amplitudes.  Right radial sensory response is normal.  Right ulnar F-wave latency is normal.  Repetitive nerve stimulation testing of right spinal accessory nerve with recording over right trapezius muscle was performed at baseline, and following 1 minute of isotonic exertion immediately, and intervals of 1 minute up to 5 minutes.  No decremental response was noted.    NEEDLE ELECTROMYOGRAPHY: Needle examination of right upper extremity was normal except at right first dorsal interosseous with reduced recruitment of large motor units on exertion.   IMPRESSION:   Abnormal study demonstrating: -No electrodiagnostic evidence of neuromuscular junction disorder based on normal repetitive nerve stimulation testing. 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   INTERPRETING PHYSICIAN:  Penni Bombard, MD Certified in Neurology, Neurophysiology and Neuroimaging  [facility] [address], [place], [place] [address] [phone]   [facility]    Nerve / Sites Muscle Latency Ref. Amplitude Ref. Rel Amp Segments Distance Velocity Ref. Area    ms ms mV mV %  cm m/s m/s mVms  R Median - APB     Wrist APB 4.6 ?4.4 5.6 ?4.0 100 Wrist - APB 7   23.6     Upper arm APB 10.2  5.1  92.1 Upper arm - Wrist 28 50 ?49 22.6  R Ulnar - ADM     Wrist ADM 3.8 ?3.3 8.7 ?6.0 100 Wrist - ADM 7   26.5     B.Elbow ADM 8.8  8.1  93.2 B.Elbow - Wrist 27 55 ?49 28.0     A.Elbow ADM 10.8  7.6  94 A.Elbow - B.Elbow 10 51 ?49 27.5         A.Elbow - Wrist             SNC    Nerve / Sites Rec. Site Peak Lat Ref.  Amp Ref. Segments Distance    ms ms V V  cm  R Radial - Anatomical snuff box (Forearm)     Forearm Wrist 2.5 ?2.9 20 ?15 Forearm - Wrist 10  R Median - Orthodromic (Dig II, Mid palm)     Dig II Wrist 3.6 ?3.4 8 ?10 Dig II - Wrist 13  R Ulnar - Orthodromic, (Dig V, Mid palm)     Dig V Wrist 3.4 ?3.1 2 ?5 Dig V - Wrist 22           F  Wave    Nerve F Lat Ref.   ms ms  R Ulnar - ADM 31.9 ?32.0       Rep Stim    Anatomy / Train Rate Ampl. Ampl 4-1 Fac Ampl Area Area 4-1 Fac Area Time   Hz mV % % mVms % %   R Trapezius (upper) - (Accessory spinal)  Baseline @1Hz  1 11.0 97.7 100 83.5 98.1 100 0:00:00  Baseline @3Hz  3 11.0 0.3 100 80.7 5.4 96.7 0:00:31  Post Exercise @0 :00 3 11.1 0.2 101 78.2 4.5 93.6  0:02:09  @ 1:00 3 11.0 1.6 101 83.3 5.9 99.8 0:03:14  @ 2:00 3 11.0 1.4 100 84.0 5.9 101 0:04:14  @ 3:00 3 11.0 1.7 99.9 84.3 6.8 101 0:05:14  @ 5:00 3 11.0 1 100 83.0 6.5 99.4 0:07:18       EMG Summary Table    Spontaneous MUAP Recruitment  Muscle IA Fib PSW Fasc Other Amp Dur. Poly Pattern  R. Deltoid Normal None None None _______ Normal Normal Normal Normal  R. Biceps brachii Normal None None None _______ Normal Normal Normal Normal  R. Flexor carpi radialis Normal None None None _______ Normal Normal Normal Normal  R. First dorsal interosseous Normal None None None _______ Increased Normal Normal Reduced  R. Triceps brachii Normal None None None _______ Normal Normal Normal Normal

## 2019-07-22 ENCOUNTER — Telehealth: Payer: Self-pay | Admitting: Neurology

## 2019-07-22 NOTE — Telephone Encounter (Signed)
Called the patient and reviewed the nerve conduction study. Informed the patient study was pretty normal with exception of the right wrist and elbow area. Advised that typically with this finding we could suggest a right wrist splint at bedtime. The patient states he has no discomfort there and didn't have any complaints on the right wrist. Advised that if he develops any concerns that would be first step in treating. He was appreciative for the advice. Patient schedule for march 30,2021 at 2:30 pm with check in of 2 pm for follow up post studies.

## 2019-07-22 NOTE — Telephone Encounter (Signed)
-----   Message from Larey Seat, MD sent at 07/21/2019  1:14 PM EST ----- Abnormal study demonstrating:  -No electrodiagnostic evidence of neuromuscular junction disorder  based on normal repetitive nerve stimulation testing.  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

## 2019-07-23 LAB — ACHR ABS WITH REFLEX TO MUSK: AChR Binding Ab, Serum: 0.04 nmol/L (ref 0.00–0.24)

## 2019-07-23 LAB — MUSK ANTIBODIES: MuSK Antibodies: <1 U/mL

## 2019-07-27 DIAGNOSIS — E0500 Thyrotoxicosis with diffuse goiter without thyrotoxic crisis or storm: Secondary | ICD-10-CM | POA: Diagnosis not present

## 2019-07-27 DIAGNOSIS — E039 Hypothyroidism, unspecified: Secondary | ICD-10-CM | POA: Diagnosis not present

## 2019-07-28 ENCOUNTER — Encounter: Payer: Self-pay | Admitting: Neurology

## 2019-07-28 NOTE — Telephone Encounter (Signed)
The need to continue medication is not there- it clearly failed to affect you. 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  David Buck Knute Mazzuca

## 2019-08-05 DIAGNOSIS — Z23 Encounter for immunization: Secondary | ICD-10-CM | POA: Diagnosis not present

## 2019-08-07 DIAGNOSIS — H532 Diplopia: Secondary | ICD-10-CM | POA: Diagnosis not present

## 2019-08-07 DIAGNOSIS — E0500 Thyrotoxicosis with diffuse goiter without thyrotoxic crisis or storm: Secondary | ICD-10-CM | POA: Diagnosis not present

## 2019-08-10 ENCOUNTER — Other Ambulatory Visit: Payer: Self-pay

## 2019-08-10 ENCOUNTER — Ambulatory Visit (INDEPENDENT_AMBULATORY_CARE_PROVIDER_SITE_OTHER): Payer: Medicare Other | Admitting: Internal Medicine

## 2019-08-10 ENCOUNTER — Encounter: Payer: Self-pay | Admitting: Internal Medicine

## 2019-08-10 DIAGNOSIS — R911 Solitary pulmonary nodule: Secondary | ICD-10-CM

## 2019-08-10 DIAGNOSIS — J449 Chronic obstructive pulmonary disease, unspecified: Secondary | ICD-10-CM | POA: Diagnosis not present

## 2019-08-10 DIAGNOSIS — F17200 Nicotine dependence, unspecified, uncomplicated: Secondary | ICD-10-CM | POA: Diagnosis not present

## 2019-08-10 NOTE — Progress Notes (Signed)
Subjective:    Patient ID: David Buck, male    DOB: 1940-10-17, 79 y.o.   MRN: [id number]    M 1 PPD smoker followed for small lung nodules/ RUL nodular scarring/ benign bronchoscopy 04/14/08- neg AFB. 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 ------------------------------------------------------------------------------------  08/07/2018- 79 year old male one pack per day smoker followed for small lung nodules/right upper lobe nodular scarring/benign bronchoscopy 91/63/[id number] AFB, complicated by vasomotor rhinitis, -----6 month visit. Patient stated no issues since last office visit. He and his wife have moved to [place].  He goes off campus to smoke still despite advice and this was discussed.  Has not been using [place].  Uses an occasional loratadine.  Cough is stable without discolored sputum, fever, adenopathy or sweats. We reviewed most recent CT. I suggested we defer updating CT and PFT until next return since he is clinically stable. CT chest 08/06/2018- IMPRESSION: 1. Scattered peribronchovascular nodularity, bronchiectasis and mild architectural distortion, similar to 01/31/2018. Findings may be post infectious in etiology or due to mycobacterium avium complex. 2. Aortic atherosclerosis (ICD10-170.0). Coronary artery calcification. 3.  Emphysema (ICD10-J43.9).  08/10/19- 79 year old male smoker (50+ pk yrs) followed for small lung nodules/right upper lobe nodular scarring/benign bronchoscopy 77/93/9030-[id number] AFB, complicated by vasomotor rhinitis, -----f/u lung nodule/COPD. Breathing is at patient's baseline. Loratadine,  Had covid vax x 2     Now about 4-5 cigs/ day Little cough or wheeze, feels breathing stable. Had w/u for dizziness/ balance probs. Turned out to be due to thyroid-related  vision changes.  CXR 05/17/2019- Lungs are hyperexpanded. Small focus of stable scarring in the right upper lobe IMPRESSION: 1. No acute cardiopulmonary disease. 2. Hyperexpanded lungs consistent with COPD  ROS-see HPI  + = positive Constitutional:   No-   weight loss, night sweats, fevers, chills, fatigue, lassitude. HEENT:   No-  headaches, difficulty swallowing, tooth/dental problems, sore throat,       No-  sneezing, itching, ear ache, nasal congestion, +post nasal drip,  CV:  No-   chest pain, orthopnea, PND, swelling in lower extremities, anasarca, dizziness, palpitations Resp: No-   shortness of breath with exertion or at rest.              No-   productive cough,  No- non-productive cough,  No- coughing up of blood.              No-   change in color of mucus.  No- wheezing.   Skin: No-   rash or lesions. GI:  No-   heartburn, indigestion, abdominal pain, nausea, vomiting,  GU:  MS:  No-   joint pain or swelling.  Neuro-     nothing unusual Psych:  No- change in mood or affect. No depression or anxiety.  No memory loss.  Objective:   Physical Exam General- Alert, Oriented, Affect-appropriate, Distress- none acute.+ tall slender man Skin- rash-none, lesions- none, excoriation- none Lymphadenopathy- none Head- atraumatic            Eyes- + patch over R eye.            Ears- Hearing, canals normal            Nose- Clear, No- Septal dev, +mucus postnasal drip, polyps, erosion, perforation             Throat- Mallampati II , mucosa clear , drainage- none, tonsils- atrophic  dentures Neck- flexible , trachea midline, no stridor , thyroid nl, carotid no bruit Chest - symmetrical excursion , unlabored           Heart/CV- RRR , no murmur , no gallop  , no rub, nl s1 s2                           - JVD- none , edema- none, stasis changes- none, varices- none           Lung- Clear, unlabored, wheeze- none, cough- none , dullness-none, rub- none           Chest wall-  Abd-  Br/  Gen/ Rectal- Not done, not indicated Extrem- cyanosis- none, clubbing, none, atrophy- none, strength- nl Neuro-+ mild head bobbing tremor    Assessment & Plan:

## 2019-08-10 NOTE — Patient Instructions (Signed)
I hope things go easier in this next year.  Please call if we can help, and please keep on yourself to reduce your smoking.

## 2019-08-11 ENCOUNTER — Telehealth: Payer: Self-pay | Admitting: Internal Medicine

## 2019-08-11 NOTE — Telephone Encounter (Signed)
Spoke with pt's wife. She wanted to let us know when the pt's Moderna COVID vaccines were received. These dates have been documented in the pt's chart. Nothing further was needed.

## 2019-08-16 NOTE — Assessment & Plan Note (Signed)
CXR 05/17/2019 indicated only stable scarring. We agreed to accept this for now, since symptoms are minimal.

## 2019-08-16 NOTE — Assessment & Plan Note (Signed)
Not currently using inhalers Doesn't note wheeze, cough

## 2019-08-16 NOTE — Assessment & Plan Note (Signed)
Importance of tobacco cessation again emphasized. We had hoped moving to his retirement community would force abstinence, but he smokes outside.

## 2019-08-24 DIAGNOSIS — H442E3 Degenerative myopia with other maculopathy, bilateral eye: Secondary | ICD-10-CM | POA: Diagnosis not present

## 2019-08-24 DIAGNOSIS — H35431 Paving stone degeneration of retina, right eye: Secondary | ICD-10-CM | POA: Diagnosis not present

## 2019-08-24 DIAGNOSIS — H31092 Other chorioretinal scars, left eye: Secondary | ICD-10-CM | POA: Diagnosis not present

## 2019-08-24 DIAGNOSIS — H15833 Staphyloma posticum, bilateral: Secondary | ICD-10-CM | POA: Diagnosis not present

## 2019-08-25 DIAGNOSIS — M6289 Other specified disorders of muscle: Secondary | ICD-10-CM | POA: Diagnosis not present

## 2019-08-25 DIAGNOSIS — R946 Abnormal results of thyroid function studies: Secondary | ICD-10-CM | POA: Diagnosis not present

## 2019-08-25 DIAGNOSIS — E0500 Thyrotoxicosis with diffuse goiter without thyrotoxic crisis or storm: Secondary | ICD-10-CM | POA: Diagnosis not present

## 2019-08-25 DIAGNOSIS — H5021 Vertical strabismus, right eye: Secondary | ICD-10-CM | POA: Diagnosis not present

## 2019-08-25 DIAGNOSIS — Z881 Allergy status to other antibiotic agents status: Secondary | ICD-10-CM | POA: Diagnosis not present

## 2019-08-27 ENCOUNTER — Other Ambulatory Visit: Payer: Self-pay

## 2019-08-27 ENCOUNTER — Ambulatory Visit (INDEPENDENT_AMBULATORY_CARE_PROVIDER_SITE_OTHER): Payer: Medicare Other | Admitting: Internal Medicine

## 2019-08-27 ENCOUNTER — Encounter: Payer: Self-pay | Admitting: Internal Medicine

## 2019-08-27 DIAGNOSIS — E034 Atrophy of thyroid (acquired): Secondary | ICD-10-CM

## 2019-08-27 DIAGNOSIS — H539 Unspecified visual disturbance: Secondary | ICD-10-CM

## 2019-08-27 DIAGNOSIS — E0500 Thyrotoxicosis with diffuse goiter without thyrotoxic crisis or storm: Secondary | ICD-10-CM | POA: Diagnosis not present

## 2019-08-27 DIAGNOSIS — H0589 Other disorders of orbit: Secondary | ICD-10-CM

## 2019-08-27 MED ORDER — LEVOTHYROXINE SODIUM 75 MCG PO TABS: 75.0000 ug | ORAL_TABLET | Freq: Every day | ORAL | 3 refills | Status: DC

## 2019-08-27 MED ORDER — AMLODIPINE BESYLATE 2.5 MG PO TABS: 1.2500 mg | ORAL_TABLET | Freq: Every day | ORAL | 3 refills | Status: DC

## 2019-08-27 NOTE — Progress Notes (Signed)
Subjective:  Patient ID: David Buck, male    DOB: [birth year]  Age: 79 y.o. MRN: [id number]  CC: No chief complaint on file.   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  F/u HTN, hypothyroidism   Outpatient Medications Prior to Visit  Medication Sig Dispense Refill  . amLODipine (NORVASC) 2.5 MG tablet Take 0.5 tablets (1.25 mg total) by mouth daily. 90 tablet 1  . aspirin EC 81 MG tablet Take 1 tablet (81 mg total) by mouth 2 (two) times daily. 100 tablet 3  . Cholecalciferol 2000 units TABS Take 1 tablet by mouth daily.    . Glucosamine HCl 1000 MG TABS Take 1 tablet by mouth daily.    Marland Kitchen levothyroxine (SYNTHROID) 75 MCG tablet     . loratadine (CLARITIN) 10 MG tablet Take 10 mg by mouth daily.     . Multiple Vitamin (MULTIVITAMIN) tablet Take 1 tablet by mouth daily.    . vitamin C (ASCORBIC ACID) 500 MG tablet Take 500 mg by mouth daily.    Marland Kitchen levothyroxine (SYNTHROID) 88 MCG tablet TAKE ONE TABLET BY MOUTH DAILY 90 tablet 3   No facility-administered medications prior to visit.    ROS: Review of Systems  Constitutional: Negative for appetite change, fatigue and unexpected weight change.  HENT: Negative for congestion, nosebleeds, sneezing, sore throat and trouble swallowing.   Eyes: Positive for visual disturbance. Negative for itching.  Respiratory: Negative for cough.   Cardiovascular: Negative for chest pain, palpitations and leg swelling.  Gastrointestinal: Negative for abdominal distention, blood in stool, diarrhea and nausea.  Genitourinary: Negative for frequency and hematuria.  Musculoskeletal: Negative for back pain, gait problem, joint swelling and neck pain.  Skin: Negative for rash.  Neurological: Negative for dizziness, tremors, speech difficulty and weakness.  Psychiatric/Behavioral: Negative  for agitation, dysphoric mood, sleep disturbance and suicidal ideas. The patient is not nervous/anxious.     Objective:  BP 124/72 (BP Location: Right Arm, Patient Position: Sitting, Cuff Size: Large)   Pulse 67   Temp 97.9 F (36.6 C) (Oral)   Ht 6\' 2"  (1.88 m)   Wt 166 lb (75.3 kg)   SpO2 96%   BMI 21.31 kg/m   BP Readings from Last 3 Encounters:  08/27/19 124/72  08/10/19 118/80  07/13/19 (!) 152/92    Wt Readings from Last 3 Encounters:  08/27/19 166 lb (75.3 kg)  08/10/19 166 lb 9.6 oz (75.6 kg)  07/13/19 169 lb (76.7 kg)    Physical Exam Constitutional:      General: He is not in acute distress.    Appearance: He is well-developed.     Comments: NAD  Eyes:     Conjunctiva/sclera: Conjunctivae normal.     Pupils: Pupils are equal, round, and reactive to light.  Neck:     Thyroid: No thyromegaly.     Vascular: No JVD.  Cardiovascular:     Rate and Rhythm: Normal rate and regular rhythm.     Heart sounds: Normal heart sounds. No murmur. No friction rub. No gallop.   Pulmonary:     Effort: Pulmonary effort is normal. No respiratory distress.     Breath sounds: Normal breath sounds. No wheezing or rales.  Chest:     Chest wall: No tenderness.  Abdominal:     General: Bowel sounds are normal. There  is no distension.     Palpations: Abdomen is soft. There is no mass.     Tenderness: There is no abdominal tenderness. There is no guarding or rebound.  Musculoskeletal:        General: No tenderness. Normal range of motion.     Cervical back: Normal range of motion.  Lymphadenopathy:     Cervical: No cervical adenopathy.  Skin:    General: Skin is warm and dry.     Findings: No rash.  Neurological:     Mental Status: He is alert and oriented to person, place, and time.     Cranial Nerves: No cranial nerve deficit.     Motor: No abnormal muscle tone.     Coordination: Coordination normal.     Gait: Gait normal.     Deep Tendon Reflexes: Reflexes are normal and  symmetric.  Psychiatric:        Behavior: Behavior normal.        Thought Content: Thought content normal.        Judgment: Judgment normal.      Lab Results  Component Value Date   WBC 6.7 05/17/2019   HGB 14.3 05/17/2019   HCT 44.9 05/17/2019   PLT 280 05/17/2019   GLUCOSE 100 (H) 05/17/2019   CHOL 114 12/31/2018   TRIG 57.0 12/31/2018   HDL 53.30 12/31/2018   LDLCALC 49 12/31/2018   ALT 17 12/31/2018   AST 18 12/31/2018   NA 137 05/17/2019   K 3.9 05/17/2019   CL 102 05/17/2019   CREATININE 0.97 05/17/2019   BUN 19 05/17/2019   CO2 28 05/17/2019   TSH 0.20 (L) 12/31/2018   PSA 3.23 12/31/2018   INR 1.04 01/06/2016   HGBA1C 5.8 10/26/2010    CT Angio Head W or Wo Contrast  Result Date: 05/17/2019 CLINICAL DATA:  Dizziness. Possible aneurysm. EXAM: CT ANGIOGRAPHY HEAD AND NECK TECHNIQUE: Multidetector CT imaging of the head and neck was performed using the standard protocol during bolus administration of intravenous contrast. Multiplanar CT image reconstructions and MIPs were obtained to evaluate the vascular anatomy. Carotid stenosis measurements (when applicable) are obtained utilizing NASCET criteria, using the distal internal carotid diameter as the denominator. CONTRAST:  167mL OMNIPAQUE IOHEXOL 350 MG/ML SOLN COMPARISON:  MR head 05/06/2019.  MRA head 12/16/2014. FINDINGS: CT HEAD FINDINGS Brain: No evidence of acute infarction, hemorrhage, hydrocephalus, extra-axial collection or mass lesion/mass effect. Vascular: Reported separately. Skull: No fracture or destructive lesion. Mastoids and middle ears are clear. Sinuses: Imaged portions are clear. Orbits: None Review of the MIP images confirms the above findings CTA NECK FINDINGS Aortic arch: Standard branching. Imaged portion shows no evidence of aneurysm or dissection. No significant stenosis of the major arch vessel origins. Heavily calcified plaque at the origin of both subclavians, probable 50% stenosis at the origin  of the RIGHT subclavian. Right carotid system: No evidence of dissection, stenosis (50% or greater) or occlusion. Minor atheromatous change at the bifurcation. Left carotid system: No evidence of dissection, stenosis (50% or greater) or occlusion. Minor atheromatous change the bifurcation. Vertebral arteries: LEFT dominant. No evidence of dissection, stenosis (50% or greater) or occlusion. Skeleton: Spondylosis. Other neck: Noncontributory. Upper chest: No mass or pneumothorax. Review of the MIP images confirms the above findings CTA HEAD FINDINGS Anterior circulation: No significant stenosis, proximal occlusion, aneurysm, or vascular malformation. Posterior circulation: No significant stenosis, proximal occlusion, aneurysm, or vascular malformation. Venous sinuses: As permitted by contrast timing, patent. Anatomic variants: None of significance.  Review of the MIP images confirms the above findings IMPRESSION: 1. No extracranial or intracranial stenosis of significance. 2. No acute intracranial findings. 3. No evidence for intracranial aneurysm or other vascular malformation. Electronically Signed   By: Staci Righter M.D.   On: 05/17/2019 19:05   DG Chest 2 View  Result Date: 05/17/2019 CLINICAL DATA:  Dizziness with weakness and fatigue. EXAM: CHEST - 2 VIEW COMPARISON:  08/30/2017 FINDINGS: Lungs are hyperexpanded. Small focus of stable scarring in the right upper lobe. No evidence of pneumonia or pulmonary edema. No pleural effusion or pneumothorax. Cardiac silhouette is normal in size. No mediastinal or hilar masses. No evidence of adenopathy. Skeletal structures are intact. IMPRESSION: 1. No acute cardiopulmonary disease. 2. Hyperexpanded lungs consistent with COPD. Electronically Signed   By: Lajean Manes M.D.   On: 05/17/2019 18:41   CT Angio Neck W and/or Wo Contrast  Result Date: 05/17/2019 CLINICAL DATA:  Dizziness. Possible aneurysm. EXAM: CT ANGIOGRAPHY HEAD AND NECK TECHNIQUE: Multidetector  CT imaging of the head and neck was performed using the standard protocol during bolus administration of intravenous contrast. Multiplanar CT image reconstructions and MIPs were obtained to evaluate the vascular anatomy. Carotid stenosis measurements (when applicable) are obtained utilizing NASCET criteria, using the distal internal carotid diameter as the denominator. CONTRAST:  135mL OMNIPAQUE IOHEXOL 350 MG/ML SOLN COMPARISON:  MR head 05/06/2019.  MRA head 12/16/2014. FINDINGS: CT HEAD FINDINGS Brain: No evidence of acute infarction, hemorrhage, hydrocephalus, extra-axial collection or mass lesion/mass effect. Vascular: Reported separately. Skull: No fracture or destructive lesion. Mastoids and middle ears are clear. Sinuses: Imaged portions are clear. Orbits: None Review of the MIP images confirms the above findings CTA NECK FINDINGS Aortic arch: Standard branching. Imaged portion shows no evidence of aneurysm or dissection. No significant stenosis of the major arch vessel origins. Heavily calcified plaque at the origin of both subclavians, probable 50% stenosis at the origin of the RIGHT subclavian. Right carotid system: No evidence of dissection, stenosis (50% or greater) or occlusion. Minor atheromatous change at the bifurcation. Left carotid system: No evidence of dissection, stenosis (50% or greater) or occlusion. Minor atheromatous change the bifurcation. Vertebral arteries: LEFT dominant. No evidence of dissection, stenosis (50% or greater) or occlusion. Skeleton: Spondylosis. Other neck: Noncontributory. Upper chest: No mass or pneumothorax. Review of the MIP images confirms the above findings CTA HEAD FINDINGS Anterior circulation: No significant stenosis, proximal occlusion, aneurysm, or vascular malformation. Posterior circulation: No significant stenosis, proximal occlusion, aneurysm, or vascular malformation. Venous sinuses: As permitted by contrast timing, patent. Anatomic variants: None of  significance. Review of the MIP images confirms the above findings IMPRESSION: 1. No extracranial or intracranial stenosis of significance. 2. No acute intracranial findings. 3. No evidence for intracranial aneurysm or other vascular malformation. Electronically Signed   By: Staci Righter M.D.   On: 05/17/2019 19:05    Assessment & Plan:   Walker Kehr, MD

## 2019-08-27 NOTE — Assessment & Plan Note (Signed)
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

## 2019-08-27 NOTE — Patient Instructions (Signed)
Trekking poles

## 2019-09-07 ENCOUNTER — Encounter: Payer: Self-pay | Admitting: Internal Medicine

## 2019-09-22 ENCOUNTER — Ambulatory Visit: Payer: Self-pay | Admitting: Neurology

## 2019-09-28 ENCOUNTER — Ambulatory Visit: Payer: Medicare Other | Admitting: Internal Medicine

## 2019-10-07 DIAGNOSIS — I471 Supraventricular tachycardia: Secondary | ICD-10-CM | POA: Diagnosis not present

## 2019-10-07 DIAGNOSIS — R5383 Other fatigue: Secondary | ICD-10-CM | POA: Diagnosis not present

## 2019-10-08 DIAGNOSIS — H532 Diplopia: Secondary | ICD-10-CM | POA: Diagnosis not present

## 2019-10-21 DIAGNOSIS — E063 Autoimmune thyroiditis: Secondary | ICD-10-CM | POA: Diagnosis not present

## 2019-10-21 DIAGNOSIS — H532 Diplopia: Secondary | ICD-10-CM | POA: Insufficient documentation

## 2019-10-21 DIAGNOSIS — Z87891 Personal history of nicotine dependence: Secondary | ICD-10-CM | POA: Diagnosis not present

## 2019-10-21 DIAGNOSIS — Z716 Tobacco abuse counseling: Secondary | ICD-10-CM | POA: Diagnosis not present

## 2019-10-27 DIAGNOSIS — E0500 Thyrotoxicosis with diffuse goiter without thyrotoxic crisis or storm: Secondary | ICD-10-CM | POA: Diagnosis not present

## 2019-10-27 DIAGNOSIS — E039 Hypothyroidism, unspecified: Secondary | ICD-10-CM | POA: Diagnosis not present

## 2019-11-02 NOTE — Telephone Encounter (Signed)
Received the following message from patient:   "Dr. 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  Dr. Ramonita Lab, [facility], has prescribed treatment with Netty Starring, a relatively new biologic.  Any concerns from your perspective about the proposed course of treatment? Thanks, David Buck"  Dr. Annamaria Boots, please advise. Thank you!

## 2019-11-02 NOTE — Telephone Encounter (Signed)
I looked it up in [facility] and see no pulmonary concern. I hope it helps.

## 2019-11-05 DIAGNOSIS — L821 Other seborrheic keratosis: Secondary | ICD-10-CM | POA: Diagnosis not present

## 2019-11-05 DIAGNOSIS — D1801 Hemangioma of skin and subcutaneous tissue: Secondary | ICD-10-CM | POA: Diagnosis not present

## 2019-11-05 DIAGNOSIS — B353 Tinea pedis: Secondary | ICD-10-CM | POA: Diagnosis not present

## 2019-11-05 DIAGNOSIS — L812 Freckles: Secondary | ICD-10-CM | POA: Diagnosis not present

## 2019-11-05 DIAGNOSIS — L218 Other seborrheic dermatitis: Secondary | ICD-10-CM | POA: Diagnosis not present

## 2019-11-05 DIAGNOSIS — L853 Xerosis cutis: Secondary | ICD-10-CM | POA: Diagnosis not present

## 2019-11-11 DIAGNOSIS — E0500 Thyrotoxicosis with diffuse goiter without thyrotoxic crisis or storm: Secondary | ICD-10-CM | POA: Diagnosis not present

## 2019-11-11 DIAGNOSIS — H532 Diplopia: Secondary | ICD-10-CM | POA: Diagnosis not present

## 2019-11-11 DIAGNOSIS — H47293 Other optic atrophy, bilateral: Secondary | ICD-10-CM | POA: Diagnosis not present

## 2019-11-11 DIAGNOSIS — Q142 Congenital malformation of optic disc: Secondary | ICD-10-CM | POA: Diagnosis not present

## 2019-11-11 DIAGNOSIS — H5340 Unspecified visual field defects: Secondary | ICD-10-CM | POA: Diagnosis not present

## 2019-11-30 DIAGNOSIS — H442E1 Degenerative myopia with other maculopathy, right eye: Secondary | ICD-10-CM | POA: Diagnosis not present

## 2019-11-30 DIAGNOSIS — H31092 Other chorioretinal scars, left eye: Secondary | ICD-10-CM | POA: Diagnosis not present

## 2019-11-30 DIAGNOSIS — H442A2 Degenerative myopia with choroidal neovascularization, left eye: Secondary | ICD-10-CM | POA: Diagnosis not present

## 2019-11-30 DIAGNOSIS — H35373 Puckering of macula, bilateral: Secondary | ICD-10-CM | POA: Diagnosis not present

## 2019-12-17 DIAGNOSIS — E0500 Thyrotoxicosis with diffuse goiter without thyrotoxic crisis or storm: Secondary | ICD-10-CM | POA: Diagnosis not present

## 2019-12-29 ENCOUNTER — Other Ambulatory Visit: Payer: Self-pay

## 2019-12-29 ENCOUNTER — Ambulatory Visit (INDEPENDENT_AMBULATORY_CARE_PROVIDER_SITE_OTHER): Payer: Medicare Other | Admitting: Internal Medicine

## 2019-12-29 ENCOUNTER — Encounter: Payer: Self-pay | Admitting: Internal Medicine

## 2019-12-29 DIAGNOSIS — E0500 Thyrotoxicosis with diffuse goiter without thyrotoxic crisis or storm: Secondary | ICD-10-CM | POA: Diagnosis not present

## 2019-12-29 DIAGNOSIS — R9389 Abnormal findings on diagnostic imaging of other specified body structures: Secondary | ICD-10-CM

## 2019-12-29 DIAGNOSIS — R634 Abnormal weight loss: Secondary | ICD-10-CM

## 2019-12-29 DIAGNOSIS — H539 Unspecified visual disturbance: Secondary | ICD-10-CM

## 2019-12-29 DIAGNOSIS — H05839 Thyroid orbitopathy, unspecified orbit: Secondary | ICD-10-CM

## 2019-12-29 DIAGNOSIS — H0589 Other disorders of orbit: Secondary | ICD-10-CM

## 2019-12-29 LAB — HEPATIC FUNCTION PANEL
ALT: 19 U/L (ref 0–53)
AST: 20 U/L (ref 0–37)
Albumin: 4.8 g/dL (ref 3.5–5.2)
Alkaline Phosphatase: 69 U/L (ref 39–117)
Bilirubin, Direct: 0.1 mg/dL (ref 0.0–0.3)
Total Bilirubin: 0.6 mg/dL (ref 0.2–1.2)
Total Protein: 7.6 g/dL (ref 6.0–8.3)

## 2019-12-29 LAB — BASIC METABOLIC PANEL
BUN: 22 mg/dL (ref 6–23)
CO2: 30 mEq/L (ref 19–32)
Calcium: 10.1 mg/dL (ref 8.4–10.5)
Chloride: 102 mEq/L (ref 96–112)
Creatinine, Ser: 1.02 mg/dL (ref 0.40–1.50)
GFR: 70.65 mL/min (ref >60.00)
Glucose, Bld: 99 mg/dL (ref 70–99)
Potassium: 4.3 mEq/L (ref 3.5–5.1)
Sodium: 139 mEq/L (ref 135–145)

## 2019-12-29 LAB — CBC WITH DIFFERENTIAL/PLATELET
Basophils Absolute: 0.1 10*3/uL (ref 0.0–0.1)
Basophils Relative: 0.9 % (ref 0.0–3.0)
Eosinophils Absolute: 0.4 10*3/uL (ref 0.0–0.7)
Eosinophils Relative: 6.4 % — ABNORMAL HIGH (ref 0.0–5.0)
HCT: 40.8 % (ref 39.0–52.0)
Hemoglobin: 13.8 g/dL (ref 13.0–17.0)
Lymphocytes Relative: 22.5 % (ref 12.0–46.0)
Lymphs Abs: 1.4 10*3/uL (ref 0.7–4.0)
MCHC: 33.7 g/dL (ref 30.0–36.0)
MCV: 92.9 fl (ref 78.0–100.0)
Monocytes Absolute: 0.4 10*3/uL (ref 0.1–1.0)
Monocytes Relative: 6.5 % (ref 3.0–12.0)
Neutro Abs: 3.9 10*3/uL (ref 1.4–7.7)
Neutrophils Relative %: 63.7 % (ref 43.0–77.0)
Platelets: 237 10*3/uL (ref 150.0–400.0)
RBC: 4.39 Mil/uL (ref 4.22–5.81)
RDW: 13 % (ref 11.5–15.5)
WBC: 6.2 10*3/uL (ref 4.0–10.5)

## 2019-12-29 LAB — T3, FREE: T3, Free: 3.4 pg/mL (ref 2.3–4.2)

## 2019-12-29 LAB — TSH: TSH: 0.25 u[IU]/mL — ABNORMAL LOW (ref 0.35–4.50)

## 2019-12-29 LAB — T4, FREE: Free T4: 0.86 ng/dL (ref 0.60–1.60)

## 2019-12-29 NOTE — Assessment & Plan Note (Signed)
Wt Readings from Last 3 Encounters:  12/29/19 165 lb 6.4 oz (75 kg)  08/27/19 166 lb (75.3 kg)  08/10/19 166 lb 9.6 oz (75.6 kg)

## 2019-12-29 NOTE — Assessment & Plan Note (Addendum)
CXR or Chest CT - in 6-8 months

## 2019-12-29 NOTE — Addendum Note (Signed)
Addended by: Cresenciano Lick on: 12/29/2019 03:47 PM   Modules accepted: Orders

## 2019-12-29 NOTE — Progress Notes (Signed)
Subjective:  Patient ID: David Buck, male    DOB: 16-Mar-1941  Age: 79 y.o. MRN: [id number]  CC: Follow-up (4 month follow-up)   HPI David Buck presents for fatigue, feeling cold On 25 mcg Levothyroxine now (down from 88 mcg/d) per Dr Tamala Julian   Per David Buck:  "I have been feeling unwell for the last several weeks.  Symptoms are: General feeling of malaise, increased tremors, occasional hot flashes (1 or 2 daily), cold intolerance, jittery, occasional periods of extreme weakness/fatigue. On June 2 was feeling unwell (similar symptoms to above).  Called Dr. Thompson Caul office (endocrinologist) to schedule a visit.  Spoke with his nurse, described symptoms.  Tamala Julian ordered me to stop levothyroxine for one week and resume at 25 mcg daily. 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 amLODipine (NORVASC) 2.5 MG tablet Take 0.5 tablets (1.25 mg total) by mouth daily. 90 tablet 3  . aspirin EC 81 MG tablet Take 1 tablet (81 mg total) by mouth 2 (two) times daily. 100 tablet 3  . Cholecalciferol 2000 units TABS Take 1 tablet by mouth daily.    . Glucosamine HCl 1000 MG TABS Take 1 tablet by mouth daily.    Marland Kitchen levothyroxine (SYNTHROID) 25 MCG tablet Take 1 by mouth daily in the morning    . loratadine (CLARITIN) 10 MG tablet Take 10 mg by mouth daily.     . Multiple Vitamin (MULTIVITAMIN) tablet Take 1 tablet by mouth daily.    . Saw Palmetto 160 MG CAPS Take 1 by mouth daily    . Teprotumumab-trbw (TEPEZZA) 500 MG SOLR Tepezza 500 mg intravenous solution  Inject 20 mg every 3 weeks by intravenous route for 8 days.    . vitamin C (ASCORBIC ACID) 500 MG tablet Take 500 mg by mouth daily.    . Zinc Sulfate (ZINC-220 PO) Take 1 by mouth daily    . levothyroxine (SYNTHROID) 75 MCG tablet Take 1 tablet (75 mcg total) by mouth daily before breakfast. 90 tablet 3   No facility-administered  medications prior to visit.    ROS: Review of Systems  Constitutional: Positive for fatigue. Negative for appetite change and unexpected weight change.  HENT: Negative for congestion, nosebleeds, sneezing, sore throat and trouble swallowing.   Eyes: Positive for visual disturbance. Negative for itching.  Respiratory: Negative for cough.   Cardiovascular: Negative for chest pain, palpitations and leg swelling.  Gastrointestinal: Negative for abdominal distention, blood in stool, diarrhea and nausea.  Genitourinary: Negative for frequency and hematuria.  Musculoskeletal: Negative for back pain, gait problem, joint swelling and neck pain.  Skin: Negative for rash.  Neurological: Positive for dizziness. Negative for tremors, speech difficulty and weakness.  Psychiatric/Behavioral: Negative for agitation, dysphoric mood and sleep disturbance. The patient is not nervous/anxious.     Objective:  BP 122/84 (BP Location: Right Arm)   Pulse 74   Temp 98 F (36.7 C) (Oral)   Wt 165 lb 6.4 oz (75 kg)   SpO2 96%   BMI 21.24 kg/m   BP Readings from Last 3 Encounters:  12/29/19 122/84  08/27/19 124/72  08/10/19 118/80    Wt Readings from Last 3 Encounters:  12/29/19 165 lb 6.4 oz (75 kg)  08/27/19 166 lb (75.3 kg)  08/10/19 166 lb 9.6 oz (75.6 kg)    Physical Exam Constitutional:      General: He is not  in acute distress.    Appearance: Normal appearance. He is well-developed.     Comments: NAD  Eyes:     Conjunctiva/sclera: Conjunctivae normal.     Pupils: Pupils are equal, round, and reactive to light.  Neck:     Thyroid: No thyromegaly.     Vascular: No JVD.  Cardiovascular:     Rate and Rhythm: Normal rate and regular rhythm.     Heart sounds: Normal heart sounds. No murmur heard.  No friction rub. No gallop.   Pulmonary:     Effort: Pulmonary effort is normal. No respiratory distress.     Breath sounds: Normal breath sounds. No wheezing or rales.  Chest:     Chest  wall: No tenderness.  Abdominal:     General: Bowel sounds are normal. There is no distension.     Palpations: Abdomen is soft. There is no mass.     Tenderness: There is no abdominal tenderness. There is no guarding or rebound.  Musculoskeletal:        General: No tenderness. Normal range of motion.     Cervical back: Normal range of motion.  Lymphadenopathy:     Cervical: No cervical adenopathy.  Skin:    General: Skin is warm and dry.     Findings: No rash.  Neurological:     Mental Status: He is alert and oriented to person, place, and time.     Cranial Nerves: No cranial nerve deficit.     Motor: No abnormal muscle tone.     Coordination: Coordination normal.     Gait: Gait normal.     Deep Tendon Reflexes: Reflexes are normal and symmetric.  Psychiatric:        Behavior: Behavior normal.        Thought Content: Thought content normal.        Judgment: Judgment normal.    R eye is patched   Lab Results  Component Value Date   WBC 6.7 05/17/2019   HGB 14.3 05/17/2019   HCT 44.9 05/17/2019   PLT 280 05/17/2019   GLUCOSE 100 (H) 05/17/2019   CHOL 114 12/31/2018   TRIG 57.0 12/31/2018   HDL 53.30 12/31/2018   LDLCALC 49 12/31/2018   ALT 17 12/31/2018   AST 18 12/31/2018   NA 137 05/17/2019   K 3.9 05/17/2019   CL 102 05/17/2019   CREATININE 0.97 05/17/2019   BUN 19 05/17/2019   CO2 28 05/17/2019   TSH 0.20 (L) 12/31/2018   PSA 3.23 12/31/2018   INR 1.04 01/06/2016   HGBA1C 5.8 10/26/2010    CT Angio Head W or Wo Contrast  Result Date: 05/17/2019 CLINICAL DATA:  Dizziness. Possible aneurysm. EXAM: CT ANGIOGRAPHY HEAD AND NECK TECHNIQUE: Multidetector CT imaging of the head and neck was performed using the standard protocol during bolus administration of intravenous contrast. Multiplanar CT image reconstructions and MIPs were obtained to evaluate the vascular anatomy. Carotid stenosis measurements (when applicable) are obtained utilizing NASCET criteria, using  the distal internal carotid diameter as the denominator. CONTRAST:  139mL OMNIPAQUE IOHEXOL 350 MG/ML SOLN COMPARISON:  MR head 05/06/2019.  MRA head 12/16/2014. FINDINGS: CT HEAD FINDINGS Brain: No evidence of acute infarction, hemorrhage, hydrocephalus, extra-axial collection or mass lesion/mass effect. Vascular: Reported separately. Skull: No fracture or destructive lesion. Mastoids and middle ears are clear. Sinuses: Imaged portions are clear. Orbits: None Review of the MIP images confirms the above findings CTA NECK FINDINGS Aortic arch: Standard branching. Imaged portion shows no  evidence of aneurysm or dissection. No significant stenosis of the major arch vessel origins. Heavily calcified plaque at the origin of both subclavians, probable 50% stenosis at the origin of the RIGHT subclavian. Right carotid system: No evidence of dissection, stenosis (50% or greater) or occlusion. Minor atheromatous change at the bifurcation. Left carotid system: No evidence of dissection, stenosis (50% or greater) or occlusion. Minor atheromatous change the bifurcation. Vertebral arteries: LEFT dominant. No evidence of dissection, stenosis (50% or greater) or occlusion. Skeleton: Spondylosis. Other neck: Noncontributory. Upper chest: No mass or pneumothorax. Review of the MIP images confirms the above findings CTA HEAD FINDINGS Anterior circulation: No significant stenosis, proximal occlusion, aneurysm, or vascular malformation. Posterior circulation: No significant stenosis, proximal occlusion, aneurysm, or vascular malformation. Venous sinuses: As permitted by contrast timing, patent. Anatomic variants: None of significance. Review of the MIP images confirms the above findings IMPRESSION: 1. No extracranial or intracranial stenosis of significance. 2. No acute intracranial findings. 3. No evidence for intracranial aneurysm or other vascular malformation. Electronically Signed   By: Staci Righter M.D.   On: 05/17/2019 19:05    DG Chest 2 View  Result Date: 05/17/2019 CLINICAL DATA:  Dizziness with weakness and fatigue. EXAM: CHEST - 2 VIEW COMPARISON:  08/30/2017 FINDINGS: Lungs are hyperexpanded. Small focus of stable scarring in the right upper lobe. No evidence of pneumonia or pulmonary edema. No pleural effusion or pneumothorax. Cardiac silhouette is normal in size. No mediastinal or hilar masses. No evidence of adenopathy. Skeletal structures are intact. IMPRESSION: 1. No acute cardiopulmonary disease. 2. Hyperexpanded lungs consistent with COPD. Electronically Signed   By: Lajean Manes M.D.   On: 05/17/2019 18:41   CT Angio Neck W and/or Wo Contrast  Result Date: 05/17/2019 CLINICAL DATA:  Dizziness. Possible aneurysm. EXAM: CT ANGIOGRAPHY HEAD AND NECK TECHNIQUE: Multidetector CT imaging of the head and neck was performed using the standard protocol during bolus administration of intravenous contrast. Multiplanar CT image reconstructions and MIPs were obtained to evaluate the vascular anatomy. Carotid stenosis measurements (when applicable) are obtained utilizing NASCET criteria, using the distal internal carotid diameter as the denominator. CONTRAST:  167mL OMNIPAQUE IOHEXOL 350 MG/ML SOLN COMPARISON:  MR head 05/06/2019.  MRA head 12/16/2014. FINDINGS: CT HEAD FINDINGS Brain: No evidence of acute infarction, hemorrhage, hydrocephalus, extra-axial collection or mass lesion/mass effect. Vascular: Reported separately. Skull: No fracture or destructive lesion. Mastoids and middle ears are clear. Sinuses: Imaged portions are clear. Orbits: None Review of the MIP images confirms the above findings CTA NECK FINDINGS Aortic arch: Standard branching. Imaged portion shows no evidence of aneurysm or dissection. No significant stenosis of the major arch vessel origins. Heavily calcified plaque at the origin of both subclavians, probable 50% stenosis at the origin of the RIGHT subclavian. Right carotid system: No evidence of  dissection, stenosis (50% or greater) or occlusion. Minor atheromatous change at the bifurcation. Left carotid system: No evidence of dissection, stenosis (50% or greater) or occlusion. Minor atheromatous change the bifurcation. Vertebral arteries: LEFT dominant. No evidence of dissection, stenosis (50% or greater) or occlusion. Skeleton: Spondylosis. Other neck: Noncontributory. Upper chest: No mass or pneumothorax. Review of the MIP images confirms the above findings CTA HEAD FINDINGS Anterior circulation: No significant stenosis, proximal occlusion, aneurysm, or vascular malformation. Posterior circulation: No significant stenosis, proximal occlusion, aneurysm, or vascular malformation. Venous sinuses: As permitted by contrast timing, patent. Anatomic variants: None of significance. Review of the MIP images confirms the above findings IMPRESSION: 1. No extracranial or intracranial  stenosis of significance. 2. No acute intracranial findings. 3. No evidence for intracranial aneurysm or other vascular malformation. Electronically Signed   By: Staci Righter M.D.   On: 05/17/2019 19:05    Assessment & Plan:    Walker Kehr, MD

## 2019-12-29 NOTE — Assessment & Plan Note (Signed)
On Tepezza infusion Labs

## 2019-12-29 NOTE — Assessment & Plan Note (Signed)
On Tepezza infusion

## 2019-12-30 LAB — URINALYSIS
Bilirubin Urine: NEGATIVE
Hgb urine dipstick: NEGATIVE
Ketones, ur: NEGATIVE
Leukocytes,Ua: NEGATIVE
Nitrite: NEGATIVE
Specific Gravity, Urine: 1.02 (ref 1.000–1.030)
Total Protein, Urine: NEGATIVE
Urine Glucose: NEGATIVE
Urobilinogen, UA: 0.2 (ref 0.0–1.0)
pH: 6 (ref 5.0–8.0)

## 2020-01-07 DIAGNOSIS — E0500 Thyrotoxicosis with diffuse goiter without thyrotoxic crisis or storm: Secondary | ICD-10-CM | POA: Diagnosis not present

## 2020-01-11 DIAGNOSIS — H35373 Puckering of macula, bilateral: Secondary | ICD-10-CM | POA: Diagnosis not present

## 2020-01-11 DIAGNOSIS — H15833 Staphyloma posticum, bilateral: Secondary | ICD-10-CM | POA: Diagnosis not present

## 2020-01-11 DIAGNOSIS — H442E1 Degenerative myopia with other maculopathy, right eye: Secondary | ICD-10-CM | POA: Diagnosis not present

## 2020-01-11 DIAGNOSIS — H442A2 Degenerative myopia with choroidal neovascularization, left eye: Secondary | ICD-10-CM | POA: Diagnosis not present

## 2020-01-26 DIAGNOSIS — E063 Autoimmune thyroiditis: Secondary | ICD-10-CM | POA: Diagnosis not present

## 2020-01-26 DIAGNOSIS — H532 Diplopia: Secondary | ICD-10-CM | POA: Diagnosis not present

## 2020-01-26 DIAGNOSIS — Z716 Tobacco abuse counseling: Secondary | ICD-10-CM | POA: Diagnosis not present

## 2020-01-28 DIAGNOSIS — E0500 Thyrotoxicosis with diffuse goiter without thyrotoxic crisis or storm: Secondary | ICD-10-CM | POA: Diagnosis not present

## 2020-02-09 ENCOUNTER — Encounter: Payer: Self-pay | Admitting: Internal Medicine

## 2020-02-09 ENCOUNTER — Other Ambulatory Visit: Payer: Self-pay

## 2020-02-09 ENCOUNTER — Ambulatory Visit (INDEPENDENT_AMBULATORY_CARE_PROVIDER_SITE_OTHER): Payer: Medicare Other | Admitting: Internal Medicine

## 2020-02-09 VITALS — BP 148/80 | HR 69 | Temp 98.3°F | Ht 74.0 in | Wt 166.0 lb

## 2020-02-09 DIAGNOSIS — G250 Essential tremor: Secondary | ICD-10-CM

## 2020-02-09 DIAGNOSIS — E034 Atrophy of thyroid (acquired): Secondary | ICD-10-CM | POA: Diagnosis not present

## 2020-02-09 DIAGNOSIS — H539 Unspecified visual disturbance: Secondary | ICD-10-CM

## 2020-02-09 DIAGNOSIS — N320 Bladder-neck obstruction: Secondary | ICD-10-CM | POA: Diagnosis not present

## 2020-02-09 DIAGNOSIS — R269 Unspecified abnormalities of gait and mobility: Secondary | ICD-10-CM

## 2020-02-09 MED ORDER — LORAZEPAM 0.5 MG PO TABS
0.5000 mg | ORAL_TABLET | Freq: Two times a day (BID) | ORAL | 1 refills | Status: DC | PRN
Start: 2020-02-09 — End: 2020-06-12

## 2020-02-09 NOTE — Assessment & Plan Note (Signed)
Due to poor vision Ref to PT

## 2020-02-09 NOTE — Assessment & Plan Note (Signed)
Not better on Tapezza F/u w/Dr Toy Cookey

## 2020-02-09 NOTE — Assessment & Plan Note (Signed)
F/u w/Dr Nicholes Stairs - Endo

## 2020-02-09 NOTE — Assessment & Plan Note (Signed)
Worse Will watch

## 2020-02-09 NOTE — Progress Notes (Signed)
Subjective:  Patient ID: David Buck, male    DOB: 30-Jul-1940  Age: 79 y.o. MRN: [id number]  CC: No chief complaint on file.   HPI David Buck presents for HTN, R eye disease, hypothyroidism f/u  R hand is cramping C/o tremor  Outpatient Medications Prior to Visit  Medication Sig Dispense Refill  . amLODipine (NORVASC) 2.5 MG tablet Take 0.5 tablets (1.25 mg total) by mouth daily. 90 tablet 3  . aspirin EC 81 MG tablet Take 1 tablet (81 mg total) by mouth 2 (two) times daily. 100 tablet 3  . Cholecalciferol 2000 units TABS Take 1 tablet by mouth daily.    Marland Kitchen loratadine (CLARITIN) 10 MG tablet Take 10 mg by mouth daily.     . Multiple Vitamin (MULTIVITAMIN) tablet Take 1 tablet by mouth daily.    . Saw Palmetto 160 MG CAPS Take 1 by mouth daily    . Teprotumumab-trbw (TEPEZZA) 500 MG SOLR Tepezza 500 mg intravenous solution  Inject 20 mg every 3 weeks by intravenous route for 8 days.    . vitamin C (ASCORBIC ACID) 500 MG tablet Take 500 mg by mouth daily.    . Zinc Sulfate (ZINC-220 PO) Take 1 by mouth daily    . Glucosamine HCl 1000 MG TABS Take 1 tablet by mouth daily.    Marland Kitchen levothyroxine (SYNTHROID) 25 MCG tablet Take 1 by mouth daily in the morning     No facility-administered medications prior to visit.    ROS: Review of Systems  Constitutional: Negative for appetite change, fatigue and unexpected weight change.  HENT: Negative for congestion, nosebleeds, sneezing, sore throat and trouble swallowing.   Eyes: Negative for itching and visual disturbance.  Respiratory: Negative for cough.   Cardiovascular: Negative for chest pain, palpitations and leg swelling.  Gastrointestinal: Negative for abdominal distention, blood in stool, diarrhea and nausea.  Genitourinary: Negative for frequency and hematuria.  Musculoskeletal: Negative for back pain, gait problem, joint swelling and neck pain.  Skin: Negative for rash.  Neurological: Negative for dizziness, tremors,  speech difficulty and weakness.  Psychiatric/Behavioral: Negative for agitation, dysphoric mood, sleep disturbance and suicidal ideas. The patient is not nervous/anxious.     Objective:  BP (!) 148/80 (BP Location: Left Arm, Patient Position: Sitting, Cuff Size: Large)   Pulse 69   Temp 98.3 F (36.8 C) (Oral)   Ht 6\' 2"  (1.88 m)   Wt 166 lb (75.3 kg)   SpO2 95%   BMI 21.31 kg/m   BP Readings from Last 3 Encounters:  02/09/20 (!) 148/80  12/29/19 122/84  08/27/19 124/72    Wt Readings from Last 3 Encounters:  02/09/20 166 lb (75.3 kg)  12/29/19 165 lb 6.4 oz (75 kg)  08/27/19 166 lb (75.3 kg)    Physical Exam Constitutional:      General: He is not in acute distress.    Appearance: He is well-developed.     Comments: NAD  Eyes:     Conjunctiva/sclera: Conjunctivae normal.     Pupils: Pupils are equal, round, and reactive to light.  Neck:     Thyroid: No thyromegaly.     Vascular: No JVD.  Cardiovascular:     Rate and Rhythm: Normal rate and regular rhythm.     Heart sounds: Normal heart sounds. No murmur heard.  No friction rub. No gallop.   Pulmonary:     Effort: Pulmonary effort is normal. No respiratory distress.     Breath sounds: Normal breath  sounds. No wheezing or rales.  Chest:     Chest wall: No tenderness.  Abdominal:     General: Bowel sounds are normal. There is no distension.     Palpations: Abdomen is soft. There is no mass.     Tenderness: There is no abdominal tenderness. There is no guarding or rebound.  Musculoskeletal:        General: No tenderness. Normal range of motion.     Cervical back: Normal range of motion.  Lymphadenopathy:     Cervical: No cervical adenopathy.  Skin:    General: Skin is warm and dry.     Findings: No rash.  Neurological:     Mental Status: He is alert and oriented to person, place, and time.     Cranial Nerves: No cranial nerve deficit.     Motor: No abnormal muscle tone.     Coordination: Coordination  normal.     Gait: Gait normal.     Deep Tendon Reflexes: Reflexes are normal and symmetric.  Psychiatric:        Behavior: Behavior normal.        Thought Content: Thought content normal.        Judgment: Judgment normal.   R eye is patched  Lab Results  Component Value Date   WBC 6.2 12/29/2019   HGB 13.8 12/29/2019   HCT 40.8 12/29/2019   PLT 237.0 12/29/2019   GLUCOSE 99 12/29/2019   CHOL 114 12/31/2018   TRIG 57.0 12/31/2018   HDL 53.30 12/31/2018   LDLCALC 49 12/31/2018   ALT 19 12/29/2019   AST 20 12/29/2019   NA 139 12/29/2019   K 4.3 12/29/2019   CL 102 12/29/2019   CREATININE 1.02 12/29/2019   BUN 22 12/29/2019   CO2 30 12/29/2019   TSH 0.25 (L) 12/29/2019   PSA 3.23 12/31/2018   INR 1.04 01/06/2016   HGBA1C 5.8 10/26/2010    CT Angio Head W or Wo Contrast  Result Date: 05/17/2019 CLINICAL DATA:  Dizziness. Possible aneurysm. EXAM: CT ANGIOGRAPHY HEAD AND NECK TECHNIQUE: Multidetector CT imaging of the head and neck was performed using the standard protocol during bolus administration of intravenous contrast. Multiplanar CT image reconstructions and MIPs were obtained to evaluate the vascular anatomy. Carotid stenosis measurements (when applicable) are obtained utilizing NASCET criteria, using the distal internal carotid diameter as the denominator. CONTRAST:  136mL OMNIPAQUE IOHEXOL 350 MG/ML SOLN COMPARISON:  MR head 05/06/2019.  MRA head 12/16/2014. FINDINGS: CT HEAD FINDINGS Brain: No evidence of acute infarction, hemorrhage, hydrocephalus, extra-axial collection or mass lesion/mass effect. Vascular: Reported separately. Skull: No fracture or destructive lesion. Mastoids and middle ears are clear. Sinuses: Imaged portions are clear. Orbits: None Review of the MIP images confirms the above findings CTA NECK FINDINGS Aortic arch: Standard branching. Imaged portion shows no evidence of aneurysm or dissection. No significant stenosis of the major arch vessel origins.  Heavily calcified plaque at the origin of both subclavians, probable 50% stenosis at the origin of the RIGHT subclavian. Right carotid system: No evidence of dissection, stenosis (50% or greater) or occlusion. Minor atheromatous change at the bifurcation. Left carotid system: No evidence of dissection, stenosis (50% or greater) or occlusion. Minor atheromatous change the bifurcation. Vertebral arteries: LEFT dominant. No evidence of dissection, stenosis (50% or greater) or occlusion. Skeleton: Spondylosis. Other neck: Noncontributory. Upper chest: No mass or pneumothorax. Review of the MIP images confirms the above findings CTA HEAD FINDINGS Anterior circulation: No significant stenosis, proximal occlusion,  aneurysm, or vascular malformation. Posterior circulation: No significant stenosis, proximal occlusion, aneurysm, or vascular malformation. Venous sinuses: As permitted by contrast timing, patent. Anatomic variants: None of significance. Review of the MIP images confirms the above findings IMPRESSION: 1. No extracranial or intracranial stenosis of significance. 2. No acute intracranial findings. 3. No evidence for intracranial aneurysm or other vascular malformation. Electronically Signed   By: Staci Righter M.D.   On: 05/17/2019 19:05   DG Chest 2 View  Result Date: 05/17/2019 CLINICAL DATA:  Dizziness with weakness and fatigue. EXAM: CHEST - 2 VIEW COMPARISON:  08/30/2017 FINDINGS: Lungs are hyperexpanded. Small focus of stable scarring in the right upper lobe. No evidence of pneumonia or pulmonary edema. No pleural effusion or pneumothorax. Cardiac silhouette is normal in size. No mediastinal or hilar masses. No evidence of adenopathy. Skeletal structures are intact. IMPRESSION: 1. No acute cardiopulmonary disease. 2. Hyperexpanded lungs consistent with COPD. Electronically Signed   By: Lajean Manes M.D.   On: 05/17/2019 18:41   CT Angio Neck W and/or Wo Contrast  Result Date: 05/17/2019 CLINICAL  DATA:  Dizziness. Possible aneurysm. EXAM: CT ANGIOGRAPHY HEAD AND NECK TECHNIQUE: Multidetector CT imaging of the head and neck was performed using the standard protocol during bolus administration of intravenous contrast. Multiplanar CT image reconstructions and MIPs were obtained to evaluate the vascular anatomy. Carotid stenosis measurements (when applicable) are obtained utilizing NASCET criteria, using the distal internal carotid diameter as the denominator. CONTRAST:  180mL OMNIPAQUE IOHEXOL 350 MG/ML SOLN COMPARISON:  MR head 05/06/2019.  MRA head 12/16/2014. FINDINGS: CT HEAD FINDINGS Brain: No evidence of acute infarction, hemorrhage, hydrocephalus, extra-axial collection or mass lesion/mass effect. Vascular: Reported separately. Skull: No fracture or destructive lesion. Mastoids and middle ears are clear. Sinuses: Imaged portions are clear. Orbits: None Review of the MIP images confirms the above findings CTA NECK FINDINGS Aortic arch: Standard branching. Imaged portion shows no evidence of aneurysm or dissection. No significant stenosis of the major arch vessel origins. Heavily calcified plaque at the origin of both subclavians, probable 50% stenosis at the origin of the RIGHT subclavian. Right carotid system: No evidence of dissection, stenosis (50% or greater) or occlusion. Minor atheromatous change at the bifurcation. Left carotid system: No evidence of dissection, stenosis (50% or greater) or occlusion. Minor atheromatous change the bifurcation. Vertebral arteries: LEFT dominant. No evidence of dissection, stenosis (50% or greater) or occlusion. Skeleton: Spondylosis. Other neck: Noncontributory. Upper chest: No mass or pneumothorax. Review of the MIP images confirms the above findings CTA HEAD FINDINGS Anterior circulation: No significant stenosis, proximal occlusion, aneurysm, or vascular malformation. Posterior circulation: No significant stenosis, proximal occlusion, aneurysm, or vascular  malformation. Venous sinuses: As permitted by contrast timing, patent. Anatomic variants: None of significance. Review of the MIP images confirms the above findings IMPRESSION: 1. No extracranial or intracranial stenosis of significance. 2. No acute intracranial findings. 3. No evidence for intracranial aneurysm or other vascular malformation. Electronically Signed   By: Staci Righter M.D.   On: 05/17/2019 19:05    Assessment & Plan:     Follow-up: No follow-ups on file.  Walker Kehr, MD

## 2020-02-10 LAB — COMPLETE METABOLIC PANEL WITH GFR
AG Ratio: 1.8 (calc) (ref 1.0–2.5)
ALT: 21 U/L (ref 9–46)
AST: 18 U/L (ref 10–35)
Albumin: 4.6 g/dL (ref 3.6–5.1)
Alkaline phosphatase (APISO): 54 U/L (ref 35–144)
BUN: 20 mg/dL (ref 7–25)
CO2: 26 mmol/L (ref 20–32)
Calcium: 10 mg/dL (ref 8.6–10.3)
Chloride: 103 mmol/L (ref 98–110)
Creat: 0.89 mg/dL (ref 0.70–1.18)
GFR, Est African American: 95 mL/min/{1.73_m2} (ref >=60)
GFR, Est Non African American: 82 mL/min/{1.73_m2} (ref >=60)
Globulin: 2.5 g/dL (calc) (ref 1.9–3.7)
Glucose, Bld: 98 mg/dL (ref 65–99)
Potassium: 4.2 mmol/L (ref 3.5–5.3)
Sodium: 138 mmol/L (ref 135–146)
Total Bilirubin: 0.7 mg/dL (ref 0.2–1.2)
Total Protein: 7.1 g/dL (ref 6.1–8.1)

## 2020-02-10 LAB — PSA: PSA: 0.6 ng/mL (ref <=4.0)

## 2020-02-11 DIAGNOSIS — E039 Hypothyroidism, unspecified: Secondary | ICD-10-CM | POA: Diagnosis not present

## 2020-02-11 DIAGNOSIS — E0500 Thyrotoxicosis with diffuse goiter without thyrotoxic crisis or storm: Secondary | ICD-10-CM | POA: Diagnosis not present

## 2020-02-12 DIAGNOSIS — Z79899 Other long term (current) drug therapy: Secondary | ICD-10-CM | POA: Diagnosis not present

## 2020-02-12 DIAGNOSIS — H532 Diplopia: Secondary | ICD-10-CM | POA: Diagnosis not present

## 2020-02-12 DIAGNOSIS — E0500 Thyrotoxicosis with diffuse goiter without thyrotoxic crisis or storm: Secondary | ICD-10-CM | POA: Diagnosis not present

## 2020-02-12 DIAGNOSIS — H5021 Vertical strabismus, right eye: Secondary | ICD-10-CM | POA: Diagnosis not present

## 2020-02-12 DIAGNOSIS — Q142 Congenital malformation of optic disc: Secondary | ICD-10-CM | POA: Diagnosis not present

## 2020-02-12 DIAGNOSIS — H442A2 Degenerative myopia with choroidal neovascularization, left eye: Secondary | ICD-10-CM | POA: Diagnosis not present

## 2020-02-16 DIAGNOSIS — E0500 Thyrotoxicosis with diffuse goiter without thyrotoxic crisis or storm: Secondary | ICD-10-CM | POA: Diagnosis not present

## 2020-02-16 DIAGNOSIS — H532 Diplopia: Secondary | ICD-10-CM | POA: Diagnosis not present

## 2020-02-18 DIAGNOSIS — E0500 Thyrotoxicosis with diffuse goiter without thyrotoxic crisis or storm: Secondary | ICD-10-CM | POA: Diagnosis not present

## 2020-02-29 DIAGNOSIS — R2681 Unsteadiness on feet: Secondary | ICD-10-CM | POA: Diagnosis not present

## 2020-02-29 DIAGNOSIS — H059 Unspecified disorder of orbit: Secondary | ICD-10-CM | POA: Diagnosis not present

## 2020-02-29 DIAGNOSIS — H532 Diplopia: Secondary | ICD-10-CM | POA: Diagnosis not present

## 2020-03-03 DIAGNOSIS — H059 Unspecified disorder of orbit: Secondary | ICD-10-CM | POA: Diagnosis not present

## 2020-03-03 DIAGNOSIS — H532 Diplopia: Secondary | ICD-10-CM | POA: Diagnosis not present

## 2020-03-03 DIAGNOSIS — R2681 Unsteadiness on feet: Secondary | ICD-10-CM | POA: Diagnosis not present

## 2020-03-07 DIAGNOSIS — H532 Diplopia: Secondary | ICD-10-CM | POA: Diagnosis not present

## 2020-03-07 DIAGNOSIS — R2681 Unsteadiness on feet: Secondary | ICD-10-CM | POA: Diagnosis not present

## 2020-03-07 DIAGNOSIS — H059 Unspecified disorder of orbit: Secondary | ICD-10-CM | POA: Diagnosis not present

## 2020-03-09 DIAGNOSIS — R2681 Unsteadiness on feet: Secondary | ICD-10-CM | POA: Diagnosis not present

## 2020-03-09 DIAGNOSIS — H059 Unspecified disorder of orbit: Secondary | ICD-10-CM | POA: Diagnosis not present

## 2020-03-09 DIAGNOSIS — H532 Diplopia: Secondary | ICD-10-CM | POA: Diagnosis not present

## 2020-03-10 DIAGNOSIS — E0500 Thyrotoxicosis with diffuse goiter without thyrotoxic crisis or storm: Secondary | ICD-10-CM | POA: Diagnosis not present

## 2020-03-11 ENCOUNTER — Other Ambulatory Visit: Payer: Self-pay

## 2020-03-11 ENCOUNTER — Encounter (HOSPITAL_BASED_OUTPATIENT_CLINIC_OR_DEPARTMENT_OTHER): Payer: Self-pay | Admitting: *Deleted

## 2020-03-11 ENCOUNTER — Emergency Department (HOSPITAL_BASED_OUTPATIENT_CLINIC_OR_DEPARTMENT_OTHER)
Admission: EM | Admit: 2020-03-11 | Discharge: 2020-03-11 | Disposition: A | Discharge status: Home / Self Care | Payer: Medicare Other | Attending: Emergency Medicine | Admitting: Emergency Medicine

## 2020-03-11 DIAGNOSIS — E039 Hypothyroidism, unspecified: Secondary | ICD-10-CM | POA: Diagnosis not present

## 2020-03-11 DIAGNOSIS — Z79899 Other long term (current) drug therapy: Secondary | ICD-10-CM | POA: Diagnosis not present

## 2020-03-11 DIAGNOSIS — F17210 Nicotine dependence, cigarettes, uncomplicated: Secondary | ICD-10-CM | POA: Insufficient documentation

## 2020-03-11 DIAGNOSIS — R030 Elevated blood-pressure reading, without diagnosis of hypertension: Secondary | ICD-10-CM

## 2020-03-11 DIAGNOSIS — J449 Chronic obstructive pulmonary disease, unspecified: Secondary | ICD-10-CM | POA: Diagnosis not present

## 2020-03-11 DIAGNOSIS — I10 Essential (primary) hypertension: Secondary | ICD-10-CM | POA: Diagnosis not present

## 2020-03-11 DIAGNOSIS — Z96652 Presence of left artificial knee joint: Secondary | ICD-10-CM | POA: Insufficient documentation

## 2020-03-11 DIAGNOSIS — I2510 Atherosclerotic heart disease of native coronary artery without angina pectoris: Secondary | ICD-10-CM | POA: Diagnosis not present

## 2020-03-11 DIAGNOSIS — Z79890 Hormone replacement therapy (postmenopausal): Secondary | ICD-10-CM | POA: Diagnosis not present

## 2020-03-11 DIAGNOSIS — Z7982 Long term (current) use of aspirin: Secondary | ICD-10-CM | POA: Diagnosis not present

## 2020-03-11 LAB — COMPREHENSIVE METABOLIC PANEL
ALT: 37 U/L (ref 0–44)
AST: 62 U/L — ABNORMAL HIGH (ref 15–41)
Albumin: 4.3 g/dL (ref 3.5–5.0)
Alkaline Phosphatase: 52 U/L (ref 38–126)
Anion gap: 9 (ref 5–15)
BUN: 20 mg/dL (ref 8–23)
CO2: 28 mmol/L (ref 22–32)
Calcium: 9.7 mg/dL (ref 8.9–10.3)
Chloride: 101 mmol/L (ref 98–111)
Creatinine, Ser: 1.03 mg/dL (ref 0.61–1.24)
GFR calc Af Amer: >60 mL/min (ref >60)
GFR calc non Af Amer: >60 mL/min (ref >60)
Glucose, Bld: 108 mg/dL — ABNORMAL HIGH (ref 70–99)
Potassium: 4.2 mmol/L (ref 3.5–5.1)
Sodium: 138 mmol/L (ref 135–145)
Total Bilirubin: 0.4 mg/dL (ref 0.3–1.2)
Total Protein: 7.4 g/dL (ref 6.5–8.1)

## 2020-03-11 LAB — CBC WITH DIFFERENTIAL/PLATELET
Abs Immature Granulocytes: 0.03 10*3/uL (ref 0.00–0.07)
Basophils Absolute: 0.1 10*3/uL (ref 0.0–0.1)
Basophils Relative: 1 %
Eosinophils Absolute: 0.8 10*3/uL — ABNORMAL HIGH (ref 0.0–0.5)
Eosinophils Relative: 11 %
HCT: 39.1 % (ref 39.0–52.0)
Hemoglobin: 12.7 g/dL — ABNORMAL LOW (ref 13.0–17.0)
Immature Granulocytes: 0 %
Lymphocytes Relative: 19 %
Lymphs Abs: 1.3 10*3/uL (ref 0.7–4.0)
MCH: 31.1 pg (ref 26.0–34.0)
MCHC: 32.5 g/dL (ref 30.0–36.0)
MCV: 95.8 fL (ref 80.0–100.0)
Monocytes Absolute: 0.4 10*3/uL (ref 0.1–1.0)
Monocytes Relative: 6 %
Neutro Abs: 4.3 10*3/uL (ref 1.7–7.7)
Neutrophils Relative %: 63 %
Platelets: 221 10*3/uL (ref 150–400)
RBC: 4.08 MIL/uL — ABNORMAL LOW (ref 4.22–5.81)
RDW: 12.3 % (ref 11.5–15.5)
WBC: 7 10*3/uL (ref 4.0–10.5)
nRBC: 0 % (ref 0.0–0.2)

## 2020-03-11 LAB — TROPONIN I (HIGH SENSITIVITY): Troponin I (High Sensitivity): 7 ng/L (ref <18)

## 2020-03-11 NOTE — ED Provider Notes (Signed)
[place] EMERGENCY DEPARTMENT Provider Note   CSN: [id number] Arrival date & time: 03/11/20  1157     History Chief Complaint  Patient presents with  . Hypertension    David Buck is a 79 y.o. male.  79 year old male presents with elevated blood pressure reading concern. Patient states he had a blood pressure reading at home today of 200/99, took a 2.5mg  Amlodipine and came to the ER for evaluation. Denies chest pain, SHOB, visual disturbance, headache. States the back of his neck is sore, unsure if related to sleep position or from starting physical therapy for gait disturbance. States history of HTN, had an admission to the hospital in 2019 for syncope, his HCTZ was discontinued at that time and he was started on 2.5mg  Amlodipine. Patient noticed swelling at his ankles at that time so he starting only taking 1.25mg  of his Amlodipine and has continued with this half dose ever since. Patient has a history of graves disease which is treated with an infusion that can cause hypertension. Last infusion was yesterday, noticed blood pressure was elevated with systolic 341 last night and took a dose of lorazepam.         Past Medical History:  Diagnosis Date  . Allergy    rhinitis  . COPD (chronic obstructive pulmonary disease) ([place])   . History of hepatitis B    recovered  . Hypertension   . Hypothyroidism   . 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 Meniere's disease   . Osteoarthritis of right knee    Dr Percell Miller  . Pneumonia    hx of 2/17   . Posterior vitreous detachment 2011   R, body- Dr Baird Cancer  . Prostatitis   . Tinnitus     Patient Active Problem List   Diagnosis Date Noted  . Gait disorder 02/09/2020  . 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 Vision changes 04/28/2019  . Coronary atherosclerosis 08/26/2018  . Aortic atherosclerosis ([place]) 08/10/2018  . Tinnitus 01/21/2018  . Syncope 12/02/2017  . Bradycardia   . Sebaceous cyst  03/08/2017  . Preop exam for internal medicine 12/13/2015  . Microhematuria 09/12/2015  . Fatigue 08/01/2015  . UTI (urinary tract infection) 08/01/2015  . Acute upper respiratory infection 08/01/2015  . CAP (community acquired pneumonia) 08/01/2015  . Libido, decreased 08/01/2015  . TIA (transient ischemic attack) 12/03/2014  . Vertigo 10/20/2014  . 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 Elevated PSA 11/30/2013  . Osteoarthritis of [place] joint of thumb 11/13/2013  . OA (osteoarthritis) of knee 11/13/2013  . Thumb pain 11/12/2013  . Knee pain 11/12/2013  . Essential hypertension 10/17/2012  . Well adult exam 11/03/2010  . 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 Lung nodule 01/30/2010  . SORE THROAT 06/24/2009  . Essential tremor 02/07/2009  . ARM PAIN 02/07/2009  . Seasonal and perennial allergic rhinitis 07/13/2008  . ABNORMAL GLUCOSE NEC 07/13/2008  . SINUSITIS, ACUTE 02/03/2008  . TOBACCO USE DISORDER/SMOKER-SMOKING CESSATION DISCUSSED 10/02/2007  . WEIGHT LOSS, ABNORMAL 10/02/2007  . COPD mixed type ([place]) 08/04/2007  . ACQUIRED CYST OF KIDNEY 08/04/2007  . Hypothyroidism 07/24/2007  . RBBB 07/23/2007  . 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 OSTEOARTHRITIS 07/23/2007  . WHEEZING 07/23/2007  . Abnormal chest CT 07/23/2007  . CHEST XRAY, ABNORMAL 07/23/2007    Past Surgical History:  Procedure Laterality Date  . bilateral cataract surgery     . BRONCHOSCOPY  04-14-08   benign  . INGUINAL HERNIA REPAIR  2005   Dr Hassell Done- bilateral   . right knee menisc tear repair    . 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   Procedure: RIGHT TOTAL KNEE ARTHROPLASTY;  Surgeon: Gaynelle Arabian, MD;  Location: [facility];  Service: Orthopedics;  Laterality: Right;       Family History  Problem Relation Age of Onset  . Cancer Mother        ovarian and bowel  . Other Father        cerebral hemorrage  . Hypertension Other     Social History   Tobacco Use  . Smoking status: Current Every Day  Smoker    Packs/day: 1.00    Years: 50.00    Pack years: 50.00    Types: Cigarettes  . Smokeless tobacco: Never Used  . Tobacco comment: 4 -5 per day   Substance Use Topics  . Alcohol use: Yes    Comment: occasional   . Drug use: No    Home Medications Prior to Admission medications   Medication Sig Start Date End Date Taking? Authorizing Provider  amLODipine (NORVASC) 2.5 MG tablet Take 0.5 tablets (1.25 mg total) by mouth daily. 08/27/19   [name], [name], MD  aspirin EC 81 MG tablet Take 1 tablet (81 mg total) by mouth 2 (two) times daily. 04/28/19   [name], [name], MD  Cholecalciferol 2000 units TABS Take 1 tablet by mouth daily.    [provider]  Glucosamine HCl 1000 MG TABS Take 1 tablet by mouth daily.    [provider]  levothyroxine (SYNTHROID) 25 MCG tablet Take 1 by mouth daily in the morning 11/25/19   [provider]  loratadine (CLARITIN) 10 MG tablet Take 10 mg by mouth daily.     [provider]  LORazepam (ATIVAN) 0.5 MG tablet Take 1 tablet (0.5 mg total) by mouth 2 (two) times daily as needed for anxiety. 02/09/20   [name], [name], MD  Multiple Vitamin (MULTIVITAMIN) tablet Take 1 tablet by mouth daily.    [provider]  Saw Palmetto 160 MG CAPS Take 1 by mouth daily    [provider]  Teprotumumab-trbw (TEPEZZA) 500 MG SOLR Tepezza 500 mg intravenous solution  Inject 20 mg every 3 weeks by intravenous route for 8 days.    [provider]  vitamin C (ASCORBIC ACID) 500 MG tablet Take 500 mg by mouth daily.    [provider]  Zinc Sulfate (ZINC-220 PO) Take 1 by mouth daily    [provider]    Allergies    Cefdinir and Cephalosporins  Review of Systems   Review of Systems  Constitutional: Negative for fever.  Eyes: Negative for visual disturbance.  Respiratory: Negative for shortness of breath.   Cardiovascular: Negative for chest pain and leg swelling.    Gastrointestinal: Negative for nausea and vomiting.  Neurological: Negative for headaches.  Psychiatric/Behavioral: Negative for confusion.  All other systems reviewed and are negative.   Physical Exam Updated Vital Signs BP (!) 164/90 (BP Location: Right Arm)   Pulse 66   Temp 98.2 F (36.8 C) (Oral)   Resp 16   Ht 6\' 2"  (1.88 m)   Wt 72.6 kg   SpO2 100%   BMI 20.54 kg/m   Physical Exam Vitals and nursing note reviewed.  Constitutional:      General: He is not in acute distress.    Appearance: He is well-developed. He is not diaphoretic.  HENT:  Head: Normocephalic and atraumatic.  Cardiovascular:     Rate and Rhythm: Normal rate and regular rhythm.     Pulses: Normal pulses.     Heart sounds: Normal heart sounds.  Pulmonary:     Effort: Pulmonary effort is normal.     Breath sounds: Normal breath sounds.  Abdominal:     Palpations: Abdomen is soft.     Tenderness: There is no abdominal tenderness.  Musculoskeletal:     Right lower leg: No edema.     Left lower leg: No edema.  Skin:    General: Skin is warm and dry.     Findings: No erythema or rash.  Neurological:     Mental Status: He is alert and oriented to person, place, and time.  Psychiatric:        Behavior: Behavior normal.     ED Results / Procedures / Treatments   Labs (all labs ordered are listed, but only abnormal results are displayed) Labs Reviewed  CBC WITH DIFFERENTIAL/PLATELET - Abnormal; Notable for the following components:      Result Value   RBC 4.08 (*)    Hemoglobin 12.7 (*)    Eosinophils Absolute 0.8 (*)    All other components within normal limits  COMPREHENSIVE METABOLIC PANEL - Abnormal; Notable for the following components:   Glucose, Bld 108 (*)    AST 62 (*)    All other components within normal limits  TROPONIN I (HIGH SENSITIVITY)    EKG EKG Interpretation  Date/Time:  Friday March 11 2020 12:26:25 EDT Ventricular Rate:  66 PR Interval:  190 QRS  Duration: 88 QT Interval:  424 QTC Calculation: 444 R Axis:   -56 Text Interpretation: Normal sinus rhythm Possible Left atrial enlargement Left axis deviation Cannot rule out Anteroseptal infarct , age undetermined Abnormal ECG Confirmed by Lennice Sites [phone]) on 03/11/2020 1:03:04 PM   Radiology No results found.  Procedures Procedures (including critical care time)  Medications Ordered in ED Medications - No data to display  ED Course  I have reviewed the triage vital signs and the nursing notes.  Pertinent labs & imaging results that were available during my care of the patient were reviewed by me and considered in my medical decision making (see chart for details).  Clinical Course as of Mar 12 1547  Fri Sep 17, 242  [id number] 79 year old male with complaint of elevated blood pressure without complaint otherwise. Exam unremarkable. Labs reviewed, CBC and CMP without evidence of end organ damage. Trop 7 without complaint of chest pain. EKG reviewed with Dr. Ronnald Nian, does not need repeat trop. Can continue with prescribed medications and follow up with PCP. Advised to take an additional (1.25mg ) of his Amlodipine should his BP remain elevated, return to the ER for symptoms as discussed, follow up with PCP for medication management.    [LM]    Clinical Course User Index [LM] Roque Lias   MDM Rules/Calculators/A&P                          Final Clinical Impression(s) / ED Diagnoses Final diagnoses:  Elevated blood pressure reading    Rx / DC Orders ED Discharge Orders    None       Tacy Learn, PA-C 03/11/20 1548    [place], [place], DO 03/12/20 [phone]

## 2020-03-11 NOTE — ED Triage Notes (Signed)
His BP has been elevated for months. His HTN medications have not been changed per his MD. No symptoms.

## 2020-03-14 DIAGNOSIS — R2681 Unsteadiness on feet: Secondary | ICD-10-CM | POA: Diagnosis not present

## 2020-03-14 DIAGNOSIS — H059 Unspecified disorder of orbit: Secondary | ICD-10-CM | POA: Diagnosis not present

## 2020-03-14 DIAGNOSIS — H532 Diplopia: Secondary | ICD-10-CM | POA: Diagnosis not present

## 2020-03-16 DIAGNOSIS — I10 Essential (primary) hypertension: Secondary | ICD-10-CM | POA: Diagnosis not present

## 2020-03-16 DIAGNOSIS — I471 Supraventricular tachycardia: Secondary | ICD-10-CM | POA: Diagnosis not present

## 2020-03-16 DIAGNOSIS — E039 Hypothyroidism, unspecified: Secondary | ICD-10-CM | POA: Diagnosis not present

## 2020-03-21 DIAGNOSIS — H442A2 Degenerative myopia with choroidal neovascularization, left eye: Secondary | ICD-10-CM | POA: Diagnosis not present

## 2020-03-21 DIAGNOSIS — H442E1 Degenerative myopia with other maculopathy, right eye: Secondary | ICD-10-CM | POA: Diagnosis not present

## 2020-03-22 ENCOUNTER — Encounter: Payer: Self-pay | Admitting: Internal Medicine

## 2020-03-22 ENCOUNTER — Other Ambulatory Visit: Payer: Self-pay

## 2020-03-22 ENCOUNTER — Ambulatory Visit (INDEPENDENT_AMBULATORY_CARE_PROVIDER_SITE_OTHER): Payer: Medicare Other | Admitting: Internal Medicine

## 2020-03-22 DIAGNOSIS — F341 Dysthymic disorder: Secondary | ICD-10-CM | POA: Diagnosis not present

## 2020-03-22 DIAGNOSIS — E034 Atrophy of thyroid (acquired): Secondary | ICD-10-CM | POA: Diagnosis not present

## 2020-03-22 DIAGNOSIS — F32A Depression, unspecified: Secondary | ICD-10-CM | POA: Insufficient documentation

## 2020-03-22 MED ORDER — OLMESARTAN MEDOXOMIL 20 MG PO TABS: 20.0000 mg | ORAL_TABLET | Freq: Every day | ORAL | 11 refills | Status: DC

## 2020-03-22 MED ORDER — FLUOXETINE HCL 10 MG PO TABS: 10.0000 mg | ORAL_TABLET | Freq: Every day | ORAL | 6 refills | Status: DC

## 2020-03-22 MED ORDER — TRIAMCINOLONE ACETONIDE 0.5 % EX OINT: 1.0000 "application " | TOPICAL_OINTMENT | Freq: Three times a day (TID) | CUTANEOUS | 1 refills | Status: DC

## 2020-03-22 NOTE — Assessment & Plan Note (Signed)
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

## 2020-03-22 NOTE — Assessment & Plan Note (Signed)
Situational Try Fluoxetine 10 mg/d

## 2020-03-22 NOTE — Progress Notes (Signed)
Subjective:  Patient ID: David Buck, male    DOB: 11-Apr-1941  Age: 79 y.o. MRN: [id number]  CC: No chief complaint on file.   HPI David Buck presents for HTN - wet to ER for SBP 42:   79 year old male with complaint of elevated blood pressure without complaint otherwise. Exam unremarkable. Labs reviewed, CBC and CMP without evidence of end organ damage. Trop 7 without complaint of chest pain. EKG reviewed with Dr. Ronnald Nian, does not need repeat trop. Can continue with prescribed medications and follow up with PCP. Advised to take an additional (1.25mg ) of his Amlodipine should his BP remain elevated, return to the ER for symptoms as discussed, follow up with PCP for medication management.    [LM]   SBP 140-170  F/u doublevision There is a concern of depressed mood, situational depression   Outpatient Medications Prior to Visit  Medication Sig Dispense Refill  . amLODipine (NORVASC) 5 MG tablet Take 5 mg by mouth daily.    Marland Kitchen aspirin EC 81 MG tablet Take 1 tablet (81 mg total) by mouth 2 (two) times daily. 100 tablet 3  . Cholecalciferol 2000 units TABS Take 1 tablet by mouth daily.    Marland Kitchen loratadine (CLARITIN) 10 MG tablet Take 10 mg by mouth daily.     Marland Kitchen LORazepam (ATIVAN) 0.5 MG tablet Take 1 tablet (0.5 mg total) by mouth 2 (two) times daily as needed for anxiety. 60 tablet 1  . Multiple Vitamin (MULTIVITAMIN) tablet Take 1 tablet by mouth daily.    . Ranibizumab (LUCENTIS IZ) by Intravitreal route.    . Saw Palmetto 160 MG CAPS Take 1 by mouth daily    . Teprotumumab-trbw (TEPEZZA) 500 MG SOLR Tepezza 500 mg intravenous solution  Inject 20 mg every 3 weeks by intravenous route for 8 days.    . vitamin C (ASCORBIC ACID) 500 MG tablet Take 500 mg by mouth daily.    . Zinc Sulfate (ZINC-220 PO) Take 1 by mouth daily    . amLODipine (NORVASC) 2.5 MG tablet Take 0.5 tablets (1.25 mg total) by mouth daily. 90 tablet 3  . Glucosamine HCl 1000 MG TABS Take 1 tablet by mouth  daily.    Marland Kitchen levothyroxine (SYNTHROID) 25 MCG tablet Take 1 by mouth daily in the morning     No facility-administered medications prior to visit.    ROS: Review of Systems  Constitutional: Negative for appetite change, fatigue and unexpected weight change.  HENT: Negative for congestion, nosebleeds, sneezing, sore throat and trouble swallowing.   Eyes: Positive for visual disturbance. Negative for itching.  Respiratory: Negative for cough.   Cardiovascular: Negative for chest pain, palpitations and leg swelling.  Gastrointestinal: Negative for abdominal distention, blood in stool, diarrhea and nausea.  Genitourinary: Negative for frequency and hematuria.  Musculoskeletal: Negative for back pain, gait problem, joint swelling and neck pain.  Skin: Negative for rash.  Neurological: Negative for dizziness, tremors, speech difficulty and weakness.  Psychiatric/Behavioral: Negative for agitation, dysphoric mood and sleep disturbance. The patient is not nervous/anxious.   doublevision   Objective:  BP 124/80 (BP Location: Right Arm, Patient Position: Sitting, Cuff Size: Large)   Pulse 70   Temp 98.3 F (36.8 C) (Oral)   Ht 6\' 2"  (1.88 m)   Wt 164 lb (74.4 kg)   SpO2 97%   BMI 21.06 kg/m   BP Readings from Last 3 Encounters:  03/22/20 124/80  03/11/20 (!) 164/90  02/09/20 (!) 148/80  Wt Readings from Last 3 Encounters:  03/22/20 164 lb (74.4 kg)  03/11/20 160 lb (72.6 kg)  02/09/20 166 lb (75.3 kg)    Physical Exam Constitutional:      General: He is not in acute distress.    Appearance: He is well-developed.     Comments: NAD  Eyes:     Conjunctiva/sclera: Conjunctivae normal.     Pupils: Pupils are equal, round, and reactive to light.  Neck:     Thyroid: No thyromegaly.     Vascular: No JVD.  Cardiovascular:     Rate and Rhythm: Normal rate and regular rhythm.     Heart sounds: Normal heart sounds. No murmur heard.  No friction rub. No gallop.   Pulmonary:      Effort: Pulmonary effort is normal. No respiratory distress.     Breath sounds: Normal breath sounds. No wheezing or rales.  Chest:     Chest wall: No tenderness.  Abdominal:     General: Bowel sounds are normal. There is no distension.     Palpations: Abdomen is soft. There is no mass.     Tenderness: There is no abdominal tenderness. There is no guarding or rebound.  Musculoskeletal:        General: No tenderness. Normal range of motion.     Cervical back: Normal range of motion.  Lymphadenopathy:     Cervical: No cervical adenopathy.  Skin:    General: Skin is warm and dry.     Findings: No rash.  Neurological:     Mental Status: He is alert and oriented to person, place, and time.     Cranial Nerves: No cranial nerve deficit.     Motor: No abnormal muscle tone.     Coordination: Coordination normal.     Gait: Gait normal.     Deep Tendon Reflexes: Reflexes are normal and symmetric.  Psychiatric:        Behavior: Behavior normal.        Thought Content: Thought content normal.        Judgment: Judgment normal.     Lab Results  Component Value Date   WBC 7.0 03/11/2020   HGB 12.7 (L) 03/11/2020   HCT 39.1 03/11/2020   PLT 221 03/11/2020   GLUCOSE 108 (H) 03/11/2020   CHOL 114 12/31/2018   TRIG 57.0 12/31/2018   HDL 53.30 12/31/2018   LDLCALC 49 12/31/2018   ALT 37 03/11/2020   AST 62 (H) 03/11/2020   NA 138 03/11/2020   K 4.2 03/11/2020   CL 101 03/11/2020   CREATININE 1.03 03/11/2020   BUN 20 03/11/2020   CO2 28 03/11/2020   TSH 0.25 (L) 12/29/2019   PSA 0.6 02/09/2020   INR 1.04 01/06/2016   HGBA1C 5.8 10/26/2010    No results found.  Assessment & Plan:    Walker Kehr, MD

## 2020-03-22 NOTE — Patient Instructions (Signed)
You can try [place]'s [place]ane Mushroom extract or capsules for memory and immune system

## 2020-03-23 ENCOUNTER — Emergency Department (HOSPITAL_BASED_OUTPATIENT_CLINIC_OR_DEPARTMENT_OTHER): Payer: Medicare Other

## 2020-03-23 ENCOUNTER — Encounter (HOSPITAL_BASED_OUTPATIENT_CLINIC_OR_DEPARTMENT_OTHER): Payer: Self-pay | Admitting: *Deleted

## 2020-03-23 ENCOUNTER — Emergency Department (HOSPITAL_BASED_OUTPATIENT_CLINIC_OR_DEPARTMENT_OTHER)
Admission: EM | Admit: 2020-03-23 | Discharge: 2020-03-24 | Disposition: A | Discharge status: Home / Self Care | Payer: Medicare Other | Attending: Emergency Medicine | Admitting: Emergency Medicine

## 2020-03-23 ENCOUNTER — Other Ambulatory Visit: Payer: Self-pay

## 2020-03-23 DIAGNOSIS — Z79899 Other long term (current) drug therapy: Secondary | ICD-10-CM | POA: Insufficient documentation

## 2020-03-23 DIAGNOSIS — E039 Hypothyroidism, unspecified: Secondary | ICD-10-CM | POA: Diagnosis not present

## 2020-03-23 DIAGNOSIS — J449 Chronic obstructive pulmonary disease, unspecified: Secondary | ICD-10-CM | POA: Diagnosis not present

## 2020-03-23 DIAGNOSIS — I10 Essential (primary) hypertension: Secondary | ICD-10-CM | POA: Diagnosis not present

## 2020-03-23 DIAGNOSIS — J439 Emphysema, unspecified: Secondary | ICD-10-CM | POA: Diagnosis not present

## 2020-03-23 DIAGNOSIS — F17210 Nicotine dependence, cigarettes, uncomplicated: Secondary | ICD-10-CM | POA: Diagnosis not present

## 2020-03-23 DIAGNOSIS — R042 Hemoptysis: Secondary | ICD-10-CM

## 2020-03-23 DIAGNOSIS — I700 Atherosclerosis of aorta: Secondary | ICD-10-CM | POA: Diagnosis not present

## 2020-03-23 DIAGNOSIS — J984 Other disorders of lung: Secondary | ICD-10-CM | POA: Diagnosis not present

## 2020-03-23 DIAGNOSIS — I2510 Atherosclerotic heart disease of native coronary artery without angina pectoris: Secondary | ICD-10-CM | POA: Diagnosis not present

## 2020-03-23 DIAGNOSIS — R918 Other nonspecific abnormal finding of lung field: Secondary | ICD-10-CM | POA: Diagnosis not present

## 2020-03-23 MED ORDER — IOHEXOL 350 MG/ML SOLN
100.0000 mL | Freq: Once | INTRAVENOUS | Status: AC | PRN
  Administered 2020-03-24: 100 mL via INTRAVENOUS

## 2020-03-23 NOTE — ED Provider Notes (Signed)
[facility] Provider Note: David Spurling, MD, FACEP  CSN: [id number] MRN: [id number] ARRIVAL: 03/23/20 at [place]: [place]  Hemoptysis   HISTORY OF PRESENT ILLNESS  03/23/20 11:49 PM David Buck is a 79 y.o. male with a history of COPD and eye disease for which he gets injections and monoclonal antibodies.  He is here with hemoptysis that occurred earlier today.  His first episode occurred when he felt like he "had to cough up some phlegm.  He describes it as producing about 1.5 tablespoons of bright red blood.  About a minute later he coughed up less than half that amount.  He subsequently coughed up a dark clot.  He has not had any hemoptysis since.  He has had no associated chest pain or shortness of breath.  He has had no fever, chills or other URI symptoms.  He noticed this evening that his left eye is red.  His pulmonologist is [employer]  He has a remote history of bronchoscopy.     Past Medical History:  Diagnosis Date  . Allergy    rhinitis  . COPD (chronic obstructive pulmonary disease) ([place])   . History of hepatitis B    recovered  . Hypertension   . Hypothyroidism   . 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 Meniere's disease   . Osteoarthritis of right knee    Dr Percell Miller  . Pneumonia    hx of 2/17   . Posterior vitreous detachment 2011   R, body- Dr Baird Cancer  . Prostatitis   . Tinnitus     Past Surgical History:  Procedure Laterality Date  . bilateral cataract surgery     . BRONCHOSCOPY  04-14-08   benign  . INGUINAL HERNIA REPAIR  2005   Dr Hassell Done- bilateral   . right knee menisc tear repair    . 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   Procedure: RIGHT TOTAL KNEE ARTHROPLASTY;  Surgeon: Gaynelle Arabian, MD;  Location: [facility];  Service: Orthopedics;  Laterality: Right;    Family History  Problem Relation Age of Onset  . Cancer Mother        ovarian and bowel  . Other Father        cerebral  hemorrage  . Hypertension Other     Social History   Tobacco Use  . Smoking status: Current Every Day Smoker    Packs/day: 1.00    Years: 50.00    Pack years: 50.00    Types: Cigarettes  . Smokeless tobacco: Never Used  . Tobacco comment: 4 -5 per day   Substance Use Topics  . Alcohol use: Yes    Comment: occasional   . Drug use: No    Prior to Admission medications   Medication Sig Start Date End Date Taking? Authorizing Provider  amLODipine (NORVASC) 2.5 MG tablet Take 0.5 tablets (1.25 mg total) by mouth daily. 08/27/19   [name], [name], MD  amLODipine (NORVASC) 5 MG tablet Take 5 mg by mouth daily.    [provider]  aspirin EC 81 MG tablet Take 1 tablet (81 mg total) by mouth 2 (two) times daily. 04/28/19   [name], [name], MD  Cholecalciferol 2000 units TABS Take 1 tablet by mouth daily.    [provider]  FLUoxetine (PROZAC) 10 MG tablet Take 1 tablet (10 mg total) by mouth daily. 03/22/20 06/20/20  [name], [name], MD  Glucosamine HCl 1000  MG TABS Take 1 tablet by mouth daily.    [provider]  levothyroxine (SYNTHROID) 25 MCG tablet Take 1 by mouth daily in the morning 11/25/19   [provider]  loratadine (CLARITIN) 10 MG tablet Take 10 mg by mouth daily.     [provider]  LORazepam (ATIVAN) 0.5 MG tablet Take 1 tablet (0.5 mg total) by mouth 2 (two) times daily as needed for anxiety. 02/09/20   [name], [name], MD  Multiple Vitamin (MULTIVITAMIN) tablet Take 1 tablet by mouth daily.    [provider]  olmesartan (BENICAR) 20 MG tablet Take 1 tablet (20 mg total) by mouth daily. 03/22/20 03/22/21  [name], [name], MD  Ranibizumab (LUCENTIS IZ) by Intravitreal route.    [provider]  Saw Palmetto 160 MG CAPS Take 1 by mouth daily    [provider]  Teprotumumab-trbw (TEPEZZA) 500 MG SOLR Tepezza 500 mg intravenous solution  Inject 20 mg every 3 weeks by intravenous route  for 8 days.    [provider]  triamcinolone ointment (KENALOG) 0.5 % Apply 1 application topically 3 (three) times daily. 03/22/20 03/22/21  [name], [name], MD  vitamin C (ASCORBIC ACID) 500 MG tablet Take 500 mg by mouth daily.    [provider]  Zinc Sulfate (ZINC-220 PO) Take 1 by mouth daily    [provider]    Allergies Cefdinir and Cephalosporins   REVIEW OF SYSTEMS  Negative except as noted here or in the History of Present Illness.   PHYSICAL EXAMINATION  Initial Vital Signs Blood pressure (!) 156/103, pulse 75, temperature 97.8 F (36.6 C), temperature source Oral, resp. rate 18, height 6\' 2"  (1.88 m), weight 72.6 kg, SpO2 98 %.  Examination General: Well-developed, well-nourished male in no acute distress; appearance consistent with age of record HENT: normocephalic; atraumatic Eyes: pupils equal, round and reactive to light; extraocular muscles grossly intact intact; right eye patched due to diplopia; bilateral pseudophakia; left medial and inferior subconjunctival hemorrhage Neck: supple Heart: regular rate and rhythm Lungs: clear to auscultation bilaterally Abdomen: soft; nondistended; nontender; bowel sounds present Extremities: No deformity; full range of motion; pulses normal Neurologic: Awake, alert and oriented; motor function intact in all extremities and symmetric; no facial droop Skin: Warm and dry Psychiatric: Normal mood and affect   RESULTS  Summary of this visit's results, reviewed and interpreted by myself:   EKG Interpretation  Date/Time:    Ventricular Rate:    PR Interval:    QRS Duration:   QT Interval:    QTC Calculation:   R Axis:     Text Interpretation:        Laboratory Studies: Results for orders placed or performed during the hospital encounter of 03/23/20 (from the past 24 hour(s))  CBC with Differential/Platelet     Status: Abnormal   Collection Time: 03/23/20 11:57 PM  Result Value Ref  Range   WBC 8.9 4.0 - 10.5 K/uL   RBC 4.05 (L) 4.22 - 5.81 MIL/uL   Hemoglobin 12.8 (L) 13.0 - 17.0 g/dL   HCT 38.9 (L) 39 - 52 %   MCV 96.0 80.0 - 100.0 fL   MCH 31.6 26.0 - 34.0 pg   MCHC 32.9 30.0 - 36.0 g/dL   RDW 12.3 11.5 - 15.5 %   Platelets 237 150 - 400 K/uL   nRBC 0.0 0.0 - 0.2 %   Neutrophils Relative % 65 %   Neutro Abs 5.9 1.7 - 7.7  K/uL   Lymphocytes Relative 20 %   Lymphs Abs 1.7 0.7 - 4.0 K/uL   Monocytes Relative 6 %   Monocytes Absolute 0.5 0 - 1 K/uL   Eosinophils Relative 8 %   Eosinophils Absolute 0.7 (H) 0 - 0 K/uL   Basophils Relative 1 %   Basophils Absolute 0.1 0 - 0 K/uL   Immature Granulocytes 0 %   Abs Immature Granulocytes 0.03 0.00 - 0.07 K/uL  Basic metabolic panel     Status: Abnormal   Collection Time: 03/23/20 11:57 PM  Result Value Ref Range   Sodium 137 135 - 145 mmol/L   Potassium 3.9 3.5 - 5.1 mmol/L   Chloride 100 98 - 111 mmol/L   CO2 28 22 - 32 mmol/L   Glucose, Bld 106 (H) 70 - 99 mg/dL   BUN 16 8 - 23 mg/dL   Creatinine, Ser 0.82 0.61 - 1.24 mg/dL   Calcium 9.7 8.9 - 10.3 mg/dL   GFR calc non Af Amer >60 >60 mL/min   GFR calc Af Amer >60 >60 mL/min   Anion gap 9 5 - 15   Imaging Studies: DG Chest 2 View  Result Date: 03/23/2020 CLINICAL DATA:  Hemoptysis. EXAM: CHEST - 2 VIEW COMPARISON:  May 17, 2019 FINDINGS: The lungs are hyperinflated. Mild, diffuse chronic appearing increased lung markings are seen. There is no evidence of acute infiltrate, pleural effusion or pneumothorax. The heart size and mediastinal contours are within normal limits. The visualized skeletal structures are unremarkable. IMPRESSION: 1. Findings consistent with COPD. 2. No acute or active cardiopulmonary disease. Electronically Signed   By: Virgina Norfolk M.D.   On: 03/23/2020 19:58   CT Angio Chest PE W and/or Wo Contrast  Result Date: 03/24/2020 CLINICAL DATA:  Bloody sputum today. Pulmonary embolus suspected with high probability. EXAM: CT  ANGIOGRAPHY CHEST WITH CONTRAST TECHNIQUE: Multidetector CT imaging of the chest was performed using the standard protocol during bolus administration of intravenous contrast. Multiplanar CT image reconstructions and MIPs were obtained to evaluate the vascular anatomy. CONTRAST:  144mL OMNIPAQUE IOHEXOL 350 MG/ML SOLN COMPARISON:  08/06/2018 FINDINGS: Cardiovascular: Good opacification of the central and segmental pulmonary arteries. No focal filling defects. No evidence of significant pulmonary embolus. Mediastinum/Nodes: Normal heart size. No pericardial effusions. Normal caliber thoracic aorta. No evidence of aortic dissection. Great vessel origins are patent. Calcification in the aorta and coronary arteries. Lungs/Pleura: Emphysematous changes in the lungs. Patchy ground-glass nodules, bronchial wall thickening, and peribronchial nodular infiltrates demonstrated in the right upper and right middle lung and minimally in both lung bases. Changes may represent early multifocal pneumonia, bronchopneumonia, or atypical pneumonia. Some similar changes were seen on the previous study but there is progression today. No focal consolidation or volume loss. No pleural effusions. No pneumothorax. Upper Abdomen: No acute changes demonstrated in the visualized upper abdomen. Left renal cysts. Musculoskeletal: No chest wall abnormality. No acute or significant osseous findings. Review of the MIP images confirms the above findings. IMPRESSION: 1. No evidence of significant pulmonary embolus. 2. Patchy ground-glass nodules, bronchial wall thickening, and peribronchial nodular infiltrates in the right upper and right middle lung and minimally in both lung bases. Changes may represent early multifocal pneumonia, bronchopneumonia, or atypical pneumonia. Some similar changes were seen on the previous study but there is progression today. 3. Emphysema and aortic atherosclerosis. Aortic Atherosclerosis (ICD10-I70.0) and Emphysema  (ICD10-J43.9). Electronically Signed   By: Lucienne Capers M.D.   On: 03/24/2020 00:51    ED  COURSE and MDM  Nursing notes, initial and subsequent vitals signs, including pulse oximetry, reviewed and interpreted by myself.  Vitals:   03/23/20 1850 03/23/20 1852 03/23/20 2358  BP:  (!) 156/103 (!) 176/102  Pulse:  75 62  Resp:  18 18  Temp:  97.8 F (36.6 C) 98.2 F (36.8 C)  TempSrc:  Oral Oral  SpO2:  98% 100%  Weight: 72.6 kg    Height: 6\' 2"  (1.88 m)     Medications  doxycycline (VIBRA-TABS) tablet 100 mg (has no administration in time range)  iohexol (OMNIPAQUE) 350 MG/ML injection 100 mL (100 mLs Intravenous Contrast Given 03/24/20 0038)   1:31 AM Patient advised of CT findings.  I do not believe he needs to be admitted at this time as his hemoptysis not severe and is having no shortness of breath.  We will start him on doxycycline for possible infectious etiology.  We will have him contact his pulmonologist, Dr. Baird Lyons, later today for further evaluation.  He was advised to return should symptoms worsen.   PROCEDURES  Procedures   ED DIAGNOSES     ICD-10-CM   1. Hemoptysis  R04.2   2. 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

## 2020-03-23 NOTE — ED Triage Notes (Signed)
C/o bloody sputum x 2 episodes today , denies any other complaints

## 2020-03-24 ENCOUNTER — Telehealth: Payer: Self-pay | Admitting: Internal Medicine

## 2020-03-24 ENCOUNTER — Telehealth: Payer: Self-pay | Admitting: Emergency Medicine

## 2020-03-24 DIAGNOSIS — J439 Emphysema, unspecified: Secondary | ICD-10-CM | POA: Diagnosis not present

## 2020-03-24 DIAGNOSIS — I2510 Atherosclerotic heart disease of native coronary artery without angina pectoris: Secondary | ICD-10-CM | POA: Diagnosis not present

## 2020-03-24 DIAGNOSIS — R918 Other nonspecific abnormal finding of lung field: Secondary | ICD-10-CM | POA: Diagnosis not present

## 2020-03-24 DIAGNOSIS — R042 Hemoptysis: Secondary | ICD-10-CM | POA: Diagnosis not present

## 2020-03-24 DIAGNOSIS — I700 Atherosclerosis of aorta: Secondary | ICD-10-CM | POA: Diagnosis not present

## 2020-03-24 LAB — CBC WITH DIFFERENTIAL/PLATELET
Abs Immature Granulocytes: 0.03 10*3/uL (ref 0.00–0.07)
Basophils Absolute: 0.1 10*3/uL (ref 0.0–0.1)
Basophils Relative: 1 %
Eosinophils Absolute: 0.7 10*3/uL — ABNORMAL HIGH (ref 0.0–0.5)
Eosinophils Relative: 8 %
HCT: 38.9 % — ABNORMAL LOW (ref 39.0–52.0)
Hemoglobin: 12.8 g/dL — ABNORMAL LOW (ref 13.0–17.0)
Immature Granulocytes: 0 %
Lymphocytes Relative: 20 %
Lymphs Abs: 1.7 10*3/uL (ref 0.7–4.0)
MCH: 31.6 pg (ref 26.0–34.0)
MCHC: 32.9 g/dL (ref 30.0–36.0)
MCV: 96 fL (ref 80.0–100.0)
Monocytes Absolute: 0.5 10*3/uL (ref 0.1–1.0)
Monocytes Relative: 6 %
Neutro Abs: 5.9 10*3/uL (ref 1.7–7.7)
Neutrophils Relative %: 65 %
Platelets: 237 10*3/uL (ref 150–400)
RBC: 4.05 MIL/uL — ABNORMAL LOW (ref 4.22–5.81)
RDW: 12.3 % (ref 11.5–15.5)
WBC: 8.9 10*3/uL (ref 4.0–10.5)
nRBC: 0 % (ref 0.0–0.2)

## 2020-03-24 LAB — BASIC METABOLIC PANEL
Anion gap: 9 (ref 5–15)
BUN: 16 mg/dL (ref 8–23)
CO2: 28 mmol/L (ref 22–32)
Calcium: 9.7 mg/dL (ref 8.9–10.3)
Chloride: 100 mmol/L (ref 98–111)
Creatinine, Ser: 0.82 mg/dL (ref 0.61–1.24)
GFR calc Af Amer: >60 mL/min (ref >60)
GFR calc non Af Amer: >60 mL/min (ref >60)
Glucose, Bld: 106 mg/dL — ABNORMAL HIGH (ref 70–99)
Potassium: 3.9 mmol/L (ref 3.5–5.1)
Sodium: 137 mmol/L (ref 135–145)

## 2020-03-24 MED ORDER — DOXYCYCLINE HYCLATE 100 MG PO TABS
100.0000 mg | ORAL_TABLET | Freq: Once | ORAL | Status: AC
  Administered 2020-03-24: 100 mg via ORAL
  Filled 2020-03-24: qty 1

## 2020-03-24 MED ORDER — DOXYCYCLINE HYCLATE 100 MG PO CAPS: 100.0000 mg | ORAL_CAPSULE | Freq: Two times a day (BID) | ORAL | 0 refills | Status: DC

## 2020-03-24 NOTE — Telephone Encounter (Signed)
In ER with hemoptysis last night.CT showed progression of previous ?atypical pneumonia. ER started doxy. He will come to see me, taking wife's appointment on Oct 14 and we will reschedule her. He will stop BASA x 3 day. He will need bronchoscopy.

## 2020-03-24 NOTE — ED Notes (Signed)
Pt coughed up bloody mucous, about 52mL; pt shows no signs of distress upon re-assessment; EDP made aware

## 2020-03-24 NOTE — Telephone Encounter (Signed)
Called and spoke with patient's wife stating patient had been in ED all night - wants to make sure that Dr. Annamaria Boots gets the notes and possibly have him call him back at [phone].  Dr. Annamaria Boots please advise

## 2020-03-24 NOTE — Telephone Encounter (Signed)
Ptcalled stating he was in the ER Yesterday. He is wondering if he still needs to take his olmesartan (BENICAR) 20 MG tablet along with the amlodipine. Please give him a call back about this. Thanks.

## 2020-03-27 NOTE — Telephone Encounter (Signed)
Yes, please continue with Benicar as well.  I agree that he needs to follow-up with Dr. Annamaria Boots (pulmonology).  Thanks

## 2020-03-28 NOTE — Telephone Encounter (Signed)
Left pt detailed message of below

## 2020-04-07 DIAGNOSIS — E0500 Thyrotoxicosis with diffuse goiter without thyrotoxic crisis or storm: Secondary | ICD-10-CM | POA: Diagnosis not present

## 2020-04-08 ENCOUNTER — Ambulatory Visit (INDEPENDENT_AMBULATORY_CARE_PROVIDER_SITE_OTHER): Payer: Medicare Other

## 2020-04-08 ENCOUNTER — Ambulatory Visit (INDEPENDENT_AMBULATORY_CARE_PROVIDER_SITE_OTHER): Payer: Medicare Other | Admitting: Internal Medicine

## 2020-04-08 ENCOUNTER — Other Ambulatory Visit: Payer: Self-pay

## 2020-04-08 ENCOUNTER — Telehealth: Payer: Self-pay | Admitting: *Deleted

## 2020-04-08 ENCOUNTER — Encounter: Payer: Self-pay | Admitting: Internal Medicine

## 2020-04-08 VITALS — BP 122/70 | HR 86 | Temp 96.3°F | Ht 74.0 in | Wt 163.8 lb

## 2020-04-08 DIAGNOSIS — R042 Hemoptysis: Secondary | ICD-10-CM

## 2020-04-08 DIAGNOSIS — J90 Pleural effusion, not elsewhere classified: Secondary | ICD-10-CM | POA: Diagnosis not present

## 2020-04-08 DIAGNOSIS — F17200 Nicotine dependence, unspecified, uncomplicated: Secondary | ICD-10-CM

## 2020-04-08 DIAGNOSIS — R911 Solitary pulmonary nodule: Secondary | ICD-10-CM

## 2020-04-08 DIAGNOSIS — J811 Chronic pulmonary edema: Secondary | ICD-10-CM | POA: Diagnosis not present

## 2020-04-08 LAB — PROTIME-INR
INR: 1 ratio (ref 0.8–1.0)
Prothrombin Time: 10.9 s (ref 9.6–13.1)

## 2020-04-08 LAB — APTT: aPTT: 32.4 s (ref 23.4–32.7)

## 2020-04-08 LAB — SEDIMENTATION RATE: Sed Rate: 10 mm/hr (ref 0–20)

## 2020-04-08 NOTE — Patient Instructions (Addendum)
Order- CXR    Dx hemoptysis  Order- lab- PT/INR, PTT    Dx hemoptysis   We will have Dr Loanne Drilling talk with you about bronchoscopy  Please call as needed

## 2020-04-08 NOTE — Assessment & Plan Note (Signed)
Tiny lung nodules have been tracked since before 2010. Eval for atypical infection, including bronchoscopy has been unrevealing.

## 2020-04-08 NOTE — H&P (View-Only) (Signed)
Subjective:    Patient ID: David Buck, male    DOB: 24-Feb-1941, 79 y.o.   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  =================================================================  08/10/19- 79 year old male smoker (50+ pk yrs) followed for small lung nodules/right upper lobe nodular scarring/benign bronchoscopy 49/44/9675-[id number] AFB, complicated by vasomotor rhinitis, -----f/u lung nodule/COPD. Breathing is at patient's baseline. Loratadine,  Had covid vax x 2     Now about 4-5 cigs/ day Little cough or wheeze, feels breathing stable. Had w/u for dizziness/ balance probs. Turned out to be due to thyroid-related vision changes.  CXR 05/17/2019- Lungs are hyperexpanded. Small focus of stable scarring in the right upper lobe IMPRESSION: 1. No acute cardiopulmonary disease. 2. 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 He resumed BASA x 1 day, saw a little more blood, and stopped BASA again with no more blood seen.  He denies other bleeding or bruising; Minimal cough, no adenopathy. Has felt a little "clammy at night once or twice  without real sweats or chills. 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 This is now managed with occular injections and he reports being currently euthyroid.  He continues to smoke a few cigarettes daily. CTa chest 03/23/20-  IMPRESSION: 1. No evidence of significant pulmonary embolus. 2. Patchy ground-glass nodules, bronchial wall thickening, and peribronchial nodular infiltrates in the right upper and right middle lung and minimally in both lung bases. Changes may represent early multifocal pneumonia, bronchopneumonia, or atypical pneumonia. Some similar changes were seen on the previous study but there is progression today. 3. Emphysema and aortic atherosclerosis. Aortic Atherosclerosis (ICD10-I70.0) and Emphysema (ICD10-J43.9).  ROS-see HPI  + = positive Constitutional:   No-   weight loss, night sweats, fevers, chills, fatigue, lassitude. HEENT:   No-  headaches, difficulty swallowing, tooth/dental problems, sore throat,       No-  sneezing, itching, ear ache, nasal congestion, +post nasal drip,  CV:  No-   chest pain, orthopnea, PND, swelling in lower extremities, anasarca, dizziness, palpitations Resp: No-   shortness of breath with exertion or at rest.             +productive cough,  No- non-productive cough, + coughing up of blood.              No-   change in color of mucus.  No- wheezing.   Skin: No-   rash or lesions. GI:  No-   heartburn, indigestion, abdominal pain, nausea, vomiting,  GU:  MS:  No-   joint pain or swelling.  Neuro-     nothing unusual Psych:  No- change in mood or affect. No depression or anxiety.  No memory loss.  Objective:   Physical Exam General- Alert, Oriented, Affect-appropriate, Distress- none acute.+ tall slender man  Skin- rash-none, lesions- none, excoriation- none Lymphadenopathy- none Head- atraumatic            Eyes- + patch over R eye.            Ears- Hearing, canals  normal            Nose- Clear, No- Septal dev, +mucus postnasal drip, polyps, erosion, perforation             Throat- Mallampati II , mucosa clear , drainage- none, tonsils- atrophic  dentures Neck- flexible , trachea midline, no stridor , thyroid nl, carotid no bruit Chest - symmetrical excursion , unlabored           Heart/CV- RRR , no murmur , no gallop  , no rub, nl s1 s2                           - JVD- none , edema- none, stasis changes- none, varices- none           Lung- Clear, unlabored, wheeze- none, cough- none , dullness-none, rub- none           Chest wall-  Abd-  Br/ Gen/ Rectal- Not done, not indicated Extrem- cyanosis- none, clubbing, none, atrophy- none, strength- nl Neuro-+ mild head bobbing tremor    Assessment & Plan:

## 2020-04-08 NOTE — Progress Notes (Signed)
Subjective:    Patient ID: David Buck, male    DOB: [birth year], 79 y.o.   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  =================================================================  08/10/19- 79 year old male smoker (50+ pk yrs) followed for small lung nodules/right upper lobe nodular scarring/benign bronchoscopy 67/89/3810-[id number] AFB, complicated by vasomotor rhinitis, -----f/u lung nodule/COPD. Breathing is at patient's baseline. Loratadine,  Had covid vax x 2     Now about 4-5 cigs/ day Little cough or wheeze, feels breathing stable. Had w/u for dizziness/ balance probs. Turned out to be due to thyroid-related vision changes.  CXR 05/17/2019- Lungs are hyperexpanded. Small focus of stable scarring in the right upper lobe IMPRESSION: 1. No acute cardiopulmonary disease. 2. 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 He resumed BASA x 1 day, saw a little more blood, and stopped BASA again with no more blood seen.  He denies other bleeding or bruising; Minimal cough, no adenopathy. Has felt a little "clammy at night once or twice  without real sweats or chills. 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 This is now managed with occular injections and he reports being currently euthyroid.  He continues to smoke a few cigarettes daily. CTa chest 03/23/20-  IMPRESSION: 1. No evidence of significant pulmonary embolus. 2. Patchy ground-glass nodules, bronchial wall thickening, and peribronchial nodular infiltrates in the right upper and right middle lung and minimally in both lung bases. Changes may represent early multifocal pneumonia, bronchopneumonia, or atypical pneumonia. Some similar changes were seen on the previous study but there is progression today. 3. Emphysema and aortic atherosclerosis. Aortic Atherosclerosis (ICD10-I70.0) and Emphysema (ICD10-J43.9).  ROS-see HPI  + = positive Constitutional:   No-   weight loss, night sweats, fevers, chills, fatigue, lassitude. HEENT:   No-  headaches, difficulty swallowing, tooth/dental problems, sore throat,       No-  sneezing, itching, ear ache, nasal congestion, +post nasal drip,  CV:  No-   chest pain, orthopnea, PND, swelling in lower extremities, anasarca, dizziness, palpitations Resp: No-   shortness of breath with exertion or at rest.             +productive cough,  No- non-productive cough, + coughing up of blood.              No-   change in color of mucus.  No- wheezing.   Skin: No-   rash or lesions. GI:  No-   heartburn, indigestion, abdominal pain, nausea, vomiting,  GU:  MS:  No-   joint pain or swelling.  Neuro-     nothing unusual Psych:  No- change in mood or affect. No depression or anxiety.  No memory loss.  Objective:   Physical Exam General- Alert, Oriented, Affect-appropriate, Distress- none acute.+ tall slender man  Skin- rash-none, lesions- none, excoriation- none Lymphadenopathy- none Head- atraumatic            Eyes- + patch over R eye.            Ears- Hearing, canals  normal            Nose- Clear, No- Septal dev, +mucus postnasal drip, polyps, erosion, perforation             Throat- Mallampati II , mucosa clear , drainage- none, tonsils- atrophic  dentures Neck- flexible , trachea midline, no stridor , thyroid nl, carotid no bruit Chest - symmetrical excursion , unlabored           Heart/CV- RRR , no murmur , no gallop  , no rub, nl s1 s2                           - JVD- none , edema- none, stasis changes- none, varices- none           Lung- Clear, unlabored, wheeze- none, cough- none , dullness-none, rub- none           Chest wall-  Abd-  Br/ Gen/ Rectal- Not done, not indicated Extrem- cyanosis- none, clubbing, none, atrophy- none, strength- nl Neuro-+ mild head bobbing tremor    Assessment & Plan:

## 2020-04-08 NOTE — Telephone Encounter (Signed)
Called and spoke with patient. Let them know their Bronch is scheduled for 04/14/20 with Dr. Loanne Drilling at [facility] at 1300.  Patient was instructed to arrive at hospital at 1130. They were instructed to bring someone with them as they will not be able to drive home from procedure. Patient instructed not to have anything to eat or drink after midnight. Patient needs to hold their blood pressure medication and antianxiety medication that morning per JE  Patient's covid screening is scheduled at [facility] location for 04/11/20 at 2:30pm.  Patient voiced understanding, nothing further needed  Routing to [place] as FYI

## 2020-04-08 NOTE — Telephone Encounter (Signed)
-----   Message from [place], MD sent at 04/08/2020  2:08 PM EDT ----- Regarding: Bronchoscopy Please schedule the following:  Diagnosis: Hemoptysis Procedure: Bronchoscopy with BAL  Anesthesia: Moderate sedation Do you need Fluro? No Priority: Routine Date: 10/21  Alternate Date: 10/22  Time: AM/ PM (Patient prefers mid-day but anytime is ok) Location: [facility] [place] Does patient have OSA? No DM? No Or Latex allergy? No Medication Restriction: Hold anti anxiety and blood pressure medictions Anticoagulate/Antiplatelet: Hold aspirin Pre-op Labs Ordered: CBC, CMP, PT/INR, PTT Imaging request: None (If, SuperDimension CT Chest, please have STAT courier sent to [facility].)  Please coordinate Pre-op COVID Testing

## 2020-04-08 NOTE — Assessment & Plan Note (Signed)
Smoker x many years with long hx of tiny pulmonary nodules. First thoughts would be to search for cancer or atypical infection. Dr Loanne Drilling has kindly agreed to do bronchoscopy. RUL looks like key anatomic area of suspicion.  Benign bronchitic hemoptysis on aspirin is not excluded. Consider possibility the chronic nodules are part of an autoimmune process/ vasculitis, possibly related to thee autoimmune thyroid disease. Plan- bronchoscopy, labs for Coags, Sed rate, ANA, ANCA, stay off aspirin for now.

## 2020-04-08 NOTE — Assessment & Plan Note (Signed)
Even moving to [facility] facility wasn't enough to force complete cessation. We continue to encourage efforts.

## 2020-04-11 ENCOUNTER — Other Ambulatory Visit (HOSPITAL_COMMUNITY)
Admission: RE | Admit: 2020-04-11 | Discharge: 2020-04-11 | Disposition: A | Discharge status: Home / Self Care | Payer: Medicare Other | Source: Ambulatory Visit | Attending: Pulmonary Disease | Admitting: Pulmonary Disease

## 2020-04-11 DIAGNOSIS — Z20822 Contact with and (suspected) exposure to covid-19: Secondary | ICD-10-CM | POA: Diagnosis not present

## 2020-04-11 DIAGNOSIS — Z01812 Encounter for preprocedural laboratory examination: Secondary | ICD-10-CM | POA: Insufficient documentation

## 2020-04-11 LAB — ANTI-NUCLEAR AB-TITER (ANA TITER)
ANA TITER: 1:40 {titer} — ABNORMAL HIGH
ANA Titer 1: 1:40 {titer} — ABNORMAL HIGH

## 2020-04-11 LAB — MPO/PR-3 (ANCA) ANTIBODIES
Myeloperoxidase Abs: <1 AI
Serine Protease 3: <1 AI

## 2020-04-11 LAB — SARS CORONAVIRUS 2 (TAT 6-24 HRS): SARS Coronavirus 2: NEGATIVE

## 2020-04-11 LAB — ANA: Anti Nuclear Antibody (ANA): POSITIVE — AB

## 2020-04-11 LAB — ANCA SCREEN W REFLEX TITER: ANCA Screen: NEGATIVE

## 2020-04-11 NOTE — Progress Notes (Signed)
Called and spoke with patient about xray results per Dr Annamaria Boots. All questions answered and patient expressed full understanding. Nothing further needed at this time.

## 2020-04-12 ENCOUNTER — Telehealth: Payer: Self-pay | Admitting: Internal Medicine

## 2020-04-12 ENCOUNTER — Telehealth: Payer: Self-pay

## 2020-04-12 NOTE — Telephone Encounter (Signed)
Pt was called given lab results verbalized understanding

## 2020-04-12 NOTE — Telephone Encounter (Signed)
-----   Message from Deneise Lever, MD sent at 04/11/2020  8:59 PM EDT ----- Labs- are ok. Minor elevation of ANA is unlikely to be important.Marland Kitchen

## 2020-04-13 NOTE — Telephone Encounter (Signed)
Lm for patient.  

## 2020-04-14 ENCOUNTER — Other Ambulatory Visit: Payer: Self-pay

## 2020-04-14 ENCOUNTER — Encounter (HOSPITAL_COMMUNITY): Payer: Self-pay | Admitting: Pulmonary Disease

## 2020-04-14 ENCOUNTER — Encounter (HOSPITAL_COMMUNITY)
Admission: RE | Disposition: A | Discharge status: Home / Self Care | Payer: Self-pay | Source: Home / Self Care | Attending: Pulmonary Disease

## 2020-04-14 ENCOUNTER — Ambulatory Visit (HOSPITAL_COMMUNITY)
Admission: RE | Admit: 2020-04-14 | Discharge: 2020-04-14 | Disposition: A | Discharge status: Home / Self Care | Payer: Medicare Other | Attending: Pulmonary Disease | Admitting: Pulmonary Disease

## 2020-04-14 DIAGNOSIS — I700 Atherosclerosis of aorta: Secondary | ICD-10-CM | POA: Insufficient documentation

## 2020-04-14 DIAGNOSIS — F17210 Nicotine dependence, cigarettes, uncomplicated: Secondary | ICD-10-CM | POA: Insufficient documentation

## 2020-04-14 DIAGNOSIS — J439 Emphysema, unspecified: Secondary | ICD-10-CM | POA: Diagnosis not present

## 2020-04-14 DIAGNOSIS — R042 Hemoptysis: Secondary | ICD-10-CM | POA: Diagnosis not present

## 2020-04-14 DIAGNOSIS — R918 Other nonspecific abnormal finding of lung field: Secondary | ICD-10-CM | POA: Insufficient documentation

## 2020-04-14 DIAGNOSIS — E079 Disorder of thyroid, unspecified: Secondary | ICD-10-CM | POA: Insufficient documentation

## 2020-04-14 HISTORY — PX: VIDEO BRONCHOSCOPY: SHX5072

## 2020-04-14 LAB — BODY FLUID CELL COUNT WITH DIFFERENTIAL
Eos, Fluid: 1 %
Lymphs, Fluid: 11 %
Lymphs, Fluid: 7 %
Monocyte-Macrophage-Serous Fluid: 2 % — ABNORMAL LOW (ref 50–90)
Monocyte-Macrophage-Serous Fluid: 54 % (ref 50–90)
Neutrophil Count, Fluid: 35 % — ABNORMAL HIGH (ref 0–25)
Neutrophil Count, Fluid: 90 % — ABNORMAL HIGH (ref 0–25)
Total Nucleated Cell Count, Fluid: 465 cu mm (ref 0–1000)
Total Nucleated Cell Count, Fluid: 51 cu mm (ref 0–1000)

## 2020-04-14 SURGERY — VIDEO BRONCHOSCOPY WITHOUT FLUORO
Anesthesia: Moderate Sedation

## 2020-04-14 MED ORDER — MIDAZOLAM HCL (PF) 5 MG/ML IJ SOLN
INTRAMUSCULAR | Status: AC
  Filled 2020-04-14: qty 2

## 2020-04-14 MED ORDER — FENTANYL CITRATE (PF) 100 MCG/2ML IJ SOLN
INTRAMUSCULAR | Status: AC
  Filled 2020-04-14: qty 4

## 2020-04-14 MED ORDER — DIPHENHYDRAMINE HCL 50 MG/ML IJ SOLN
INTRAMUSCULAR | Status: AC
  Filled 2020-04-14: qty 1

## 2020-04-14 MED ORDER — LIDOCAINE HCL URETHRAL/MUCOSAL 2 % EX GEL
1.0000 "application " | Freq: Once | CUTANEOUS | Status: AC
  Administered 2020-04-14: 1

## 2020-04-14 MED ORDER — LIDOCAINE HCL (PF) 4 % IJ SOLN
INTRAMUSCULAR | Status: AC
  Filled 2020-04-14: qty 5

## 2020-04-14 MED ORDER — LIDOCAINE HCL 1 % IJ SOLN
INTRAMUSCULAR | Status: AC
  Filled 2020-04-14: qty 20

## 2020-04-14 MED ORDER — LIDOCAINE HCL (PF) 4 % IJ SOLN
4.0000 mL | Freq: Once | INTRAMUSCULAR | Status: AC
  Administered 2020-04-14: 4 mL

## 2020-04-14 MED ORDER — LEVOFLOXACIN 500 MG PO TABS: 500.0000 mg | ORAL_TABLET | Freq: Every day | ORAL | 0 refills | Status: AC

## 2020-04-14 MED ORDER — PHENYLEPHRINE HCL 0.25 % NA SOLN: 1.0000 | Freq: Four times a day (QID) | NASAL | Status: DC | PRN

## 2020-04-14 MED ORDER — LIDOCAINE HCL 1 % IJ SOLN
INTRAMUSCULAR | Status: DC | PRN
  Administered 2020-04-14 (×2): 4 mL

## 2020-04-14 MED ORDER — LIDOCAINE HCL URETHRAL/MUCOSAL 2 % EX GEL: 1.0000 "application " | Freq: Once | CUTANEOUS | Status: DC

## 2020-04-14 MED ORDER — FENTANYL CITRATE (PF) 100 MCG/2ML IJ SOLN
INTRAMUSCULAR | Status: DC | PRN
Start: 2020-04-14 — End: 2020-04-14
  Administered 2020-04-14 (×3): 50 ug via INTRAVENOUS

## 2020-04-14 MED ORDER — LIDOCAINE HCL URETHRAL/MUCOSAL 2 % EX GEL
CUTANEOUS | Status: AC
  Filled 2020-04-14: qty 30

## 2020-04-14 MED ORDER — LACTATED RINGERS IV SOLN: INTRAVENOUS | Status: DC

## 2020-04-14 MED ORDER — BUTAMBEN-TETRACAINE-BENZOCAINE 2-2-14 % EX AERO: 1.0000 | INHALATION_SPRAY | Freq: Once | CUTANEOUS | Status: DC

## 2020-04-14 MED ORDER — MIDAZOLAM HCL (PF) 10 MG/2ML IJ SOLN
INTRAMUSCULAR | Status: DC | PRN
  Administered 2020-04-14: 1 mg via INTRAVENOUS

## 2020-04-14 NOTE — Discharge Instructions (Signed)
Flexible Bronchoscopy, Care After This sheet gives you information about how to care for yourself after your test. Your doctor may also give you more specific instructions. If you have problems or questions, contact your doctor. Follow these instructions at home: Eating and drinking Do not eat or drink anything (not even water) for 2 hours after your test, or until your numbing medicine (local anesthetic) wears off. When your numbness is gone and your cough and gag reflexes have come back, you may: Eat only soft foods. Slowly drink liquids. The day after the test, go back to your normal diet. Driving Do not drive for 24 hours if you were given a medicine to help you relax (sedative). Do not drive or use heavy machinery while taking prescription pain medicine. General instructions  Take over-the-counter and prescription medicines only as told by your doctor. Return to your normal activities as told. Ask what activities are safe for you. Do not use any products that have nicotine or tobacco in them. This includes cigarettes and e-cigarettes. If you need help quitting, ask your doctor. Keep all follow-up visits as told by your doctor. This is important. It is very important if you had a tissue sample (biopsy) taken. Get help right away if: You have shortness of breath that gets worse. You get light-headed. You feel like you are going to pass out (faint). You have chest pain. You cough up: More than a little blood. More blood than before. Summary Do not eat or drink anything (not even water) for 2 hours after your test, or until your numbing medicine wears off. Do not use cigarettes. Do not use e-cigarettes. Get help right away if you have chest pain. This information is not intended to replace advice given to you by your health care provider. Make sure you discuss any questions you have with your health care provider. Document Revised: 05/24/2017 Document Reviewed: 06/29/2016 Elsevier  Patient Education  2020 [employer].

## 2020-04-14 NOTE — Op Note (Signed)
[facility] Cardiopulmonary Patient Name: David Buck Procedure Date: 04/14/2020 MRN: [id number] Attending MD: Rodman Pickle , MD Date of Birth: 06/23/1941 CSN: [id number] Age: 79 Admit Type: Outpatient Ethnicity: Not Hispanic or Latino Procedure:             Bronchoscopy Indications:           Hemoptysis with abnormal CXR Providers:             Rodman Pickle, MD, Jeanella Cara, RN, Particia Nearing, RN, Wynonia Sours, RN, Tyrone Apple,                         Technician Referring MD:           Medicines:              Complications:         No immediate complications Estimated Blood Loss:  Estimated blood loss: none. Procedure:      Pre-Anesthesia Assessment:      - A History and Physical has been performed. The patient's medications,       allergies and sensitivities have been reviewed.      - Pre-procedure physical examination revealed no contraindications to       sedation.      - CV Examination: RRR, no murmurs, no S3 or S4.      - Respiratory Examination: clear to auscultation.      - Mental Status Examination: alert and oriented.      - ASA Grade Assessment: I - A normal healthy patient.      - The physical status of the patient was re-assessed after the procedure       and no complications noted.      After obtaining informed consent, the bronchoscope was passed under       direct vision. Throughout the procedure, the patient's blood pressure,       pulse, and oxygen saturations were monitored continuously. the BF-H190       ([id number]) Olympus Bronchoscope was introduced through the left nostril       and advanced to the tracheobronchial tree. The patient tolerated the       procedure well. Findings:      The nasopharynx/oropharynx appears normal. The larynx appears normal.       The vocal cords appear normal. The subglottic space is normal. The       trachea is of normal caliber. The carina is sharp. The tracheobronchial        tree was examined to at least the first subsegmental level. Bronchial       anatomy is normal; there are no endobronchial lesions, blood or       potential source of blood seen. Mucosa with mild erythema and thick       purulent secretions bilaterally present.      Bronchoalveolar lavage was performed in the RUL anterior segment (B3) of       the lung and sent for cell count, bacterial culture, viral smears &       culture, and fungal & AFB analysis and cytology. 120 mL of fluid were       instilled. 20 mL were returned. The return was clear. Mucous plugs were       present in the return fluid. Multiple specimens were  obtained, and each       sent for analysis.      Bronchoalveolar lavage was performed in the LUL and sent for cell count,       bacterial culture, viral smears & culture, and fungal & AFB analysis.       120 mL of fluid were instilled. 10 mL were returned. The return was       cloudy. Mucous plugs were present in the return fluid. [Multi samples]. Impression:      - Hemoptysis with abnormal CXR      - The airway examination was normal. No evidence of active bleeding. No       evidence of source of bleed      - Thick mucopurulent secretions present through airway      - Bronchoalveolar lavage was performed on anterior segment of RUL.      - Bronchoalveolar lavage was performed on LUL Moderate Sedation:      Moderate (conscious) sedation was administered by the nurse and       supervised by the physician performing the procedure. The following       parameters were monitored: oxygen saturation, heart rate, blood       pressure, and response to care. Total physician intraservice time was 13       minutes. Recommendation:      Start antibiotics for respiratory infection Procedure Code(s):      --- Professional ---      [phone], Bronchoscopy, rigid or flexible, including fluoroscopic guidance,       when performed; with bronchial alveolar lavage      99152, Moderate  sedation services provided by the same physician or       other qualified health care professional performing the diagnostic or       therapeutic service that the sedation supports, requiring the presence       of an independent trained observer to assist in the monitoring of the       patient's level of consciousness and physiological status; initial 15       minutes of intraservice time, patient age 57 years or older Diagnosis Code(s):      --- Professional ---      R04.2, Hemoptysis      R91.8, Other nonspecific abnormal finding of lung field CPT copyright 2019 American Medical Association. All rights reserved. The codes documented in this report are preliminary and upon coder review may  be revised to meet current compliance requirements. Rodman Pickle, MD 04/14/2020 2:22:32 PM This report has been signed electronically. Number of Addenda: 0 Scope In: Scope Out:

## 2020-04-14 NOTE — Interval H&P Note (Signed)
History and Physical Interval Note:  04/14/2020 1:03 PM  David Buck  has presented today for surgery, with the diagnosis of HEMOPTYSIS.  The various methods of treatment have been discussed with the patient and family. After consideration of risks, benefits and other options for treatment, the patient has consented to  Procedure(s): VIDEO BRONCHOSCOPY WITHOUT FLUORO (N/A) as a surgical intervention.  The patient's history has been reviewed, patient examined, no change in status, stable for surgery.  I have reviewed the patient's chart and labs.  Questions were answered to the patient's satisfaction.     Bob Eastwood Rodman Pickle

## 2020-04-15 ENCOUNTER — Encounter (HOSPITAL_COMMUNITY): Payer: Self-pay | Admitting: Pulmonary Disease

## 2020-04-15 LAB — CYTOLOGY - NON PAP

## 2020-04-15 LAB — ACID FAST SMEAR (AFB, MYCOBACTERIA)
Acid Fast Smear: NEGATIVE
Acid Fast Smear: NEGATIVE

## 2020-04-15 NOTE — Telephone Encounter (Signed)
ATC patient x2--unable to leave vm due to mailbox being full. Will close encounter per office protocol.

## 2020-04-17 LAB — CULTURE, RESPIRATORY W GRAM STAIN

## 2020-04-18 DIAGNOSIS — H33322 Round hole, left eye: Secondary | ICD-10-CM | POA: Diagnosis not present

## 2020-04-18 DIAGNOSIS — H35373 Puckering of macula, bilateral: Secondary | ICD-10-CM | POA: Diagnosis not present

## 2020-04-18 DIAGNOSIS — H442E1 Degenerative myopia with other maculopathy, right eye: Secondary | ICD-10-CM | POA: Diagnosis not present

## 2020-04-18 DIAGNOSIS — H442A2 Degenerative myopia with choroidal neovascularization, left eye: Secondary | ICD-10-CM | POA: Diagnosis not present

## 2020-04-20 NOTE — Telephone Encounter (Signed)
The levofloxacin(Levaquin) should be sufficient for now.  Final culture report may take 6 weeks. Fortunately the cytology check for cancer cells was negative.

## 2020-04-20 NOTE — Telephone Encounter (Signed)
CY - please advise. Thanks! 

## 2020-04-28 DIAGNOSIS — E0500 Thyrotoxicosis with diffuse goiter without thyrotoxic crisis or storm: Secondary | ICD-10-CM | POA: Diagnosis not present

## 2020-05-02 DIAGNOSIS — H33322 Round hole, left eye: Secondary | ICD-10-CM | POA: Diagnosis not present

## 2020-05-06 ENCOUNTER — Telehealth: Payer: Self-pay | Admitting: Internal Medicine

## 2020-05-06 NOTE — Telephone Encounter (Signed)
LVM for pt to rtn my call to schedule AWV with [facility]. Please schedule this appt if pt calls the office.  °

## 2020-05-16 ENCOUNTER — Ambulatory Visit (INDEPENDENT_AMBULATORY_CARE_PROVIDER_SITE_OTHER): Payer: Medicare Other | Admitting: Internal Medicine

## 2020-05-16 ENCOUNTER — Encounter: Payer: Self-pay | Admitting: Internal Medicine

## 2020-05-16 ENCOUNTER — Ambulatory Visit (INDEPENDENT_AMBULATORY_CARE_PROVIDER_SITE_OTHER): Payer: Medicare Other

## 2020-05-16 ENCOUNTER — Other Ambulatory Visit: Payer: Self-pay

## 2020-05-16 DIAGNOSIS — R5382 Chronic fatigue, unspecified: Secondary | ICD-10-CM | POA: Diagnosis not present

## 2020-05-16 DIAGNOSIS — Z0000 Encounter for general adult medical examination without abnormal findings: Secondary | ICD-10-CM

## 2020-05-16 DIAGNOSIS — E0500 Thyrotoxicosis with diffuse goiter without thyrotoxic crisis or storm: Secondary | ICD-10-CM

## 2020-05-16 DIAGNOSIS — R269 Unspecified abnormalities of gait and mobility: Secondary | ICD-10-CM | POA: Diagnosis not present

## 2020-05-16 DIAGNOSIS — F341 Dysthymic disorder: Secondary | ICD-10-CM

## 2020-05-16 DIAGNOSIS — R634 Abnormal weight loss: Secondary | ICD-10-CM | POA: Diagnosis not present

## 2020-05-16 DIAGNOSIS — H0589 Other disorders of orbit: Secondary | ICD-10-CM

## 2020-05-16 LAB — COMPREHENSIVE METABOLIC PANEL
ALT: 22 U/L (ref 0–53)
AST: 19 U/L (ref 0–37)
Albumin: 4.1 g/dL (ref 3.5–5.2)
Alkaline Phosphatase: 61 U/L (ref 39–117)
BUN: 23 mg/dL (ref 6–23)
CO2: 28 mEq/L (ref 19–32)
Calcium: 10.1 mg/dL (ref 8.4–10.5)
Chloride: 102 mEq/L (ref 96–112)
Creatinine, Ser: 1.04 mg/dL (ref 0.40–1.50)
GFR: 68.87 mL/min (ref >60.00)
Glucose, Bld: 88 mg/dL (ref 70–99)
Potassium: 4.1 mEq/L (ref 3.5–5.1)
Sodium: 138 mEq/L (ref 135–145)
Total Bilirubin: 0.5 mg/dL (ref 0.2–1.2)
Total Protein: 7.3 g/dL (ref 6.0–8.3)

## 2020-05-16 LAB — TSH: TSH: 3.11 u[IU]/mL (ref 0.35–4.50)

## 2020-05-16 LAB — T3, FREE: T3, Free: 3.1 pg/mL (ref 2.3–4.2)

## 2020-05-16 LAB — T4, FREE: Free T4: 0.67 ng/dL (ref 0.60–1.60)

## 2020-05-16 NOTE — Assessment & Plan Note (Signed)
Start Fluoxetine

## 2020-05-16 NOTE — Assessment & Plan Note (Signed)
Add Lion's mane B complex w/Niacin Vit D

## 2020-05-16 NOTE — Addendum Note (Signed)
Addended by: Trenda Moots on: 29/02/300 03:10 PM   Modules accepted: Orders

## 2020-05-16 NOTE — Assessment & Plan Note (Signed)
Wt Readings from Last 3 Encounters:  05/16/20 157 lb 6.4 oz (71.4 kg)  04/14/20 157 lb (71.2 kg)  04/08/20 163 lb 12.8 oz (74.3 kg)    Protein drink

## 2020-05-16 NOTE — Assessment & Plan Note (Signed)
Not taking

## 2020-05-16 NOTE — Progress Notes (Addendum)
I connected with David Buck today by telephone and verified that I am speaking with the correct person using two identifiers. Location patient: home Location provider: work Persons participating in the virtual visit: David Buck and [name], LPN.   I discussed the limitations, risks, security and privacy concerns of performing an evaluation and management service by telephone and the availability of in person appointments. I also discussed with the patient that there may be a patient responsible charge related to this service. The patient expressed understanding and verbally consented to this telephonic visit.    Interactive audio and video telecommunications were attempted between this provider and patient, however failed, due to patient having technical difficulties OR patient did not have access to video capability.  We continued and completed visit with audio only.  Some vital signs may be absent or patient reported.   Time Spent with patient on telephone encounter: 30 minutes  Subjective:   David Buck is a 79 y.o. male who presents for Medicare Annual/Subsequent preventive examination.  Review of Systems    No ROS. Medicare Wellness Visit. Additional risk factors are reflected in social history. Cardiac Risk Factors include: advanced age (>76men, >3 women);hypertension;male gender;smoking/ tobacco exposure Sleep Patterns: No sleep issues, feels rested on waking and sleeps 7-8 hours nightly. Home Safety/Smoke Alarms: Feels safe in home; uses home alarm. Smoke alarms in place. Living environment: 1-story home; Lives with spouse; no needs for DME; good support system. Seat Belt Safety/Bike Helmet: Wears seat belt.    Objective:    There were no vitals filed for this visit. There is no height or weight on file to calculate BMI.  Advanced Directives 05/16/2020 04/14/2020 03/23/2020 03/11/2020 05/17/2019 12/03/2017 10/22/2016  Does Patient Have a Medical  Advance Directive? Yes Yes No No No Yes Yes  Type of Advance Directive Living will;Healthcare Power of [place];Living will - - - [place];Living will [place];Living will  Does patient want to make changes to medical advance directive? No - Patient declined - - - - No - Patient declined -  Copy of [place] in Chart? No - copy requested No - copy requested - - - - No - copy requested  Would patient like information on creating a medical advance directive? - - - - - - -    Current Medications (verified) Outpatient Encounter Medications as of 05/16/2020  Medication Sig   amLODipine (NORVASC) 5 MG tablet Take 5 mg by mouth daily.   Cholecalciferol (VITAMIN D3) 50 MCG (2000 UT) TABS Take 2,000 Units by mouth every evening.   loratadine (CLARITIN) 10 MG tablet Take 10 mg by mouth daily as needed for allergies.    LORazepam (ATIVAN) 0.5 MG tablet Take 1 tablet (0.5 mg total) by mouth 2 (two) times daily as needed for anxiety. (Patient taking differently: Take 0.5 mg by mouth daily. )   Multiple Vitamin (MULTIVITAMIN WITH MINERALS) TABS tablet Take 1 tablet by mouth every evening.   olmesartan (BENICAR) 20 MG tablet Take 0.5 tablets (10 mg total) by mouth every evening.   Polyethyl Glycol-Propyl Glycol (SYSTANE ULTRA) 0.4-0.3 % SOLN Place 1 drop into both eyes every evening.   Ranibizumab (LUCENTIS IZ) 1 Dose by Intravitreal route every 28 (twenty-eight) days.    Saw Palmetto 160 MG CAPS Take 160 mg by mouth every evening.    Teprotumumab-trbw (TEPEZZA) 500 MG SOLR Tepezza 500 mg intravenous solution  Inject 20 mg every 3  weeks by intravenous route for 8 days.   vitamin C (ASCORBIC ACID) 500 MG tablet Take 500 mg by mouth every evening.    ZINC PICOLINATE PO Take 22 mg by mouth every evening.   No facility-administered encounter medications on file as of 05/16/2020.    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   Meniere's disease    Osteoarthritis of right knee    Dr Percell Miller   Pneumonia    hx of 2/17    Posterior vitreous detachment 2011   R, body- Dr Baird Cancer   Prostatitis    Tinnitus    Past Surgical History:  Procedure Laterality Date   bilateral cataract surgery      BRONCHOSCOPY  04-14-08   benign   INGUINAL HERNIA REPAIR  2005   Dr Hassell Done- bilateral    right knee menisc tear repair     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   Procedure: RIGHT TOTAL KNEE ARTHROPLASTY;  Surgeon: Gaynelle Arabian, MD;  Location: [facility] ORS;  Service: Orthopedics;  Laterality: Right;   VIDEO BRONCHOSCOPY N/A 04/14/2020   Procedure: VIDEO BRONCHOSCOPY WITHOUT FLUORO;  Surgeon: Margaretha Seeds, MD;  Location: [facility] ENDOSCOPY;  Service: Cardiopulmonary;  Laterality: N/A;   Family History  Problem Relation Age of Onset   Cancer Mother        ovarian and bowel   Other Father        cerebral hemorrage   Hypertension Other    Social History   Socioeconomic History   Marital status: Married    Spouse name: Not on file   Number of children: Not on file   Years of education: Not on file   Highest education level: Not on file  Occupational History   Not on file  Tobacco Use   Smoking status: Current Every Day Smoker    Packs/day: 1.00    Years: 50.00    Pack years: 50.00    Types: Cigarettes   Smokeless tobacco: Never Used   Tobacco comment: 2-3 per day  Vaping Use   Vaping Use: Never used  Substance and Sexual Activity   Alcohol use: Yes    Comment: occasional    Drug use: No   Sexual activity: Not on file  Other Topics Concern   Not on file  Social History Narrative   Not on file   Social Determinants of Health    Financial Resource Strain: Low Risk    Difficulty of Paying Living Expenses: Not hard at all  Food Insecurity: No Food Insecurity   Worried About Charity fundraiser in the Last Year: Never true   Ran Out of Food in the Last Year: Never true  Transportation Needs: No Transportation Needs   Lack of Transportation (Medical): No   Lack of Transportation (Non-Medical): No  Physical Activity: Inactive   Days of Exercise per Week: 0 days   Minutes of Exercise per Session: 0 min  Stress: No Stress Concern Present   Feeling of Stress : Not at all  Social Connections: Moderately Isolated   Frequency of Communication with Friends and Family: More than three times a week   Frequency of Social Gatherings with Friends  and Family: Never   Attends Religious Services: Never   Marine scientist or Organizations: No   Attends Music therapist: Never   Marital Status: Married    Tobacco Counseling Ready to quit: Not Answered Counseling given: Not Answered Comment: 2-3 per day   Clinical Intake:  Pre-visit preparation completed: Yes  Pain : No/denies pain     Nutritional Risks: None Diabetes: No  What is the last grade level you completed in school?: Bachelor's Degree  Diabetic? no  Interpreter Needed?: No  Information entered by :: [name], LPN.   Activities of Daily Living In your present state of health, do you have any difficulty performing the following activities: 05/16/2020  Hearing? N  Vision? 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 N  Walking or climbing stairs? N  Dressing or bathing? N  Doing errands, shopping? Y  Preparing Food and eating ? N  Using the Toilet? N  In the past six months, have you accidently leaked urine? N  Do you have problems with loss of bowel control? N  Managing your Medications? N  Managing your Finances? N  Housekeeping or managing your  Housekeeping? N  Some recent data might be hidden    Patient Care Team: [name], [name], MD as PCP - General Deneise Lever, MD (Pulmonary Disease) Gatha Mayer, MD (Gastroenterology) Thurman Coyer, DO as Consulting Physician (Sports Medicine) Franchot Gallo, MD as Consulting Physician (Urology) Gaynelle Arabian, MD as Consulting Physician (Orthopedic Surgery) Jolyn Nap, MD as Referring Physician (Ophthalmology) Alphia Moh, MD as Referring Physician (Cardiology) Katheran James., MD (Endocrinology)  Indicate any recent Medical Services you may have received from other than [facility] providers in the past year (date may be approximate).     Assessment:   This is a routine wellness examination for [place].  Hearing/Vision screen No exam data present  Dietary issues and exercise activities discussed: Current Exercise Habits: The patient does not participate in regular exercise at present, Exercise limited by: Other - see comments;respiratory conditions(s) (double vision)  Goals      Maintain current health status     Continue to be active and eat healthy       Depression Screen PHQ 2/9 Scores 05/16/2020 08/27/2019 10/28/2017 10/22/2016 10/22/2016 07/12/2015 07/04/2015  PHQ - 2 Score 0 0 0 0 0 0 0  Exception Documentation - - - - - Other- indicate reason in comment box Other- indicate reason in comment box    Fall Risk Fall Risk  05/16/2020 08/27/2019 10/28/2017 10/22/2016 07/12/2015  Falls in the past year? 0 0 No No No  Number falls in past yr: 0 - - - -  Injury with Fall? 0 - - - -  Risk for fall due to : No Fall Risks - - - Other (Comment)  Follow up Falls evaluation completed Falls evaluation completed - - -    Any stairs in or around the home? No  If so, are there any without handrails? No  Home free of loose throw rugs in walkways, pet beds, electrical cords, etc? Yes  Adequate lighting in your home to reduce risk of falls? Yes   ASSISTIVE  DEVICES UTILIZED TO PREVENT FALLS:  Life alert? No  Use of a cane, walker or w/c? No  Grab bars in the bathroom? Yes  Shower chair or bench in shower? Yes  Elevated toilet seat or a handicapped toilet?  No   TIMED UP AND GO:  Was the test performed? No .  Length of time to ambulate 10 feet: 0 sec.   Gait steady and fast without use of assistive device  Cognitive Function: Patient is cogitatively intact.        Immunizations Immunization History  Administered Date(s) Administered   BCG 04/29/2018   Fluad Quad(high Dose 65+) 04/10/2019   Influenza Split 04/10/2011, 04/02/2012   Influenza Whole 05/07/2008, 04/18/2009, 04/04/2010   Influenza, High Dose Seasonal PF 04/24/2016, 04/05/2017, 04/29/2018   Influenza,inj,Quad PF,6+ Mos 03/20/2013, 04/09/2014, 04/18/2015   Influenza-Unspecified 04/10/2011, 04/02/2012, 03/20/2013, 04/09/2014, 04/18/2015, 04/24/2016, 04/05/2017, 04/05/2020   Moderna SARS-COVID-2 Vaccination 06/30/2019, 08/05/2019   PPD Test 04/29/2018   Pneumococcal Conjugate-13 05/15/2013   Pneumococcal Polysaccharide-23 07/23/2007, 04/26/2017   Pneumococcal-Unspecified 07/23/2007, 05/15/2013, 04/26/2017   Td 05/09/2009   Tdap 12/02/2017    TDAP status: Up to date Flu Vaccine status: Up to date Pneumococcal vaccine status: Up to date Covid-19 vaccine status: Completed vaccines  Qualifies for Shingles Vaccine? Yes   Zostavax completed No   Shingrix Completed?: No.    Education has been provided regarding the importance of this vaccine. Patient has been advised to call insurance company to determine out of pocket expense if they have not yet received this vaccine. Advised may also receive vaccine at local pharmacy or Health Dept. Verbalized acceptance and understanding.  Screening Tests Health Maintenance  Topic Date Due   PNA vac Low Risk Adult (2 of 2 - PCV13) 04/26/2018   TETANUS/TDAP  12/03/2027   INFLUENZA VACCINE  Completed   COVID-19 Vaccine  Completed    Hepatitis C Screening  Completed    Health Maintenance  Health Maintenance Due  Topic Date Due   PNA vac Low Risk Adult (2 of 2 - PCV13) 04/26/2018    Colorectal cancer screening: No longer required.   Lung Cancer Screening: (Low Dose CT Chest recommended if Age 78-80 years, 30 pack-year currently smoking OR have quit w/in 15years.) does qualify.   Lung Cancer Screening Referral: no  Additional Screening:  Hepatitis C Screening: does qualify; Completed yes  Vision Screening: Recommended annual ophthalmology exams for early detection of glaucoma and other disorders of the eye. Is the patient up to date with their annual eye exam?  Yes  Who is the provider or what is the name of the office in which the patient attends annual eye exams? Ramonita Lab, MD If pt is not established with a provider, would they like to be referred to a provider to establish care? No .   Dental Screening: Recommended annual dental exams for proper oral hygiene  Community Resource Referral / Chronic Care Management: CRR required this visit?  No   CCM required this visit?  No      Plan:     I have personally reviewed and noted the following in the patient's chart:   Medical and social history Use of alcohol, tobacco or illicit drugs  Current medications and supplements Functional ability and status Nutritional status Physical activity Advanced directives List of other physicians Hospitalizations, surgeries, and ER visits in previous 12 months Vitals Screenings to include cognitive, depression, and falls Referrals and appointments  In addition, I have reviewed and discussed with patient certain preventive protocols, quality metrics, and best practice recommendations. A written personalized care plan for preventive services as well as general preventive health recommendations were provided to patient.     Sheral Flow, LPN   [id number]   Nurse Notes:  Patient is cogitatively  intact. There were no vitals filed for this visit. There is no height or weight on file to calculate BMI. Patient stated that he has no issues with gait or balance; does not use any assistive devices.  Medical screening examination/treatment/procedure(s) were performed by non-physician practitioner and as supervising physician I was immediately available for consultation/collaboration.  I agree with above. Lew Dawes, MD

## 2020-05-16 NOTE — Patient Instructions (Signed)
Use [employer] Peroxicare tooth paste     B-complex with Niacin 100 mg

## 2020-05-16 NOTE — Patient Instructions (Signed)
David Buck , Thank you for taking time to come for your Medicare Wellness Visit. I appreciate your ongoing commitment to your health goals. Please review the following plan we discussed and let me know if I can assist you in the future.   Screening recommendations/referrals: Colonoscopy: 05/30/2010; no repeat due to age Recommended yearly ophthalmology/optometry visit for glaucoma screening and checkup Recommended yearly dental visit for hygiene and checkup  Vaccinations: Influenza vaccine: 04/05/2020 Pneumococcal vaccine: up to date Tdap vaccine: 12/02/2017 Shingles vaccine: never done   Covid-19: up to date  Advanced directives: Please bring a copy of your health care power of attorney and living will to the office at your convenience.  Conditions/risks identified: Yes. Reviewed health maintenance screenings with patient today and relevant education, vaccines, and/or referrals were provided. Continue doing brain stimulating activities (puzzles, reading, adult coloring books, staying active) to keep memory sharp. Continue to eat heart healthy diet (full of fruits, vegetables, whole grains, lean protein, water--limit salt, fat, and sugar intake) and increase physical activity as tolerated.  Next appointment: Please schedule your next Medicare Wellness Visit with your Nurse Health Advisor in 1 year by calling [phone].  Preventive Care 74 Years and Older, Male Preventive care refers to lifestyle choices and visits with your health care provider that can promote health and wellness. What does preventive care include?  A yearly physical exam. This is also called an annual well check.  Dental exams once or twice a year.  Routine eye exams. Ask your health care provider how often you should have your eyes checked.  Personal lifestyle choices, including:  Daily care of your teeth and gums.  Regular physical activity.  Eating a healthy diet.  Avoiding tobacco and drug  use.  Limiting alcohol use.  Practicing safe sex.  Taking low doses of aspirin every day.  Taking vitamin and mineral supplements as recommended by your health care provider. What happens during an annual well check? The services and screenings done by your health care provider during your annual well check will depend on your age, overall health, lifestyle risk factors, and family history of disease. Counseling  Your health care provider may ask you questions about your:  Alcohol use.  Tobacco use.  Drug use.  Emotional well-being.  Home and relationship well-being.  Sexual activity.  Eating habits.  History of falls.  Memory and ability to understand (cognition).  Work and work Statistician. Screening  You may have the following tests or measurements:  Height, weight, and BMI.  Blood pressure.  Lipid and cholesterol levels. These may be checked every 5 years, or more frequently if you are over 38 years old.  Skin check.  Lung cancer screening. You may have this screening every year starting at age 75 if you have a 30-pack-year history of smoking and currently smoke or have quit within the past 15 years.  Fecal occult blood test (FOBT) of the stool. You may have this test every year starting at age 64.  Flexible sigmoidoscopy or colonoscopy. You may have a sigmoidoscopy every 5 years or a colonoscopy every 10 years starting at age 56.  Prostate cancer screening. Recommendations will vary depending on your family history and other risks.  Hepatitis C blood test.  Hepatitis B blood test.  Sexually transmitted disease (STD) testing.  Diabetes screening. This is done by checking your blood sugar (glucose) after you have not eaten for a while (fasting). You may have this done every 1-3 years.  Abdominal aortic aneurysm (AAA)  screening. You may need this if you are a current or former smoker.  Osteoporosis. You may be screened starting at age 23 if you are at  high risk. Talk with your health care provider about your test results, treatment options, and if necessary, the need for more tests. Vaccines  Your health care provider may recommend certain vaccines, such as:  Influenza vaccine. This is recommended every year.  Tetanus, diphtheria, and acellular pertussis (Tdap, Td) vaccine. You may need a Td booster every 10 years.  Zoster vaccine. You may need this after age 69.  Pneumococcal 13-valent conjugate (PCV13) vaccine. One dose is recommended after age 24.  Pneumococcal polysaccharide (PPSV23) vaccine. One dose is recommended after age 32. Talk to your health care provider about which screenings and vaccines you need and how often you need them. This information is not intended to replace advice given to you by your health care provider. Make sure you discuss any questions you have with your health care provider. Document Released: 07/08/2015 Document Revised: 02/29/2016 Document Reviewed: 04/12/2015 Elsevier Interactive Patient Education  2017 [place] Prevention in the Home Falls can cause injuries. They can happen to people of all ages. There are many things you can do to make your home safe and to help prevent falls. What can I do on the outside of my home?  Regularly fix the edges of walkways and driveways and fix any cracks.  Remove anything that might make you trip as you walk through a door, such as a raised step or threshold.  Trim any bushes or trees on the path to your home.  Use bright outdoor lighting.  Clear any walking paths of anything that might make someone trip, such as rocks or tools.  Regularly check to see if handrails are loose or broken. Make sure that both sides of any steps have handrails.  Any raised decks and porches should have guardrails on the edges.  Have any leaves, snow, or ice cleared regularly.  Use sand or salt on walking paths during winter.  Clean up any spills in your garage  right away. This includes oil or grease spills. What can I do in the bathroom?  Use night lights.  Install grab bars by the toilet and in the tub and shower. Do not use towel bars as grab bars.  Use non-skid mats or decals in the tub or shower.  If you need to sit down in the shower, use a plastic, non-slip stool.  Keep the floor dry. Clean up any water that spills on the floor as soon as it happens.  Remove soap buildup in the tub or shower regularly.  Attach bath mats securely with double-sided non-slip rug tape.  Do not have throw rugs and other things on the floor that can make you trip. What can I do in the bedroom?  Use night lights.  Make sure that you have a light by your bed that is easy to reach.  Do not use any sheets or blankets that are too big for your bed. They should not hang down onto the floor.  Have a firm chair that has side arms. You can use this for support while you get dressed.  Do not have throw rugs and other things on the floor that can make you trip. What can I do in the kitchen?  Clean up any spills right away.  Avoid walking on wet floors.  Keep items that you use a lot in easy-to-reach  places.  If you need to reach something above you, use a strong step stool that has a grab bar.  Keep electrical cords out of the way.  Do not use floor polish or wax that makes floors slippery. If you must use wax, use non-skid floor wax.  Do not have throw rugs and other things on the floor that can make you trip. What can I do with my stairs?  Do not leave any items on the stairs.  Make sure that there are handrails on both sides of the stairs and use them. Fix handrails that are broken or loose. Make sure that handrails are as long as the stairways.  Check any carpeting to make sure that it is firmly attached to the stairs. Fix any carpet that is loose or worn.  Avoid having throw rugs at the top or bottom of the stairs. If you do have throw rugs,  attach them to the floor with carpet tape.  Make sure that you have a light switch at the top of the stairs and the bottom of the stairs. If you do not have them, ask someone to add them for you. What else can I do to help prevent falls?  Wear shoes that:  Do not have high heels.  Have rubber bottoms.  Are comfortable and fit you well.  Are closed at the toe. Do not wear sandals.  If you use a stepladder:  Make sure that it is fully opened. Do not climb a closed stepladder.  Make sure that both sides of the stepladder are locked into place.  Ask someone to hold it for you, if possible.  Clearly mark and make sure that you can see:  Any grab bars or handrails.  First and last steps.  Where the edge of each step is.  Use tools that help you move around (mobility aids) if they are needed. These include:  Canes.  Walkers.  Scooters.  Crutches.  Turn on the lights when you go into a dark area. Replace any light bulbs as soon as they burn out.  Set up your furniture so you have a clear path. Avoid moving your furniture around.  If any of your floors are uneven, fix them.  If there are any pets around you, be aware of where they are.  Review your medicines with your doctor. Some medicines can make you feel dizzy. This can increase your chance of falling. Ask your doctor what other things that you can do to help prevent falls. This information is not intended to replace advice given to you by your health care provider. Make sure you discuss any questions you have with your health care provider. Document Released: 04/07/2009 Document Revised: 11/17/2015 Document Reviewed: 07/16/2014 Elsevier Interactive Patient Education  2017 [employer].

## 2020-05-16 NOTE — Progress Notes (Signed)
Subjective:  Patient ID: David Buck, male    DOB: 04/01/1941  Age: 79 y.o. MRN: [id number]  CC: Follow-up (3 month f/u)   HPI David Buck presents for hemoptysis, TIA, hypothyroidism f/u C/o infection in the tooth C/o loss   Outpatient Medications Prior to Visit  Medication Sig Dispense Refill  . amLODipine (NORVASC) 5 MG tablet Take 5 mg by mouth daily.    . Cholecalciferol (VITAMIN D3) 50 MCG (2000 UT) TABS Take 2,000 Units by mouth every evening.    . loratadine (CLARITIN) 10 MG tablet Take 10 mg by mouth daily as needed for allergies.     Marland Kitchen LORazepam (ATIVAN) 0.5 MG tablet Take 1 tablet (0.5 mg total) by mouth 2 (two) times daily as needed for anxiety. (Patient taking differently: Take 0.5 mg by mouth daily. ) 60 tablet 1  . Multiple Vitamin (MULTIVITAMIN WITH MINERALS) TABS tablet Take 1 tablet by mouth every evening.    . olmesartan (BENICAR) 20 MG tablet Take 0.5 tablets (10 mg total) by mouth every evening.    Vladimir Faster Glycol-Propyl Glycol (SYSTANE ULTRA) 0.4-0.3 % SOLN Place 1 drop into both eyes every evening.    . Ranibizumab (LUCENTIS IZ) 1 Dose by Intravitreal route every 28 (twenty-eight) days.     . Saw Palmetto 160 MG CAPS Take 160 mg by mouth every evening.     . Teprotumumab-trbw (TEPEZZA) 500 MG SOLR Tepezza 500 mg intravenous solution  Inject 20 mg every 3 weeks by intravenous route for 8 days.    . vitamin C (ASCORBIC ACID) 500 MG tablet Take 500 mg by mouth every evening.     Marland Kitchen ZINC PICOLINATE PO Take 22 mg by mouth every evening.     No facility-administered medications prior to visit.    ROS: Review of Systems  Constitutional: Negative for appetite change, fatigue and unexpected weight change.  HENT: Negative for congestion, nosebleeds, sneezing, sore throat and trouble swallowing.   Eyes: Positive for visual disturbance. Negative for itching.  Respiratory: Negative for cough.   Cardiovascular: Negative for chest pain, palpitations and leg  swelling.  Gastrointestinal: Negative for abdominal distention, blood in stool, diarrhea and nausea.  Genitourinary: Negative for frequency and hematuria.  Musculoskeletal: Negative for back pain, gait problem, joint swelling and neck pain.  Skin: Negative for rash.  Neurological: Negative for dizziness, tremors, speech difficulty, weakness and light-headedness.  Psychiatric/Behavioral: Negative for agitation, dysphoric mood and sleep disturbance. The patient is not nervous/anxious.     Objective:  BP 118/68 (BP Location: Left Arm)   Pulse 74   Temp 97.7 F (36.5 C) (Oral)   Wt 157 lb 6.4 oz (71.4 kg)   SpO2 97%   BMI 20.21 kg/m   BP Readings from Last 3 Encounters:  05/16/20 118/68  04/14/20 (!) 152/83  04/08/20 122/70    Wt Readings from Last 3 Encounters:  05/16/20 157 lb 6.4 oz (71.4 kg)  04/14/20 157 lb (71.2 kg)  04/08/20 163 lb 12.8 oz (74.3 kg)    Physical Exam Constitutional:      General: He is not in acute distress.    Appearance: He is well-developed.     Comments: NAD  Eyes:     Conjunctiva/sclera: Conjunctivae normal.     Pupils: Pupils are equal, round, and reactive to light.  Neck:     Thyroid: No thyromegaly.     Vascular: No JVD.  Cardiovascular:     Rate and Rhythm: Normal rate and regular rhythm.  Heart sounds: Normal heart sounds. No murmur heard.  No friction rub. No gallop.   Pulmonary:     Effort: Pulmonary effort is normal. No respiratory distress.     Breath sounds: Normal breath sounds. No wheezing or rales.  Chest:     Chest wall: No tenderness.  Abdominal:     General: Bowel sounds are normal. There is no distension.     Palpations: Abdomen is soft. There is no mass.     Tenderness: There is no abdominal tenderness. There is no guarding or rebound.  Musculoskeletal:        General: No tenderness. Normal range of motion.     Cervical back: Normal range of motion.  Lymphadenopathy:     Cervical: No cervical adenopathy.  Skin:     General: Skin is warm and dry.     Findings: No rash.  Neurological:     Mental Status: He is alert and oriented to person, place, and time.     Cranial Nerves: No cranial nerve deficit.     Motor: No abnormal muscle tone.     Coordination: Coordination normal.     Gait: Gait normal.     Deep Tendon Reflexes: Reflexes are normal and symmetric.  Psychiatric:        Behavior: Behavior normal.        Thought Content: Thought content normal.        Judgment: Judgment normal.     Lab Results  Component Value Date   WBC 8.9 03/23/2020   HGB 12.8 (L) 03/23/2020   HCT 38.9 (L) 03/23/2020   PLT 237 03/23/2020   GLUCOSE 106 (H) 03/23/2020   CHOL 114 12/31/2018   TRIG 57.0 12/31/2018   HDL 53.30 12/31/2018   LDLCALC 49 12/31/2018   ALT 37 03/11/2020   AST 62 (H) 03/11/2020   NA 137 03/23/2020   K 3.9 03/23/2020   CL 100 03/23/2020   CREATININE 0.82 03/23/2020   BUN 16 03/23/2020   CO2 28 03/23/2020   TSH 0.25 (L) 12/29/2019   PSA 0.6 02/09/2020   INR 1.0 04/08/2020   HGBA1C 5.8 10/26/2010    No results found.  Assessment & Plan:    Walker Kehr, MD

## 2020-05-16 NOTE — Assessment & Plan Note (Addendum)
Use a trekking pole

## 2020-05-17 DIAGNOSIS — E063 Autoimmune thyroiditis: Secondary | ICD-10-CM | POA: Diagnosis not present

## 2020-05-17 DIAGNOSIS — H532 Diplopia: Secondary | ICD-10-CM | POA: Diagnosis not present

## 2020-05-17 LAB — FUNGUS CULTURE WITH STAIN

## 2020-05-17 LAB — FUNGUS CULTURE RESULT

## 2020-05-17 LAB — FUNGAL ORGANISM REFLEX

## 2020-05-26 DIAGNOSIS — E0500 Thyrotoxicosis with diffuse goiter without thyrotoxic crisis or storm: Secondary | ICD-10-CM | POA: Diagnosis not present

## 2020-05-28 LAB — ACID FAST CULTURE WITH REFLEXED SENSITIVITIES (MYCOBACTERIA)
Acid Fast Culture: NEGATIVE
Acid Fast Culture: NEGATIVE

## 2020-05-31 DIAGNOSIS — Z23 Encounter for immunization: Secondary | ICD-10-CM | POA: Diagnosis not present

## 2020-06-08 DIAGNOSIS — R002 Palpitations: Secondary | ICD-10-CM | POA: Diagnosis not present

## 2020-06-08 DIAGNOSIS — I10 Essential (primary) hypertension: Secondary | ICD-10-CM | POA: Diagnosis not present

## 2020-06-08 DIAGNOSIS — E0789 Other specified disorders of thyroid: Secondary | ICD-10-CM | POA: Diagnosis not present

## 2020-06-08 DIAGNOSIS — I471 Supraventricular tachycardia: Secondary | ICD-10-CM | POA: Diagnosis not present

## 2020-06-11 ENCOUNTER — Other Ambulatory Visit: Payer: Self-pay | Admitting: Internal Medicine

## 2020-06-14 DIAGNOSIS — H33322 Round hole, left eye: Secondary | ICD-10-CM | POA: Diagnosis not present

## 2020-06-14 DIAGNOSIS — H35373 Puckering of macula, bilateral: Secondary | ICD-10-CM | POA: Diagnosis not present

## 2020-06-14 DIAGNOSIS — H31092 Other chorioretinal scars, left eye: Secondary | ICD-10-CM | POA: Diagnosis not present

## 2020-06-20 ENCOUNTER — Encounter: Payer: Self-pay | Admitting: Internal Medicine

## 2020-06-20 ENCOUNTER — Ambulatory Visit (INDEPENDENT_AMBULATORY_CARE_PROVIDER_SITE_OTHER): Payer: Medicare Other | Admitting: Internal Medicine

## 2020-06-20 ENCOUNTER — Other Ambulatory Visit: Payer: Self-pay

## 2020-06-20 DIAGNOSIS — R6882 Decreased libido: Secondary | ICD-10-CM

## 2020-06-20 DIAGNOSIS — H6981 Other specified disorders of Eustachian tube, right ear: Secondary | ICD-10-CM

## 2020-06-20 DIAGNOSIS — H0589 Other disorders of orbit: Secondary | ICD-10-CM

## 2020-06-20 DIAGNOSIS — H9313 Tinnitus, bilateral: Secondary | ICD-10-CM

## 2020-06-20 DIAGNOSIS — E0500 Thyrotoxicosis with diffuse goiter without thyrotoxic crisis or storm: Secondary | ICD-10-CM | POA: Diagnosis not present

## 2020-06-20 MED ORDER — AZITHROMYCIN 250 MG PO TABS: ORAL_TABLET | ORAL | 0 refills | Status: DC

## 2020-06-20 MED ORDER — METHYLPREDNISOLONE 4 MG PO TBPK: ORAL_TABLET | ORAL | 0 refills | Status: DC

## 2020-06-20 NOTE — Progress Notes (Signed)
Subjective:  Patient ID: David Buck, male    DOB: [birth year]  Age: 79 y.o. MRN: [id number]  CC: Follow-up   HPI David Buck presents for roaring in the R ear off and on, better w/bending over x 3 wks [name] was on [place] - he had 8 treatments - he had side  loss of taste, smell  Outpatient Medications Prior to Visit  Medication Sig Dispense Refill  . amLODipine (NORVASC) 5 MG tablet Take 5 mg by mouth daily.    . Cholecalciferol (VITAMIN D3) 50 MCG (2000 UT) TABS Take 2,000 Units by mouth every evening.    . loratadine (CLARITIN) 10 MG tablet Take 10 mg by mouth daily as needed for allergies.    Marland Kitchen LORazepam (ATIVAN) 0.5 MG tablet TAKE ONE TABLET BY MOUTH TWICE A DAY AS NEEDED FOR ANXIETY 60 tablet 3  . Multiple Vitamin (MULTIVITAMIN WITH MINERALS) TABS tablet Take 1 tablet by mouth every evening.    . olmesartan (BENICAR) 20 MG tablet Take 0.5 tablets (10 mg total) by mouth every evening.    Vladimir Faster Glycol-Propyl Glycol (SYSTANE ULTRA) 0.4-0.3 % SOLN Place 1 drop into both eyes every evening.    . Ranibizumab (LUCENTIS IZ) 1 Dose by Intravitreal route every 28 (twenty-eight) days.     . Saw Palmetto 160 MG CAPS Take 160 mg by mouth every evening.     . vitamin C (ASCORBIC ACID) 500 MG tablet Take 500 mg by mouth every evening.     Marland Kitchen ZINC PICOLINATE PO Take 22 mg by mouth every evening.    . Teprotumumab-trbw (TEPEZZA) 500 MG SOLR Tepezza 500 mg intravenous solution  Inject 20 mg every 3 weeks by intravenous route for 8 days.     No facility-administered medications prior to visit.    ROS: Review of Systems  Constitutional: Negative for appetite change, fatigue and unexpected weight change.  HENT: Positive for congestion, hearing loss and tinnitus. Negative for nosebleeds, sneezing, sore throat and trouble swallowing.   Eyes: Positive for visual disturbance. Negative for itching.  Respiratory: Negative for cough.   Cardiovascular: Negative for chest pain,  palpitations and leg swelling.  Gastrointestinal: Negative for abdominal distention, blood in stool, diarrhea and nausea.  Genitourinary: Negative for frequency and hematuria.  Musculoskeletal: Negative for back pain, gait problem, joint swelling and neck pain.  Skin: Negative for rash.  Neurological: Negative for dizziness, tremors, speech difficulty and weakness.  Psychiatric/Behavioral: Negative for agitation, dysphoric mood and sleep disturbance. The patient is not nervous/anxious.     Objective:  BP 116/70   Pulse 64   Temp 97.9 F (36.6 C) (Oral)   Ht 6\' 2"  (1.88 m)   Wt 155 lb (70.3 kg)   SpO2 94%   BMI 19.90 kg/m   BP Readings from Last 3 Encounters:  06/20/20 116/70  05/16/20 118/68  04/14/20 (!) 152/83    Wt Readings from Last 3 Encounters:  06/20/20 155 lb (70.3 kg)  05/16/20 157 lb 6.4 oz (71.4 kg)  04/14/20 157 lb (71.2 kg)    Physical Exam Constitutional:      General: He is not in acute distress.    Appearance: He is well-developed.     Comments: NAD  HENT:     Right Ear: Tympanic membrane and ear canal normal.     Left Ear: Tympanic membrane and ear canal normal.     Mouth/Throat:     Mouth: Oropharynx is clear and moist.  Eyes:  Conjunctiva/sclera: Conjunctivae normal.     Pupils: Pupils are equal, round, and reactive to light.  Neck:     Thyroid: No thyromegaly.     Vascular: No JVD.  Cardiovascular:     Rate and Rhythm: Normal rate and regular rhythm.     Pulses: Intact distal pulses.     Heart sounds: Normal heart sounds. No murmur heard. No friction rub. No gallop.   Pulmonary:     Effort: Pulmonary effort is normal. No respiratory distress.     Breath sounds: Normal breath sounds. No wheezing or rales.  Chest:     Chest wall: No tenderness.  Abdominal:     General: Bowel sounds are normal. There is no distension.     Palpations: Abdomen is soft. There is no mass.     Tenderness: There is no abdominal tenderness. There is no  guarding or rebound.  Musculoskeletal:        General: No tenderness or edema. Normal range of motion.     Cervical back: Normal range of motion.  Lymphadenopathy:     Cervical: No cervical adenopathy.  Skin:    General: Skin is warm and dry.     Findings: No rash.  Neurological:     Mental Status: He is alert and oriented to person, place, and time.     Cranial Nerves: No cranial nerve deficit.     Motor: No abnormal muscle tone.     Coordination: He displays a negative Romberg sign. Coordination normal.     Gait: Gait normal.     Deep Tendon Reflexes: Reflexes are normal and symmetric.  Psychiatric:        Mood and Affect: Mood and affect normal.        Behavior: Behavior normal.        Thought Content: Thought content normal.        Judgment: Judgment normal.    R eye patch  Lab Results  Component Value Date   WBC 8.9 03/23/2020   HGB 12.8 (L) 03/23/2020   HCT 38.9 (L) 03/23/2020   PLT 237 03/23/2020   GLUCOSE 88 05/16/2020   CHOL 114 12/31/2018   TRIG 57.0 12/31/2018   HDL 53.30 12/31/2018   LDLCALC 49 12/31/2018   ALT 22 05/16/2020   AST 19 05/16/2020   NA 138 05/16/2020   K 4.1 05/16/2020   CL 102 05/16/2020   CREATININE 1.04 05/16/2020   BUN 23 05/16/2020   CO2 28 05/16/2020   TSH 3.11 05/16/2020   PSA 0.6 02/09/2020   INR 1.0 04/08/2020   HGBA1C 5.8 10/26/2010    No results found.  Assessment & Plan:    Walker Kehr, MD

## 2020-06-20 NOTE — Assessment & Plan Note (Signed)
Worse F/u w/Dr Constance Holster, ENT if not better Medrol pack Zpack

## 2020-06-20 NOTE — Assessment & Plan Note (Addendum)
Worse F/u w/Dr Constance Holster, ENT

## 2020-06-20 NOTE — Patient Instructions (Signed)
   B-complex with Niacin 100 mg    

## 2020-06-22 NOTE — Assessment & Plan Note (Signed)
David Buck was on [place] - he had 8 treatments - he had side  loss of taste, smell No change/improvement in his vision is observed so far

## 2020-06-22 NOTE — Assessment & Plan Note (Signed)
On L-arginine

## 2020-07-05 DIAGNOSIS — H9113 Presbycusis, bilateral: Secondary | ICD-10-CM | POA: Diagnosis not present

## 2020-07-05 DIAGNOSIS — H903 Sensorineural hearing loss, bilateral: Secondary | ICD-10-CM | POA: Diagnosis not present

## 2020-07-11 ENCOUNTER — Ambulatory Visit: Payer: Medicare Other | Admitting: Internal Medicine

## 2020-07-19 DIAGNOSIS — H442A2 Degenerative myopia with choroidal neovascularization, left eye: Secondary | ICD-10-CM | POA: Diagnosis not present

## 2020-07-19 DIAGNOSIS — H442E1 Degenerative myopia with other maculopathy, right eye: Secondary | ICD-10-CM | POA: Diagnosis not present

## 2020-07-19 DIAGNOSIS — H35373 Puckering of macula, bilateral: Secondary | ICD-10-CM | POA: Diagnosis not present

## 2020-07-19 DIAGNOSIS — H15833 Staphyloma posticum, bilateral: Secondary | ICD-10-CM | POA: Diagnosis not present

## 2020-07-28 DIAGNOSIS — H5022 Vertical strabismus, left eye: Secondary | ICD-10-CM | POA: Diagnosis not present

## 2020-07-28 DIAGNOSIS — E0500 Thyrotoxicosis with diffuse goiter without thyrotoxic crisis or storm: Secondary | ICD-10-CM | POA: Diagnosis not present

## 2020-08-10 DIAGNOSIS — L820 Inflamed seborrheic keratosis: Secondary | ICD-10-CM | POA: Diagnosis not present

## 2020-08-11 ENCOUNTER — Ambulatory Visit: Payer: Medicare Other | Admitting: Internal Medicine

## 2020-08-12 NOTE — Progress Notes (Signed)
Subjective:    Patient ID: David Buck, male    DOB: [birth year], 80 y.o.   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 Bronchoscopy (Dr Loanne Drilling) 04/14/20- BAL-Neg Fungal, Neg AFB, Pos Pseudomonas broadly sens. Had doxycycline prior to procedure, then Levaquin 500 mg x 7 days after. Cytology neg. 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 He resumed BASA x 1 day, saw a little more blood, and stopped BASA again with no more blood seen.  He denies other bleeding or bruising; Minimal cough, no adenopathy. Has felt a little "clammy at night once or twice without real sweats or chills. 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 This is now managed with occular injections and he reports being currently euthyroid.  He continues to smoke a few cigarettes daily. CTa chest 03/23/20-  IMPRESSION: 1. No evidence of significant pulmonary embolus. 2. Patchy ground-glass nodules, bronchial wall thickening, and peribronchial nodular infiltrates in the right upper and  right middle lung and minimally in both lung bases. Changes may represent early multifocal pneumonia, bronchopneumonia, or atypical pneumonia. Some similar changes were seen on the previous study but there is progression today. 3. Emphysema and aortic atherosclerosis. Aortic Atherosclerosis (ICD10-I70.0) and Emphysema (ICD10-J43.9).  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 Had doxycycline prior to procedure, then Levaquin 500 mg x 7 days after. Cytology neg. Covid vax-3 Moderna Flu vax-had ------Patient is feeling good overall, no concerns              Wife here Denies respiratory concerns and minimizes any cough. No sweat or nodes. Treatment for double vision blamed on thyroid, has not helped. Has had some postural vertigo.  Seeing ophthalmology and ENT for this. They have prevailed on him to stop smoking.  CXR 04/08/20- IMPRESSION: Patchy/nodular airspace opacity right upper lobe, compatible with findings on recent CT scan. No focal airspace consolidation, overt pulmonary edema, or pleural effusion.   ROS-see HPI  + = positive Constitutional:   No-   weight loss, night sweats, fevers, chills, fatigue, lassitude. HEENT:   No-  headaches, difficulty swallowing, tooth/dental problems, sore throat,       No-  sneezing, itching, ear ache, nasal congestion, +post nasal drip,  CV:  No-   chest pain, orthopnea, PND, swelling in lower extremities, anasarca, dizziness, palpitations Resp: No-   shortness of breath with exertion or at rest.             productive cough,  No- non-productive cough,  coughing up of blood.  No-   change in color of mucus.   No- wheezing.   Skin: No-   rash or lesions. GI:  No-   heartburn, indigestion, abdominal pain, nausea, vomiting,  GU:  MS:  No-   joint pain or swelling.  Neuro-     nothing unusual Psych:  No- change in mood or affect. No depression or anxiety.  No memory loss.  Objective:   Physical Exam General- Alert, Oriented, Affect-appropriate, Distress- none acute.+ tall slender man Skin- rash-none, lesions- none, excoriation- none Lymphadenopathy- none Head- atraumatic            Eyes- + patch over R eye.            Ears- Hearing, canals normal            Nose- Clear, No- Septal dev, +mucus postnasal drip, polyps, erosion, perforation             Throat- Mallampati II , mucosa clear , drainage- none, tonsils- atrophic  dentures Neck- flexible , trachea midline, no stridor , thyroid nl, carotid no bruit Chest - symmetrical excursion , unlabored           Heart/CV- RRR , no murmur , no gallop  , no rub, nl s1 s2                           - JVD- none , edema- none, stasis changes- none, varices- none           Lung- Clear, unlabored, wheeze- none, cough- none , dullness-none, rub- none           Chest wall-  Abd-  Br/ Gen/ Rectal- Not done, not indicated Extrem- cyanosis- none, clubbing, none, atrophy- none, strength- nl Neuro-+ mild head bobbing tremor    Assessment & Plan:

## 2020-08-15 ENCOUNTER — Ambulatory Visit (INDEPENDENT_AMBULATORY_CARE_PROVIDER_SITE_OTHER): Payer: Medicare Other | Admitting: Internal Medicine

## 2020-08-15 ENCOUNTER — Encounter: Payer: Self-pay | Admitting: Internal Medicine

## 2020-08-15 ENCOUNTER — Other Ambulatory Visit: Payer: Self-pay

## 2020-08-15 DIAGNOSIS — R911 Solitary pulmonary nodule: Secondary | ICD-10-CM | POA: Diagnosis not present

## 2020-08-15 DIAGNOSIS — J449 Chronic obstructive pulmonary disease, unspecified: Secondary | ICD-10-CM

## 2020-08-15 NOTE — Patient Instructions (Signed)
We can continue to watch over time, but no test or meds to offer for now. Please cal if we can help

## 2020-08-16 DIAGNOSIS — H43392 Other vitreous opacities, left eye: Secondary | ICD-10-CM | POA: Diagnosis not present

## 2020-08-16 DIAGNOSIS — H02531 Eyelid retraction right upper eyelid: Secondary | ICD-10-CM | POA: Diagnosis not present

## 2020-08-16 DIAGNOSIS — H532 Diplopia: Secondary | ICD-10-CM | POA: Diagnosis not present

## 2020-08-16 DIAGNOSIS — E063 Autoimmune thyroiditis: Secondary | ICD-10-CM | POA: Diagnosis not present

## 2020-08-16 NOTE — Assessment & Plan Note (Signed)
Stable Xray with RUL patchy nodular opacity. Clinically inactive. Plan skipping CXR this visit, but continue to watch long-term

## 2020-08-16 NOTE — Assessment & Plan Note (Signed)
He understands importance of smoking cessation He has mild obstruction and hasn't noticed benefit from inhalers

## 2020-09-12 DIAGNOSIS — H442A2 Degenerative myopia with choroidal neovascularization, left eye: Secondary | ICD-10-CM | POA: Diagnosis not present

## 2020-09-21 ENCOUNTER — Encounter: Payer: Self-pay | Admitting: Internal Medicine

## 2020-09-21 ENCOUNTER — Ambulatory Visit (INDEPENDENT_AMBULATORY_CARE_PROVIDER_SITE_OTHER): Payer: Medicare Other | Admitting: Internal Medicine

## 2020-09-21 ENCOUNTER — Other Ambulatory Visit: Payer: Self-pay

## 2020-09-21 DIAGNOSIS — R269 Unspecified abnormalities of gait and mobility: Secondary | ICD-10-CM | POA: Diagnosis not present

## 2020-09-21 DIAGNOSIS — I2510 Atherosclerotic heart disease of native coronary artery without angina pectoris: Secondary | ICD-10-CM

## 2020-09-21 DIAGNOSIS — I10 Essential (primary) hypertension: Secondary | ICD-10-CM

## 2020-09-21 DIAGNOSIS — I2583 Coronary atherosclerosis due to lipid rich plaque: Secondary | ICD-10-CM

## 2020-09-21 DIAGNOSIS — H539 Unspecified visual disturbance: Secondary | ICD-10-CM

## 2020-09-21 NOTE — Assessment & Plan Note (Addendum)
Low BP now. On Amlodipine, Benicar. Reduce and stop Amlodipine if BP is low Off Tapeza

## 2020-09-21 NOTE — Patient Instructions (Signed)
Reduce and stop Amlodipine if BP is low

## 2020-09-21 NOTE — Progress Notes (Signed)
Subjective:  Patient ID: David Buck, male    DOB: September 30, 1940  Age: 80 y.o. MRN: [id number]  CC: Follow-up   HPI David Buck presents for HTN - low BP now Off Tapezza - Dr Toy Cookey  Outpatient Medications Prior to Visit  Medication Sig Dispense Refill  . amLODipine (NORVASC) 5 MG tablet Take 5 mg by mouth daily.    . Cholecalciferol (VITAMIN D3) 50 MCG (2000 UT) TABS Take 2,000 Units by mouth every evening.    . loratadine (CLARITIN) 10 MG tablet Take 10 mg by mouth daily as needed for allergies.    Marland Kitchen LORazepam (ATIVAN) 0.5 MG tablet TAKE ONE TABLET BY MOUTH TWICE A DAY AS NEEDED FOR ANXIETY 60 tablet 3  . Multiple Vitamin (MULTIVITAMIN WITH MINERALS) TABS tablet Take 1 tablet by mouth every evening.    . olmesartan (BENICAR) 20 MG tablet Take 0.5 tablets (10 mg total) by mouth every evening.    Vladimir Faster Glycol-Propyl Glycol (SYSTANE ULTRA) 0.4-0.3 % SOLN Place 1 drop into both eyes every evening.    . Ranibizumab (LUCENTIS IZ) 1 Dose by Intravitreal route every 28 (twenty-eight) days.     . vitamin C (ASCORBIC ACID) 500 MG tablet Take 500 mg by mouth every evening.     Marland Kitchen ZINC PICOLINATE PO Take 22 mg by mouth every evening.     No facility-administered medications prior to visit.    ROS: Review of Systems  Constitutional: Negative for appetite change, fatigue and unexpected weight change.  HENT: Negative for congestion, nosebleeds, sneezing, sore throat and trouble swallowing.   Eyes: Positive for visual disturbance. Negative for itching.  Respiratory: Negative for cough.   Cardiovascular: Negative for chest pain, palpitations and leg swelling.  Gastrointestinal: Negative for abdominal distention, blood in stool, diarrhea and nausea.  Genitourinary: Negative for frequency and hematuria.  Musculoskeletal: Negative for back pain, gait problem, joint swelling and neck pain.  Skin: Negative for rash.  Neurological: Negative for dizziness, tremors, speech difficulty and  weakness.  Psychiatric/Behavioral: Negative for agitation, dysphoric mood and sleep disturbance. The patient is not nervous/anxious.     Objective:  BP 98/62 (BP Location: Left Arm)   Pulse 67   Temp 97.9 F (36.6 C) (Oral)   Ht 6\' 2"  (1.88 m)   Wt 156 lb 12.8 oz (71.1 kg)   SpO2 95%   BMI 20.13 kg/m   BP Readings from Last 3 Encounters:  09/21/20 98/62  08/15/20 102/60  06/20/20 116/70    Wt Readings from Last 3 Encounters:  09/21/20 156 lb 12.8 oz (71.1 kg)  08/15/20 156 lb 9.6 oz (71 kg)  06/20/20 155 lb (70.3 kg)    Physical Exam Constitutional:      General: He is not in acute distress.    Appearance: He is well-developed.     Comments: NAD  Eyes:     Conjunctiva/sclera: Conjunctivae normal.     Pupils: Pupils are equal, round, and reactive to light.  Neck:     Thyroid: No thyromegaly.     Vascular: No JVD.  Cardiovascular:     Rate and Rhythm: Normal rate and regular rhythm.     Heart sounds: Normal heart sounds. No murmur heard. No friction rub. No gallop.   Pulmonary:     Effort: Pulmonary effort is normal. No respiratory distress.     Breath sounds: Normal breath sounds. No wheezing or rales.  Chest:     Chest wall: No tenderness.  Abdominal:  General: Bowel sounds are normal. There is no distension.     Palpations: Abdomen is soft. There is no mass.     Tenderness: There is no abdominal tenderness. There is no guarding or rebound.  Musculoskeletal:        General: No tenderness. Normal range of motion.     Cervical back: Normal range of motion.  Lymphadenopathy:     Cervical: No cervical adenopathy.  Skin:    General: Skin is warm and dry.     Findings: No rash.  Neurological:     Mental Status: He is alert and oriented to person, place, and time.     Cranial Nerves: No cranial nerve deficit.     Motor: No abnormal muscle tone.     Coordination: Coordination normal.     Gait: Gait normal.     Deep Tendon Reflexes: Reflexes are normal and  symmetric.  Psychiatric:        Behavior: Behavior normal.        Thought Content: Thought content normal.        Judgment: Judgment normal.     Lab Results  Component Value Date   WBC 8.9 03/23/2020   HGB 12.8 (L) 03/23/2020   HCT 38.9 (L) 03/23/2020   PLT 237 03/23/2020   GLUCOSE 88 05/16/2020   CHOL 114 12/31/2018   TRIG 57.0 12/31/2018   HDL 53.30 12/31/2018   LDLCALC 49 12/31/2018   ALT 22 05/16/2020   AST 19 05/16/2020   NA 138 05/16/2020   K 4.1 05/16/2020   CL 102 05/16/2020   CREATININE 1.04 05/16/2020   BUN 23 05/16/2020   CO2 28 05/16/2020   TSH 3.11 05/16/2020   PSA 0.6 02/09/2020   INR 1.0 04/08/2020   HGBA1C 5.8 10/26/2010     Walker Kehr, MD

## 2020-09-25 NOTE — Assessment & Plan Note (Signed)
[name] is describing a lot of difficulties walking due to his vision/double vision.  We encouraged him to walk more

## 2020-09-25 NOTE — Assessment & Plan Note (Signed)
On lovastatin

## 2020-09-26 NOTE — Assessment & Plan Note (Signed)
David Buck is going to see Dr. Annamaria Boots

## 2020-10-10 DIAGNOSIS — H442A2 Degenerative myopia with choroidal neovascularization, left eye: Secondary | ICD-10-CM | POA: Diagnosis not present

## 2020-10-10 DIAGNOSIS — H442E1 Degenerative myopia with other maculopathy, right eye: Secondary | ICD-10-CM | POA: Diagnosis not present

## 2020-10-10 DIAGNOSIS — H35373 Puckering of macula, bilateral: Secondary | ICD-10-CM | POA: Diagnosis not present

## 2020-10-10 DIAGNOSIS — H15833 Staphyloma posticum, bilateral: Secondary | ICD-10-CM | POA: Diagnosis not present

## 2020-10-18 DIAGNOSIS — H532 Diplopia: Secondary | ICD-10-CM | POA: Diagnosis not present

## 2020-10-18 DIAGNOSIS — H40013 Open angle with borderline findings, low risk, bilateral: Secondary | ICD-10-CM | POA: Diagnosis not present

## 2020-10-18 DIAGNOSIS — E0500 Thyrotoxicosis with diffuse goiter without thyrotoxic crisis or storm: Secondary | ICD-10-CM | POA: Diagnosis not present

## 2020-11-25 DIAGNOSIS — H442A2 Degenerative myopia with choroidal neovascularization, left eye: Secondary | ICD-10-CM | POA: Diagnosis not present

## 2020-12-07 ENCOUNTER — Ambulatory Visit: Payer: Medicare Other | Admitting: Internal Medicine

## 2020-12-13 DIAGNOSIS — H43392 Other vitreous opacities, left eye: Secondary | ICD-10-CM | POA: Diagnosis not present

## 2020-12-13 DIAGNOSIS — H532 Diplopia: Secondary | ICD-10-CM | POA: Diagnosis not present

## 2020-12-13 DIAGNOSIS — E063 Autoimmune thyroiditis: Secondary | ICD-10-CM | POA: Diagnosis not present

## 2020-12-13 DIAGNOSIS — H02531 Eyelid retraction right upper eyelid: Secondary | ICD-10-CM | POA: Diagnosis not present

## 2020-12-14 ENCOUNTER — Ambulatory Visit: Payer: Medicare Other | Admitting: Internal Medicine

## 2020-12-15 ENCOUNTER — Ambulatory Visit (INDEPENDENT_AMBULATORY_CARE_PROVIDER_SITE_OTHER): Payer: Medicare Other | Admitting: Internal Medicine

## 2020-12-15 ENCOUNTER — Encounter: Payer: Self-pay | Admitting: Internal Medicine

## 2020-12-15 ENCOUNTER — Other Ambulatory Visit: Payer: Self-pay

## 2020-12-15 VITALS — BP 112/52 | HR 75 | Temp 98.8°F | Ht 74.0 in | Wt 158.2 lb

## 2020-12-15 DIAGNOSIS — R5382 Chronic fatigue, unspecified: Secondary | ICD-10-CM

## 2020-12-15 DIAGNOSIS — I10 Essential (primary) hypertension: Secondary | ICD-10-CM

## 2020-12-15 DIAGNOSIS — R251 Tremor, unspecified: Secondary | ICD-10-CM | POA: Diagnosis not present

## 2020-12-15 DIAGNOSIS — E034 Atrophy of thyroid (acquired): Secondary | ICD-10-CM

## 2020-12-15 MED ORDER — PRIMIDONE 50 MG PO TABS: 50.0000 mg | ORAL_TABLET | Freq: Three times a day (TID) | ORAL | 5 refills | Status: DC

## 2020-12-15 NOTE — Progress Notes (Signed)
Subjective:  Patient ID: David Buck, male    DOB: 1941/06/17  Age: 80 y.o. MRN: [id number]  CC: Tremors (Pt states he's been having increase tremors in his hands.Marland Kitchenand can't hear out of (R) ear )   HPI David Buck presents for tremor in the L hand (handed) David Buck got hearing aids - hard to put them in due to tremor  Outpatient Medications Prior to Visit  Medication Sig Dispense Refill   amLODipine (NORVASC) 5 MG tablet Take 5 mg by mouth daily.     Cholecalciferol (VITAMIN D3) 50 MCG (2000 UT) TABS Take 2,000 Units by mouth every evening.     Glucosamine Sulfate 500 MG TABS Take 1 by mouth daily     loratadine (CLARITIN) 10 MG tablet Take 10 mg by mouth daily as needed for allergies.     LORazepam (ATIVAN) 0.5 MG tablet TAKE ONE TABLET BY MOUTH TWICE A DAY AS NEEDED FOR ANXIETY 60 tablet 3   Multiple Vitamin (MULTIVITAMIN WITH MINERALS) TABS tablet Take 1 tablet by mouth every evening.     olmesartan (BENICAR) 20 MG tablet Take 0.5 tablets (10 mg total) by mouth every evening.     Polyethyl Glycol-Propyl Glycol (SYSTANE ULTRA) 0.4-0.3 % SOLN Place 1 drop into both eyes every evening.     Ranibizumab (LUCENTIS IZ) 1 Dose by Intravitreal route every 28 (twenty-eight) days.      vitamin C (ASCORBIC ACID) 500 MG tablet Take 500 mg by mouth every evening.      ZINC PICOLINATE PO Take 22 mg by mouth every evening.     No facility-administered medications prior to visit.    ROS: Review of Systems  Constitutional:  Positive for fatigue. Negative for appetite change and unexpected weight change.  HENT:  Negative for congestion, nosebleeds, sneezing, sore throat and trouble swallowing.   Eyes:  Negative for itching and visual disturbance.  Respiratory:  Negative for cough.   Cardiovascular:  Negative for chest pain, palpitations and leg swelling.  Gastrointestinal:  Negative for abdominal distention, blood in stool, diarrhea and nausea.  Genitourinary:  Negative for frequency and  hematuria.  Musculoskeletal:  Negative for back pain, gait problem, joint swelling and neck pain.  Skin:  Negative for rash.  Neurological:  Positive for tremors. Negative for dizziness, speech difficulty and weakness.  Psychiatric/Behavioral:  Negative for agitation, dysphoric mood, sleep disturbance and suicidal ideas. The patient is not nervous/anxious.    Objective:  BP (!) 112/52 (BP Location: Left Arm)   Pulse 75   Temp 98.8 F (37.1 C) (Oral)   Ht 6\' 2"  (1.88 m)   Wt 158 lb 3.2 oz (71.8 kg)   SpO2 95%   BMI 20.31 kg/m   BP Readings from Last 3 Encounters:  12/15/20 (!) 112/52  09/21/20 98/62  08/15/20 102/60    Wt Readings from Last 3 Encounters:  12/15/20 158 lb 3.2 oz (71.8 kg)  09/21/20 156 lb 12.8 oz (71.1 kg)  08/15/20 156 lb 9.6 oz (71 kg)    Physical Exam Constitutional:      General: He is not in acute distress.    Appearance: He is well-developed.     Comments: NAD  Eyes:     Conjunctiva/sclera: Conjunctivae normal.     Pupils: Pupils are equal, round, and reactive to light.  Neck:     Thyroid: No thyromegaly.     Vascular: No JVD.  Cardiovascular:     Rate and Rhythm: Normal rate and regular rhythm.  Heart sounds: Normal heart sounds. No murmur heard.   No friction rub. No gallop.  Pulmonary:     Effort: Pulmonary effort is normal. No respiratory distress.     Breath sounds: Normal breath sounds. No wheezing or rales.  Chest:     Chest wall: No tenderness.  Abdominal:     General: Bowel sounds are normal. There is no distension.     Palpations: Abdomen is soft. There is no mass.     Tenderness: There is no abdominal tenderness. There is no guarding or rebound.  Musculoskeletal:        General: No tenderness. Normal range of motion.     Cervical back: Normal range of motion.  Lymphadenopathy:     Cervical: No cervical adenopathy.  Skin:    General: Skin is warm and dry.     Findings: No rash.  Neurological:     Mental Status: He is  alert and oriented to person, place, and time.     Cranial Nerves: No cranial nerve deficit.     Motor: No abnormal muscle tone.     Coordination: Coordination normal.     Gait: Gait normal.     Deep Tendon Reflexes: Reflexes are normal and symmetric.  Psychiatric:        Behavior: Behavior normal.        Thought Content: Thought content normal.        Judgment: Judgment normal.   Minimal tremor is present in both hands, hardly noticeable at the moment  Lab Results  Component Value Date   WBC 8.9 03/23/2020   HGB 12.8 (L) 03/23/2020   HCT 38.9 (L) 03/23/2020   PLT 237 03/23/2020   GLUCOSE 88 05/16/2020   CHOL 114 12/31/2018   TRIG 57.0 12/31/2018   HDL 53.30 12/31/2018   LDLCALC 49 12/31/2018   ALT 22 05/16/2020   AST 19 05/16/2020   NA 138 05/16/2020   K 4.1 05/16/2020   CL 102 05/16/2020   CREATININE 1.04 05/16/2020   BUN 23 05/16/2020   CO2 28 05/16/2020   TSH 3.11 05/16/2020   PSA 0.6 02/09/2020   INR 1.0 04/08/2020   HGBA1C 5.8 10/26/2010    No results found.  Assessment & Plan:     Follow-up: No follow-ups on file.  Walker Kehr, MD

## 2020-12-16 LAB — CBC WITH DIFFERENTIAL/PLATELET
Basophils Absolute: 0.1 10*3/uL (ref 0.0–0.1)
Basophils Relative: 1.1 % (ref 0.0–3.0)
Eosinophils Absolute: 0.6 10*3/uL (ref 0.0–0.7)
Eosinophils Relative: 8.1 % — ABNORMAL HIGH (ref 0.0–5.0)
HCT: 36 % — ABNORMAL LOW (ref 39.0–52.0)
Hemoglobin: 12 g/dL — ABNORMAL LOW (ref 13.0–17.0)
Lymphocytes Relative: 21.1 % (ref 12.0–46.0)
Lymphs Abs: 1.5 10*3/uL (ref 0.7–4.0)
MCHC: 33.4 g/dL (ref 30.0–36.0)
MCV: 95.1 fl (ref 78.0–100.0)
Monocytes Absolute: 0.6 10*3/uL (ref 0.1–1.0)
Monocytes Relative: 8.8 % (ref 3.0–12.0)
Neutro Abs: 4.3 10*3/uL (ref 1.4–7.7)
Neutrophils Relative %: 60.9 % (ref 43.0–77.0)
Platelets: 275 10*3/uL (ref 150.0–400.0)
RBC: 3.79 Mil/uL — ABNORMAL LOW (ref 4.22–5.81)
RDW: 13 % (ref 11.5–15.5)
WBC: 7.1 10*3/uL (ref 4.0–10.5)

## 2020-12-16 LAB — COMPREHENSIVE METABOLIC PANEL
ALT: 22 U/L (ref 0–53)
AST: 23 U/L (ref 0–37)
Albumin: 4.2 g/dL (ref 3.5–5.2)
Alkaline Phosphatase: 69 U/L (ref 39–117)
BUN: 25 mg/dL — ABNORMAL HIGH (ref 6–23)
CO2: 27 mEq/L (ref 19–32)
Calcium: 10.1 mg/dL (ref 8.4–10.5)
Chloride: 104 mEq/L (ref 96–112)
Creatinine, Ser: 0.97 mg/dL (ref 0.40–1.50)
GFR: 74.57 mL/min (ref >60.00)
Glucose, Bld: 78 mg/dL (ref 70–99)
Potassium: 4.3 mEq/L (ref 3.5–5.1)
Sodium: 139 mEq/L (ref 135–145)
Total Bilirubin: 0.5 mg/dL (ref 0.2–1.2)
Total Protein: 7 g/dL (ref 6.0–8.3)

## 2020-12-16 LAB — TSH: TSH: 3.02 u[IU]/mL (ref 0.35–4.50)

## 2020-12-16 LAB — T4, FREE: Free T4: 0.86 ng/dL (ref 0.60–1.60)

## 2020-12-16 LAB — T3, FREE: T3, Free: 3.4 pg/mL (ref 2.3–4.2)

## 2020-12-19 DIAGNOSIS — R251 Tremor, unspecified: Secondary | ICD-10-CM | POA: Insufficient documentation

## 2020-12-19 NOTE — Assessment & Plan Note (Signed)
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  Discussed.  Obtain lab work including thyroid tests.  Empiric primidone at low-dose prescribed.

## 2020-12-19 NOTE — Assessment & Plan Note (Signed)
We encouraged David Buck to start exercising

## 2020-12-23 DIAGNOSIS — H442A2 Degenerative myopia with choroidal neovascularization, left eye: Secondary | ICD-10-CM | POA: Diagnosis not present

## 2020-12-23 DIAGNOSIS — H35373 Puckering of macula, bilateral: Secondary | ICD-10-CM | POA: Diagnosis not present

## 2021-01-20 DIAGNOSIS — H35373 Puckering of macula, bilateral: Secondary | ICD-10-CM | POA: Diagnosis not present

## 2021-01-20 DIAGNOSIS — H442A2 Degenerative myopia with choroidal neovascularization, left eye: Secondary | ICD-10-CM | POA: Diagnosis not present

## 2021-01-23 ENCOUNTER — Ambulatory Visit: Payer: Medicare Other | Admitting: Internal Medicine

## 2021-01-24 DIAGNOSIS — Z20822 Contact with and (suspected) exposure to covid-19: Secondary | ICD-10-CM | POA: Diagnosis not present

## 2021-02-15 NOTE — Progress Notes (Signed)
Subjective:    Patient ID: David Buck, male    DOB: Jun 24, 1941, 80 y.o.   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 Bronchoscopy (Dr Loanne Drilling) 04/14/20- BAL-Neg Fungal, Neg AFB, Pos Pseudomonas broadly sens. Had doxycycline prior to procedure, then Levaquin 500 mg x 7 days after. Cytology neg.   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 Had doxycycline prior to procedure, then Levaquin 500 mg x 7 days after. Cytology neg. Covid vax-3 Moderna Flu vax-had ------Patient is feeling good overall, no concerns              Wife here Denies respiratory concerns and minimizes any cough. No sweat or nodes. Treatment for double vision blamed on thyroid, has not helped. Has had some postural vertigo.  Seeing ophthalmology and ENT for this. They have prevailed on him to stop smoking.  CXR 04/08/20- IMPRESSION: Patchy/nodular airspace opacity right upper lobe, compatible with findings on recent CT scan. No focal airspace consolidation, overt pulmonary edema, or pleural effusion.  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 Intermittent R nostril/ ear congested. Using Afrin/ Sudafed, Flonase, nasal saline rinse. - These discussed and compared.  Little cough now. Imaging discussed.  ROS-see HPI  + = positive Constitutional:   No-   weight loss, night sweats, fevers, chills, fatigue, lassitude. HEENT:   No-  headaches, difficulty swallowing, tooth/dental problems, sore throat,       No-  sneezing, itching, ear ache, +nasal congestion, +post nasal drip,  CV:  No-   chest pain, orthopnea, PND, swelling in lower extremities, anasarca, dizziness, palpitations Resp: No-   shortness of breath with exertion or at rest.             productive cough,  No- non-productive cough,  coughing up of blood.              No-   change in color of mucus.  No- wheezing.   Skin: No-   rash or lesions. GI:  No-   heartburn, indigestion, abdominal pain, nausea, vomiting,  GU:  MS:  No-   joint pain or swelling.  Neuro-     nothing unusual Psych:  No- change in mood or affect. No depression or anxiety.  No memory loss.  Objective:   Physical Exam General- Alert, Oriented, Affect-appropriate, Distress- none acute.+ tall slender man  Skin- rash-none, lesions- none, excoriation- none Lymphadenopathy- none Head- atraumatic            Eyes- + patch over R eye.            Ears- Hearing, canals normal            Nose- Clear, No- Septal dev, +mucus postnasal drip, polyps, erosion, perforation             Throat- Mallampati II , mucosa clear , drainage- none, tonsils- atrophic  dentures Neck- flexible , trachea midline, no stridor , thyroid nl, carotid no bruit Chest - symmetrical excursion ,  unlabored           Heart/CV- RRR , no murmur , no gallop  , no rub, nl s1 s2                           - JVD- none , edema- none, stasis changes- none, varices- none           Lung- Clear, unlabored, wheeze- none, cough+slight , dullness-none, rub- none           Chest wall-  Abd-  Br/ Gen/ Rectal- Not done, not indicated Extrem- cyanosis- none, clubbing, none, atrophy- none, strength- nl Neuro-+ mild head bobbing tremor    Assessment & Plan:

## 2021-02-16 ENCOUNTER — Encounter: Payer: Self-pay | Admitting: Internal Medicine

## 2021-02-16 ENCOUNTER — Ambulatory Visit (INDEPENDENT_AMBULATORY_CARE_PROVIDER_SITE_OTHER): Payer: Medicare Other

## 2021-02-16 ENCOUNTER — Other Ambulatory Visit: Payer: Self-pay

## 2021-02-16 ENCOUNTER — Ambulatory Visit (INDEPENDENT_AMBULATORY_CARE_PROVIDER_SITE_OTHER): Payer: Medicare Other | Admitting: Internal Medicine

## 2021-02-16 VITALS — BP 97/73 | HR 73 | Temp 98.0°F | Wt 159.6 lb

## 2021-02-16 DIAGNOSIS — R911 Solitary pulmonary nodule: Secondary | ICD-10-CM

## 2021-02-16 DIAGNOSIS — J449 Chronic obstructive pulmonary disease, unspecified: Secondary | ICD-10-CM

## 2021-02-16 NOTE — Patient Instructions (Signed)
Order- CXR    dx lung nodules  For your sinus/ ear- when needed Decongestant to improve drainage- either Afrin nasal spray, sparingly, once daily intermittently, or Sudafed pill used early in day so it won't keep you awake.  Nasal saline irrigation- once daily   Flonase nasal steroid spray (antiinflammatory)   1-2 puffs each nostril once daily every day while needed  Please call as needed

## 2021-02-17 DIAGNOSIS — H35373 Puckering of macula, bilateral: Secondary | ICD-10-CM | POA: Diagnosis not present

## 2021-02-17 DIAGNOSIS — H442E1 Degenerative myopia with other maculopathy, right eye: Secondary | ICD-10-CM | POA: Diagnosis not present

## 2021-02-17 DIAGNOSIS — H442A2 Degenerative myopia with choroidal neovascularization, left eye: Secondary | ICD-10-CM | POA: Diagnosis not present

## 2021-02-17 DIAGNOSIS — H15833 Staphyloma posticum, bilateral: Secondary | ICD-10-CM | POA: Diagnosis not present

## 2021-02-17 NOTE — Progress Notes (Signed)
Called and left detailed msg on machine with results ok per DPR.

## 2021-02-19 ENCOUNTER — Encounter: Payer: Self-pay | Admitting: Internal Medicine

## 2021-02-19 NOTE — Assessment & Plan Note (Signed)
Long smoking hx. Fortunately he has quit. Plan- CXR

## 2021-02-19 NOTE — Assessment & Plan Note (Signed)
Small nodules have been stable, acting benign. Plan- CXR

## 2021-03-07 ENCOUNTER — Other Ambulatory Visit: Payer: Self-pay

## 2021-03-07 ENCOUNTER — Encounter: Payer: Self-pay | Admitting: Internal Medicine

## 2021-03-07 ENCOUNTER — Ambulatory Visit (INDEPENDENT_AMBULATORY_CARE_PROVIDER_SITE_OTHER): Payer: Medicare Other | Admitting: Internal Medicine

## 2021-03-07 VITALS — BP 98/62 | HR 68 | Temp 98.1°F | Ht 74.0 in | Wt 161.0 lb

## 2021-03-07 DIAGNOSIS — H539 Unspecified visual disturbance: Secondary | ICD-10-CM

## 2021-03-07 DIAGNOSIS — I10 Essential (primary) hypertension: Secondary | ICD-10-CM

## 2021-03-07 DIAGNOSIS — R911 Solitary pulmonary nodule: Secondary | ICD-10-CM

## 2021-03-07 DIAGNOSIS — R251 Tremor, unspecified: Secondary | ICD-10-CM

## 2021-03-07 DIAGNOSIS — E034 Atrophy of thyroid (acquired): Secondary | ICD-10-CM

## 2021-03-07 NOTE — Progress Notes (Signed)
Subjective:  Patient ID: David Buck, male    DOB: 1940-08-19  Age: 80 y.o. MRN: [id number]  CC: Follow-up (4 MONTH F/U)   HPI David Buck presents for a thyroid disease, HTN, tremors, strabismus -David Buck will see Dr Posey Pronto   Outpatient Medications Prior to Visit  Medication Sig Dispense Refill   Cholecalciferol (VITAMIN D3) 50 MCG (2000 UT) TABS Take 2,000 Units by mouth every evening.     Glucosamine Sulfate 500 MG TABS Take 1 by mouth daily     loratadine (CLARITIN) 10 MG tablet Take 10 mg by mouth daily as needed for allergies.     Multiple Vitamin (MULTIVITAMIN WITH MINERALS) TABS tablet Take 1 tablet by mouth every evening.     olmesartan (BENICAR) 20 MG tablet Take 0.5 tablets (10 mg total) by mouth every evening.     Polyethyl Glycol-Propyl Glycol (SYSTANE ULTRA) 0.4-0.3 % SOLN Place 1 drop into both eyes every evening.     Ranibizumab (LUCENTIS IZ) 1 Dose by Intravitreal route every 28 (twenty-eight) days.      vitamin C (ASCORBIC ACID) 500 MG tablet Take 500 mg by mouth every evening.      ZINC PICOLINATE PO Take 22 mg by mouth every evening.     LORazepam (ATIVAN) 0.5 MG tablet TAKE ONE TABLET BY MOUTH TWICE A DAY AS NEEDED FOR ANXIETY (Patient not taking: Reported on 03/07/2021) 60 tablet 3   amLODipine (NORVASC) 5 MG tablet Take 5 mg by mouth daily.     No facility-administered medications prior to visit.    ROS: Review of Systems  Constitutional:  Negative for appetite change, fatigue and unexpected weight change.  HENT:  Negative for congestion, nosebleeds, sneezing, sore throat and trouble swallowing.   Eyes:  Negative for itching and visual disturbance.  Respiratory:  Negative for cough.   Cardiovascular:  Negative for chest pain, palpitations and leg swelling.  Gastrointestinal:  Negative for abdominal distention, blood in stool, diarrhea and nausea.  Genitourinary:  Negative for frequency and hematuria.  Musculoskeletal:  Negative for back pain, gait  problem, joint swelling and neck pain.  Skin:  Negative for rash.  Neurological:  Positive for tremors. Negative for dizziness, speech difficulty and weakness.  Psychiatric/Behavioral:  Negative for agitation, dysphoric mood, sleep disturbance and suicidal ideas. The patient is not nervous/anxious.    Objective:  BP 98/62 (BP Location: Left Arm)   Pulse 68   Temp 98.1 F (36.7 C) (Oral)   Ht 6\' 2"  (1.88 m)   Wt 161 lb (73 kg)   SpO2 96%   BMI 20.67 kg/m   BP Readings from Last 3 Encounters:  03/07/21 98/62  02/16/21 97/73  12/15/20 (!) 112/52    Wt Readings from Last 3 Encounters:  03/07/21 161 lb (73 kg)  02/16/21 159 lb 9.6 oz (72.4 kg)  12/15/20 158 lb 3.2 oz (71.8 kg)    Physical Exam Constitutional:      General: He is not in acute distress.    Appearance: He is well-developed.     Comments: NAD  Eyes:     Conjunctiva/sclera: Conjunctivae normal.     Pupils: Pupils are equal, round, and reactive to light.  Neck:     Thyroid: No thyromegaly.     Vascular: No JVD.  Cardiovascular:     Rate and Rhythm: Normal rate and regular rhythm.     Heart sounds: Normal heart sounds. No murmur heard.   No friction rub. No gallop.  Pulmonary:  Effort: Pulmonary effort is normal. No respiratory distress.     Breath sounds: Normal breath sounds. No wheezing or rales.  Chest:     Chest wall: No tenderness.  Abdominal:     General: Bowel sounds are normal. There is no distension.     Palpations: Abdomen is soft. There is no mass.     Tenderness: There is no abdominal tenderness. There is no guarding or rebound.  Musculoskeletal:        General: No tenderness. Normal range of motion.     Cervical back: Normal range of motion.  Lymphadenopathy:     Cervical: No cervical adenopathy.  Skin:    General: Skin is warm and dry.     Findings: No rash.  Neurological:     Mental Status: He is alert and oriented to person, place, and time.     Cranial Nerves: No cranial nerve  deficit.     Motor: No abnormal muscle tone.     Coordination: Coordination normal.     Gait: Gait normal.     Deep Tendon Reflexes: Reflexes are normal and symmetric.  Psychiatric:        Behavior: Behavior normal.        Thought Content: Thought content normal.        Judgment: Judgment normal.   Left eye is patched Lab Results  Component Value Date   WBC 7.1 12/15/2020   HGB 12.0 (L) 12/15/2020   HCT 36.0 (L) 12/15/2020   PLT 275.0 12/15/2020   GLUCOSE 78 12/15/2020   CHOL 114 12/31/2018   TRIG 57.0 12/31/2018   HDL 53.30 12/31/2018   LDLCALC 49 12/31/2018   ALT 22 12/15/2020   AST 23 12/15/2020   NA 139 12/15/2020   K 4.3 12/15/2020   CL 104 12/15/2020   CREATININE 0.97 12/15/2020   BUN 25 (H) 12/15/2020   CO2 27 12/15/2020   TSH 3.02 12/15/2020   PSA 0.6 02/09/2020   INR 1.0 04/08/2020   HGBA1C 5.8 10/26/2010    No results found.  Assessment & Plan:     Walker Kehr, MD

## 2021-03-07 NOTE — Assessment & Plan Note (Signed)
F/u w/Dr Young 

## 2021-03-07 NOTE — Assessment & Plan Note (Signed)
Continue with Benicar.  Obtain c-Met

## 2021-03-07 NOTE — Assessment & Plan Note (Signed)
Unchanged.  David Buck has an appointment to see Dr. Posey Pronto

## 2021-03-07 NOTE — Assessment & Plan Note (Signed)
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  Primidone caused side effects.  It was stopped. [name] can use lorazepam as needed for days with better tremor

## 2021-03-07 NOTE — Assessment & Plan Note (Signed)
We will obtain TSH, free T4, free T3 Continue with Levothroid

## 2021-03-08 ENCOUNTER — Other Ambulatory Visit: Payer: Self-pay | Admitting: Internal Medicine

## 2021-03-17 DIAGNOSIS — H35373 Puckering of macula, bilateral: Secondary | ICD-10-CM | POA: Diagnosis not present

## 2021-03-17 DIAGNOSIS — H442A2 Degenerative myopia with choroidal neovascularization, left eye: Secondary | ICD-10-CM | POA: Diagnosis not present

## 2021-03-21 DIAGNOSIS — H50011 Monocular esotropia, right eye: Secondary | ICD-10-CM | POA: Diagnosis not present

## 2021-03-21 DIAGNOSIS — H4423 Degenerative myopia, bilateral: Secondary | ICD-10-CM | POA: Diagnosis not present

## 2021-03-21 DIAGNOSIS — H532 Diplopia: Secondary | ICD-10-CM | POA: Diagnosis not present

## 2021-03-21 DIAGNOSIS — Z961 Presence of intraocular lens: Secondary | ICD-10-CM | POA: Diagnosis not present

## 2021-03-21 DIAGNOSIS — H5021 Vertical strabismus, right eye: Secondary | ICD-10-CM | POA: Diagnosis not present

## 2021-04-04 DIAGNOSIS — L821 Other seborrheic keratosis: Secondary | ICD-10-CM | POA: Diagnosis not present

## 2021-04-04 DIAGNOSIS — D1801 Hemangioma of skin and subcutaneous tissue: Secondary | ICD-10-CM | POA: Diagnosis not present

## 2021-04-04 DIAGNOSIS — L738 Other specified follicular disorders: Secondary | ICD-10-CM | POA: Diagnosis not present

## 2021-04-04 DIAGNOSIS — L570 Actinic keratosis: Secondary | ICD-10-CM | POA: Diagnosis not present

## 2021-04-11 DIAGNOSIS — H532 Diplopia: Secondary | ICD-10-CM | POA: Diagnosis not present

## 2021-04-11 DIAGNOSIS — H5021 Vertical strabismus, right eye: Secondary | ICD-10-CM | POA: Diagnosis not present

## 2021-04-11 DIAGNOSIS — H50311 Intermittent monocular esotropia, right eye: Secondary | ICD-10-CM | POA: Diagnosis not present

## 2021-04-12 ENCOUNTER — Encounter (HOSPITAL_BASED_OUTPATIENT_CLINIC_OR_DEPARTMENT_OTHER): Payer: Self-pay | Admitting: Ophthalmology

## 2021-04-12 ENCOUNTER — Other Ambulatory Visit: Payer: Self-pay

## 2021-04-12 DIAGNOSIS — Z23 Encounter for immunization: Secondary | ICD-10-CM | POA: Diagnosis not present

## 2021-04-14 ENCOUNTER — Encounter (HOSPITAL_BASED_OUTPATIENT_CLINIC_OR_DEPARTMENT_OTHER)
Admission: RE | Admit: 2021-04-14 | Discharge: 2021-04-14 | Disposition: A | Discharge status: Home / Self Care | Payer: Medicare Other | Source: Ambulatory Visit | Attending: Ophthalmology | Admitting: Ophthalmology

## 2021-04-14 DIAGNOSIS — H442E1 Degenerative myopia with other maculopathy, right eye: Secondary | ICD-10-CM | POA: Diagnosis not present

## 2021-04-14 DIAGNOSIS — H35373 Puckering of macula, bilateral: Secondary | ICD-10-CM | POA: Diagnosis not present

## 2021-04-14 DIAGNOSIS — H442A2 Degenerative myopia with choroidal neovascularization, left eye: Secondary | ICD-10-CM | POA: Diagnosis not present

## 2021-04-14 DIAGNOSIS — Z01810 Encounter for preprocedural cardiovascular examination: Secondary | ICD-10-CM | POA: Diagnosis not present

## 2021-04-17 ENCOUNTER — Ambulatory Visit: Payer: Self-pay | Admitting: Ophthalmology

## 2021-04-17 NOTE — H&P (View-Only) (Signed)
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  Pertinent past medical history:  Past Medical History:  Diagnosis Date   Allergy    rhinitis   COPD (chronic obstructive pulmonary disease) ([place])    History of hepatitis B    recovered   Hypertension    Hypothyroidism    LAFB (left anterior fascicular block) 2008   on EKG   Meniere's disease    Osteoarthritis of right knee    Dr Percell Miller   Pneumonia    hx of 2/17    Posterior vitreous detachment 2011   R, body- Dr Baird Cancer   Prostatitis    Tinnitus     Pertinent ocular history:  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 Additional affected muscle include the right medial rectus muscle. Patient completed a course of Tepezza under the direction of Dr. Ramonita Lab; he does not require oculoplastic alteration of his orbit and has been referred for strabismic repair. History of cataract extraction OU with Dr. Quentin Ore; excellent results and vision. 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  Pertinent family history:  Family History  Problem Relation Age of Onset   Cancer Mother        ovarian and bowel   Other Father        cerebral hemorrage   Hypertension Other     General:  Healthy appearing patient in no distress. Eye patch in place over the right eye for blocking of diplopia.  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 Patient aware that more than one procedure likely necessary to achieve binocular vision; that metamorphopsia will prevent best vision even with perfect alignment; and that TED can recur without warning at any time, regardless of surgical history or site, requiring further surgery.  Thomes Cake, MD

## 2021-04-17 NOTE — H&P (Signed)
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  Pertinent past medical history:  Past Medical History:  Diagnosis Date   Allergy    rhinitis   COPD (chronic obstructive pulmonary disease) ([place])    History of hepatitis B    recovered   Hypertension    Hypothyroidism    LAFB (left anterior fascicular block) 2008   on EKG   Meniere's disease    Osteoarthritis of right knee    Dr Percell Miller   Pneumonia    hx of 2/17    Posterior vitreous detachment 2011   R, body- Dr Baird Cancer   Prostatitis    Tinnitus     Pertinent ocular history:  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 Additional affected muscle include the right medial rectus muscle. Patient completed a course of Tepezza under the direction of Dr. Ramonita Lab; he does not require oculoplastic alteration of his orbit and has been referred for strabismic repair. History of cataract extraction OU with Dr. Quentin Ore; excellent results and vision. 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  Pertinent family history:  Family History  Problem Relation Age of Onset   Cancer Mother        ovarian and bowel   Other Father        cerebral hemorrage   Hypertension Other     General:  Healthy appearing patient in no distress. Eye patch in place over the right eye for blocking of diplopia.  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 Patient aware that more than one procedure likely necessary to achieve binocular vision; that metamorphopsia will prevent best vision even with perfect alignment; and that TED can recur without warning at any time, regardless of surgical history or site, requiring further surgery.  Thomes Cake, MD

## 2021-04-19 DIAGNOSIS — H532 Diplopia: Secondary | ICD-10-CM | POA: Diagnosis not present

## 2021-04-19 DIAGNOSIS — H40013 Open angle with borderline findings, low risk, bilateral: Secondary | ICD-10-CM | POA: Diagnosis not present

## 2021-04-19 DIAGNOSIS — E0500 Thyrotoxicosis with diffuse goiter without thyrotoxic crisis or storm: Secondary | ICD-10-CM | POA: Diagnosis not present

## 2021-04-19 DIAGNOSIS — H353131 Nonexudative age-related macular degeneration, bilateral, early dry stage: Secondary | ICD-10-CM | POA: Diagnosis not present

## 2021-04-19 LAB — HM DIABETES EYE EXAM

## 2021-04-20 ENCOUNTER — Ambulatory Visit (HOSPITAL_BASED_OUTPATIENT_CLINIC_OR_DEPARTMENT_OTHER): Payer: Medicare Other | Admitting: Certified Registered"

## 2021-04-20 ENCOUNTER — Ambulatory Visit (HOSPITAL_BASED_OUTPATIENT_CLINIC_OR_DEPARTMENT_OTHER)
Admission: RE | Admit: 2021-04-20 | Discharge: 2021-04-20 | Disposition: A | Discharge status: Home / Self Care | Payer: Medicare Other | Attending: Ophthalmology | Admitting: Ophthalmology

## 2021-04-20 ENCOUNTER — Other Ambulatory Visit: Payer: Self-pay

## 2021-04-20 ENCOUNTER — Encounter (HOSPITAL_BASED_OUTPATIENT_CLINIC_OR_DEPARTMENT_OTHER): Payer: Self-pay | Admitting: Ophthalmology

## 2021-04-20 ENCOUNTER — Encounter (HOSPITAL_BASED_OUTPATIENT_CLINIC_OR_DEPARTMENT_OTHER)
Admission: RE | Disposition: A | Discharge status: Home / Self Care | Payer: Self-pay | Source: Home / Self Care | Attending: Ophthalmology

## 2021-04-20 DIAGNOSIS — H3530 Unspecified macular degeneration: Secondary | ICD-10-CM | POA: Diagnosis not present

## 2021-04-20 DIAGNOSIS — J449 Chronic obstructive pulmonary disease, unspecified: Secondary | ICD-10-CM | POA: Insufficient documentation

## 2021-04-20 DIAGNOSIS — H532 Diplopia: Secondary | ICD-10-CM | POA: Diagnosis not present

## 2021-04-20 DIAGNOSIS — I10 Essential (primary) hypertension: Secondary | ICD-10-CM | POA: Diagnosis not present

## 2021-04-20 DIAGNOSIS — H5000 Unspecified esotropia: Secondary | ICD-10-CM | POA: Insufficient documentation

## 2021-04-20 DIAGNOSIS — E039 Hypothyroidism, unspecified: Secondary | ICD-10-CM | POA: Insufficient documentation

## 2021-04-20 DIAGNOSIS — H8109 Meniere's disease, unspecified ear: Secondary | ICD-10-CM | POA: Diagnosis not present

## 2021-04-20 DIAGNOSIS — I2510 Atherosclerotic heart disease of native coronary artery without angina pectoris: Secondary | ICD-10-CM | POA: Diagnosis not present

## 2021-04-20 DIAGNOSIS — Z87891 Personal history of nicotine dependence: Secondary | ICD-10-CM | POA: Insufficient documentation

## 2021-04-20 DIAGNOSIS — I739 Peripheral vascular disease, unspecified: Secondary | ICD-10-CM | POA: Insufficient documentation

## 2021-04-20 DIAGNOSIS — H5021 Vertical strabismus, right eye: Secondary | ICD-10-CM | POA: Diagnosis not present

## 2021-04-20 DIAGNOSIS — I444 Left anterior fascicular block: Secondary | ICD-10-CM | POA: Insufficient documentation

## 2021-04-20 DIAGNOSIS — H50011 Monocular esotropia, right eye: Secondary | ICD-10-CM | POA: Diagnosis not present

## 2021-04-20 DIAGNOSIS — H353220 Exudative age-related macular degeneration, left eye, stage unspecified: Secondary | ICD-10-CM | POA: Diagnosis not present

## 2021-04-20 HISTORY — PX: STRABISMUS SURGERY: SHX218

## 2021-04-20 SURGERY — REPAIR STRABISMUS
Anesthesia: General | Site: Eye | Laterality: Right

## 2021-04-20 MED ORDER — PROPOFOL 10 MG/ML IV BOLUS
INTRAVENOUS | Status: DC | PRN
Start: 2021-04-20 — End: 2021-04-20
  Administered 2021-04-20: 20 mg via INTRAVENOUS
  Administered 2021-04-20: 200 mg via INTRAVENOUS
  Administered 2021-04-20: 20 mg via INTRAVENOUS

## 2021-04-20 MED ORDER — HYDROMORPHONE HCL 1 MG/ML IJ SOLN: 0.2500 mg | INTRAMUSCULAR | Status: DC | PRN

## 2021-04-20 MED ORDER — PHENYLEPHRINE HCL 10 % OP SOLN
1.0000 [drp] | Freq: Once | OPHTHALMIC | Status: AC
  Administered 2021-04-20: 1 [drp] via OPHTHALMIC

## 2021-04-20 MED ORDER — KETOROLAC TROMETHAMINE 30 MG/ML IJ SOLN
INTRAMUSCULAR | Status: DC | PRN
  Administered 2021-04-20: 15 mg via INTRAVENOUS

## 2021-04-20 MED ORDER — PHENYLEPHRINE HCL (PRESSORS) 10 MG/ML IV SOLN
INTRAVENOUS | Status: DC | PRN
  Administered 2021-04-20: 40 ug via INTRAVENOUS
  Administered 2021-04-20: 80 ug via INTRAVENOUS
  Administered 2021-04-20: 40 ug via INTRAVENOUS
  Administered 2021-04-20: 160 ug via INTRAVENOUS

## 2021-04-20 MED ORDER — FENTANYL CITRATE (PF) 100 MCG/2ML IJ SOLN
INTRAMUSCULAR | Status: DC | PRN
  Administered 2021-04-20 (×4): 25 ug via INTRAVENOUS

## 2021-04-20 MED ORDER — FENTANYL CITRATE (PF) 100 MCG/2ML IJ SOLN
INTRAMUSCULAR | Status: AC
  Filled 2021-04-20: qty 2

## 2021-04-20 MED ORDER — DEXAMETHASONE SODIUM PHOSPHATE 4 MG/ML IJ SOLN
INTRAMUSCULAR | Status: DC | PRN
  Administered 2021-04-20: 7 mg via INTRAVENOUS

## 2021-04-20 MED ORDER — PHENYLEPHRINE HCL 2.5 % OP SOLN
OPHTHALMIC | Status: AC
  Filled 2021-04-20: qty 2

## 2021-04-20 MED ORDER — LACTATED RINGERS IV SOLN: INTRAVENOUS | Status: DC

## 2021-04-20 MED ORDER — ONDANSETRON HCL 4 MG/2ML IJ SOLN
INTRAMUSCULAR | Status: DC | PRN
Start: 2021-04-20 — End: 2021-04-20
  Administered 2021-04-20: 4 mg via INTRAVENOUS

## 2021-04-20 MED ORDER — LIDOCAINE 2% (20 MG/ML) 5 ML SYRINGE
INTRAMUSCULAR | Status: DC | PRN
  Administered 2021-04-20: 60 mg via INTRAVENOUS

## 2021-04-20 MED ORDER — EPHEDRINE SULFATE 50 MG/ML IJ SOLN
INTRAMUSCULAR | Status: DC | PRN
  Administered 2021-04-20 (×2): 10 mg via INTRAVENOUS
  Administered 2021-04-20: 5 mg via INTRAVENOUS
  Administered 2021-04-20: 10 mg via INTRAVENOUS
  Administered 2021-04-20: 5 mg via INTRAVENOUS

## 2021-04-20 MED ORDER — AMISULPRIDE (ANTIEMETIC) 5 MG/2ML IV SOLN: 10.0000 mg | Freq: Once | INTRAVENOUS | Status: DC | PRN

## 2021-04-20 MED ORDER — EPHEDRINE 5 MG/ML INJ
INTRAVENOUS | Status: AC
  Filled 2021-04-20: qty 5

## 2021-04-20 MED ORDER — PROPOFOL 10 MG/ML IV BOLUS
INTRAVENOUS | Status: AC
  Filled 2021-04-20: qty 20

## 2021-04-20 MED ORDER — OXYCODONE HCL 5 MG PO TABS: 5.0000 mg | ORAL_TABLET | Freq: Once | ORAL | Status: DC | PRN

## 2021-04-20 MED ORDER — PROMETHAZINE HCL 25 MG/ML IJ SOLN: 6.2500 mg | INTRAMUSCULAR | Status: DC | PRN

## 2021-04-20 MED ORDER — PHENYLEPHRINE 40 MCG/ML (10ML) SYRINGE FOR IV PUSH (FOR BLOOD PRESSURE SUPPORT)
PREFILLED_SYRINGE | INTRAVENOUS | Status: AC
  Filled 2021-04-20: qty 10

## 2021-04-20 MED ORDER — TOBRAMYCIN-DEXAMETHASONE 0.3-0.1 % OP SUSP
OPHTHALMIC | Status: AC
  Filled 2021-04-20: qty 2.5

## 2021-04-20 MED ORDER — BUPIVACAINE HCL 0.5 % IJ SOLN
INTRAMUSCULAR | Status: DC | PRN
  Administered 2021-04-20: 3 mL

## 2021-04-20 MED ORDER — OXYCODONE HCL 5 MG/5ML PO SOLN: 5.0000 mg | Freq: Once | ORAL | Status: DC | PRN

## 2021-04-20 MED ORDER — NEOMYCIN-POLYMYXIN-DEXAMETH 0.1 % OP OINT
TOPICAL_OINTMENT | OPHTHALMIC | Status: DC | PRN
  Administered 2021-04-20: 1 via OPHTHALMIC

## 2021-04-20 MED ORDER — BSS IO SOLN
INTRAOCULAR | Status: DC | PRN
  Administered 2021-04-20: 15 mL

## 2021-04-20 MED ORDER — TOBRAMYCIN-DEXAMETHASONE 0.3-0.1 % OP SUSP: 1.0000 [drp] | Freq: Four times a day (QID) | OPHTHALMIC | 0 refills | Status: AC

## 2021-04-20 SURGICAL SUPPLY — 34 items
APL SRG 3 HI ABS STRL LF PLS (MISCELLANEOUS) ×1
APL SWBSTK 6 STRL LF DISP (MISCELLANEOUS) ×4
APPLICATOR COTTON TIP 6 STRL (MISCELLANEOUS) ×4 IMPLANT
APPLICATOR COTTON TIP 6IN STRL (MISCELLANEOUS) ×8
APPLICATOR DR MATTHEWS STRL (MISCELLANEOUS) ×2 IMPLANT
BNDG EYE OVAL (GAUZE/BANDAGES/DRESSINGS) IMPLANT
CAUTERY EYE LOW TEMP 1300F FIN (OPHTHALMIC RELATED) IMPLANT
CORD BIPOLAR FORCEPS 12FT (ELECTRODE) ×2 IMPLANT
COVER BACK TABLE 60X90IN (DRAPES) ×2 IMPLANT
COVER MAYO STAND STRL (DRAPES) ×2 IMPLANT
DRAPE SURG 17X23 STRL (DRAPES) IMPLANT
DRAPE U-SHAPE 47X51 STRL (DRAPES) ×2 IMPLANT
DRAPE U-SHAPE 76X120 STRL (DRAPES) ×2 IMPLANT
GLOVE SURG ENC MOIS LTX SZ6.5 (GLOVE) ×2 IMPLANT
GLOVE SURG ENC MOIS LTX SZ7 (GLOVE) ×2 IMPLANT
GLOVE SURG POLYISO LF SZ6.5 (GLOVE) ×2 IMPLANT
GLOVE SURG UNDER POLY LF SZ6.5 (GLOVE) ×2 IMPLANT
GOWN STRL REUS W/ TWL LRG LVL3 (GOWN DISPOSABLE) ×2 IMPLANT
GOWN STRL REUS W/TWL LRG LVL3 (GOWN DISPOSABLE) ×4
NS IRRIG 1000ML POUR BTL (IV SOLUTION) ×2 IMPLANT
PACK BASIN DAY SURGERY FS (CUSTOM PROCEDURE TRAY) ×2 IMPLANT
SHEILD EYE MED CORNL SHD 22X21 (OPHTHALMIC RELATED)
SHIELD EYE MED CORNL SHD 22X21 (OPHTHALMIC RELATED) IMPLANT
SPEAR EYE SURG WECK-CEL (MISCELLANEOUS) ×2 IMPLANT
STRIP CLOSURE SKIN 1/4X4 (GAUZE/BANDAGES/DRESSINGS) IMPLANT
SUT 6 0 SILK T G140 8DA (SUTURE) IMPLANT
SUT CHROMIC 7 0 TG140 8 (SUTURE) ×2 IMPLANT
SUT SILK 4 0 C 3 735G (SUTURE) IMPLANT
SUT VICRYL 6 0 S 28 (SUTURE) ×4 IMPLANT
SUT VICRYL ABS 6-0 S29 18IN (SUTURE) IMPLANT
SYR 10ML LL (SYRINGE) ×2 IMPLANT
SYR 3ML 18GX1 1/2 (SYRINGE) ×4 IMPLANT
TOWEL GREEN STERILE FF (TOWEL DISPOSABLE) ×2 IMPLANT
TRAY DSU PREP LF (CUSTOM PROCEDURE TRAY) ×2 IMPLANT

## 2021-04-20 NOTE — Discharge Instructions (Addendum)
Pain control - next dose per anesthesia instructions:  1)  Ibuprofen 600 mg by mouth every 6-8 hours as needed for pain  2)  Acetaminophen 325 one or two by mouth every 4-6 hours as needed for pain that is not resolved by ibuprofen; may alternate with ibuprofen every 2-3 hours for best results  This regimen has been clinically studied and proven as effective against pain as morphine.  Eye medications:  Antibiotic eye drops or ointment, one drop or application in the operated eye(s) 4 times a day for 7 days.    Activity: No swimming for 1 week. It is OK to let water run over the face and eyes while showering or taking a bath, even during the first week.  No other restriction on exercise or activity.  Eye movement: The eyes may look very slightly crossed in or turned out, and you may experience temporary diplopia. This is not unusual postoperatively and may happen up to two months after surgery while the muscles are healing. The eyes may be tired during the first few weeks after surgery; reading can be uncomfortable during the healing process but will not hurt the eyes.  Call Dr. Serita Grit office [phone] with any problems or concerns.  _____________________________________________  No ibuprofen until after 4:40pm today if needed   Post Anesthesia Home Care Instructions  Activity: Get plenty of rest for the remainder of the day. A responsible individual must stay with you for 24 hours following the procedure.  For the next 24 hours, DO NOT: -Drive a car -Paediatric nurse -Drink alcoholic beverages -Take any medication unless instructed by your physician -Make any legal decisions or sign important papers.  Meals: Start with liquid foods such as gelatin or soup. Progress to regular foods as tolerated. Avoid greasy, spicy, heavy foods. If nausea and/or vomiting occur, drink only clear liquids until the nausea and/or vomiting subsides. Call your physician if vomiting continues.  Special  Instructions/Symptoms: Your throat may feel dry or sore from the anesthesia or the breathing tube placed in your throat during surgery. If this causes discomfort, gargle with warm salt water. The discomfort should disappear within 24 hours.  If you had a scopolamine patch placed behind your ear for the management of post- operative nausea and/or vomiting:  1. The medication in the patch is effective for 72 hours, after which it should be removed.  Wrap patch in a tissue and discard in the trash. Wash hands thoroughly with soap and water. 2. You may remove the patch earlier than 72 hours if you experience unpleasant side effects which may include dry mouth, dizziness or visual disturbances. 3. Avoid touching the patch. Wash your hands with soap and water after contact with the patch.

## 2021-04-20 NOTE — Transfer of Care (Signed)
Immediate Anesthesia Transfer of Care Note  Patient: David Buck  Procedure(s) Performed: STRABISMUS REPAIR RIGHT EYE (Right: Eye)  Patient Location: PACU  Anesthesia Type:General  Level of Consciousness: drowsy  Airway & Oxygen Therapy: Patient Spontanous Breathing and Patient connected to face mask oxygen  Post-op Assessment: Report given to RN and Post -op Vital signs reviewed and stable  Post vital signs: Reviewed and stable  Last Vitals:  Vitals Value Taken Time  BP 126/70 04/20/21 1049  Temp    Pulse 68 04/20/21 1051  Resp 14 04/20/21 1051  SpO2 100 % 04/20/21 1051  Vitals shown include unvalidated device data.  Last Pain:  Vitals:   04/20/21 0805  TempSrc: Oral  PainSc: 0-No pain         Complications: No notable events documented.

## 2021-04-20 NOTE — Anesthesia Procedure Notes (Signed)
Procedure Name: LMA Insertion Date/Time: 04/20/2021 9:50 AM Performed by: Ezequiel Kayser, CRNA Pre-anesthesia Checklist: Patient identified, Emergency Drugs available, Suction available and Patient being monitored Patient Re-evaluated:Patient Re-evaluated prior to induction Oxygen Delivery Method: Circle System Utilized Preoxygenation: Pre-oxygenation with 100% oxygen Induction Type: IV induction Ventilation: Mask ventilation without difficulty LMA: LMA flexible inserted LMA Size: 4.0 Number of attempts: 1 Airway Equipment and Method: Bite block Placement Confirmation: positive ETCO2 Tube secured with: Tape Dental Injury: Teeth and Oropharynx as per pre-operative assessment

## 2021-04-20 NOTE — Anesthesia Postprocedure Evaluation (Signed)
Anesthesia Post Note  Patient: David Buck  Procedure(s) Performed: STRABISMUS REPAIR RIGHT EYE (Right: Eye)     Patient location during evaluation: PACU Anesthesia Type: General Level of consciousness: awake and alert Pain management: pain level controlled Vital Signs Assessment: post-procedure vital signs reviewed and stable Respiratory status: spontaneous breathing, nonlabored ventilation and respiratory function stable Cardiovascular status: blood pressure returned to baseline and stable Postop Assessment: no apparent nausea or vomiting Anesthetic complications: no   No notable events documented.  Last Vitals:  Vitals:   04/20/21 1145 04/20/21 1215  BP: 132/78 (!) 141/82  Pulse: 68 70  Resp: 14 14  Temp:    SpO2: 97% 95%    Last Pain:  Vitals:   04/20/21 1215  TempSrc:   PainSc: 0-No pain                 [name]

## 2021-04-20 NOTE — Interval H&P Note (Signed)
History and Physical Interval Note:  04/20/2021 9:22 AM  David Buck  has presented today for surgery, with the diagnosis of Right Hypotropia and ESOTROPIA.  The various methods of treatment have been discussed with the patient and family. After consideration of risks, benefits and other options for treatment, the patient has consented to  Procedure(s): REPAIR STRABISMUS RIGHT EYE (Right) as a surgical intervention.  The patient's history has been reviewed, patient examined, no change in status, stable for surgery.  I have reviewed the patient's chart and labs.  Questions were answered to the patient's satisfaction.     Lamonte Sakai

## 2021-04-20 NOTE — Anesthesia Preprocedure Evaluation (Signed)
Anesthesia Evaluation  Patient identified by MRN, date of birth, ID band Patient awake    Reviewed: Allergy & Precautions, NPO status , Patient's Chart, lab work & pertinent test results  Airway Mallampati: II  TM Distance: >3 FB Neck ROM: full    Dental no notable dental hx.    Pulmonary COPD, Current Smoker, former smoker,    Pulmonary exam normal breath sounds clear to auscultation       Cardiovascular hypertension, Pt. on medications + CAD and + Peripheral Vascular Disease  Normal cardiovascular exam Rhythm:regular Rate:Normal  LAFB   Neuro/Psych Depression TIA   GI/Hepatic   Endo/Other  Hypothyroidism   Renal/GU      Musculoskeletal  (+) Arthritis , Osteoarthritis,    Abdominal   Peds  Hematology   Anesthesia Other Findings   Reproductive/Obstetrics                             Anesthesia Physical  Anesthesia Plan  ASA: III  Anesthesia Plan: General   Post-op Pain Management:    Induction: Intravenous  PONV Risk Score and Plan: 2 and Ondansetron, Midazolam and Treatment may vary due to age or medical condition  Airway Management Planned: LMA  Additional Equipment:   Intra-op Plan:   Post-operative Plan: Extubation in OR  Informed Consent: I have reviewed the patients History and Physical, chart, labs and discussed the procedure including the risks, benefits and alternatives for the proposed anesthesia with the patient or authorized representative who has indicated his/her understanding and acceptance.       Plan Discussed with: CRNA, Anesthesiologist and Surgeon  Anesthesia Plan Comments:         Anesthesia Quick Evaluation

## 2021-04-20 NOTE — Op Note (Signed)
04/20/2021  10:57 AM  PATIENT:  David Buck  80 y.o. male  PRE-OPERATIVE DIAGNOSIS:   1. Right hypotropia  2. Esotropia  3. Thyroid eye disease with history of completed Tepezza course; residual fat infiltration of rectus muscles on post-course CT scan  4. Intractable diplopia  5. History of high myopia, IOL placement OU  6. 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   ANESTHESIA:  General LMA and subTenons Marcaine  COMPLICATIONS: None immediate  DESCRIPTION OF PROCEDURE: The patient was taken to the operating room where He was identified by me. General anesthesia was induced without difficulty after placement of appropriate monitors. The patient was prepped and draped in standard sterile fashion. Maxitrol eye ointment was placed on both eyes for corneal protection during the case. A lid speculum was placed in the right eye.  Through an inferonasal fornix incision through conjunctiva and Tenon's fascia, the right medial rectus muscle was engaged on a series of muscle hooks and cleared of its fascial attachments. The tendon was secured with a double-armed 6-0 Vicryl suture with a double locking bite at each border of the muscle, 1 mm from the insertion. The muscle was disinserted, and was reattached to sclera at a measured distance of 4 millimeters posterior to the original insertion, using direct scleral passes in crossed swords fashion.  The suture ends were tied securely after the position of the muscle had been checked and found to be accurate.   The conjunctival incision was extended inferiorly. The right inferior rectus muscle was engaged on a series of muscle hooks and cleared of its fascial attachments. It was noted to be extremely tight; a retractor was used to  gently expose the tendinous insertion. The tendon was secured with a double-armed 6-0 Vicryl suture with a double locking bite at each border of the muscle, 1 mm from the insertion. The muscle was disinserted, and was reattached to sclera at a measured distance of 7 millimeters posterior to the original insertion, using direct scleral passes in crossed swords fashion.  The suture ends were tied securely after the position of the muscle had been checked and found to be accurate.   Approximately 1.15mL of 0.75% bupivacaine was injected antero-peribulbar for postoperative anesthesia. Conjunctiva was closed with 2 7-0 Chromic sutures.  Two drops of dilute betadine were placed on each eye and rinsed after ten seconds; Maxitrol ointment was placed in each eye. The patient was awakened without difficulty and taken to the recovery room in stable condition, having suffered no intraoperative or immediate postoperative complications.  Thomes Cake, M.D.

## 2021-04-21 ENCOUNTER — Encounter: Payer: Self-pay | Admitting: Internal Medicine

## 2021-04-21 ENCOUNTER — Encounter (HOSPITAL_BASED_OUTPATIENT_CLINIC_OR_DEPARTMENT_OTHER): Payer: Self-pay | Admitting: Ophthalmology

## 2021-04-21 NOTE — Progress Notes (Signed)
Left message stating courtesy call and if any questions or concerns please call the doctors office.  

## 2021-04-26 ENCOUNTER — Other Ambulatory Visit (INDEPENDENT_AMBULATORY_CARE_PROVIDER_SITE_OTHER): Payer: Medicare Other

## 2021-04-26 ENCOUNTER — Other Ambulatory Visit: Payer: Self-pay

## 2021-04-26 DIAGNOSIS — E034 Atrophy of thyroid (acquired): Secondary | ICD-10-CM | POA: Diagnosis not present

## 2021-04-26 DIAGNOSIS — R251 Tremor, unspecified: Secondary | ICD-10-CM

## 2021-04-26 DIAGNOSIS — I10 Essential (primary) hypertension: Secondary | ICD-10-CM

## 2021-04-26 LAB — CBC WITH DIFFERENTIAL/PLATELET
Basophils Absolute: 0.1 10*3/uL (ref 0.0–0.1)
Basophils Relative: 0.9 % (ref 0.0–3.0)
Eosinophils Absolute: 0.6 10*3/uL (ref 0.0–0.7)
Eosinophils Relative: 8.4 % — ABNORMAL HIGH (ref 0.0–5.0)
HCT: 39.2 % (ref 39.0–52.0)
Hemoglobin: 12.9 g/dL — ABNORMAL LOW (ref 13.0–17.0)
Lymphocytes Relative: 20.5 % (ref 12.0–46.0)
Lymphs Abs: 1.5 10*3/uL (ref 0.7–4.0)
MCHC: 32.8 g/dL (ref 30.0–36.0)
MCV: 93.7 fl (ref 78.0–100.0)
Monocytes Absolute: 0.6 10*3/uL (ref 0.1–1.0)
Monocytes Relative: 8.7 % (ref 3.0–12.0)
Neutro Abs: 4.4 10*3/uL (ref 1.4–7.7)
Neutrophils Relative %: 61.5 % (ref 43.0–77.0)
Platelets: 232 10*3/uL (ref 150.0–400.0)
RBC: 4.19 Mil/uL — ABNORMAL LOW (ref 4.22–5.81)
RDW: 13.1 % (ref 11.5–15.5)
WBC: 7.2 10*3/uL (ref 4.0–10.5)

## 2021-04-26 LAB — T4, FREE: Free T4: 0.78 ng/dL (ref 0.60–1.60)

## 2021-04-26 LAB — TSH: TSH: 2.98 u[IU]/mL (ref 0.35–5.50)

## 2021-04-26 LAB — T3, FREE: T3, Free: 2.9 pg/mL (ref 2.3–4.2)

## 2021-04-27 LAB — IRON,TIBC AND FERRITIN PANEL
%SAT: 20 % (calc) (ref 20–48)
Ferritin: 76 ng/mL (ref 24–380)
Iron: 75 ug/dL (ref 50–180)
TIBC: 383 mcg/dL (calc) (ref 250–425)

## 2021-05-12 DIAGNOSIS — H442A2 Degenerative myopia with choroidal neovascularization, left eye: Secondary | ICD-10-CM | POA: Diagnosis not present

## 2021-05-12 DIAGNOSIS — H35373 Puckering of macula, bilateral: Secondary | ICD-10-CM | POA: Diagnosis not present

## 2021-06-06 DIAGNOSIS — H02531 Eyelid retraction right upper eyelid: Secondary | ICD-10-CM | POA: Diagnosis not present

## 2021-06-06 DIAGNOSIS — H43392 Other vitreous opacities, left eye: Secondary | ICD-10-CM | POA: Diagnosis not present

## 2021-06-06 DIAGNOSIS — E063 Autoimmune thyroiditis: Secondary | ICD-10-CM | POA: Diagnosis not present

## 2021-06-06 DIAGNOSIS — H532 Diplopia: Secondary | ICD-10-CM | POA: Diagnosis not present

## 2021-06-07 DIAGNOSIS — I471 Supraventricular tachycardia: Secondary | ICD-10-CM | POA: Diagnosis not present

## 2021-06-07 DIAGNOSIS — I10 Essential (primary) hypertension: Secondary | ICD-10-CM | POA: Diagnosis not present

## 2021-06-12 DIAGNOSIS — H442A2 Degenerative myopia with choroidal neovascularization, left eye: Secondary | ICD-10-CM | POA: Diagnosis not present

## 2021-06-12 DIAGNOSIS — H35371 Puckering of macula, right eye: Secondary | ICD-10-CM | POA: Diagnosis not present

## 2021-06-13 ENCOUNTER — Telehealth: Payer: Self-pay | Admitting: Internal Medicine

## 2021-06-13 NOTE — Telephone Encounter (Signed)
Left message for patient to call back to schedule Medicare Annual Wellness Visit   Last AWV  05/16/20  Please schedule at anytime with [facility] if patient calls the office back.    40 Minutes appointment   Any questions, please call me at [phone]

## 2021-06-14 ENCOUNTER — Other Ambulatory Visit: Payer: Self-pay | Admitting: Internal Medicine

## 2021-06-25 HISTORY — PX: STRABISMUS SURGERY: SHX218

## 2021-06-27 ENCOUNTER — Other Ambulatory Visit: Payer: Self-pay

## 2021-06-27 ENCOUNTER — Ambulatory Visit (INDEPENDENT_AMBULATORY_CARE_PROVIDER_SITE_OTHER): Payer: Medicare Other

## 2021-06-27 DIAGNOSIS — Z0000 Encounter for general adult medical examination without abnormal findings: Secondary | ICD-10-CM

## 2021-06-27 NOTE — Progress Notes (Addendum)
I connected with David Buck today by telephone and verified that I am speaking with the correct person using two identifiers. Location patient: home Location provider: work Persons participating in the virtual visit: patient, provider.   I discussed the limitations, risks, security and privacy concerns of performing an evaluation and management service by telephone and the availability of in person appointments. I also discussed with the patient that there may be a patient responsible charge related to this service. The patient expressed understanding and verbally consented to this telephonic visit.    Interactive audio and video telecommunications were attempted between this provider and patient, however failed, due to patient having technical difficulties OR patient did not have access to video capability.  We continued and completed visit with audio only.  Some vital signs may be absent or patient reported.   Time Spent with patient on telephone encounter: 40 minutes  Subjective:   David Buck is a 81 y.o. male who presents for Medicare Annual/Subsequent preventive examination.  Review of Systems     Cardiac Risk Factors include: advanced age (>44men, >64 women);hypertension;male gender;family history of premature cardiovascular disease     Objective:    There were no vitals filed for this visit. There is no height or weight on file to calculate BMI.  Advanced Directives 06/27/2021 04/20/2021 04/12/2021 05/16/2020 04/14/2020 03/23/2020 03/11/2020  Does Patient Have a Medical Advance Directive? Yes Yes Yes Yes Yes No No  Type of Advance Directive Living will;Healthcare Power of [place];Living will - Living will;Healthcare Power of [place];Living will - -  Does patient want to make changes to medical advance directive? No - Patient declined No - Patient declined - No - Patient declined - - -  Copy of [place] in Chart? No - copy requested No - copy requested No - copy requested No - copy requested No - copy requested - -  Would patient like information on creating a medical advance directive? - No - Patient declined No - Patient declined - - - -    Current Medications (verified) Outpatient Encounter Medications as of 06/27/2021  Medication Sig   Cholecalciferol (VITAMIN D3) 50 MCG (2000 UT) TABS Take 2,000 Units by mouth every evening.   Glucosamine Sulfate 500 MG TABS Take 1 by mouth daily   loratadine (CLARITIN) 10 MG tablet Take 10 mg by mouth daily as needed for allergies.   LORazepam (ATIVAN) 0.5 MG tablet TAKE ONE TABLET BY MOUTH TWICE A DAY AS NEEDED FOR ANXIETY (Patient taking differently: Took .25 mg)   Multiple Vitamin (MULTIVITAMIN WITH MINERALS) TABS tablet Take 1 tablet by mouth every evening.   olmesartan (BENICAR) 20 MG tablet TAKE ONE TABLET BY MOUTH DAILY   Polyethyl Glycol-Propyl Glycol (SYSTANE ULTRA) 0.4-0.3 % SOLN Place 1 drop into both eyes every evening.   Ranibizumab (LUCENTIS IZ) 1 Dose by Intravitreal route every 28 (twenty-eight) days.    vitamin C (ASCORBIC ACID) 500 MG tablet Take 500 mg by mouth every evening.    ZINC PICOLINATE PO Take 22 mg by mouth every evening.   No facility-administered encounter medications on file as of 06/27/2021.    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   Meniere's disease    Osteoarthritis of right knee    Dr Percell Miller   Pneumonia    hx of 2/17    Posterior vitreous detachment 2011   R, body- Dr Baird Cancer   Prostatitis    Tinnitus    Past Surgical History:  Procedure Laterality Date   bilateral cataract surgery      BRONCHOSCOPY  04-14-08    benign   INGUINAL HERNIA REPAIR  2005   Dr Hassell Done- bilateral    right knee menisc tear repair     STRABISMUS SURGERY Right 04/20/2021   Procedure: STRABISMUS REPAIR RIGHT EYE;  Surgeon: Lamonte Sakai, MD;  Location: [facility];  Service: Ophthalmology;  Laterality: Right;   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   Procedure: RIGHT TOTAL KNEE ARTHROPLASTY;  Surgeon: Gaynelle Arabian, MD;  Location: [facility];  Service: Orthopedics;  Laterality: Right;   VIDEO BRONCHOSCOPY N/A 04/14/2020   Procedure: VIDEO BRONCHOSCOPY WITHOUT FLUORO;  Surgeon: Margaretha Seeds, MD;  Location: [facility];  Service: Cardiopulmonary;  Laterality: N/A;   Family History  Problem Relation Age of Onset   Cancer Mother        ovarian and bowel   Other Father        cerebral hemorrage   Hypertension Other    Social History   Socioeconomic History   Marital status: Married    Spouse name: Not on file   Number of children: Not on file   Years of education: Not on file   Highest education level: Not on file  Occupational History   Not on file  Tobacco Use   Smoking status: Former    Packs/day: 1.00    Years: 50.00    Pack years: 50.00    Types: Cigarettes    Start date: 6    Quit date: 01/17/2021    Years since quitting: 0.4   Smokeless tobacco: Never   Tobacco comments:    about 1 per day- wearing patch  Vaping Use   Vaping Use: Never used  Substance and Sexual Activity   Alcohol use: Yes    Comment: occasional    Drug use: No   Sexual activity: Not on file  Other Topics Concern   Not on file  Social History Narrative   Not on file   Social Determinants of Health   Financial Resource Strain: Low Risk    Difficulty of Paying Living Expenses: Not hard at all  Food Insecurity: No Food Insecurity   Worried About Charity fundraiser in the Last Year: Never true   [place] in the Last Year: Never true  Transportation Needs: No Transportation Needs   Lack of  Transportation (Medical): No   Lack of Transportation (Non-Medical): No  Physical Activity: Inactive   Days of Exercise per Week: 0 days   Minutes of Exercise per Session: 0 min  Stress: No Stress Concern Present   Feeling of Stress : Not at all  Social Connections: Moderately Isolated   Frequency of Communication with Friends and Family: More than three times a week   Frequency of Social Gatherings with Friends and Family: Never   Attends Religious Services: Never   Marine scientist or Organizations: No   Attends Archivist Meetings: Never   Marital Status: Married    Tobacco Counseling Counseling given: Not Answered Tobacco comments: about 1 per day- wearing patch  Clinical Intake:  Pre-visit preparation completed: Yes  Pain : No/denies pain     Nutritional Risks: None Diabetes: No  How often do you need to have someone help you when you read instructions, pamphlets, or other written materials from your doctor or pharmacy?: 1 - Never What is the last grade level you completed in school?: Bachelor's Degree  Diabetic? no  Interpreter Needed?: No  Information entered by :: [name], LPN   Activities of Daily Living In your present state of health, do you have any difficulty performing the following activities: 06/27/2021 04/20/2021  Hearing? Y N  Vision? N Y  Difficulty concentrating or making decisions? N N  Walking or climbing stairs? N N  Dressing or bathing? N N  Doing errands, shopping? Y -  Comment Patient does not drive due to vision -  Preparing Food and eating ? N -  Using the Toilet? N -  In the past six months, have you accidently leaked urine? N -  Do you have problems with loss of bowel control? N -  Managing your Medications? N -  Managing your Finances? N -  Housekeeping or managing your Housekeeping? N -  Some recent data might be hidden    Patient Care Team: [name], [name], MD as PCP - General Deneise Lever, MD (Pulmonary Disease) Gatha Mayer, MD (Gastroenterology) Thurman Coyer, DO as Consulting Physician (Sports Medicine) Franchot Gallo, MD as Consulting Physician (Urology) Gaynelle Arabian, MD as Consulting Physician (Orthopedic Surgery) Jolyn Nap, MD as Referring Physician (Ophthalmology) Alphia Moh, MD as Referring Physician (Cardiology) Katheran James., MD (Endocrinology) Iran Ouch, MD as Referring Physician (Ophthalmology) Monna Fam, MD as Consulting Physician (Ophthalmology)  Indicate any recent Medical Services you may have received from other than [facility] providers in the past year (date may be approximate).     Assessment:   This is a routine wellness examination for [place].  Hearing/Vision screen Hearing Screening - Comments:: Patient wears hearing aids. Vision Screening - Comments:: Patient wears corrective glasses/contacts.  Eye exam done annually by: [facility]  Dietary issues and exercise activities discussed: Current Exercise Habits: The patient does not participate in regular exercise at present, Exercise limited by: cardiac condition(s);respiratory conditions(s);orthopedic condition(s);Other - see comments (gait disorder)   Goals Addressed               This Visit's Progress     Patient Stated (pt-stated)        Patient declined health goal at this time.      Depression Screen PHQ 2/9 Scores 06/27/2021 06/20/2020 05/16/2020 08/27/2019 10/28/2017 10/22/2016 10/22/2016  PHQ - 2 Score 0 0 0 0 0 0 0  PHQ- 9 Score - 0 - - - - -  Exception Documentation - - - - - - -    Fall Risk Fall Risk  06/27/2021 05/16/2020 08/27/2019 10/28/2017 10/22/2016  Falls in the past year? 0 0 0 No No  Number falls in past yr: 0 0 - - -  Injury with Fall? 0 0 - - -  Risk for fall due to : No Fall Risks No Fall Risks - - -  Follow up - Falls evaluation completed Falls evaluation completed - -    FALL RISK PREVENTION PERTAINING TO THE  HOME:  Any stairs in or around the home? No  If so, are there any without handrails? No  Home free of loose throw rugs in walkways, pet beds, electrical cords, etc?  Yes  Adequate lighting in your home to reduce risk of falls? Yes   ASSISTIVE DEVICES UTILIZED TO PREVENT FALLS:  Life alert? No  Use of a cane, walker or w/c? No  Grab bars in the bathroom? Yes  Shower chair or bench in shower? Yes  Elevated toilet seat or a handicapped toilet? Yes   TIMED UP AND GO:  Was the test performed? No .  Length of time to ambulate 10 feet: n/a sec.   Cognitive Function: Normal cognitive status assessed by direct observation by this Nurse Health Advisor. No abnormalities found.          Immunizations Immunization History  Administered Date(s) Administered   BCG 04/29/2018   Fluad Quad(high Dose 65+) 04/10/2019, 04/05/2020   H1N1 04/25/2008   Influenza Split 04/10/2011, 04/02/2012   Influenza Whole 05/07/2008, 04/18/2009, 04/04/2010   Influenza, High Dose Seasonal PF 03/25/2016, 04/24/2016, 04/05/2017, 04/29/2018   Influenza,inj,Quad PF,6+ Mos 03/20/2013, 04/09/2014, 04/18/2015   Influenza-Unspecified 03/25/2008, 03/25/2009, 03/26/2011, 04/10/2011, 04/02/2012, 03/20/2013, 04/09/2014, 04/18/2015, 04/24/2016, 04/05/2017, 04/05/2020   Moderna Sars-Covid-2 Vaccination 06/30/2019, 07/07/2019, 08/05/2019   PPD Test 04/29/2018   Pneumococcal Conjugate-13 05/15/2013   Pneumococcal Polysaccharide-23 07/23/2007, 04/26/2017   Pneumococcal-Unspecified 07/26/2006, 07/23/2007, 05/15/2013, 04/26/2017   Td 05/09/2009   Tdap 12/02/2017    TDAP status: Up to date  Flu Vaccine status: Up to date  Pneumococcal vaccine status: Up to date  Covid-19 vaccine status: Completed vaccines  Qualifies for Shingles Vaccine? Yes   Zostavax completed No   Shingrix Completed?: No.    Education has been provided regarding the importance of this vaccine. Patient has been advised to call insurance company to  determine out of pocket expense if they have not yet received this vaccine. Advised may also receive vaccine at local pharmacy or Health Dept. Verbalized acceptance and understanding.  Screening Tests Health Maintenance  Topic Date Due   Zoster Vaccines- Shingrix (1 of 2) Never done   COVID-19 Vaccine (4 - Booster) 09/30/2019   TETANUS/TDAP  12/03/2027   Pneumonia Vaccine 34+ Years old  Completed   INFLUENZA VACCINE  Completed   Hepatitis C Screening  Completed   HPV VACCINES  Aged Out    Health Maintenance  Health Maintenance Due  Topic Date Due   Zoster Vaccines- Shingrix (1 of 2) Never done   COVID-19 Vaccine (4 - Booster) 09/30/2019    Colorectal cancer screening: No longer required.   Lung Cancer Screening: (Low Dose CT Chest recommended if Age 87-80 years, 30 pack-year currently smoking OR have quit w/in 15years.) does not qualify.   Lung Cancer Screening Referral: no  Additional Screening:  Hepatitis C Screening: does qualify; Completed yes  Vision Screening: Recommended annual ophthalmology exams for early detection of glaucoma and other disorders of the eye. Is the patient up to date with their annual eye exam?  Yes  Who is the provider or what is the name of the office in which the patient attends annual eye exams? [facility] If pt is not established with a provider, would they like to be referred to a provider to establish care? No .   Dental Screening: Recommended annual dental exams for proper oral hygiene  Community Resource Referral / Chronic Care Management: CRR required this visit?  No   CCM required this visit?  No      Plan:     I have personally reviewed and noted the following in the patients chart:   Medical and social history Use of alcohol, tobacco  or illicit drugs  Current medications and supplements including opioid prescriptions. Patient is not currently taking opioid prescriptions. Functional ability and status Nutritional  status Physical activity Advanced directives List of other physicians Hospitalizations, surgeries, and ER visits in previous 12 months Vitals Screenings to include cognitive, depression, and falls Referrals and appointments  In addition, I have reviewed and discussed with patient certain preventive protocols, quality metrics, and best practice recommendations. A written personalized care plan for preventive services as well as general preventive health recommendations were provided to patient.     Sheral Flow, LPN   12/02/2491   Nurse Notes:  Patient is cogitatively intact. There were no vitals filed for this visit. There is no height or weight on file to calculate BMI.   Medical screening examination/treatment/procedure(s) were performed by non-physician practitioner and as supervising physician I was immediately available for consultation/collaboration.  I agree with above. Lew Dawes, MD

## 2021-07-11 ENCOUNTER — Encounter: Payer: Self-pay | Admitting: Internal Medicine

## 2021-07-11 ENCOUNTER — Other Ambulatory Visit: Payer: Self-pay

## 2021-07-11 ENCOUNTER — Ambulatory Visit (INDEPENDENT_AMBULATORY_CARE_PROVIDER_SITE_OTHER): Payer: Medicare Other | Admitting: Internal Medicine

## 2021-07-11 DIAGNOSIS — R972 Elevated prostate specific antigen [PSA]: Secondary | ICD-10-CM

## 2021-07-11 DIAGNOSIS — H539 Unspecified visual disturbance: Secondary | ICD-10-CM | POA: Diagnosis not present

## 2021-07-11 DIAGNOSIS — I10 Essential (primary) hypertension: Secondary | ICD-10-CM | POA: Diagnosis not present

## 2021-07-11 DIAGNOSIS — R1032 Left lower quadrant pain: Secondary | ICD-10-CM | POA: Diagnosis not present

## 2021-07-11 DIAGNOSIS — E034 Atrophy of thyroid (acquired): Secondary | ICD-10-CM | POA: Diagnosis not present

## 2021-07-11 NOTE — Assessment & Plan Note (Signed)
Cont on Amlodipine, Benicar

## 2021-07-11 NOTE — Assessment & Plan Note (Signed)
New - ?MSK L groin exam -  WNL, no pain or hernia

## 2021-07-11 NOTE — Progress Notes (Addendum)
Subjective:  Patient ID: David Buck, male    DOB: [birth year]  Age: 81 y.o. MRN: [id number]  CC: Annual Exam   HPI David Buck presents for L groin sharp pains at times. 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 He will see Dr Philbert Riser at [facility]. F/u on HTN  Outpatient Medications Prior to Visit  Medication Sig Dispense Refill   Cholecalciferol (VITAMIN D3) 50 MCG (2000 UT) TABS Take 2,000 Units by mouth every evening.     Glucosamine Sulfate 500 MG TABS Take 1 by mouth daily     loratadine (CLARITIN) 10 MG tablet Take 10 mg by mouth daily as needed for allergies.     LORazepam (ATIVAN) 0.5 MG tablet TAKE ONE TABLET BY MOUTH TWICE A DAY AS NEEDED FOR ANXIETY (Patient taking differently: Took .25 mg) 60 tablet 2   Multiple Vitamin (MULTIVITAMIN WITH MINERALS) TABS tablet Take 1 tablet by mouth every evening.     olmesartan (BENICAR) 20 MG tablet TAKE ONE TABLET BY MOUTH DAILY 90 tablet 2   Polyethyl Glycol-Propyl Glycol (SYSTANE ULTRA) 0.4-0.3 % SOLN Place 1 drop into both eyes every evening.     Ranibizumab (LUCENTIS IZ) 1 Dose by Intravitreal route every 28 (twenty-eight) days.      vitamin C (ASCORBIC ACID) 500 MG tablet Take 500 mg by mouth every evening.      ZINC PICOLINATE PO Take 22 mg by mouth every evening.     No facility-administered medications prior to visit.    ROS: Review of Systems  Constitutional:  Negative for appetite change, fatigue and unexpected weight change.  HENT:  Negative for congestion, nosebleeds, sneezing, sore throat and trouble swallowing.   Eyes:  Positive for visual disturbance. Negative for itching.  Respiratory:  Negative for cough.   Cardiovascular:  Negative for chest pain, palpitations and leg swelling.  Gastrointestinal:  Negative for abdominal distention, blood in stool, diarrhea and nausea.  Genitourinary:  Negative for frequency and hematuria.  Musculoskeletal:  Negative for back pain, gait problem, joint swelling  and neck pain.  Skin:  Negative for rash.  Neurological:  Negative for dizziness, tremors, speech difficulty and weakness.  Psychiatric/Behavioral:  Negative for agitation, dysphoric mood and sleep disturbance. The patient is not nervous/anxious.    Objective:  BP 120/64 (BP Location: Left Arm)    Pulse 66    Temp 97.9 F (36.6 C) (Oral)    Ht 6\' 2"  (1.88 m)    Wt 163 lb 6.4 oz (74.1 kg)    SpO2 96%    BMI 20.98 kg/m   BP Readings from Last 3 Encounters:  07/11/21 120/64  04/20/21 (!) 141/82  03/07/21 98/62    Wt Readings from Last 3 Encounters:  07/11/21 163 lb 6.4 oz (74.1 kg)  04/20/21 160 lb 4.4 oz (72.7 kg)  03/07/21 161 lb (73 kg)    Physical Exam Constitutional:      General: He is not in acute distress.    Appearance: He is well-developed.     Comments: NAD  Eyes:     Conjunctiva/sclera: Conjunctivae normal.     Pupils: Pupils are equal, round, and reactive to light.  Neck:     Thyroid: No thyromegaly.     Vascular: No JVD.  Cardiovascular:     Rate and Rhythm: Normal rate and regular rhythm.     Heart sounds: Normal heart sounds. No murmur heard.   No friction rub. No gallop.  Pulmonary:  Effort: Pulmonary effort is normal. No respiratory distress.     Breath sounds: Normal breath sounds. No wheezing or rales.  Chest:     Chest wall: No tenderness.  Abdominal:     General: Bowel sounds are normal. There is no distension.     Palpations: Abdomen is soft. There is no mass.     Tenderness: There is no abdominal tenderness. There is no guarding or rebound.  Musculoskeletal:        General: No tenderness. Normal range of motion.     Cervical back: Normal range of motion.  Lymphadenopathy:     Cervical: No cervical adenopathy.  Skin:    General: Skin is warm and dry.     Findings: No rash.  Neurological:     Mental Status: He is alert and oriented to person, place, and time.     Cranial Nerves: No cranial nerve deficit.     Motor: No abnormal muscle  tone.     Coordination: Coordination normal.     Gait: Gait normal.     Deep Tendon Reflexes: Reflexes are normal and symmetric.  Psychiatric:        Behavior: Behavior normal.        Thought Content: Thought content normal.        Judgment: Judgment normal.   L groin exam -  WNL, no pain or hernia Lab Results  Component Value Date   WBC 7.2 04/26/2021   HGB 12.9 (L) 04/26/2021   HCT 39.2 04/26/2021   PLT 232.0 04/26/2021   GLUCOSE 78 12/15/2020   CHOL 114 12/31/2018   TRIG 57.0 12/31/2018   HDL 53.30 12/31/2018   LDLCALC 49 12/31/2018   ALT 22 12/15/2020   AST 23 12/15/2020   NA 139 12/15/2020   K 4.3 12/15/2020   CL 104 12/15/2020   CREATININE 0.97 12/15/2020   BUN 25 (H) 12/15/2020   CO2 27 12/15/2020   TSH 2.98 04/26/2021   PSA 0.6 02/09/2020   INR 1.0 04/08/2020   HGBA1C 5.8 10/26/2010    No results found.  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 He will see Dr Philbert Riser at [facility].         No orders of the defined types were placed in this encounter.     Follow-up: Return in about 3 months (around 10/09/2021) for a follow-up visit.  Walker Kehr, MD

## 2021-07-11 NOTE — Assessment & Plan Note (Addendum)
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 He will see Dr Philbert Riser at [facility].

## 2021-07-11 NOTE — Assessment & Plan Note (Signed)
PSA q 12 mo

## 2021-07-11 NOTE — Assessment & Plan Note (Signed)
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

## 2021-07-17 DIAGNOSIS — H15833 Staphyloma posticum, bilateral: Secondary | ICD-10-CM | POA: Diagnosis not present

## 2021-07-17 DIAGNOSIS — H442E1 Degenerative myopia with other maculopathy, right eye: Secondary | ICD-10-CM | POA: Diagnosis not present

## 2021-07-17 DIAGNOSIS — H442A2 Degenerative myopia with choroidal neovascularization, left eye: Secondary | ICD-10-CM | POA: Diagnosis not present

## 2021-07-17 DIAGNOSIS — H35373 Puckering of macula, bilateral: Secondary | ICD-10-CM | POA: Diagnosis not present

## 2021-07-17 LAB — HM DIABETES EYE EXAM

## 2021-07-21 DIAGNOSIS — H35052 Retinal neovascularization, unspecified, left eye: Secondary | ICD-10-CM | POA: Insufficient documentation

## 2021-07-21 DIAGNOSIS — H5021 Vertical strabismus, right eye: Secondary | ICD-10-CM | POA: Diagnosis not present

## 2021-07-21 DIAGNOSIS — H5789 Other specified disorders of eye and adnexa: Secondary | ICD-10-CM | POA: Diagnosis not present

## 2021-07-21 DIAGNOSIS — E079 Disorder of thyroid, unspecified: Secondary | ICD-10-CM | POA: Diagnosis not present

## 2021-07-21 DIAGNOSIS — H532 Diplopia: Secondary | ICD-10-CM | POA: Diagnosis not present

## 2021-07-24 ENCOUNTER — Encounter: Payer: Self-pay | Admitting: Internal Medicine

## 2021-07-25 DIAGNOSIS — L821 Other seborrheic keratosis: Secondary | ICD-10-CM | POA: Diagnosis not present

## 2021-07-25 DIAGNOSIS — B353 Tinea pedis: Secondary | ICD-10-CM | POA: Diagnosis not present

## 2021-07-25 DIAGNOSIS — L853 Xerosis cutis: Secondary | ICD-10-CM | POA: Diagnosis not present

## 2021-07-25 DIAGNOSIS — L814 Other melanin hyperpigmentation: Secondary | ICD-10-CM | POA: Diagnosis not present

## 2021-07-25 DIAGNOSIS — L570 Actinic keratosis: Secondary | ICD-10-CM | POA: Diagnosis not present

## 2021-07-25 DIAGNOSIS — D1801 Hemangioma of skin and subcutaneous tissue: Secondary | ICD-10-CM | POA: Diagnosis not present

## 2021-08-10 DIAGNOSIS — I10 Essential (primary) hypertension: Secondary | ICD-10-CM | POA: Diagnosis not present

## 2021-08-10 DIAGNOSIS — Z8619 Personal history of other infectious and parasitic diseases: Secondary | ICD-10-CM | POA: Diagnosis not present

## 2021-08-10 DIAGNOSIS — Z79890 Hormone replacement therapy (postmenopausal): Secondary | ICD-10-CM | POA: Diagnosis not present

## 2021-08-10 DIAGNOSIS — Z79899 Other long term (current) drug therapy: Secondary | ICD-10-CM | POA: Diagnosis not present

## 2021-08-10 DIAGNOSIS — M1990 Unspecified osteoarthritis, unspecified site: Secondary | ICD-10-CM | POA: Diagnosis not present

## 2021-08-10 DIAGNOSIS — E0500 Thyrotoxicosis with diffuse goiter without thyrotoxic crisis or storm: Secondary | ICD-10-CM | POA: Diagnosis not present

## 2021-08-10 DIAGNOSIS — Z9889 Other specified postprocedural states: Secondary | ICD-10-CM | POA: Insufficient documentation

## 2021-08-10 DIAGNOSIS — E039 Hypothyroidism, unspecified: Secondary | ICD-10-CM | POA: Diagnosis not present

## 2021-08-10 DIAGNOSIS — I7300 Raynaud's syndrome without gangrene: Secondary | ICD-10-CM | POA: Diagnosis not present

## 2021-08-10 DIAGNOSIS — H5021 Vertical strabismus, right eye: Secondary | ICD-10-CM | POA: Diagnosis not present

## 2021-08-10 DIAGNOSIS — J449 Chronic obstructive pulmonary disease, unspecified: Secondary | ICD-10-CM | POA: Diagnosis not present

## 2021-08-21 DIAGNOSIS — H15833 Staphyloma posticum, bilateral: Secondary | ICD-10-CM | POA: Diagnosis not present

## 2021-08-21 DIAGNOSIS — H35373 Puckering of macula, bilateral: Secondary | ICD-10-CM | POA: Diagnosis not present

## 2021-09-01 DIAGNOSIS — Z20822 Contact with and (suspected) exposure to covid-19: Secondary | ICD-10-CM | POA: Diagnosis not present

## 2021-09-11 DIAGNOSIS — H15833 Staphyloma posticum, bilateral: Secondary | ICD-10-CM | POA: Diagnosis not present

## 2021-09-11 DIAGNOSIS — H442A2 Degenerative myopia with choroidal neovascularization, left eye: Secondary | ICD-10-CM | POA: Diagnosis not present

## 2021-09-11 DIAGNOSIS — H442E1 Degenerative myopia with other maculopathy, right eye: Secondary | ICD-10-CM | POA: Diagnosis not present

## 2021-09-11 DIAGNOSIS — H35431 Paving stone degeneration of retina, right eye: Secondary | ICD-10-CM | POA: Diagnosis not present

## 2021-09-11 DIAGNOSIS — H35373 Puckering of macula, bilateral: Secondary | ICD-10-CM | POA: Diagnosis not present

## 2021-10-01 DIAGNOSIS — S0990XA Unspecified injury of head, initial encounter: Secondary | ICD-10-CM | POA: Diagnosis not present

## 2021-10-01 DIAGNOSIS — R55 Syncope and collapse: Secondary | ICD-10-CM | POA: Diagnosis not present

## 2021-10-01 DIAGNOSIS — R42 Dizziness and giddiness: Secondary | ICD-10-CM | POA: Diagnosis not present

## 2021-10-01 DIAGNOSIS — M6281 Muscle weakness (generalized): Secondary | ICD-10-CM | POA: Diagnosis not present

## 2021-10-01 DIAGNOSIS — W19XXXA Unspecified fall, initial encounter: Secondary | ICD-10-CM | POA: Diagnosis not present

## 2021-10-01 DIAGNOSIS — Z87891 Personal history of nicotine dependence: Secondary | ICD-10-CM | POA: Diagnosis not present

## 2021-10-01 DIAGNOSIS — Z888 Allergy status to other drugs, medicaments and biological substances status: Secondary | ICD-10-CM | POA: Diagnosis not present

## 2021-10-01 DIAGNOSIS — I10 Essential (primary) hypertension: Secondary | ICD-10-CM | POA: Diagnosis not present

## 2021-10-01 DIAGNOSIS — R0789 Other chest pain: Secondary | ICD-10-CM | POA: Diagnosis not present

## 2021-10-02 ENCOUNTER — Telehealth: Payer: Self-pay | Admitting: Internal Medicine

## 2021-10-02 DIAGNOSIS — Z20822 Contact with and (suspected) exposure to covid-19: Secondary | ICD-10-CM | POA: Diagnosis not present

## 2021-10-02 NOTE — Telephone Encounter (Signed)
Had an episode, where he did not lose consciousness.  ? ?Ran lots of tests and everything came back fine. Diagnosis was dehydration. Was told to call and follow up with PCP. Did not want to make a follow up- just update provider.  ?

## 2021-10-03 NOTE — Telephone Encounter (Signed)
Noted. Thx.

## 2021-10-04 NOTE — Telephone Encounter (Signed)
Has not had repeat of issue.  ? ?BP has been higher than normal. 137/80 today at 4:37DH. Diastolic has been running in high 70/80- about 20 higher than what he is used to.  ? ?Requesting assistant call him.  ?

## 2021-10-06 NOTE — Telephone Encounter (Signed)
Notified pt w/MD response.../lmb 

## 2021-10-06 NOTE — Telephone Encounter (Signed)
Please continue to monitor blood pressure.  Start walking.  Low-salt diet ?Thanks ?

## 2021-10-12 ENCOUNTER — Encounter: Payer: Self-pay | Admitting: Internal Medicine

## 2021-10-12 ENCOUNTER — Ambulatory Visit (INDEPENDENT_AMBULATORY_CARE_PROVIDER_SITE_OTHER): Payer: Medicare Other | Admitting: Internal Medicine

## 2021-10-12 DIAGNOSIS — M2550 Pain in unspecified joint: Secondary | ICD-10-CM | POA: Diagnosis not present

## 2021-10-12 DIAGNOSIS — R55 Syncope and collapse: Secondary | ICD-10-CM | POA: Diagnosis not present

## 2021-10-12 DIAGNOSIS — Z96659 Presence of unspecified artificial knee joint: Secondary | ICD-10-CM | POA: Insufficient documentation

## 2021-10-12 DIAGNOSIS — R4589 Other symptoms and signs involving emotional state: Secondary | ICD-10-CM | POA: Diagnosis not present

## 2021-10-12 DIAGNOSIS — I10 Essential (primary) hypertension: Secondary | ICD-10-CM

## 2021-10-12 DIAGNOSIS — R0789 Other chest pain: Secondary | ICD-10-CM

## 2021-10-12 DIAGNOSIS — J309 Allergic rhinitis, unspecified: Secondary | ICD-10-CM | POA: Insufficient documentation

## 2021-10-12 DIAGNOSIS — E46 Unspecified protein-calorie malnutrition: Secondary | ICD-10-CM | POA: Insufficient documentation

## 2021-10-12 DIAGNOSIS — Z86010 Personal history of colonic polyps: Secondary | ICD-10-CM | POA: Insufficient documentation

## 2021-10-12 DIAGNOSIS — E034 Atrophy of thyroid (acquired): Secondary | ICD-10-CM

## 2021-10-12 DIAGNOSIS — H903 Sensorineural hearing loss, bilateral: Secondary | ICD-10-CM | POA: Insufficient documentation

## 2021-10-12 DIAGNOSIS — R2689 Other abnormalities of gait and mobility: Secondary | ICD-10-CM | POA: Diagnosis not present

## 2021-10-12 DIAGNOSIS — R918 Other nonspecific abnormal finding of lung field: Secondary | ICD-10-CM | POA: Insufficient documentation

## 2021-10-12 MED ORDER — BUPROPION HCL ER (XL) 150 MG PO TB24: 150.0000 mg | ORAL_TABLET | Freq: Every day | ORAL | 5 refills | Status: DC

## 2021-10-12 NOTE — Patient Instructions (Signed)
Blue-Emu cream -- use 2-3 times a day ? ?

## 2021-10-12 NOTE — Assessment & Plan Note (Signed)
R side contusion ?Blue-Emu cream -- use 2-3 times a day ?

## 2021-10-12 NOTE — Assessment & Plan Note (Signed)
Monitor TSH 

## 2021-10-12 NOTE — Assessment & Plan Note (Signed)
Back on Olmesartan - full tablet ?

## 2021-10-12 NOTE — Progress Notes (Signed)
? ?Subjective:  ?Patient ID: David Buck, male    DOB: 08-Jul-1940  Age: 81 y.o. MRN: [id number] ? ?CC: No chief complaint on file. ? ? ?HPI ?David Buck presents for near-syncope on 10/01/21 at 11 am. Pt went to ER - possible dehydration.  Follow-up on hypertension, hypothyroidism. ?David Buck is here with his wife [name].  She helps with the history ? ?Outpatient Medications Prior to Visit  ?Medication Sig Dispense Refill  ? amLODipine (NORVASC) 2.5 MG tablet SMARTSIG:1 By Mouth    ? Ascorbic Acid (VITAMIN C) 1000 MG tablet SMARTSIG:1 By Mouth    ? Cholecalciferol (VITAMIN D3) 50 MCG (2000 UT) TABS Take 2,000 Units by mouth every evening.    ? Fluoxetine HCl, PMDD, 10 MG TABS fluoxetine 10 mg tablet ? Take 1 tablet every day by oral route.    ? GLUCOSAMINE RELIEF 1000 MG TABS SMARTSIG:1 By Mouth    ? hydrochlorothiazide (MICROZIDE) 12.5 MG capsule Take by mouth.    ? levothyroxine (SYNTHROID) 75 MCG tablet Take by mouth.    ? loratadine (CLARITIN) 10 MG tablet Take 10 mg by mouth daily as needed for allergies.    ? loratadine (CLARITIN) 10 MG tablet Take 1 tablet by mouth daily.    ? LORazepam (ATIVAN) 0.5 MG tablet Take 1 tablet by mouth 2 (two) times daily as needed.    ? Multiple Vitamin (MULTIVITAMIN WITH MINERALS) TABS tablet Take 1 tablet by mouth every evening.    ? olmesartan (BENICAR) 20 MG tablet TAKE ONE TABLET BY MOUTH DAILY 90 tablet 2  ? Polyethyl Glycol-Propyl Glycol (SYSTANE ULTRA) 0.4-0.3 % SOLN Place 1 drop into both eyes every evening.    ? Ranibizumab (LUCENTIS IZ) 1 Dose by Intravitreal route every 28 (twenty-eight) days.     ? ZINC PICOLINATE PO Take 22 mg by mouth every evening.    ? FLUoxetine (PROZAC) 10 MG tablet Take 1 tablet by mouth daily.    ? Glucosamine Sulfate 500 MG TABS Take 1 by mouth daily    ? LORazepam (ATIVAN) 0.5 MG tablet TAKE ONE TABLET BY MOUTH TWICE A DAY AS NEEDED FOR ANXIETY (Patient taking differently: Took .25 mg) 60 tablet 2  ? olmesartan (BENICAR) 20 MG tablet  Take by mouth.    ? vitamin C (ASCORBIC ACID) 500 MG tablet Take 500 mg by mouth every evening.     ? D 1000 25 MCG (1000 UT) capsule SMARTSIG:1 By Mouth    ? ?No facility-administered medications prior to visit.  ? ? ?ROS: ?Review of Systems  ?Constitutional:  Positive for fatigue. Negative for appetite change and unexpected weight change.  ?HENT:  Negative for congestion, nosebleeds, sneezing, sore throat and trouble swallowing.   ?Eyes:  Negative for itching and visual disturbance.  ?Respiratory:  Negative for cough.   ?Cardiovascular:  Negative for chest pain, palpitations and leg swelling.  ?Gastrointestinal:  Negative for abdominal distention, blood in stool, diarrhea and nausea.  ?Genitourinary:  Negative for frequency and hematuria.  ?Musculoskeletal:  Positive for gait problem. Negative for back pain, joint swelling and neck pain.  ?Skin:  Negative for rash.  ?Neurological:  Positive for dizziness. Negative for tremors, speech difficulty and weakness.  ?Psychiatric/Behavioral:  Positive for sleep disturbance. Negative for agitation, dysphoric mood and suicidal ideas. The patient is not nervous/anxious.   ? ?Objective:  ?BP 132/72 (BP Location: Left Arm, Patient Position: Sitting, Cuff Size: Normal)   Pulse 67   Temp 97.9 ?F (36.6 ?C) (Oral)  Ht 6\' 2"  (1.88 m)   Wt 166 lb (75.3 kg)   SpO2 94%   BMI 21.31 kg/m?  ? ?BP Readings from Last 3 Encounters:  ?10/12/21 132/72  ?07/11/21 120/64  ?04/20/21 (!) 141/82  ? ? ?Wt Readings from Last 3 Encounters:  ?10/12/21 166 lb (75.3 kg)  ?07/11/21 163 lb 6.4 oz (74.1 kg)  ?04/20/21 160 lb 4.4 oz (72.7 kg)  ? ? ?Physical Exam ?Constitutional:   ?   General: He is not in acute distress. ?   Appearance: He is well-developed.  ?   Comments: NAD  ?Eyes:  ?   Conjunctiva/sclera: Conjunctivae normal.  ?   Pupils: Pupils are equal, round, and reactive to light.  ?Neck:  ?   Thyroid: No thyromegaly.  ?   Vascular: No JVD.  ?Cardiovascular:  ?   Rate and Rhythm: Normal  rate and regular rhythm.  ?   Heart sounds: Normal heart sounds. No murmur heard. ?  No friction rub. No gallop.  ?Pulmonary:  ?   Effort: Pulmonary effort is normal. No respiratory distress.  ?   Breath sounds: Normal breath sounds. No wheezing or rales.  ?Chest:  ?   Chest wall: No tenderness.  ?Abdominal:  ?   General: Bowel sounds are normal. There is no distension.  ?   Palpations: Abdomen is soft. There is no mass.  ?   Tenderness: There is no abdominal tenderness. There is no guarding or rebound.  ?Musculoskeletal:     ?   General: No tenderness. Normal range of motion.  ?   Cervical back: Normal range of motion.  ?Lymphadenopathy:  ?   Cervical: No cervical adenopathy.  ?Skin: ?   General: Skin is warm and dry.  ?   Findings: No rash.  ?Neurological:  ?   Mental Status: He is alert and oriented to person, place, and time.  ?   Cranial Nerves: No cranial nerve deficit.  ?   Motor: No abnormal muscle tone.  ?   Coordination: Coordination abnormal.  ?   Gait: Gait normal.  ?   Deep Tendon Reflexes: Reflexes are normal and symmetric.  ?Psychiatric:     ?   Behavior: Behavior normal.     ?   Thought Content: Thought content normal.     ?   Judgment: Judgment normal.  ?Poor balance when challenged ? ?Lab Results  ?Component Value Date  ? WBC 7.2 04/26/2021  ? HGB 12.9 (L) 04/26/2021  ? HCT 39.2 04/26/2021  ? PLT 232.0 04/26/2021  ? GLUCOSE 78 12/15/2020  ? CHOL 114 12/31/2018  ? TRIG 57.0 12/31/2018  ? HDL 53.30 12/31/2018  ? LDLCALC 49 12/31/2018  ? ALT 22 12/15/2020  ? AST 23 12/15/2020  ? NA 139 12/15/2020  ? K 4.3 12/15/2020  ? CL 104 12/15/2020  ? CREATININE 0.97 12/15/2020  ? BUN 25 (H) 12/15/2020  ? CO2 27 12/15/2020  ? TSH 2.98 04/26/2021  ? PSA 0.6 02/09/2020  ? INR 1.0 04/08/2020  ? HGBA1C 5.8 10/26/2010  ? ? ?No results found. ? ?Assessment & Plan:  ? ?Problem List Items Addressed This Visit   ? ? Hypothyroidism  ?  Monitor TSH ? ?  ?  ? Relevant Medications  ? levothyroxine (SYNTHROID) 75 MCG tablet  ?  Essential hypertension  ?  Back on Olmesartan - full tablet ? ?  ?  ? Relevant Medications  ? amLODipine (NORVASC) 2.5 MG tablet  ? hydrochlorothiazide (MICROZIDE) 12.5  MG capsule  ? Syncope  ?  Dehydration related - near-syncope ?Hydrate better ?  ?  ? Chest wall pain  ?  R side contusion ?Blue-Emu cream -- use 2-3 times a day ?  ?  ? Arthralgia  ?  Osteoarthritis.  Blue-Emu cream was recommended to use 2-3 times a day ? ? ?  ?  ? Balance disorder  ?  Multifactorial.  Advised balance training, chair yoga etc. ? ?  ?  ? Depressed mood  ?  Worse.  Discussed.  Will start Wellbutrin XL 150 mg daily.  Exercise, spend time outside ? ?  ?  ?  ? ? ?Meds ordered this encounter  ?Medications  ? buPROPion (WELLBUTRIN XL) 150 MG 24 hr tablet  ?  Sig: Take 1 tablet (150 mg total) by mouth daily.  ?  Dispense:  30 tablet  ?  Refill:  5  ?  ? ? ?Follow-up: Return in about 3 months (around 01/11/2022) for a follow-up visit. ? ?Walker Kehr, MD ?

## 2021-10-12 NOTE — Assessment & Plan Note (Signed)
Dehydration related - near-syncope ?Hydrate better ?

## 2021-10-13 DIAGNOSIS — H5021 Vertical strabismus, right eye: Secondary | ICD-10-CM | POA: Diagnosis not present

## 2021-10-13 DIAGNOSIS — H5789 Other specified disorders of eye and adnexa: Secondary | ICD-10-CM | POA: Diagnosis not present

## 2021-10-13 DIAGNOSIS — H532 Diplopia: Secondary | ICD-10-CM | POA: Diagnosis not present

## 2021-10-13 DIAGNOSIS — E079 Disorder of thyroid, unspecified: Secondary | ICD-10-CM | POA: Diagnosis not present

## 2021-10-15 DIAGNOSIS — R2689 Other abnormalities of gait and mobility: Secondary | ICD-10-CM | POA: Insufficient documentation

## 2021-10-15 DIAGNOSIS — R4589 Other symptoms and signs involving emotional state: Secondary | ICD-10-CM | POA: Insufficient documentation

## 2021-10-15 DIAGNOSIS — M2550 Pain in unspecified joint: Secondary | ICD-10-CM | POA: Insufficient documentation

## 2021-10-15 NOTE — Assessment & Plan Note (Signed)
Osteoarthritis.  Blue-Emu cream was recommended to use 2-3 times a day ? ?

## 2021-10-15 NOTE — Assessment & Plan Note (Signed)
Worse.  Discussed.  Will start Wellbutrin XL 150 mg daily.  Exercise, spend time outside ?

## 2021-10-15 NOTE — Assessment & Plan Note (Signed)
Multifactorial.  Advised balance training, chair yoga etc. ?

## 2021-10-17 DIAGNOSIS — H442A2 Degenerative myopia with choroidal neovascularization, left eye: Secondary | ICD-10-CM | POA: Diagnosis not present

## 2021-10-26 DIAGNOSIS — Z20822 Contact with and (suspected) exposure to covid-19: Secondary | ICD-10-CM | POA: Diagnosis not present

## 2021-11-01 DIAGNOSIS — I471 Supraventricular tachycardia: Secondary | ICD-10-CM | POA: Diagnosis not present

## 2021-11-01 DIAGNOSIS — I10 Essential (primary) hypertension: Secondary | ICD-10-CM | POA: Diagnosis not present

## 2021-11-01 DIAGNOSIS — R55 Syncope and collapse: Secondary | ICD-10-CM | POA: Diagnosis not present

## 2021-11-02 ENCOUNTER — Encounter: Payer: Self-pay | Admitting: Internal Medicine

## 2021-11-02 DIAGNOSIS — I471 Supraventricular tachycardia: Secondary | ICD-10-CM | POA: Diagnosis not present

## 2021-11-02 DIAGNOSIS — I491 Atrial premature depolarization: Secondary | ICD-10-CM | POA: Diagnosis not present

## 2021-11-14 DIAGNOSIS — H442A2 Degenerative myopia with choroidal neovascularization, left eye: Secondary | ICD-10-CM | POA: Diagnosis not present

## 2021-11-14 DIAGNOSIS — H35373 Puckering of macula, bilateral: Secondary | ICD-10-CM | POA: Diagnosis not present

## 2021-11-22 ENCOUNTER — Other Ambulatory Visit: Payer: Self-pay | Admitting: Internal Medicine

## 2021-11-27 DIAGNOSIS — H5021 Vertical strabismus, right eye: Secondary | ICD-10-CM | POA: Diagnosis not present

## 2021-11-27 DIAGNOSIS — H532 Diplopia: Secondary | ICD-10-CM | POA: Diagnosis not present

## 2021-11-27 DIAGNOSIS — Z9889 Other specified postprocedural states: Secondary | ICD-10-CM | POA: Diagnosis not present

## 2021-12-06 DIAGNOSIS — G4733 Obstructive sleep apnea (adult) (pediatric): Secondary | ICD-10-CM | POA: Diagnosis not present

## 2021-12-06 DIAGNOSIS — I10 Essential (primary) hypertension: Secondary | ICD-10-CM | POA: Diagnosis not present

## 2021-12-06 DIAGNOSIS — J449 Chronic obstructive pulmonary disease, unspecified: Secondary | ICD-10-CM | POA: Diagnosis not present

## 2021-12-12 DIAGNOSIS — H35371 Puckering of macula, right eye: Secondary | ICD-10-CM | POA: Diagnosis not present

## 2021-12-12 DIAGNOSIS — H442A2 Degenerative myopia with choroidal neovascularization, left eye: Secondary | ICD-10-CM | POA: Diagnosis not present

## 2021-12-12 DIAGNOSIS — H353221 Exudative age-related macular degeneration, left eye, with active choroidal neovascularization: Secondary | ICD-10-CM | POA: Diagnosis not present

## 2021-12-19 DIAGNOSIS — G4736 Sleep related hypoventilation in conditions classified elsewhere: Secondary | ICD-10-CM | POA: Diagnosis not present

## 2021-12-19 DIAGNOSIS — G4733 Obstructive sleep apnea (adult) (pediatric): Secondary | ICD-10-CM | POA: Diagnosis not present

## 2022-01-09 DIAGNOSIS — H442E1 Degenerative myopia with other maculopathy, right eye: Secondary | ICD-10-CM | POA: Diagnosis not present

## 2022-01-09 DIAGNOSIS — H442A2 Degenerative myopia with choroidal neovascularization, left eye: Secondary | ICD-10-CM | POA: Diagnosis not present

## 2022-01-09 DIAGNOSIS — H35373 Puckering of macula, bilateral: Secondary | ICD-10-CM | POA: Diagnosis not present

## 2022-01-09 DIAGNOSIS — H15833 Staphyloma posticum, bilateral: Secondary | ICD-10-CM | POA: Diagnosis not present

## 2022-01-09 DIAGNOSIS — H353221 Exudative age-related macular degeneration, left eye, with active choroidal neovascularization: Secondary | ICD-10-CM | POA: Diagnosis not present

## 2022-01-11 ENCOUNTER — Ambulatory Visit: Payer: Medicare Other | Admitting: Internal Medicine

## 2022-01-17 DIAGNOSIS — H26493 Other secondary cataract, bilateral: Secondary | ICD-10-CM | POA: Diagnosis not present

## 2022-01-17 DIAGNOSIS — H31093 Other chorioretinal scars, bilateral: Secondary | ICD-10-CM | POA: Diagnosis not present

## 2022-01-17 DIAGNOSIS — H40013 Open angle with borderline findings, low risk, bilateral: Secondary | ICD-10-CM | POA: Diagnosis not present

## 2022-01-17 DIAGNOSIS — H353131 Nonexudative age-related macular degeneration, bilateral, early dry stage: Secondary | ICD-10-CM | POA: Diagnosis not present

## 2022-01-18 DIAGNOSIS — G4733 Obstructive sleep apnea (adult) (pediatric): Secondary | ICD-10-CM | POA: Diagnosis not present

## 2022-01-25 DIAGNOSIS — G4733 Obstructive sleep apnea (adult) (pediatric): Secondary | ICD-10-CM

## 2022-01-25 HISTORY — DX: Obstructive sleep apnea (adult) (pediatric): G47.33

## 2022-01-29 ENCOUNTER — Ambulatory Visit (INDEPENDENT_AMBULATORY_CARE_PROVIDER_SITE_OTHER): Payer: Medicare Other

## 2022-01-29 ENCOUNTER — Ambulatory Visit (INDEPENDENT_AMBULATORY_CARE_PROVIDER_SITE_OTHER): Payer: Medicare Other | Admitting: Internal Medicine

## 2022-01-29 ENCOUNTER — Encounter: Payer: Self-pay | Admitting: Internal Medicine

## 2022-01-29 VITALS — BP 132/66 | HR 75 | Temp 97.6°F | Ht 74.0 in | Wt 172.0 lb

## 2022-01-29 DIAGNOSIS — R739 Hyperglycemia, unspecified: Secondary | ICD-10-CM

## 2022-01-29 DIAGNOSIS — R4589 Other symptoms and signs involving emotional state: Secondary | ICD-10-CM | POA: Diagnosis not present

## 2022-01-29 DIAGNOSIS — H532 Diplopia: Secondary | ICD-10-CM | POA: Diagnosis not present

## 2022-01-29 DIAGNOSIS — J449 Chronic obstructive pulmonary disease, unspecified: Secondary | ICD-10-CM

## 2022-01-29 DIAGNOSIS — G459 Transient cerebral ischemic attack, unspecified: Secondary | ICD-10-CM | POA: Diagnosis not present

## 2022-01-29 DIAGNOSIS — R972 Elevated prostate specific antigen [PSA]: Secondary | ICD-10-CM | POA: Diagnosis not present

## 2022-01-29 DIAGNOSIS — E034 Atrophy of thyroid (acquired): Secondary | ICD-10-CM

## 2022-01-29 DIAGNOSIS — R2689 Other abnormalities of gait and mobility: Secondary | ICD-10-CM

## 2022-01-29 DIAGNOSIS — I10 Essential (primary) hypertension: Secondary | ICD-10-CM | POA: Diagnosis not present

## 2022-01-29 DIAGNOSIS — Z8701 Personal history of pneumonia (recurrent): Secondary | ICD-10-CM | POA: Diagnosis not present

## 2022-01-29 MED ORDER — BUPROPION HCL ER (XL) 150 MG PO TB24: 150.0000 mg | ORAL_TABLET | Freq: Every day | ORAL | 3 refills | Status: DC

## 2022-01-29 MED ORDER — LORAZEPAM 0.5 MG PO TABS: ORAL_TABLET | ORAL | 3 refills | Status: DC

## 2022-01-29 NOTE — Assessment & Plan Note (Addendum)
Chronic  F/u wDr Young CXR today

## 2022-01-29 NOTE — Assessment & Plan Note (Signed)
Chronic since 2020

## 2022-01-29 NOTE — Assessment & Plan Note (Signed)
ASA 162 mg/d

## 2022-01-29 NOTE — Assessment & Plan Note (Signed)
Cont w/Wellbutrin XL 150 mg daily.

## 2022-01-29 NOTE — Assessment & Plan Note (Signed)
Vision dependent issues No  falls

## 2022-01-29 NOTE — Progress Notes (Signed)
Subjective:  Patient ID: David Buck, male    DOB: 03-05-1941  Age: 81 y.o. MRN: [id number]  CC: Follow-up (3 month f/u)   HPI Torrie Mayers presents for HTN, depression, COPD  Outpatient Medications Prior to Visit  Medication Sig Dispense Refill   Ascorbic Acid (VITAMIN C) 1000 MG tablet SMARTSIG:1 By Mouth     Cholecalciferol (VITAMIN D3) 50 MCG (2000 UT) TABS Take 2,000 Units by mouth every evening.     faricimab-svoa (VABYSMO) 6 MG/0.05ML SOLN intravitreal injection 6 mg by Intravitreal route once.     GLUCOSAMINE RELIEF 1000 MG TABS SMARTSIG:1 By Mouth     loratadine (CLARITIN) 10 MG tablet Take 1 tablet by mouth daily.     Multiple Vitamin (MULTIVITAMIN WITH MINERALS) TABS tablet Take 1 tablet by mouth every evening.     olmesartan (BENICAR) 20 MG tablet TAKE ONE TABLET BY MOUTH DAILY 90 tablet 2   Polyethyl Glycol-Propyl Glycol (SYSTANE ULTRA) 0.4-0.3 % SOLN Place 1 drop into both eyes every evening.     ZINC PICOLINATE PO Take 22 mg by mouth every evening.     buPROPion (WELLBUTRIN XL) 150 MG 24 hr tablet Take 1 tablet (150 mg total) by mouth daily. 30 tablet 5   LORazepam (ATIVAN) 0.5 MG tablet TAKE ONE TABLET BY MOUTH TWICE A DAY AS NEEDED FOR ANXIETY 60 tablet 3   amLODipine (NORVASC) 2.5 MG tablet SMARTSIG:1 By Mouth     Fluoxetine HCl, PMDD, 10 MG TABS fluoxetine 10 mg tablet  Take 1 tablet every day by oral route.     hydrochlorothiazide (MICROZIDE) 12.5 MG capsule Take by mouth.     levothyroxine (SYNTHROID) 75 MCG tablet Take by mouth.     loratadine (CLARITIN) 10 MG tablet Take 10 mg by mouth daily as needed for allergies.     Ranibizumab (LUCENTIS IZ) 1 Dose by Intravitreal route every 28 (twenty-eight) days.      No facility-administered medications prior to visit.    ROS: Review of Systems  Constitutional:  Negative for appetite change, fatigue and unexpected weight change.  HENT:  Negative for congestion, nosebleeds, sneezing, sore throat and  trouble swallowing.   Eyes:  Negative for itching and visual disturbance.  Respiratory:  Negative for cough.   Cardiovascular:  Negative for chest pain, palpitations and leg swelling.  Gastrointestinal:  Negative for abdominal distention, blood in stool, diarrhea and nausea.  Genitourinary:  Negative for frequency and hematuria.  Musculoskeletal:  Positive for arthralgias. Negative for back pain, gait problem, joint swelling and neck pain.  Skin:  Negative for rash.  Neurological:  Negative for dizziness, tremors, speech difficulty and weakness.  Psychiatric/Behavioral:  Positive for sleep disturbance. Negative for agitation, dysphoric mood and suicidal ideas. The patient is not nervous/anxious.     Objective:  BP 132/66 (BP Location: Left Arm, Patient Position: Sitting, Cuff Size: Large)   Pulse 75   Temp 97.6 F (36.4 C) (Temporal)   Ht 6\' 2"  (1.88 m)   Wt 172 lb (78 kg)   SpO2 98%   BMI 22.08 kg/m   BP Readings from Last 3 Encounters:  01/29/22 132/66  10/12/21 132/72  07/11/21 120/64    Wt Readings from Last 3 Encounters:  01/29/22 172 lb (78 kg)  10/12/21 166 lb (75.3 kg)  07/11/21 163 lb 6.4 oz (74.1 kg)    Physical Exam Constitutional:      General: He is not in acute distress.    Appearance: He is well-developed.  Comments: NAD  Eyes:     Conjunctiva/sclera: Conjunctivae normal.     Pupils: Pupils are equal, round, and reactive to light.  Neck:     Thyroid: No thyromegaly.     Vascular: No JVD.  Cardiovascular:     Rate and Rhythm: Normal rate and regular rhythm.     Heart sounds: Normal heart sounds. No murmur heard.    No friction rub. No gallop.  Pulmonary:     Effort: Pulmonary effort is normal. No respiratory distress.     Breath sounds: Normal breath sounds. No wheezing or rales.  Chest:     Chest wall: No tenderness.  Abdominal:     General: Bowel sounds are normal. There is no distension.     Palpations: Abdomen is soft. There is no mass.      Tenderness: There is no abdominal tenderness. There is no guarding or rebound.  Musculoskeletal:        General: No tenderness. Normal range of motion.     Cervical back: Normal range of motion.  Lymphadenopathy:     Cervical: No cervical adenopathy.  Skin:    General: Skin is warm and dry.     Findings: No rash.  Neurological:     Mental Status: He is alert and oriented to person, place, and time.     Cranial Nerves: No cranial nerve deficit.     Motor: No abnormal muscle tone.     Coordination: Coordination normal.     Gait: Gait normal.     Deep Tendon Reflexes: Reflexes are normal and symmetric.  Psychiatric:        Behavior: Behavior normal.        Thought Content: Thought content normal.        Judgment: Judgment normal.     Lab Results  Component Value Date   WBC 7.2 04/26/2021   HGB 12.9 (L) 04/26/2021   HCT 39.2 04/26/2021   PLT 232.0 04/26/2021   GLUCOSE 78 12/15/2020   CHOL 114 12/31/2018   TRIG 57.0 12/31/2018   HDL 53.30 12/31/2018   LDLCALC 49 12/31/2018   ALT 22 12/15/2020   AST 23 12/15/2020   NA 139 12/15/2020   K 4.3 12/15/2020   CL 104 12/15/2020   CREATININE 0.97 12/15/2020   BUN 25 (H) 12/15/2020   CO2 27 12/15/2020   TSH 2.98 04/26/2021   PSA 0.6 02/09/2020   INR 1.0 04/08/2020   HGBA1C 5.8 10/26/2010    No results found.  Assessment & Plan:   Problem List Items Addressed This Visit     Balance disorder    Vision dependent issues No  falls      COPD mixed type ([place])    Chronic  F/u wDr Young CXR today      Relevant Orders   DG Chest 2 View   Depressed mood    Cont w/Wellbutrin XL 150 mg daily.       Diplopia    Chronic since 2020      Elevated PSA - Primary   Relevant Orders   PSA   Essential hypertension    Cont on Amlodipine, Benicar      Relevant Orders   TSH   Urinalysis   CBC with Differential/Platelet   Lipid panel   PSA   Comprehensive metabolic panel   T4, free   T3, free   Hemoglobin A1c    Hypothyroidism   Relevant Orders   TSH   Urinalysis   CBC  with Differential/Platelet   Lipid panel   PSA   Comprehensive metabolic panel   T4, free   T3, free   Hemoglobin A1c   TIA (transient ischemic attack)    ASA 162 mg/d      Relevant Orders   TSH   Urinalysis   CBC with Differential/Platelet   Lipid panel   PSA   Comprehensive metabolic panel   T4, free   T3, free   Hemoglobin A1c   Other Visit Diagnoses     Hyperglycemia       Relevant Orders   Hemoglobin A1c         Meds ordered this encounter  Medications   buPROPion (WELLBUTRIN XL) 150 MG 24 hr tablet    Sig: Take 1 tablet (150 mg total) by mouth daily.    Dispense:  90 tablet    Refill:  3   LORazepam (ATIVAN) 0.5 MG tablet    Sig: TAKE ONE TABLET BY MOUTH TWICE A DAY AS NEEDED FOR ANXIETY    Dispense:  60 tablet    Refill:  3      Follow-up: Return in about 4 months (around 05/31/2022) for Wellness Exam.  Walker Kehr, MD

## 2022-01-29 NOTE — Assessment & Plan Note (Signed)
Cont on Amlodipine, Benicar

## 2022-01-30 DIAGNOSIS — H353220 Exudative age-related macular degeneration, left eye, stage unspecified: Secondary | ICD-10-CM | POA: Diagnosis not present

## 2022-01-30 DIAGNOSIS — H532 Diplopia: Secondary | ICD-10-CM | POA: Diagnosis not present

## 2022-01-30 DIAGNOSIS — H02531 Eyelid retraction right upper eyelid: Secondary | ICD-10-CM | POA: Diagnosis not present

## 2022-01-30 DIAGNOSIS — E063 Autoimmune thyroiditis: Secondary | ICD-10-CM | POA: Diagnosis not present

## 2022-01-30 DIAGNOSIS — H02532 Eyelid retraction right lower eyelid: Secondary | ICD-10-CM | POA: Diagnosis not present

## 2022-02-09 DIAGNOSIS — H442E1 Degenerative myopia with other maculopathy, right eye: Secondary | ICD-10-CM | POA: Diagnosis not present

## 2022-02-09 DIAGNOSIS — H353221 Exudative age-related macular degeneration, left eye, with active choroidal neovascularization: Secondary | ICD-10-CM | POA: Diagnosis not present

## 2022-02-09 DIAGNOSIS — H15833 Staphyloma posticum, bilateral: Secondary | ICD-10-CM | POA: Diagnosis not present

## 2022-02-09 DIAGNOSIS — H442A2 Degenerative myopia with choroidal neovascularization, left eye: Secondary | ICD-10-CM | POA: Diagnosis not present

## 2022-02-16 NOTE — Progress Notes (Signed)
Subjective:    Patient ID: David Buck, male    DOB: 04-28-1941, 81 y.o.   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 Bronchoscopy (Dr Loanne Drilling) 04/14/20- BAL-Neg Fungal, Neg AFB, Pos Pseudomonas broadly sens. Had doxycycline prior to procedure, then Levaquin 500 mg x 7 days after. Cytology neg.   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 Intermittent R nostril/ ear congested. Using Afrin/ Sudafed, Flonase, nasal saline rinse. - These discussed and compared.  Little cough now. Imaging discussed.  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  Denies cough. Continues following with ophthalmology for diplopia. CXR 01/30/22- IMPRESSION: Similar patchy irregular opacities right upper lung. Given the persistent nature, as well as the findings on prior chest CT March 24, 2020, recommend further evaluation with additional chest CT to assess for stability.   ROS-see HPI  + = positive Constitutional:   No-   weight loss, night sweats, fevers, chills, fatigue, lassitude. HEENT:   No-  headaches, difficulty swallowing, tooth/dental problems, sore throat,       No-  sneezing, itching, ear ache, +nasal congestion, +post nasal drip,  CV:  No-   chest pain, orthopnea, PND, swelling in lower extremities, anasarca, dizziness, palpitations Resp: No-   shortness of breath with exertion or at rest.             productive cough,  No- non-productive cough,  coughing up of blood.              No-   change in color of mucus.  No- wheezing.   Skin: No-   rash or lesions. GI:  No-   heartburn, indigestion, abdominal pain, nausea, vomiting,  GU:  MS:  No-   joint pain or swelling.  Neuro-     nothing unusual Psych:  No- change in mood or affect. No depression or anxiety.  No memory loss.  Objective:   Physical Exam General- Alert, Oriented, Affect-appropriate, Distress- none acute.+ tall slender man Skin- rash-none, lesions- none, excoriation- none Lymphadenopathy- none Head- atraumatic            Eyes- + patch over R eye.  Ears- Hearing, canals normal            Nose- Clear, No- Septal dev, +mucus postnasal drip, polyps, erosion, perforation             Throat- Mallampati II , mucosa clear , drainage- none, tonsils- atrophic  dentures Neck- flexible , trachea midline, no stridor , thyroid nl, carotid no bruit Chest - symmetrical excursion , unlabored           Heart/CV- RRR , no murmur , no gallop  , no rub, nl s1 s2                           - JVD- none ,  edema- none, stasis changes- none, varices- none           Lung- Clear, unlabored, wheeze- none, cough+slight , dullness-none, rub- none           Chest wall-  Abd-  Br/ Gen/ Rectal- Not done, not indicated Extrem- cyanosis- none, clubbing, none, atrophy- none, strength- nl Neuro-+ mild head bobbing tremor    Assessment & Plan:

## 2022-02-19 ENCOUNTER — Ambulatory Visit (INDEPENDENT_AMBULATORY_CARE_PROVIDER_SITE_OTHER): Payer: Medicare Other | Admitting: Internal Medicine

## 2022-02-19 ENCOUNTER — Encounter: Payer: Self-pay | Admitting: Internal Medicine

## 2022-02-19 VITALS — BP 120/74 | HR 80 | Temp 97.8°F | Ht 74.0 in | Wt 174.0 lb

## 2022-02-19 DIAGNOSIS — Z87891 Personal history of nicotine dependence: Secondary | ICD-10-CM

## 2022-02-19 DIAGNOSIS — J449 Chronic obstructive pulmonary disease, unspecified: Secondary | ICD-10-CM

## 2022-02-19 DIAGNOSIS — R911 Solitary pulmonary nodule: Secondary | ICD-10-CM | POA: Diagnosis not present

## 2022-02-19 NOTE — Patient Instructions (Signed)
We suggested you sign at pharmacy counter for true Sudafed (PSE) 6-12 hour form for occasional use as a decongestant.  Order- referral to [facility] Form- candidate for CT low dose screening program.

## 2022-03-01 ENCOUNTER — Encounter: Payer: Self-pay | Admitting: Internal Medicine

## 2022-03-01 NOTE — Assessment & Plan Note (Signed)
We discussed enrollment in low-dose screening chest CT program.

## 2022-03-01 NOTE — Assessment & Plan Note (Signed)
No active bronchitic symptoms despite long smoking history.  Not using inhalers. Plan-enroll in low-dose screening chest CT program

## 2022-03-09 DIAGNOSIS — H35373 Puckering of macula, bilateral: Secondary | ICD-10-CM | POA: Diagnosis not present

## 2022-03-09 DIAGNOSIS — H353221 Exudative age-related macular degeneration, left eye, with active choroidal neovascularization: Secondary | ICD-10-CM | POA: Diagnosis not present

## 2022-03-15 ENCOUNTER — Ambulatory Visit (INDEPENDENT_AMBULATORY_CARE_PROVIDER_SITE_OTHER): Payer: Medicare Other | Admitting: Internal Medicine

## 2022-03-15 ENCOUNTER — Encounter: Payer: Self-pay | Admitting: Internal Medicine

## 2022-03-15 DIAGNOSIS — K219 Gastro-esophageal reflux disease without esophagitis: Secondary | ICD-10-CM | POA: Diagnosis not present

## 2022-03-15 DIAGNOSIS — R110 Nausea: Secondary | ICD-10-CM | POA: Diagnosis not present

## 2022-03-15 LAB — COMPREHENSIVE METABOLIC PANEL
ALT: 28 U/L (ref 0–53)
AST: 26 U/L (ref 0–37)
Albumin: 4.3 g/dL (ref 3.5–5.2)
Alkaline Phosphatase: 85 U/L (ref 39–117)
BUN: 25 mg/dL — ABNORMAL HIGH (ref 6–23)
CO2: 30 mEq/L (ref 19–32)
Calcium: 10.2 mg/dL (ref 8.4–10.5)
Chloride: 102 mEq/L (ref 96–112)
Creatinine, Ser: 1.12 mg/dL (ref 0.40–1.50)
GFR: 62.21 mL/min (ref >60.00)
Glucose, Bld: 94 mg/dL (ref 70–99)
Potassium: 4.6 mEq/L (ref 3.5–5.1)
Sodium: 137 mEq/L (ref 135–145)
Total Bilirubin: 0.5 mg/dL (ref 0.2–1.2)
Total Protein: 7.5 g/dL (ref 6.0–8.3)

## 2022-03-15 LAB — CBC WITH DIFFERENTIAL/PLATELET
Basophils Absolute: 0 10*3/uL (ref 0.0–0.1)
Basophils Relative: 0.6 % (ref 0.0–3.0)
Eosinophils Absolute: 0.5 10*3/uL (ref 0.0–0.7)
Eosinophils Relative: 6.6 % — ABNORMAL HIGH (ref 0.0–5.0)
HCT: 39.3 % (ref 39.0–52.0)
Hemoglobin: 13.2 g/dL (ref 13.0–17.0)
Lymphocytes Relative: 14.7 % (ref 12.0–46.0)
Lymphs Abs: 1.2 10*3/uL (ref 0.7–4.0)
MCHC: 33.5 g/dL (ref 30.0–36.0)
MCV: 94.1 fl (ref 78.0–100.0)
Monocytes Absolute: 0.8 10*3/uL (ref 0.1–1.0)
Monocytes Relative: 9.5 % (ref 3.0–12.0)
Neutro Abs: 5.6 10*3/uL (ref 1.4–7.7)
Neutrophils Relative %: 68.6 % (ref 43.0–77.0)
Platelets: 264 10*3/uL (ref 150.0–400.0)
RBC: 4.18 Mil/uL — ABNORMAL LOW (ref 4.22–5.81)
RDW: 13.1 % (ref 11.5–15.5)
WBC: 8.2 10*3/uL (ref 4.0–10.5)

## 2022-03-15 LAB — LIPASE: Lipase: 34 U/L (ref 11.0–59.0)

## 2022-03-15 MED ORDER — ONDANSETRON HCL 4 MG PO TABS: 4.0000 mg | ORAL_TABLET | Freq: Three times a day (TID) | ORAL | 0 refills | Status: DC | PRN

## 2022-03-15 MED ORDER — PANTOPRAZOLE SODIUM 40 MG PO TBEC: 40.0000 mg | DELAYED_RELEASE_TABLET | Freq: Every day | ORAL | 5 refills | Status: DC

## 2022-03-15 NOTE — Assessment & Plan Note (Addendum)
New GERD, burping, nausea, dry heaves - 2 weeks. C/o fullness. Poor appetite Start Protonix, prn Zofran Labs Abd CT, endoscopy. Discussed

## 2022-03-15 NOTE — Assessment & Plan Note (Addendum)
New GERD vs gastritis, burping, nausea, dry heaves - 2 weeks. C/o fullness. Poor appetite Start Protonix, prn Zofran Labs Consider abd CT, endoscopy. Discussed

## 2022-03-15 NOTE — Progress Notes (Signed)
Subjective:  Patient ID: David Buck, male    DOB: 10-17-1940  Age: 81 y.o. MRN: [id number]  CC: GI Problem   HPI David Buck presents for GERD, burping, nausea, dry heaves - 2 weeks. C/o fullness. Poor appetite  Outpatient Medications Prior to Visit  Medication Sig Dispense Refill   buPROPion (WELLBUTRIN XL) 150 MG 24 hr tablet Take 1 tablet (150 mg total) by mouth daily. 90 tablet 3   Cholecalciferol (VITAMIN D3) 50 MCG (2000 UT) TABS Take 2,000 Units by mouth every evening.     faricimab-svoa (VABYSMO) 6 MG/0.05ML SOLN intravitreal injection 6 mg by Intravitreal route once.     GLUCOSAMINE RELIEF 1000 MG TABS SMARTSIG:1 By Mouth     loratadine (CLARITIN) 10 MG tablet Take 1 tablet by mouth daily.     LORazepam (ATIVAN) 0.5 MG tablet TAKE ONE TABLET BY MOUTH TWICE A DAY AS NEEDED FOR ANXIETY 60 tablet 3   Multiple Vitamin (MULTIVITAMIN WITH MINERALS) TABS tablet Take 1 tablet by mouth every evening.     olmesartan (BENICAR) 20 MG tablet TAKE ONE TABLET BY MOUTH DAILY 90 tablet 2   Polyethyl Glycol-Propyl Glycol (SYSTANE ULTRA) 0.4-0.3 % SOLN Place 1 drop into both eyes every evening.     ZINC PICOLINATE PO Take 22 mg by mouth every evening.     Ascorbic Acid 500 MG CAPS Take by mouth.     No facility-administered medications prior to visit.    ROS: Review of Systems  Constitutional:  Negative for appetite change, fatigue and unexpected weight change.  HENT:  Negative for congestion, nosebleeds, sneezing, sore throat and trouble swallowing.   Eyes:  Negative for itching and visual disturbance.  Respiratory:  Negative for cough.   Cardiovascular:  Negative for chest pain, palpitations and leg swelling.  Gastrointestinal:  Positive for abdominal distention and nausea. Negative for blood in stool, diarrhea and vomiting.  Genitourinary:  Negative for frequency and hematuria.  Musculoskeletal:  Negative for back pain, gait problem, joint swelling and neck pain.  Skin:   Negative for rash.  Neurological:  Negative for dizziness, tremors, speech difficulty and weakness.  Psychiatric/Behavioral:  Negative for agitation, dysphoric mood and sleep disturbance. The patient is not nervous/anxious.     Objective:  BP 102/62 (BP Location: Left Arm)   Pulse 71   Temp 98.1 F (36.7 C) (Oral)   Ht 6\' 2"  (1.88 m)   Wt 173 lb 9.6 oz (78.7 kg)   SpO2 94%   BMI 22.29 kg/m   BP Readings from Last 3 Encounters:  03/15/22 102/62  02/19/22 120/74  01/29/22 132/66    Wt Readings from Last 3 Encounters:  03/15/22 173 lb 9.6 oz (78.7 kg)  02/19/22 174 lb (78.9 kg)  01/29/22 172 lb (78 kg)    Physical Exam Constitutional:      General: He is not in acute distress.    Appearance: Normal appearance. He is well-developed.     Comments: NAD  Eyes:     Conjunctiva/sclera: Conjunctivae normal.     Pupils: Pupils are equal, round, and reactive to light.  Neck:     Thyroid: No thyromegaly.     Vascular: No JVD.  Cardiovascular:     Rate and Rhythm: Normal rate and regular rhythm.     Heart sounds: Normal heart sounds. No murmur heard.    No friction rub. No gallop.  Pulmonary:     Effort: Pulmonary effort is normal. No respiratory distress.  Breath sounds: Normal breath sounds. No wheezing or rales.  Chest:     Chest wall: No tenderness.  Abdominal:     General: Bowel sounds are normal. There is no distension.     Palpations: Abdomen is soft. There is no mass.     Tenderness: There is no abdominal tenderness. There is no guarding or rebound.  Musculoskeletal:        General: No tenderness. Normal range of motion.     Cervical back: Normal range of motion.  Lymphadenopathy:     Cervical: No cervical adenopathy.  Skin:    General: Skin is warm and dry.     Findings: No rash.  Neurological:     Mental Status: He is alert and oriented to person, place, and time.     Cranial Nerves: No cranial nerve deficit.     Motor: No abnormal muscle tone.      Coordination: Coordination normal.     Gait: Gait normal.     Deep Tendon Reflexes: Reflexes are normal and symmetric.  Psychiatric:        Behavior: Behavior normal.        Thought Content: Thought content normal.        Judgment: Judgment normal.     Lab Results  Component Value Date   WBC 7.2 04/26/2021   HGB 12.9 (L) 04/26/2021   HCT 39.2 04/26/2021   PLT 232.0 04/26/2021   GLUCOSE 78 12/15/2020   CHOL 114 12/31/2018   TRIG 57.0 12/31/2018   HDL 53.30 12/31/2018   LDLCALC 49 12/31/2018   ALT 22 12/15/2020   AST 23 12/15/2020   NA 139 12/15/2020   K 4.3 12/15/2020   CL 104 12/15/2020   CREATININE 0.97 12/15/2020   BUN 25 (H) 12/15/2020   CO2 27 12/15/2020   TSH 2.98 04/26/2021   PSA 0.6 02/09/2020   INR 1.0 04/08/2020   HGBA1C 5.8 10/26/2010    No results found.  Assessment & Plan:   Problem List Items Addressed This Visit     GERD (gastroesophageal reflux disease)    New GERD, burping, nausea, dry heaves - 2 weeks. C/o fullness. Poor appetite Start Protonix, prn Zofran Labs Consider abd CT, endoscopy. Discussed      Relevant Medications   pantoprazole (PROTONIX) 40 MG tablet   ondansetron (ZOFRAN) 4 MG tablet   Other Relevant Orders   CBC with Differential/Platelet   Comprehensive metabolic panel   Urinalysis   Lipase   H. pylori antibody, IgG   Nausea    New GERD, burping, nausea, dry heaves - 2 weeks. C/o fullness. Poor appetite Start Protonix, prn Zofran Labs Consider abd CT, endoscopy. Discussed      Relevant Medications   pantoprazole (PROTONIX) 40 MG tablet   ondansetron (ZOFRAN) 4 MG tablet   Other Relevant Orders   CBC with Differential/Platelet   Comprehensive metabolic panel   Urinalysis   Lipase   H. pylori antibody, IgG      Meds ordered this encounter  Medications   pantoprazole (PROTONIX) 40 MG tablet    Sig: Take 1 tablet (40 mg total) by mouth daily.    Dispense:  30 tablet    Refill:  5   ondansetron (ZOFRAN) 4 MG  tablet    Sig: Take 1 tablet (4 mg total) by mouth every 8 (eight) hours as needed for nausea or vomiting.    Dispense:  20 tablet    Refill:  0  Follow-up: No follow-ups on file.  Walker Kehr, MD

## 2022-03-16 LAB — URINALYSIS
Bilirubin Urine: NEGATIVE
Hgb urine dipstick: NEGATIVE
Ketones, ur: NEGATIVE
Leukocytes,Ua: NEGATIVE
Nitrite: NEGATIVE
Specific Gravity, Urine: 1.015 (ref 1.000–1.030)
Total Protein, Urine: NEGATIVE
Urine Glucose: NEGATIVE
Urobilinogen, UA: 0.2 (ref 0.0–1.0)
pH: 6.5 (ref 5.0–8.0)

## 2022-03-16 LAB — H. PYLORI ANTIBODY, IGG: H Pylori IgG: NEGATIVE

## 2022-03-21 ENCOUNTER — Telehealth: Payer: Self-pay | Admitting: Internal Medicine

## 2022-03-21 DIAGNOSIS — N400 Enlarged prostate without lower urinary tract symptoms: Secondary | ICD-10-CM | POA: Diagnosis not present

## 2022-03-21 NOTE — Telephone Encounter (Signed)
Patient has an appointment with [facility] at 2:45 today - he would like a copy of his last blood work and urinalysis faxed to their office.  Fax:  [phone]

## 2022-03-21 NOTE — Telephone Encounter (Signed)
Faxed labs to [facility].Marland KitchenJohny Chess

## 2022-04-02 DIAGNOSIS — R0989 Other specified symptoms and signs involving the circulatory and respiratory systems: Secondary | ICD-10-CM | POA: Diagnosis not present

## 2022-04-02 DIAGNOSIS — R051 Acute cough: Secondary | ICD-10-CM | POA: Diagnosis not present

## 2022-04-02 DIAGNOSIS — Z20822 Contact with and (suspected) exposure to covid-19: Secondary | ICD-10-CM | POA: Diagnosis not present

## 2022-04-02 DIAGNOSIS — R000 Tachycardia, unspecified: Secondary | ICD-10-CM | POA: Diagnosis not present

## 2022-04-02 DIAGNOSIS — R059 Cough, unspecified: Secondary | ICD-10-CM | POA: Diagnosis not present

## 2022-04-03 ENCOUNTER — Telehealth: Payer: Self-pay

## 2022-04-03 NOTE — Telephone Encounter (Signed)
Patient is in hospital with pneumonia and called about [facility] referral by Dr. Annamaria Boots.  Patient is 81 years of age and insurance would not accept an order for LDCT due to ineligible.  Patient plans to get CT ordered by NP caring for his pneumonia.  Advised that would be indicated as diagnostic CT Chest and if patient wishes to continue a yearly CT chest, he can discuss with Dr. Annamaria Boots. Patient acknowledged understanding.  Referral to [facility] was closed.

## 2022-04-06 DIAGNOSIS — J189 Pneumonia, unspecified organism: Secondary | ICD-10-CM | POA: Diagnosis not present

## 2022-04-09 ENCOUNTER — Telehealth: Payer: Self-pay | Admitting: Internal Medicine

## 2022-04-09 MED ORDER — DOXYCYCLINE HYCLATE 100 MG PO TABS: 100.0000 mg | ORAL_TABLET | Freq: Two times a day (BID) | ORAL | 0 refills | Status: DC

## 2022-04-09 NOTE — Telephone Encounter (Signed)
Called and spoke with the pt  He states he was dxed with PNA by PCP on 04/02/22  He was prescribed Augmentin, took this for 4 days and continued to have fevers and sweats and cough with dark green sputum They told him to d/c augmentin and take levaquin 750 for 5 days  He has 1 day left on this, and has a refill available if needed  He feels improved overall, but still has fatigue and sputum is clear with traces of light green He is unsure if he wants to refill the levaquin  bc he feels lightheaded and BP low for approx 1 hour after he takes this  Please advise, thanks!  Allergies  Allergen Reactions   Cefdinir Rash   Cephalosporins Rash

## 2022-04-09 NOTE — Telephone Encounter (Signed)
I called and spoke with the pt and notified of response per Dr Annamaria Boots  He verbalized understanding  Rx was sent to preferred pharm  Nothing further needed

## 2022-04-09 NOTE — Telephone Encounter (Signed)
Instead of the additional levaquin, can you please send doxycycline 100 mg, # 14, 1 twice daily. Hope he feels better.

## 2022-04-09 NOTE — Telephone Encounter (Signed)
Alternative medication md young would send in .  Ph[facility]: [facility] on [address]

## 2022-04-10 DIAGNOSIS — H43813 Vitreous degeneration, bilateral: Secondary | ICD-10-CM | POA: Diagnosis not present

## 2022-04-10 DIAGNOSIS — H353221 Exudative age-related macular degeneration, left eye, with active choroidal neovascularization: Secondary | ICD-10-CM | POA: Diagnosis not present

## 2022-04-10 DIAGNOSIS — H35373 Puckering of macula, bilateral: Secondary | ICD-10-CM | POA: Diagnosis not present

## 2022-04-10 DIAGNOSIS — H442A2 Degenerative myopia with choroidal neovascularization, left eye: Secondary | ICD-10-CM | POA: Diagnosis not present

## 2022-04-17 ENCOUNTER — Encounter: Payer: Self-pay | Admitting: Internal Medicine

## 2022-04-17 ENCOUNTER — Ambulatory Visit (INDEPENDENT_AMBULATORY_CARE_PROVIDER_SITE_OTHER): Payer: Medicare Other | Admitting: Internal Medicine

## 2022-04-17 DIAGNOSIS — K219 Gastro-esophageal reflux disease without esophagitis: Secondary | ICD-10-CM

## 2022-04-17 DIAGNOSIS — R911 Solitary pulmonary nodule: Secondary | ICD-10-CM

## 2022-04-17 DIAGNOSIS — I10 Essential (primary) hypertension: Secondary | ICD-10-CM | POA: Diagnosis not present

## 2022-04-17 DIAGNOSIS — R110 Nausea: Secondary | ICD-10-CM

## 2022-04-17 DIAGNOSIS — J189 Pneumonia, unspecified organism: Secondary | ICD-10-CM

## 2022-04-17 MED ORDER — ALBUTEROL SULFATE HFA 108 (90 BASE) MCG/ACT IN AERS: 2.0000 | INHALATION_SPRAY | RESPIRATORY_TRACT | 3 refills | Status: DC | PRN

## 2022-04-17 NOTE — Assessment & Plan Note (Signed)
Chest CT pending

## 2022-04-17 NOTE — Assessment & Plan Note (Signed)
Resolved

## 2022-04-17 NOTE — Assessment & Plan Note (Signed)
Chest CT is pending

## 2022-04-17 NOTE — Assessment & Plan Note (Signed)
Off Amlodipine On Benicar

## 2022-04-17 NOTE — Progress Notes (Signed)
Subjective:  Patient ID: David Buck, male    DOB: 15-Jan-1941  Age: 81 y.o. MRN: [id number]  CC: Follow-up (4 week f/u)   HPI David Buck presents for CAP, GI upset - resolved, abn CXR  Outpatient Medications Prior to Visit  Medication Sig Dispense Refill   Ascorbic Acid (VITAMIN C WITH ROSE HIPS) 500 MG tablet Take 1 by mouth daily     buPROPion (WELLBUTRIN XL) 150 MG 24 hr tablet Take 1 tablet (150 mg total) by mouth daily. 90 tablet 3   Cholecalciferol (VITAMIN D3) 50 MCG (2000 UT) TABS Take 2,000 Units by mouth every evening.     faricimab-svoa (VABYSMO) 6 MG/0.05ML SOLN intravitreal injection 6 mg by Intravitreal route once.     GLUCOSAMINE RELIEF 1000 MG TABS SMARTSIG:1 By Mouth     loratadine (CLARITIN) 10 MG tablet Take 1 tablet by mouth daily.     LORazepam (ATIVAN) 0.5 MG tablet TAKE ONE TABLET BY MOUTH TWICE A DAY AS NEEDED FOR ANXIETY 60 tablet 3   Multiple Vitamin (MULTIVITAMIN WITH MINERALS) TABS tablet Take 1 tablet by mouth every evening.     olmesartan (BENICAR) 20 MG tablet TAKE ONE TABLET BY MOUTH DAILY 90 tablet 2   ondansetron (ZOFRAN) 4 MG tablet Take 1 tablet (4 mg total) by mouth every 8 (eight) hours as needed for nausea or vomiting. 20 tablet 0   pantoprazole (PROTONIX) 40 MG tablet Take 1 tablet (40 mg total) by mouth daily. 30 tablet 5   Polyethyl Glycol-Propyl Glycol (SYSTANE ULTRA) 0.4-0.3 % SOLN Place 1 drop into both eyes every evening.     ZINC PICOLINATE PO Take 22 mg by mouth every evening.     doxycycline (VIBRA-TABS) 100 MG tablet Take 1 tablet (100 mg total) by mouth 2 (two) times daily. 14 tablet 0   No facility-administered medications prior to visit.    ROS: Review of Systems  Constitutional:  Negative for appetite change, fatigue and unexpected weight change.  HENT:  Negative for congestion, nosebleeds, sneezing, sore throat and trouble swallowing.   Eyes:  Negative for itching and visual disturbance.  Respiratory:  Negative for  cough.   Cardiovascular:  Negative for chest pain, palpitations and leg swelling.  Gastrointestinal:  Negative for abdominal distention, blood in stool, constipation, diarrhea, nausea and vomiting.  Genitourinary:  Negative for frequency and hematuria.  Musculoskeletal:  Negative for back pain, gait problem, joint swelling and neck pain.  Skin:  Negative for rash.  Neurological:  Negative for dizziness, tremors, speech difficulty and weakness.  Psychiatric/Behavioral:  Negative for agitation, dysphoric mood and sleep disturbance. The patient is not nervous/anxious.     Objective:  BP 100/62 (BP Location: Left Arm)   Pulse 77   Temp 97.9 F (36.6 C) (Oral)   Ht 6\' 2"  (1.88 m)   Wt 171 lb 9.6 oz (77.8 kg)   SpO2 93%   BMI 22.03 kg/m   BP Readings from Last 3 Encounters:  04/17/22 100/62  03/15/22 102/62  02/19/22 120/74    Wt Readings from Last 3 Encounters:  04/17/22 171 lb 9.6 oz (77.8 kg)  03/15/22 173 lb 9.6 oz (78.7 kg)  02/19/22 174 lb (78.9 kg)    Physical Exam Constitutional:      General: He is not in acute distress.    Appearance: He is well-developed.     Comments: NAD  Eyes:     Conjunctiva/sclera: Conjunctivae normal.     Pupils: Pupils are equal, round,  and reactive to light.  Neck:     Thyroid: No thyromegaly.     Vascular: No JVD.  Cardiovascular:     Rate and Rhythm: Normal rate and regular rhythm.     Heart sounds: Normal heart sounds. No murmur heard.    No friction rub. No gallop.  Pulmonary:     Effort: Pulmonary effort is normal. No respiratory distress.     Breath sounds: Normal breath sounds. No wheezing or rales.  Chest:     Chest wall: No tenderness.  Abdominal:     General: Bowel sounds are normal. There is no distension.     Palpations: Abdomen is soft. There is no mass.     Tenderness: There is no abdominal tenderness. There is no guarding or rebound.  Musculoskeletal:        General: No tenderness. Normal range of motion.      Cervical back: Normal range of motion.  Lymphadenopathy:     Cervical: No cervical adenopathy.  Skin:    General: Skin is warm and dry.     Findings: No rash.  Neurological:     Mental Status: He is alert and oriented to person, place, and time.     Cranial Nerves: No cranial nerve deficit.     Motor: No abnormal muscle tone.     Coordination: Coordination normal.     Gait: Gait normal.     Deep Tendon Reflexes: Reflexes are normal and symmetric.  Psychiatric:        Behavior: Behavior normal.        Thought Content: Thought content normal.        Judgment: Judgment normal.   Mild wheezing  Lab Results  Component Value Date   WBC 8.2 03/15/2022   HGB 13.2 03/15/2022   HCT 39.3 03/15/2022   PLT 264.0 03/15/2022   GLUCOSE 94 03/15/2022   CHOL 114 12/31/2018   TRIG 57.0 12/31/2018   HDL 53.30 12/31/2018   LDLCALC 49 12/31/2018   ALT 28 03/15/2022   AST 26 03/15/2022   NA 137 03/15/2022   K 4.6 03/15/2022   CL 102 03/15/2022   CREATININE 1.12 03/15/2022   BUN 25 (H) 03/15/2022   CO2 30 03/15/2022   TSH 2.98 04/26/2021   PSA 0.6 02/09/2020   INR 1.0 04/08/2020   HGBA1C 5.8 10/26/2010    No results found.  Assessment & Plan:   Problem List Items Addressed This Visit     CAP (community acquired pneumonia)    Chest CT pending      Essential hypertension    Off Amlodipine On Benicar      GERD (gastroesophageal reflux disease)    Resolved      Lung nodule    Chest CT is pending      Nausea    Resolved         No orders of the defined types were placed in this encounter.     Follow-up: Return in about 6 months (around 10/17/2022) for a follow-up visit.  Walker Kehr, MD

## 2022-04-20 DIAGNOSIS — J439 Emphysema, unspecified: Secondary | ICD-10-CM | POA: Diagnosis not present

## 2022-04-20 DIAGNOSIS — I2510 Atherosclerotic heart disease of native coronary artery without angina pectoris: Secondary | ICD-10-CM | POA: Diagnosis not present

## 2022-04-20 DIAGNOSIS — R918 Other nonspecific abnormal finding of lung field: Secondary | ICD-10-CM | POA: Diagnosis not present

## 2022-04-20 DIAGNOSIS — I700 Atherosclerosis of aorta: Secondary | ICD-10-CM | POA: Diagnosis not present

## 2022-04-30 ENCOUNTER — Encounter: Payer: Self-pay | Admitting: Internal Medicine

## 2022-04-30 NOTE — Telephone Encounter (Signed)
I agree with your choice to repeat a CT in 3 months.

## 2022-05-07 ENCOUNTER — Telehealth: Payer: Self-pay | Admitting: Internal Medicine

## 2022-05-08 NOTE — Telephone Encounter (Signed)
Spoke with [name] (pt's wife per [facility]) and reviewed My Chart response from Dr. Annamaria Boots. [name] states pt has an OV on Monday and pt will be keeping. Nothing further needed at this time.

## 2022-05-11 NOTE — Progress Notes (Signed)
Subjective:    Patient ID: David Buck, male    DOB: 02/14/1941, 81 y.o.   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 Bronchoscopy (Dr Loanne Drilling) 04/14/20- BAL-Neg Fungal, Neg AFB, Pos Pseudomonas broadly sens. Had doxycycline prior to procedure, then Levaquin 500 mg x 7 days after. Cytology neg.   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  Denies cough. Continues following with ophthalmology for diplopia. CXR 01/30/22- IMPRESSION: Similar patchy irregular opacities right upper lung. Given the persistent nature, as well as the findings on prior chest CT March 24, 2020, recommend further evaluation with additional chest CT to assess for stability.  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 CT chest at [facility] shows old scarring, but a new 13 mm spiculated cavitary nodule RUL Have discussed evaluation. Will order PET. Minimal dry cough. No nodes, blood, fever or chest pain.  ROS-see HPI  + = positive Constitutional:   No-   weight loss, night sweats, fevers, chills, fatigue, lassitude. HEENT:   No-  headaches, difficulty swallowing, tooth/dental problems, sore throat,       No-  sneezing, itching, ear ache, +nasal congestion, +post nasal drip,  CV:  No-   chest pain, orthopnea, PND, swelling in lower extremities, anasarca, dizziness, palpitations Resp: No-   shortness of breath with exertion or at rest.             productive cough,  No- non-productive cough,  coughing up of blood.              No-   change in color of mucus.  No- wheezing.   Skin: No-   rash or lesions. GI:  No-   heartburn, indigestion, abdominal pain, nausea, vomiting,  GU:  MS:  No-   joint pain or swelling.  Neuro-     nothing unusual Psych:  No- change in mood or affect. No depression or anxiety.  No memory loss.  Objective:   Physical Exam General- Alert, Oriented, Affect-appropriate, Distress- none acute.+ tall slender man Skin- rash-none, lesions- none, excoriation- none Lymphadenopathy- none Head- atraumatic  Eyes- + patch over R eye for diplopia            Ears- Hearing, canals normal            Nose- Clear, No- Septal dev, +mucus postnasal drip, polyps, erosion, perforation             Throat- Mallampati II , mucosa clear , drainage- none, tonsils- atrophic   dentures Neck- flexible , trachea midline, no stridor , thyroid nl, carotid no bruit Chest - symmetrical excursion , unlabored           Heart/CV- RRR , no murmur , no gallop  , no rub, nl s1 s2                           - JVD- none , edema- none, stasis changes- none, varices- none           Lung- Clear, unlabored, wheeze- none, cough+slight , dullness-none, rub- none           Chest wall-  Abd-  Br/ Gen/ Rectal- Not done, not indicated Extrem- cyanosis- none, clubbing, none, atrophy- none, strength- nl Neuro-+ mild head bobbing tremor    Assessment & Plan:

## 2022-05-14 ENCOUNTER — Ambulatory Visit (INDEPENDENT_AMBULATORY_CARE_PROVIDER_SITE_OTHER): Payer: Medicare Other | Admitting: Internal Medicine

## 2022-05-14 ENCOUNTER — Encounter: Payer: Self-pay | Admitting: Internal Medicine

## 2022-05-14 VITALS — BP 122/76 | HR 74 | Ht 74.0 in | Wt 174.2 lb

## 2022-05-14 DIAGNOSIS — R911 Solitary pulmonary nodule: Secondary | ICD-10-CM | POA: Diagnosis not present

## 2022-05-14 DIAGNOSIS — J449 Chronic obstructive pulmonary disease, unspecified: Secondary | ICD-10-CM

## 2022-05-14 NOTE — Patient Instructions (Addendum)
Order- PET scan neck to thigh     RUL nodule on [facility] chest CT   I will be referring you to one of our lung nodule experts next week.  Schedule PFT dx COPD mixed type

## 2022-05-22 ENCOUNTER — Other Ambulatory Visit (INDEPENDENT_AMBULATORY_CARE_PROVIDER_SITE_OTHER): Payer: Medicare Other

## 2022-05-22 DIAGNOSIS — R972 Elevated prostate specific antigen [PSA]: Secondary | ICD-10-CM | POA: Diagnosis not present

## 2022-05-22 DIAGNOSIS — H353221 Exudative age-related macular degeneration, left eye, with active choroidal neovascularization: Secondary | ICD-10-CM | POA: Diagnosis not present

## 2022-05-22 DIAGNOSIS — I10 Essential (primary) hypertension: Secondary | ICD-10-CM

## 2022-05-22 DIAGNOSIS — R739 Hyperglycemia, unspecified: Secondary | ICD-10-CM

## 2022-05-22 DIAGNOSIS — G459 Transient cerebral ischemic attack, unspecified: Secondary | ICD-10-CM

## 2022-05-22 DIAGNOSIS — E034 Atrophy of thyroid (acquired): Secondary | ICD-10-CM | POA: Diagnosis not present

## 2022-05-22 LAB — COMPREHENSIVE METABOLIC PANEL
ALT: 18 U/L (ref 0–53)
AST: 19 U/L (ref 0–37)
Albumin: 4.4 g/dL (ref 3.5–5.2)
Alkaline Phosphatase: 76 U/L (ref 39–117)
BUN: 23 mg/dL (ref 6–23)
CO2: 31 mEq/L (ref 19–32)
Calcium: 9.9 mg/dL (ref 8.4–10.5)
Chloride: 104 mEq/L (ref 96–112)
Creatinine, Ser: 1.06 mg/dL (ref 0.40–1.50)
GFR: 66.37 mL/min (ref >60.00)
Glucose, Bld: 85 mg/dL (ref 70–99)
Potassium: 4.6 mEq/L (ref 3.5–5.1)
Sodium: 140 mEq/L (ref 135–145)
Total Bilirubin: 0.6 mg/dL (ref 0.2–1.2)
Total Protein: 6.8 g/dL (ref 6.0–8.3)

## 2022-05-22 LAB — T4, FREE: Free T4: 0.82 ng/dL (ref 0.60–1.60)

## 2022-05-22 LAB — LIPID PANEL
Cholesterol: 144 mg/dL (ref 0–200)
HDL: 65 mg/dL (ref >39.00)
LDL Cholesterol: 68 mg/dL (ref 0–99)
NonHDL: 79.25
Total CHOL/HDL Ratio: 2
Triglycerides: 56 mg/dL (ref 0.0–149.0)
VLDL: 11.2 mg/dL (ref 0.0–40.0)

## 2022-05-22 LAB — TSH: TSH: 4.58 u[IU]/mL (ref 0.35–5.50)

## 2022-05-22 LAB — T3, FREE: T3, Free: 3.7 pg/mL (ref 2.3–4.2)

## 2022-05-22 LAB — PSA: PSA: 3.08 ng/mL (ref 0.10–4.00)

## 2022-05-22 LAB — HEMOGLOBIN A1C: Hgb A1c MFr Bld: 6 % (ref 4.6–6.5)

## 2022-05-23 LAB — CBC WITH DIFFERENTIAL/PLATELET
Basophils Absolute: 0 10*3/uL (ref 0.0–0.1)
Basophils Relative: 0.6 % (ref 0.0–3.0)
Eosinophils Absolute: 0.3 10*3/uL (ref 0.0–0.7)
Eosinophils Relative: 4.3 % (ref 0.0–5.0)
HCT: 39 % (ref 39.0–52.0)
Hemoglobin: 12.8 g/dL — ABNORMAL LOW (ref 13.0–17.0)
Lymphocytes Relative: 17.4 % (ref 12.0–46.0)
Lymphs Abs: 1.2 10*3/uL (ref 0.7–4.0)
MCHC: 32.7 g/dL (ref 30.0–36.0)
MCV: 94.2 fl (ref 78.0–100.0)
Monocytes Absolute: 0.6 10*3/uL (ref 0.1–1.0)
Monocytes Relative: 9.2 % (ref 3.0–12.0)
Neutro Abs: 4.6 10*3/uL (ref 1.4–7.7)
Neutrophils Relative %: 68.5 % (ref 43.0–77.0)
Platelets: 252 10*3/uL (ref 150.0–400.0)
RBC: 4.14 Mil/uL — ABNORMAL LOW (ref 4.22–5.81)
RDW: 13.5 % (ref 11.5–15.5)
WBC: 6.7 10*3/uL (ref 4.0–10.5)

## 2022-05-23 LAB — URINALYSIS
Bilirubin Urine: NEGATIVE
Hgb urine dipstick: NEGATIVE
Ketones, ur: NEGATIVE
Leukocytes,Ua: NEGATIVE
Nitrite: NEGATIVE
Specific Gravity, Urine: 1.01 (ref 1.000–1.030)
Total Protein, Urine: NEGATIVE
Urine Glucose: NEGATIVE
Urobilinogen, UA: 0.2 (ref 0.0–1.0)
pH: 7 (ref 5.0–8.0)

## 2022-05-28 ENCOUNTER — Ambulatory Visit (INDEPENDENT_AMBULATORY_CARE_PROVIDER_SITE_OTHER): Payer: Medicare Other | Admitting: Internal Medicine

## 2022-05-28 ENCOUNTER — Encounter: Payer: Self-pay | Admitting: Internal Medicine

## 2022-05-28 VITALS — BP 120/72 | HR 67 | Temp 97.9°F | Ht 74.0 in | Wt 176.0 lb

## 2022-05-28 DIAGNOSIS — R918 Other nonspecific abnormal finding of lung field: Secondary | ICD-10-CM | POA: Diagnosis not present

## 2022-05-28 DIAGNOSIS — J449 Chronic obstructive pulmonary disease, unspecified: Secondary | ICD-10-CM | POA: Diagnosis not present

## 2022-05-28 DIAGNOSIS — K219 Gastro-esophageal reflux disease without esophagitis: Secondary | ICD-10-CM | POA: Diagnosis not present

## 2022-05-28 DIAGNOSIS — R9389 Abnormal findings on diagnostic imaging of other specified body structures: Secondary | ICD-10-CM

## 2022-05-28 DIAGNOSIS — R911 Solitary pulmonary nodule: Secondary | ICD-10-CM | POA: Insufficient documentation

## 2022-05-28 NOTE — Assessment & Plan Note (Signed)
Use Protonix prn, prn Zofran

## 2022-05-28 NOTE — Assessment & Plan Note (Addendum)
F/u w/Pulmonary Med CT PET is pending in Feb 2024 Therapy has not been smoking

## 2022-05-28 NOTE — Assessment & Plan Note (Addendum)
F/u w/Pulmonary Med CT PET is pending in Feb 2024

## 2022-05-28 NOTE — Assessment & Plan Note (Signed)
Not using [facility]

## 2022-05-28 NOTE — Progress Notes (Signed)
Subjective:  Patient ID: David Buck, male    DOB: July 19, 1940  Age: 81 y.o. MRN: [id number]  CC: Follow-up   HPI David Buck presents for lung nodule F/u of GERD, HTN  Outpatient Medications Prior to Visit  Medication Sig Dispense Refill   albuterol (VENTOLIN HFA) 108 (90 Base) MCG/ACT inhaler Inhale 2 puffs into the lungs every 4 (four) hours as needed for wheezing or shortness of breath. 8 g 3   Ascorbic Acid (VITAMIN C WITH ROSE HIPS) 500 MG tablet Take 1 by mouth daily     buPROPion (WELLBUTRIN XL) 150 MG 24 hr tablet Take 1 tablet (150 mg total) by mouth daily. 90 tablet 3   Cholecalciferol (VITAMIN D3) 50 MCG (2000 UT) TABS Take 2,000 Units by mouth every evening.     faricimab-svoa (VABYSMO) 6 MG/0.05ML SOLN intravitreal injection 6 mg by Intravitreal route once.     GLUCOSAMINE RELIEF 1000 MG TABS SMARTSIG:1 By Mouth     loratadine (CLARITIN) 10 MG tablet Take 1 tablet by mouth daily.     LORazepam (ATIVAN) 0.5 MG tablet TAKE ONE TABLET BY MOUTH TWICE A DAY AS NEEDED FOR ANXIETY 60 tablet 3   Multiple Vitamin (MULTIVITAMIN WITH MINERALS) TABS tablet Take 1 tablet by mouth every evening.     olmesartan (BENICAR) 20 MG tablet TAKE ONE TABLET BY MOUTH DAILY 90 tablet 2   ondansetron (ZOFRAN) 4 MG tablet Take 1 tablet (4 mg total) by mouth every 8 (eight) hours as needed for nausea or vomiting. 20 tablet 0   pantoprazole (PROTONIX) 40 MG tablet Take 1 tablet (40 mg total) by mouth daily. 30 tablet 5   Polyethyl Glycol-Propyl Glycol (SYSTANE ULTRA) 0.4-0.3 % SOLN Place 1 drop into both eyes every evening.     ZINC PICOLINATE PO Take 22 mg by mouth every evening.     No facility-administered medications prior to visit.    ROS: Review of Systems  Constitutional:  Positive for fatigue. Negative for appetite change and unexpected weight change.  HENT:  Negative for congestion, nosebleeds, sneezing, sore throat and trouble swallowing.   Eyes:  Positive for visual  disturbance. Negative for itching.  Respiratory:  Negative for cough.   Cardiovascular:  Negative for chest pain, palpitations and leg swelling.  Gastrointestinal:  Negative for abdominal distention, blood in stool, diarrhea and nausea.  Genitourinary:  Negative for frequency and hematuria.  Musculoskeletal:  Negative for back pain, gait problem, joint swelling and neck pain.  Skin:  Negative for rash.  Neurological:  Negative for dizziness, tremors, speech difficulty and weakness.  Psychiatric/Behavioral:  Negative for agitation, decreased concentration, dysphoric mood and sleep disturbance. The patient is not nervous/anxious.     Objective:  BP 120/72 (BP Location: Right Arm, Patient Position: Sitting, Cuff Size: Normal)   Pulse 67   Temp 97.9 F (36.6 C) (Oral)   Ht 6\' 2"  (1.88 m)   Wt 176 lb (79.8 kg)   SpO2 98%   BMI 22.60 kg/m   BP Readings from Last 3 Encounters:  05/28/22 120/72  05/14/22 122/76  04/17/22 100/62    Wt Readings from Last 3 Encounters:  05/28/22 176 lb (79.8 kg)  05/14/22 174 lb 3.2 oz (79 kg)  04/17/22 171 lb 9.6 oz (77.8 kg)    Physical Exam Constitutional:      General: He is not in acute distress.    Appearance: Normal appearance. He is well-developed.     Comments: NAD  Eyes:  Conjunctiva/sclera: Conjunctivae normal.     Pupils: Pupils are equal, round, and reactive to light.  Neck:     Thyroid: No thyromegaly.     Vascular: No JVD.  Cardiovascular:     Rate and Rhythm: Normal rate and regular rhythm.     Heart sounds: Normal heart sounds. No murmur heard.    No friction rub. No gallop.  Pulmonary:     Effort: Pulmonary effort is normal. No respiratory distress.     Breath sounds: Normal breath sounds. No wheezing or rales.  Chest:     Chest wall: No tenderness.  Abdominal:     General: Bowel sounds are normal. There is no distension.     Palpations: Abdomen is soft. There is no mass.     Tenderness: There is no abdominal  tenderness. There is no guarding or rebound.  Musculoskeletal:        General: No tenderness. Normal range of motion.     Cervical back: Normal range of motion.  Lymphadenopathy:     Cervical: No cervical adenopathy.  Skin:    General: Skin is warm and dry.     Findings: No rash.  Neurological:     Mental Status: He is alert and oriented to person, place, and time.     Cranial Nerves: No cranial nerve deficit.     Motor: No abnormal muscle tone.     Coordination: Coordination normal.     Gait: Gait normal.     Deep Tendon Reflexes: Reflexes are normal and symmetric.  Psychiatric:        Behavior: Behavior normal.        Thought Content: Thought content normal.        Judgment: Judgment normal.     Lab Results  Component Value Date   WBC 6.7 05/22/2022   HGB 12.8 (L) 05/22/2022   HCT 39.0 05/22/2022   PLT 252.0 05/22/2022   GLUCOSE 85 05/22/2022   CHOL 144 05/22/2022   TRIG 56.0 05/22/2022   HDL 65.00 05/22/2022   LDLCALC 68 05/22/2022   ALT 18 05/22/2022   AST 19 05/22/2022   NA 140 05/22/2022   K 4.6 05/22/2022   CL 104 05/22/2022   CREATININE 1.06 05/22/2022   BUN 23 05/22/2022   CO2 31 05/22/2022   TSH 4.58 05/22/2022   PSA 3.08 05/22/2022   INR 1.0 04/08/2020   HGBA1C 6.0 05/22/2022    No results found.  Assessment & Plan:   Problem List Items Addressed This Visit     Abnormal chest CT    F/u w/Pulmonary Med CT PET is pending in Feb 2024 Therapy has not been smoking      Abnormality of lung on CXR - Primary    F/u w/Pulmonary Med CT PET is pending in Feb 2024      COPD mixed type ([place])    Not using MDI      GERD (gastroesophageal reflux disease)    Use Protonix prn, prn Zofran         No orders of the defined types were placed in this encounter.     Follow-up: Return in about 6 months (around 11/27/2022) for a follow-up visit.  Walker Kehr, MD

## 2022-06-06 DIAGNOSIS — I471 Supraventricular tachycardia, unspecified: Secondary | ICD-10-CM | POA: Diagnosis not present

## 2022-06-06 DIAGNOSIS — I10 Essential (primary) hypertension: Secondary | ICD-10-CM | POA: Diagnosis not present

## 2022-06-06 DIAGNOSIS — J439 Emphysema, unspecified: Secondary | ICD-10-CM | POA: Diagnosis not present

## 2022-06-06 DIAGNOSIS — G4733 Obstructive sleep apnea (adult) (pediatric): Secondary | ICD-10-CM | POA: Diagnosis not present

## 2022-06-20 ENCOUNTER — Encounter: Payer: Self-pay | Admitting: Internal Medicine

## 2022-06-20 ENCOUNTER — Telehealth: Payer: Self-pay | Admitting: Internal Medicine

## 2022-06-20 NOTE — Telephone Encounter (Signed)
Called and got pt scheduled for   PFT  07/12/2022 at 4pm  New Pt Appt  Dr Lamonte Sakai 3pm 07/18/2022  Patient is aware of both appointments. Also told patient on the phone if he could bring in copy of CT scan disk at appt with Dr Lamonte Sakai that would be great. Nothing further needed

## 2022-06-20 NOTE — Assessment & Plan Note (Signed)
Mild on previous testing Plan- update PFT

## 2022-06-20 NOTE — Telephone Encounter (Signed)
Patient is returning a call.  Please call patient back to discuss.  CB# [phone]

## 2022-06-20 NOTE — Assessment & Plan Note (Signed)
Will assess with PET and refer to nodule expert. This could be inflammatory but with his smoking hx, concern is CA. Discussed with him and wife. Plan- PET, referral.

## 2022-06-28 ENCOUNTER — Telehealth: Payer: Self-pay

## 2022-06-28 ENCOUNTER — Ambulatory Visit (INDEPENDENT_AMBULATORY_CARE_PROVIDER_SITE_OTHER): Payer: Medicare Other

## 2022-06-28 VITALS — Ht 74.0 in | Wt 170.0 lb

## 2022-06-28 DIAGNOSIS — Z0000 Encounter for general adult medical examination without abnormal findings: Secondary | ICD-10-CM | POA: Diagnosis not present

## 2022-06-28 NOTE — Progress Notes (Addendum)
Virtual Visit via Telephone Note  I connected with  David Buck on 06/28/22 at  1:00 PM EST by telephone and verified that I am speaking with the correct person using two identifiers.  Location: Patient: Home Provider: [place] Persons participating in the virtual visit: [place]   I discussed the limitations, risks, security and privacy concerns of performing an evaluation and management service by telephone and the availability of in person appointments. The patient expressed understanding and agreed to proceed.  Interactive audio and video telecommunications were attempted between this nurse and patient, however failed, due to patient having technical difficulties OR patient did not have access to video capability.  We continued and completed visit with audio only.  Some vital signs may be absent or patient reported.   Sheral Flow, LPN  Subjective:   David Buck is a 82 y.o. male who presents for Medicare Annual/Subsequent preventive examination.  Review of Systems     Cardiac Risk Factors include: advanced age (>32men, >33 women);hypertension;male gender     Objective:    Today's Vitals   06/28/22 1306  Weight: 170 lb (77.1 kg)  Height: 6\' 2"  (1.88 m)  PainSc: 0-No pain   Body mass index is 21.83 kg/m.     06/28/2022    1:08 PM 06/27/2021    3:04 PM 04/20/2021    8:00 AM 04/12/2021    2:31 PM 05/16/2020   11:46 AM 04/14/2020   12:00 PM 03/23/2020    6:51 PM  Advanced Directives  Does Patient Have a Medical Advance Directive? Yes Yes Yes Yes Yes Yes No  Type of Paramedic of [place];Living will Living will;Healthcare Power of [place];Living will  Living will;Healthcare Power of [place];Living will   Does patient want to make changes to medical advance directive?  No - Patient declined No - Patient declined  No - Patient declined    Copy  of [place] in Chart? No - copy requested No - copy requested No - copy requested No - copy requested No - copy requested No - copy requested   Would patient like information on creating a medical advance directive?   No - Patient declined No - Patient declined       Current Medications (verified) Outpatient Encounter Medications as of 06/28/2022  Medication Sig   albuterol (VENTOLIN HFA) 108 (90 Base) MCG/ACT inhaler Inhale 2 puffs into the lungs every 4 (four) hours as needed for wheezing or shortness of breath.   Ascorbic Acid (VITAMIN C WITH ROSE HIPS) 500 MG tablet Take 1 by mouth daily   buPROPion (WELLBUTRIN XL) 150 MG 24 hr tablet Take 1 tablet (150 mg total) by mouth daily.   Cholecalciferol (VITAMIN D3) 50 MCG (2000 UT) TABS Take 2,000 Units by mouth every evening.   faricimab-svoa (VABYSMO) 6 MG/0.05ML SOLN intravitreal injection 6 mg by Intravitreal route once.   GLUCOSAMINE RELIEF 1000 MG TABS SMARTSIG:1 By Mouth   loratadine (CLARITIN) 10 MG tablet Take 1 tablet by mouth daily.   LORazepam (ATIVAN) 0.5 MG tablet TAKE ONE TABLET BY MOUTH TWICE A DAY AS NEEDED FOR ANXIETY   Multiple Vitamin (MULTIVITAMIN WITH MINERALS) TABS tablet Take 1 tablet by mouth every evening.   olmesartan (BENICAR) 20 MG tablet TAKE ONE TABLET BY MOUTH DAILY   ondansetron (ZOFRAN) 4 MG tablet Take 1 tablet (4 mg total) by mouth every 8 (eight) hours as needed for  nausea or vomiting.   pantoprazole (PROTONIX) 40 MG tablet Take 1 tablet (40 mg total) by mouth daily.   Polyethyl Glycol-Propyl Glycol (SYSTANE ULTRA) 0.4-0.3 % SOLN Place 1 drop into both eyes every evening.   ZINC PICOLINATE PO Take 22 mg by mouth every evening.   No facility-administered encounter medications on file as of 06/28/2022.    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   Meniere's disease    OSA (obstructive sleep apnea) 01/25/2022   Osteoarthritis of right knee    Dr Percell Miller   Pneumonia    hx of 2/17    Posterior vitreous detachment 2011   R, body- Dr Baird Cancer   Prostatitis    Tinnitus    Past Surgical History:  Procedure Laterality Date   bilateral cataract surgery      BRONCHOSCOPY  04-14-08   benign   INGUINAL HERNIA REPAIR  2005   Dr Hassell Done- bilateral    right knee menisc tear repair     STRABISMUS SURGERY Right 04/20/2021   Procedure: STRABISMUS REPAIR RIGHT EYE;  Surgeon: Lamonte Sakai, MD;  Location: [facility];  Service: Ophthalmology;  Laterality: Right;   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   Procedure: RIGHT TOTAL KNEE ARTHROPLASTY;  Surgeon: Gaynelle Arabian, MD;  Location: [facility];  Service: Orthopedics;  Laterality: Right;   VIDEO BRONCHOSCOPY N/A 04/14/2020   Procedure: VIDEO BRONCHOSCOPY WITHOUT FLUORO;  Surgeon: Margaretha Seeds, MD;  Location: [facility];  Service: Cardiopulmonary;  Laterality: N/A;   Family History  Problem Relation Age of Onset   Cancer Mother        ovarian and bowel   Other Father        cerebral hemorrage   Hypertension Other    Social History   Socioeconomic History   Marital status: Married    Spouse name: Not on file   Number of children: Not on file   Years of education: Not on file   Highest education level: Not on file  Occupational History   Not on file  Tobacco Use   Smoking status: Former    Packs/day: 1.00    Years: 50.00    Total pack years: 50.00    Types: Cigarettes    Start date: 75    Quit date: 01/17/2021    Years since quitting: 1.4   Smokeless tobacco: Never   Tobacco comments:    about 1 per day- wearing patch  Vaping Use   Vaping Use: Never used  Substance and Sexual Activity   Alcohol use: Yes    Comment: occasional     Drug use: No   Sexual activity: Not on file  Other Topics Concern   Not on file  Social History Narrative   Not on file   Social Determinants of Health   Financial Resource Strain: Low Risk  (06/28/2022)   Overall Financial Resource Strain (CARDIA)    Difficulty of Paying Living Expenses: Not hard at all  Food Insecurity: No Food Insecurity (06/28/2022)   Hunger Vital Sign    Worried About Running Out of Food in the Last Year: Never true    [place] in the  Last Year: Never true  Transportation Needs: No Transportation Needs (06/28/2022)   PRAPARE - Hydrologist (Medical): No    Lack of Transportation (Non-Medical): No  Physical Activity: Sufficiently Active (06/28/2022)   Exercise Vital Sign    Days of Exercise per Week: 5 days    Minutes of Exercise per Session: 30 min  Stress: No Stress Concern Present (06/28/2022)   [place]    Feeling of Stress : Not at all  Social Connections: Moderately Isolated (06/28/2022)   Social Connection and Isolation Panel [NHANES]    Frequency of Communication with Friends and Family: More than three times a week    Frequency of Social Gatherings with Friends and Family: Never    Attends Religious Services: Never    Marine scientist or Organizations: No    Attends Music therapist: Never    Marital Status: Married    Tobacco Counseling Counseling given: Not Answered Tobacco comments: about 1 per day- wearing patch   Clinical Intake:  Pre-visit preparation completed: Yes  Pain : No/denies pain Pain Score: 0-No pain     BMI - recorded: 21.83 Nutritional Status: BMI of 19-24  Normal Nutritional Risks: None Diabetes: No  How often do you need to have someone help you when you read instructions, pamphlets, or other written materials from your doctor or pharmacy?: 1 - Never What is the last grade level you completed in  school?: HSG; Bachelor's Degree  Diabetic? no  Interpreter Needed?: No  Information entered by :: [name], LPN.   Activities of Daily Living    06/28/2022    1:11 PM  In your present state of health, do you have any difficulty performing the following activities:  Hearing? 1  Vision? 0  Difficulty concentrating or making decisions? 0  Walking or climbing stairs? 0  Dressing or bathing? 0  Doing errands, shopping? 0  Preparing Food and eating ? N  Using the Toilet? N  In the past six months, have you accidently leaked urine? N  Do you have problems with loss of bowel control? N  Managing your Medications? N  Managing your Finances? N  Housekeeping or managing your Housekeeping? N    Patient Care Team: [name], [name], MD as PCP - General Deneise Lever, MD (Pulmonary Disease) Gatha Mayer, MD (Gastroenterology) Thurman Coyer, DO as Consulting Physician (Sports Medicine) Franchot Gallo, MD as Consulting Physician (Urology) Gaynelle Arabian, MD as Consulting Physician (Orthopedic Surgery) Jolyn Nap, MD as Referring Physician (Ophthalmology) Alphia Moh, MD as Referring Physician (Cardiology) Katheran James., MD (Endocrinology) Iran Ouch, MD as Referring Physician (Ophthalmology) Monna Fam, MD as Consulting Physician (Ophthalmology)  Indicate any recent Medical Services you may have received from other than [facility] providers in the past year (date may be approximate).     Assessment:   This is a routine wellness examination for David Buck.  Hearing/Vision screen Hearing Screening - Comments:: Patient wears hearing aids. Vision Screening - Comments:: Wears rx glasses - up to date with routine eye exams with [facility] (Dr. Frederik Pear)   Dietary issues and exercise activities discussed: Current Exercise Habits: Home exercise routine, Type of exercise: walking, Time (Minutes): 30, Frequency (Times/Week): 5,  Weekly Exercise (Minutes/Week): 150, Exercise limited by: cardiac condition(s);respiratory conditions(s);orthopedic condition(s)   Goals Addressed             This Visit's Progress  To maintain my current health status by being independent, keeping up with all my appointments and staying focused.        Depression Screen    06/28/2022    1:11 PM 05/28/2022    4:14 PM 01/29/2022    3:49 PM 10/12/2021    3:03 PM 07/11/2021    3:19 PM 06/27/2021    3:09 PM 06/20/2020    4:10 PM  PHQ 2/9 Scores  PHQ - 2 Score 0 0 1 0 0 0 0  PHQ- 9 Score   2 0 0  0    Fall Risk    06/28/2022    1:09 PM 05/28/2022    4:13 PM 01/29/2022    3:49 PM 10/12/2021    3:03 PM 07/11/2021    3:19 PM  [place] in the past year? 0 0 0 0 1  Number falls in past yr: 0 0 0 0 0  Injury with Fall? 0 0 0 0 0  Risk for fall due to : No Fall Risks No Fall Risks No Fall Risks No Fall Risks Impaired balance/gait;History of fall(s)  Follow up Falls prevention discussed Falls evaluation completed Falls evaluation completed Falls evaluation completed     [place]:  Any stairs in or around the home? No  If so, are there any without handrails? No  Home free of loose throw rugs in walkways, pet beds, electrical cords, etc? Yes  Adequate lighting in your home to reduce risk of falls? Yes   ASSISTIVE DEVICES UTILIZED TO PREVENT FALLS:  Life alert? No  Use of a cane, walker or w/c? No  Grab bars in the bathroom? No  Shower chair or bench in shower? No  Elevated toilet seat or a handicapped toilet? No   TIMED UP AND GO:  Was the test performed? No . Phone Visit  Cognitive Function:        06/28/2022    1:11 PM  6CIT Screen  What Year? 0 points  What month? 0 points  What time? 0 points  Count back from 20 0 points  Months in reverse 0 points  Repeat phrase 0 points  Total Score 0 points    Immunizations Immunization History  Administered Date(s) Administered   BCG  04/29/2018   Fluad Quad(high Dose 65+) 04/10/2019, 04/05/2020   H1N1 04/25/2008   Influenza Split 04/10/2011, 04/02/2012   Influenza Whole 05/07/2008, 04/18/2009, 04/04/2010   Influenza, High Dose Seasonal PF 03/25/2016, 04/24/2016, 04/05/2017, 04/29/2018, 04/12/2021   Influenza, Seasonal, Injecte, Preservative Fre 04/02/2012   Influenza,inj,Quad PF,6+ Mos 03/20/2013, 04/09/2014, 04/18/2015   Influenza-Unspecified 03/25/2008, 03/25/2009, 03/26/2011, 04/10/2011, 04/02/2012, 03/20/2013, 04/09/2014, 04/18/2015, 04/24/2016, 04/05/2017, 04/05/2020, 04/12/2021, 05/01/2022   Moderna Sars-Covid-2 Vaccination 06/30/2019, 07/06/2019, 07/07/2019, 08/05/2019, 05/31/2020   PPD Test 04/29/2018   Pneumococcal Conjugate-13 05/15/2013   Pneumococcal Polysaccharide-23 07/23/2007, 04/26/2017   Pneumococcal-Unspecified 07/26/2006, 07/23/2007, 05/15/2013, 04/26/2017   Td 05/09/2009   Tdap 12/02/2017    TDAP status: Up to date  Flu Vaccine status: Up to date  Pneumococcal vaccine status: Up to date  Covid-19 vaccine status: Completed vaccines  Qualifies for Shingles Vaccine? Yes   Zostavax completed No   Shingrix Completed?: No.    Education has been provided regarding the importance of this vaccine. Patient has been advised to call insurance company to determine out of pocket expense if they have not yet received this vaccine. Advised may also receive vaccine at local pharmacy or Health Dept. Verbalized acceptance  and understanding.  Screening Tests Health Maintenance  Topic Date Due   Zoster Vaccines- Shingrix (1 of 2) Never done   Lung Cancer Screening  03/24/2021   COVID-19 Vaccine (6 - 2023-24 season) 02/23/2022   Medicare Annual Wellness (AWV)  06/29/2023   DTaP/Tdap/Td (3 - Td or Tdap) 12/03/2027   Pneumonia Vaccine 65+ Years old  Completed   INFLUENZA VACCINE  Completed   HPV VACCINES  Aged Out    Health Maintenance  Health Maintenance Due  Topic Date Due   Zoster Vaccines- Shingrix  (1 of 2) Never done   Lung Cancer Screening  03/24/2021   COVID-19 Vaccine (6 - 2023-24 season) 02/23/2022    Colorectal cancer screening: No longer required.   Lung Cancer Screening: (Low Dose CT Chest recommended if Age 82-80 years, 30 pack-year currently smoking OR have quit w/in 15years.) does qualify.   Lung Cancer Screening Referral: already scheduled with pulmonology  Additional Screening:  Hepatitis C Screening: does not qualify; Completed no  Vision Screening: Recommended annual ophthalmology exams for early detection of glaucoma and other disorders of the eye. Is the patient up to date with their annual eye exam?  Yes  Who is the provider or what is the name of the office in which the patient attends annual eye exams? [facility] If pt is not established with a provider, would they like to be referred to a provider to establish care? No .   Dental Screening: Recommended annual dental exams for proper oral hygiene  Community Resource Referral / Chronic Care Management: CRR required this visit?  No   CCM required this visit?  No      Plan:     I have personally reviewed and noted the following in the patient's chart:   Medical and social history Use of alcohol, tobacco or illicit drugs  Current medications and supplements including opioid prescriptions. Patient is not currently taking opioid prescriptions. Functional ability and status Nutritional status Physical activity Advanced directives List of other physicians Hospitalizations, surgeries, and ER visits in previous 12 months Vitals Screenings to include cognitive, depression, and falls Referrals and appointments  In addition, I have reviewed and discussed with patient certain preventive protocols, quality metrics, and best practice recommendations. A written personalized care plan for preventive services as well as general preventive health recommendations were provided to  patient.     Sheral Flow, LPN   09/01/5318   Nurse Notes: N/A  Medical screening examination/treatment/procedure(s) were performed by non-physician practitioner and as supervising physician I was immediately available for consultation/collaboration.  I agree with above. Lew Dawes, MD

## 2022-06-28 NOTE — Telephone Encounter (Signed)
error 

## 2022-06-28 NOTE — Patient Instructions (Signed)
David Buck , Thank you for taking time to come for your Medicare Wellness Visit. I appreciate your ongoing commitment to your health goals. Please review the following plan we discussed and let me know if I can assist you in the future.   These are the goals we discussed:  Goals      To maintain my current health status by being independent, keeping up with all my appointments and staying focused.        This is a list of the screening recommended for you and due dates:  Health Maintenance  Topic Date Due   Zoster (Shingles) Vaccine (1 of 2) Never done   Screening for Lung Cancer  03/24/2021   COVID-19 Vaccine (6 - 2023-24 season) 02/23/2022   Medicare Annual Wellness Visit  06/29/2023   DTaP/Tdap/Td vaccine (3 - Td or Tdap) 12/03/2027   Pneumonia Vaccine  Completed   Flu Shot  Completed   HPV Vaccine  Aged Out    Advanced directives: Yes  Conditions/risks identified: Yes  Next appointment: Follow up in one year for your annual wellness visit.   Preventive Care 10 Years and Older, Male  Preventive care refers to lifestyle choices and visits with your health care provider that can promote health and wellness. What does preventive care include? A yearly physical exam. This is also called an annual well check. Dental exams once or twice a year. Routine eye exams. Ask your health care provider how often you should have your eyes checked. Personal lifestyle choices, including: Daily care of your teeth and gums. Regular physical activity. Eating a healthy diet. Avoiding tobacco and drug use. Limiting alcohol use. Practicing safe sex. Taking low doses of aspirin every day. Taking vitamin and mineral supplements as recommended by your health care provider. What happens during an annual well check? The services and screenings done by your health care provider during your annual well check will depend on your age, overall health, lifestyle risk factors, and family history of  disease. Counseling  Your health care provider may ask you questions about your: Alcohol use. Tobacco use. Drug use. Emotional well-being. Home and relationship well-being. Sexual activity. Eating habits. History of falls. Memory and ability to understand (cognition). Work and work Statistician. Screening  You may have the following tests or measurements: Height, weight, and BMI. Blood pressure. Lipid and cholesterol levels. These may be checked every 5 years, or more frequently if you are over 68 years old. Skin check. Lung cancer screening. You may have this screening every year starting at age 40 if you have a 30-pack-year history of smoking and currently smoke or have quit within the past 15 years. Fecal occult blood test (FOBT) of the stool. You may have this test every year starting at age 46. Flexible sigmoidoscopy or colonoscopy. You may have a sigmoidoscopy every 5 years or a colonoscopy every 10 years starting at age 69. Prostate cancer screening. Recommendations will vary depending on your family history and other risks. Hepatitis C blood test. Hepatitis B blood test. Sexually transmitted disease (STD) testing. Diabetes screening. This is done by checking your blood sugar (glucose) after you have not eaten for a while (fasting). You may have this done every 1-3 years. Abdominal aortic aneurysm (AAA) screening. You may need this if you are a current or former smoker. Osteoporosis. You may be screened starting at age 46 if you are at high risk. Talk with your health care provider about your test results, treatment options, and if  necessary, the need for more tests. Vaccines  Your health care provider may recommend certain vaccines, such as: Influenza vaccine. This is recommended every year. Tetanus, diphtheria, and acellular pertussis (Tdap, Td) vaccine. You may need a Td booster every 10 years. Zoster vaccine. You may need this after age 33. Pneumococcal 13-valent  conjugate (PCV13) vaccine. One dose is recommended after age 35. Pneumococcal polysaccharide (PPSV23) vaccine. One dose is recommended after age 25. Talk to your health care provider about which screenings and vaccines you need and how often you need them. This information is not intended to replace advice given to you by your health care provider. Make sure you discuss any questions you have with your health care provider. Document Released: 07/08/2015 Document Revised: 02/29/2016 Document Reviewed: 04/12/2015 Elsevier Interactive Patient Education  2017 [place] Prevention in the Home Falls can cause injuries. They can happen to people of all ages. There are many things you can do to make your home safe and to help prevent falls. What can I do on the outside of my home? Regularly fix the edges of walkways and driveways and fix any cracks. Remove anything that might make you trip as you walk through a door, such as a raised step or threshold. Trim any bushes or trees on the path to your home. Use bright outdoor lighting. Clear any walking paths of anything that might make someone trip, such as rocks or tools. Regularly check to see if handrails are loose or broken. Make sure that both sides of any steps have handrails. Any raised decks and porches should have guardrails on the edges. Have any leaves, snow, or ice cleared regularly. Use sand or salt on walking paths during winter. Clean up any spills in your garage right away. This includes oil or grease spills. What can I do in the bathroom? Use night lights. Install grab bars by the toilet and in the tub and shower. Do not use towel bars as grab bars. Use non-skid mats or decals in the tub or shower. If you need to sit down in the shower, use a plastic, non-slip stool. Keep the floor dry. Clean up any water that spills on the floor as soon as it happens. Remove soap buildup in the tub or shower regularly. Attach bath mats  securely with double-sided non-slip rug tape. Do not have throw rugs and other things on the floor that can make you trip. What can I do in the bedroom? Use night lights. Make sure that you have a light by your bed that is easy to reach. Do not use any sheets or blankets that are too big for your bed. They should not hang down onto the floor. Have a firm chair that has side arms. You can use this for support while you get dressed. Do not have throw rugs and other things on the floor that can make you trip. What can I do in the kitchen? Clean up any spills right away. Avoid walking on wet floors. Keep items that you use a lot in easy-to-reach places. If you need to reach something above you, use a strong step stool that has a grab bar. Keep electrical cords out of the way. Do not use floor polish or wax that makes floors slippery. If you must use wax, use non-skid floor wax. Do not have throw rugs and other things on the floor that can make you trip. What can I do with my stairs? Do not leave any items  on the stairs. Make sure that there are handrails on both sides of the stairs and use them. Fix handrails that are broken or loose. Make sure that handrails are as long as the stairways. Check any carpeting to make sure that it is firmly attached to the stairs. Fix any carpet that is loose or worn. Avoid having throw rugs at the top or bottom of the stairs. If you do have throw rugs, attach them to the floor with carpet tape. Make sure that you have a light switch at the top of the stairs and the bottom of the stairs. If you do not have them, ask someone to add them for you. What else can I do to help prevent falls? Wear shoes that: Do not have high heels. Have rubber bottoms. Are comfortable and fit you well. Are closed at the toe. Do not wear sandals. If you use a stepladder: Make sure that it is fully opened. Do not climb a closed stepladder. Make sure that both sides of the stepladder  are locked into place. Ask someone to hold it for you, if possible. Clearly mark and make sure that you can see: Any grab bars or handrails. First and last steps. Where the edge of each step is. Use tools that help you move around (mobility aids) if they are needed. These include: Canes. Walkers. Scooters. Crutches. Turn on the lights when you go into a dark area. Replace any light bulbs as soon as they burn out. Set up your furniture so you have a clear path. Avoid moving your furniture around. If any of your floors are uneven, fix them. If there are any pets around you, be aware of where they are. Review your medicines with your doctor. Some medicines can make you feel dizzy. This can increase your chance of falling. Ask your doctor what other things that you can do to help prevent falls. This information is not intended to replace advice given to you by your health care provider. Make sure you discuss any questions you have with your health care provider. Document Released: 04/07/2009 Document Revised: 11/17/2015 Document Reviewed: 07/16/2014 Elsevier Interactive Patient Education  2017 [employer].

## 2022-06-29 DIAGNOSIS — H353220 Exudative age-related macular degeneration, left eye, stage unspecified: Secondary | ICD-10-CM | POA: Diagnosis not present

## 2022-07-11 ENCOUNTER — Other Ambulatory Visit: Payer: Self-pay

## 2022-07-11 DIAGNOSIS — J449 Chronic obstructive pulmonary disease, unspecified: Secondary | ICD-10-CM

## 2022-07-12 ENCOUNTER — Ambulatory Visit (INDEPENDENT_AMBULATORY_CARE_PROVIDER_SITE_OTHER): Payer: Medicare Other | Admitting: Internal Medicine

## 2022-07-12 DIAGNOSIS — J449 Chronic obstructive pulmonary disease, unspecified: Secondary | ICD-10-CM | POA: Diagnosis not present

## 2022-07-12 LAB — PULMONARY FUNCTION TEST
DL/VA % pred: 70 %
DL/VA: 2.68 ml/min/mmHg/L
DLCO cor % pred: 62 %
DLCO cor: 17.2 ml/min/mmHg
DLCO unc % pred: 62 %
DLCO unc: 17.2 ml/min/mmHg
FEF 25-75 Post: 1.31 L/sec
FEF 25-75 Pre: 1.04 L/sec
FEF2575-%Change-Post: 26 %
FEF2575-%Pred-Post: 55 %
FEF2575-%Pred-Pre: 44 %
FEV1-%Change-Post: 7 %
FEV1-%Pred-Post: 72 %
FEV1-%Pred-Pre: 67 %
FEV1-Post: 2.45 L
FEV1-Pre: 2.29 L
FEV1FVC-%Change-Post: 0 %
FEV1FVC-%Pred-Pre: 82 %
FEV6-%Change-Post: 8 %
FEV6-%Pred-Post: 92 %
FEV6-%Pred-Pre: 85 %
FEV6-Post: 4.12 L
FEV6-Pre: 3.81 L
FEV6FVC-%Change-Post: 1 %
FEV6FVC-%Pred-Post: 105 %
FEV6FVC-%Pred-Pre: 103 %
FVC-%Change-Post: 6 %
FVC-%Pred-Post: 88 %
FVC-%Pred-Pre: 82 %
FVC-Post: 4.16 L
FVC-Pre: 3.9 L
Post FEV1/FVC ratio: 59 %
Post FEV6/FVC ratio: 99 %
Pre FEV1/FVC ratio: 59 %
Pre FEV6/FVC Ratio: 98 %
RV % pred: 129 %
RV: 3.73 L
TLC % pred: 98 %
TLC: 7.76 L

## 2022-07-12 NOTE — Patient Instructions (Signed)
Full PFT performed today.

## 2022-07-12 NOTE — Progress Notes (Signed)
Full PFT performed today.

## 2022-07-18 ENCOUNTER — Ambulatory Visit (INDEPENDENT_AMBULATORY_CARE_PROVIDER_SITE_OTHER): Payer: Medicare Other | Admitting: Emergency Medicine

## 2022-07-18 ENCOUNTER — Encounter: Payer: Self-pay | Admitting: Emergency Medicine

## 2022-07-18 VITALS — BP 128/76 | HR 69 | Temp 97.7°F | Ht 74.0 in | Wt 177.4 lb

## 2022-07-18 DIAGNOSIS — R918 Other nonspecific abnormal finding of lung field: Secondary | ICD-10-CM | POA: Diagnosis not present

## 2022-07-18 NOTE — Assessment & Plan Note (Addendum)
Multiple pulmonary nodules that have been worked up with bronchoscopy initially in 2009, more recently 2021.  AFB has been negative.  Most recently a new partially cavitary right upper lobe nodule identified on CT chest while the patient was being treated for clinical pneumonia.  Etiology unclear, consider infectious, consider malignancy.  Plan to reimage with PET scan as planned, will add super D CT.  If suspicion remains on repeat imaging then we will arrange for navigational bronchoscopy.  We reviewed your CT scan of the chest from October Agree with PET scan as planned. We will add on a high-resolution CT scan of your chest to be done at the same time as the PET scan. Follow-up with Dr. Lamonte Sakai next available after the imaging so we can review, decide next steps.  We may decide to arrange for navigational bronchoscopy at that time.

## 2022-07-18 NOTE — Progress Notes (Signed)
Subjective:    Patient ID: David Buck, male    DOB: [birth year], 82 y.o.   MRN: [id number]   HPI 82 year old former smoker (50 pack years) who is followed in our office for COPD, pulm nodular disease.  PMH also significant for allergic rhinitis, hypertension, hypothyroidism, OSA. He is referred today for abnormal CT chest. He was diagnosed with a PNA in 03/2022 and was treated with abx x 2. He has clinically improved. He had FOB in 2009 and then again in 03/2020 > negative cx's except for pseudomonas.   CT chest 04/20/2022 reviewed by me shows moderate emphysema with subpleural nodular parenchymal scarring in the posterior basal lower lobes bilaterally.  Milder but similar changes noted in the peripheral right upper lobe and right middle lobe.  There is a spiculated cavitary pulmonary nodule 13 mm in the right upper lobe  Pulmonary function testing performed on 07/12/22 reviewed by me, showed moderately severe obstruction without a bronchodilator response, normal lung volumes, decreased diffusion capacity.   Review of Systems As per HPI  Past Medical History:  Diagnosis Date   Allergy    rhinitis   COPD (chronic obstructive pulmonary disease) ([place])    History of hepatitis B    recovered   Hypertension    Hypothyroidism    LAFB (left anterior fascicular block) 2008   on EKG   Meniere's disease    OSA (obstructive sleep apnea) 01/25/2022   Osteoarthritis of right knee    Dr Percell Miller   Pneumonia    hx of 2/17    Posterior vitreous detachment 2011   R, body- Dr Baird Cancer   Prostatitis    Tinnitus      Family History  Problem Relation Age of Onset   Cancer Mother        ovarian and bowel   Other Father        cerebral hemorrage   Hypertension Other      Social History   Socioeconomic History   Marital status: Married    Spouse name: Not on file   Number of children: Not on file   Years of education: Not on file   Highest education level: Not on file  Occupational  History   Not on file  Tobacco Use   Smoking status: Former    Packs/day: 1.00    Years: 50.00    Total pack years: 50.00    Types: Cigarettes    Start date: 49    Quit date: 01/17/2021    Years since quitting: 1.4   Smokeless tobacco: Never   Tobacco comments:    about 1 per day- wearing patch  Vaping Use   Vaping Use: Never used  Substance and Sexual Activity   Alcohol use: Yes    Comment: occasional    Drug use: No   Sexual activity: Not on file  Other Topics Concern   Not on file  Social History Narrative   Not on file   Social Determinants of Health   Financial Resource Strain: Low Risk  (06/28/2022)   Overall Financial Resource Strain (CARDIA)    Difficulty of Paying Living Expenses: Not hard at all  Food Insecurity: No Food Insecurity (06/28/2022)   Hunger Vital Sign    Worried About Running Out of Food in the Last Year: Never true    [place] in the Last Year: Never true  Transportation Needs: No Transportation Needs (06/28/2022)   PRAPARE - Transportation    Lack of  Transportation (Medical): No    Lack of Transportation (Non-Medical): No  Physical Activity: Sufficiently Active (06/28/2022)   Exercise Vital Sign    Days of Exercise per Week: 5 days    Minutes of Exercise per Session: 30 min  Stress: No Stress Concern Present (06/28/2022)   [place]    Feeling of Stress : Not at all  Social Connections: Moderately Isolated (06/28/2022)   Social Connection and Isolation Panel [NHANES]    Frequency of Communication with Friends and Family: More than three times a week    Frequency of Social Gatherings with Friends and Family: Never    Attends Religious Services: Never    Marine scientist or Organizations: No    Attends Archivist Meetings: Never    Marital Status: Married  Human resources officer Violence: Not At Risk (06/28/2022)   Humiliation, Afraid, Rape, and Kick questionnaire     Fear of Current or Ex-Partner: No    Emotionally Abused: No    Physically Abused: No    Sexually Abused: No     Allergies  Allergen Reactions   Cefdinir Rash   Cephalosporins Rash     Outpatient Medications Prior to Visit  Medication Sig Dispense Refill   albuterol (VENTOLIN HFA) 108 (90 Base) MCG/ACT inhaler Inhale 2 puffs into the lungs every 4 (four) hours as needed for wheezing or shortness of breath. 8 g 3   Ascorbic Acid (VITAMIN C WITH ROSE HIPS) 500 MG tablet Take 1 by mouth daily     buPROPion (WELLBUTRIN XL) 150 MG 24 hr tablet Take 1 tablet (150 mg total) by mouth daily. 90 tablet 3   Cholecalciferol (VITAMIN D3) 50 MCG (2000 UT) TABS Take 2,000 Units by mouth every evening.     faricimab-svoa (VABYSMO) 6 MG/0.05ML SOLN intravitreal injection 6 mg by Intravitreal route once.     GLUCOSAMINE RELIEF 1000 MG TABS SMARTSIG:1 By Mouth     loratadine (CLARITIN) 10 MG tablet Take 1 tablet by mouth daily.     LORazepam (ATIVAN) 0.5 MG tablet TAKE ONE TABLET BY MOUTH TWICE A DAY AS NEEDED FOR ANXIETY 60 tablet 3   Multiple Vitamin (MULTIVITAMIN WITH MINERALS) TABS tablet Take 1 tablet by mouth every evening.     olmesartan (BENICAR) 20 MG tablet TAKE ONE TABLET BY MOUTH DAILY 90 tablet 2   ondansetron (ZOFRAN) 4 MG tablet Take 1 tablet (4 mg total) by mouth every 8 (eight) hours as needed for nausea or vomiting. 20 tablet 0   pantoprazole (PROTONIX) 40 MG tablet Take 1 tablet (40 mg total) by mouth daily. 30 tablet 5   Polyethyl Glycol-Propyl Glycol (SYSTANE ULTRA) 0.4-0.3 % SOLN Place 1 drop into both eyes every evening.     ZINC PICOLINATE PO Take 22 mg by mouth every evening.     No facility-administered medications prior to visit.         Objective:   Physical Exam Vitals:   07/18/22 1509  BP: 128/76  Pulse: 69  Temp: 97.7 F (36.5 C)  TempSrc: Oral  SpO2: 97%  Weight: 177 lb 6.4 oz (80.5 kg)  Height: 6\' 2"  (1.88 m)   Gen: Pleasant, well-nourished, in no  distress,  normal affect  ENT: No lesions,  mouth clear,  oropharynx clear, no postnasal drip  Neck: No JVD, no stridor  Lungs: No use of accessory muscles, no crackles or wheezing on normal respiration, no wheeze on forced expiration  Cardiovascular:  RRR, heart sounds normal, no murmur or gallops, no peripheral edema  Musculoskeletal: No deformities, no cyanosis or clubbing  Neuro: alert, awake, non focal  Skin: Warm, no lesions or rash       Assessment & Plan:  Pulmonary nodules Multiple pulmonary nodules that have been worked up with bronchoscopy initially in 2009, more recently 2021.  AFB has been negative.  Most recently a new partially cavitary right upper lobe nodule identified on CT chest while the patient was being treated for clinical pneumonia.  Etiology unclear, consider infectious, consider malignancy.  Plan to reimage with PET scan as planned, will add super D CT.  If suspicion remains on repeat imaging then we will arrange for navigational bronchoscopy.  We reviewed your CT scan of the chest from October Agree with PET scan as planned. We will add on a high-resolution CT scan of your chest to be done at the same time as the PET scan. Follow-up with Dr. Lamonte Sakai next available after the imaging so we can review, decide next steps.  We may decide to arrange for navigational bronchoscopy at that time.   Baltazar Apo, MD, PhD 07/18/2022, 5:14 PM [facility] [phone] or if no answer before 7:00PM call [phone] For any issues after 7:00PM please call eLink [phone]

## 2022-07-18 NOTE — Patient Instructions (Signed)
We reviewed your CT scan of the chest from October Agree with PET scan as planned. We will add on a high-resolution CT scan of your chest to be done at the same time as the PET scan. Follow-up with Dr. Lamonte Sakai next available after the imaging so we can review, decide next steps.  We may decide to arrange for navigational bronchoscopy at that time.

## 2022-07-23 DIAGNOSIS — H40013 Open angle with borderline findings, low risk, bilateral: Secondary | ICD-10-CM | POA: Diagnosis not present

## 2022-07-23 LAB — HM DIABETES EYE EXAM

## 2022-07-24 ENCOUNTER — Encounter: Payer: Self-pay | Admitting: Internal Medicine

## 2022-07-26 ENCOUNTER — Other Ambulatory Visit: Payer: Self-pay | Admitting: Internal Medicine

## 2022-08-06 DIAGNOSIS — D1801 Hemangioma of skin and subcutaneous tissue: Secondary | ICD-10-CM | POA: Diagnosis not present

## 2022-08-06 DIAGNOSIS — L738 Other specified follicular disorders: Secondary | ICD-10-CM | POA: Diagnosis not present

## 2022-08-06 DIAGNOSIS — L218 Other seborrheic dermatitis: Secondary | ICD-10-CM | POA: Diagnosis not present

## 2022-08-06 DIAGNOSIS — L821 Other seborrheic keratosis: Secondary | ICD-10-CM | POA: Diagnosis not present

## 2022-08-06 DIAGNOSIS — D2271 Melanocytic nevi of right lower limb, including hip: Secondary | ICD-10-CM | POA: Diagnosis not present

## 2022-08-07 DIAGNOSIS — H35373 Puckering of macula, bilateral: Secondary | ICD-10-CM | POA: Diagnosis not present

## 2022-08-07 DIAGNOSIS — H4421 Degenerative myopia, right eye: Secondary | ICD-10-CM | POA: Diagnosis not present

## 2022-08-07 DIAGNOSIS — H59812 Chorioretinal scars after surgery for detachment, left eye: Secondary | ICD-10-CM | POA: Diagnosis not present

## 2022-08-07 DIAGNOSIS — H353221 Exudative age-related macular degeneration, left eye, with active choroidal neovascularization: Secondary | ICD-10-CM | POA: Diagnosis not present

## 2022-08-08 ENCOUNTER — Encounter (HOSPITAL_COMMUNITY)
Admission: RE | Admit: 2022-08-08 | Discharge: 2022-08-08 | Disposition: A | Discharge status: Home / Self Care | Payer: Medicare Other | Source: Ambulatory Visit | Attending: Emergency Medicine | Admitting: Emergency Medicine

## 2022-08-08 ENCOUNTER — Encounter (HOSPITAL_COMMUNITY)
Admission: RE | Admit: 2022-08-08 | Discharge: 2022-08-08 | Disposition: A | Discharge status: Home / Self Care | Payer: Medicare Other | Source: Ambulatory Visit | Attending: Internal Medicine | Admitting: Internal Medicine

## 2022-08-08 DIAGNOSIS — R911 Solitary pulmonary nodule: Secondary | ICD-10-CM | POA: Diagnosis not present

## 2022-08-08 DIAGNOSIS — R918 Other nonspecific abnormal finding of lung field: Secondary | ICD-10-CM | POA: Diagnosis not present

## 2022-08-08 LAB — GLUCOSE, CAPILLARY: Glucose-Capillary: 105 mg/dL — ABNORMAL HIGH (ref 70–99)

## 2022-08-08 MED ORDER — FLUDEOXYGLUCOSE F - 18 (FDG) INJECTION
8.5000 | Freq: Once | INTRAVENOUS | Status: AC
  Administered 2022-08-08: 8.51 via INTRAVENOUS

## 2022-08-12 NOTE — Progress Notes (Unsigned)
Subjective:    Patient ID: David Buck, male    DOB: 10-03-1940, 82 y.o.   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 Bronchoscopy (Dr Loanne Drilling) 04/14/20- BAL-Neg Fungal, Neg AFB, Pos Pseudomonas broadly sens. Had doxycycline prior to procedure, then Levaquin 500 mg x 7 days after. Cytology neg.   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  Denies cough. Continues following with ophthalmology for diplopia. CXR 01/30/22- IMPRESSION: Similar patchy irregular opacities right upper lung. Given the persistent nature, as well as the findings on prior chest CT March 24, 2020, recommend further evaluation with additional chest CT to assess for stability.  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 CT chest at [facility] shows old scarring, but a new 13 mm spiculated cavitary nodule RUL Have discussed evaluation. Will order PET. Minimal dry cough. No nodes, blood, fever or chest pain.  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 Dominant hypermetabolic spiculated solid pulmonary nodule of the right upper lobe, nodule has slowly enlarged when compared with multiple prior exams dating back to September 21, 2015 and is suspicious for primary lung neoplasm. 2. Additional scattered small pulmonary nodules, largest in the right lower lobe demonstrate mild hypermetabolic activity. Some nodules are new, others are decreased in size or resolved when compared with most recent prior. Findings are most likely due to chronic atypical infection. 3. No evidence of metastatic disease in the chest, abdomen or pelvis. 4. Indeterminate hyperdense exophytic renal lesions. Follow-up CT or MRI with and without contrast recommended to further evaluate. 5. Aortic  Atherosclerosis (ICD10-I70.  ROS-see HPI  + = positive Constitutional:   No-   weight loss, night sweats, fevers, chills, fatigue, lassitude. HEENT:   No-  headaches, difficulty swallowing, tooth/dental problems, sore throat,       No-  sneezing, itching, ear ache, +nasal congestion, +post nasal drip,  CV:  No-   chest pain, orthopnea, PND, swelling in lower extremities, anasarca, dizziness, palpitations Resp: No-   shortness of breath with exertion or at rest.             productive cough,  No- non-productive cough,  coughing up of blood.              No-   change in color of mucus.  No- wheezing.   Skin: No-   rash or lesions. GI:  No-   heartburn, indigestion, abdominal pain, nausea, vomiting,  GU:  MS:  No-   joint pain or swelling.  Neuro-     nothing unusual Psych:  No- change in mood or affect. No depression or anxiety.  No memory loss.  Objective:   Physical Exam General- Alert, Oriented, Affect-appropriate, Distress- none acute.+ tall slender man Skin- rash-none, lesions- none, excoriation- none Lymphadenopathy- none Head- atraumatic            Eyes- + patch over R eye for diplopia            Ears- Hearing, canals normal            Nose- Clear, No- Septal dev, +mucus postnasal drip, polyps, erosion, perforation             Throat- Mallampati II , mucosa clear , drainage- none, tonsils- atrophic  dentures Neck- flexible , trachea midline, no stridor , thyroid nl, carotid no bruit Chest - symmetrical excursion , unlabored           Heart/CV- RRR , no murmur , no gallop  , no rub, nl s1 s2                           - JVD- none , edema- none, stasis changes- none, varices- none           Lung- Clear, unlabored, wheeze- none, cough+slight , dullness-none, rub- none           Chest wall-  Abd-  Br/ Gen/ Rectal- Not done, not indicated Extrem- cyanosis- none, clubbing, none, atrophy- none, strength- nl Neuro-+ mild head bobbing tremor    Assessment & Plan:

## 2022-08-14 ENCOUNTER — Ambulatory Visit (INDEPENDENT_AMBULATORY_CARE_PROVIDER_SITE_OTHER): Payer: Medicare Other | Admitting: Internal Medicine

## 2022-08-14 ENCOUNTER — Encounter: Payer: Self-pay | Admitting: Internal Medicine

## 2022-08-14 VITALS — BP 118/64 | HR 96 | Ht 74.0 in | Wt 178.6 lb

## 2022-08-14 DIAGNOSIS — H35052 Retinal neovascularization, unspecified, left eye: Secondary | ICD-10-CM

## 2022-08-14 DIAGNOSIS — R942 Abnormal results of pulmonary function studies: Secondary | ICD-10-CM | POA: Diagnosis not present

## 2022-08-14 DIAGNOSIS — N2889 Other specified disorders of kidney and ureter: Secondary | ICD-10-CM | POA: Diagnosis not present

## 2022-08-14 DIAGNOSIS — R918 Other nonspecific abnormal finding of lung field: Secondary | ICD-10-CM | POA: Diagnosis not present

## 2022-08-14 NOTE — Patient Instructions (Addendum)
Keep appointment on Friday with Dr Lamonte Sakai.  Order- CT abdomen with and without iv contrast    dx renal lesions on PET  Order- BMET dx abnormal CT

## 2022-08-14 NOTE — Assessment & Plan Note (Signed)
Ongoing ophthalmology management at [facility]

## 2022-08-14 NOTE — Assessment & Plan Note (Addendum)
Spiculated right upper lobe nodule for management by Dr. Lamonte Sakai with a follow-up appointment later this week. 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  Bronchoscopy might include BAL for cultures.  Excisional biopsy/therapeutic resection of lesion might include special stains and cultures for AFB.

## 2022-08-15 ENCOUNTER — Encounter: Payer: Self-pay | Admitting: Internal Medicine

## 2022-08-15 LAB — BASIC METABOLIC PANEL
BUN: 26 mg/dL — ABNORMAL HIGH (ref 6–23)
CO2: 27 mEq/L (ref 19–32)
Calcium: 10 mg/dL (ref 8.4–10.5)
Chloride: 104 mEq/L (ref 96–112)
Creatinine, Ser: 1.13 mg/dL (ref 0.40–1.50)
GFR: 61.37 mL/min (ref >60.00)
Glucose, Bld: 81 mg/dL (ref 70–99)
Potassium: 4.6 mEq/L (ref 3.5–5.1)
Sodium: 139 mEq/L (ref 135–145)

## 2022-08-17 ENCOUNTER — Ambulatory Visit (INDEPENDENT_AMBULATORY_CARE_PROVIDER_SITE_OTHER): Payer: Medicare Other | Admitting: Emergency Medicine

## 2022-08-17 ENCOUNTER — Encounter: Payer: Self-pay | Admitting: Emergency Medicine

## 2022-08-17 VITALS — BP 114/74 | HR 77 | Temp 97.6°F | Ht 74.0 in | Wt 179.0 lb

## 2022-08-17 DIAGNOSIS — R918 Other nonspecific abnormal finding of lung field: Secondary | ICD-10-CM

## 2022-08-17 NOTE — Telephone Encounter (Signed)
Pt has called to check on MyChart message sent about his PET SCAN he had. He is needing provider's advice on having a CT of his groin area as well since he is having one on Tuesday.

## 2022-08-17 NOTE — Patient Instructions (Signed)
We will refer you to see Dr. Roxan Hockey with thoracic surgery to discuss possible primary resection of an enlarging right upper lobe pulmonary nodule. If Dr. Roxan Hockey believes that it would make more sense to perform navigational bronchoscopy to get a definitive tissue diagnosis before surgery then we will set this up with Dr. Lamonte Sakai. Please follow Dr. Lamonte Sakai in about 1 month or next available so we can confirm the plans.

## 2022-08-17 NOTE — Progress Notes (Signed)
Subjective:    Patient ID: David Buck, male    DOB: 1941-03-09, 82 y.o.   MRN: [id number]   HPI 82 year old former smoker (50 pack years) who is followed in our office for COPD, pulm nodular disease.  PMH also significant for allergic rhinitis, hypertension, hypothyroidism, OSA. He is referred today for abnormal CT chest. He was diagnosed with a PNA in 03/2022 and was treated with abx x 2. He has clinically improved. He had FOB in 2009 and then again in 03/2020 > negative cx's except for pseudomonas.   CT chest 04/20/2022 reviewed by me shows moderate emphysema with subpleural nodular parenchymal scarring in the posterior basal lower lobes bilaterally.  Milder but similar changes noted in the peripheral right upper lobe and right middle lobe.  There is a spiculated cavitary pulmonary nodule 13 mm in the right upper lobe  Pulmonary function testing performed on 07/12/22 reviewed by me, showed moderately severe obstruction without a bronchodilator response, normal lung volumes, decreased diffusion capacity.   ROV 08/17/2022 --follow-up visit for 82 year old gentleman with history of COPD and pulmonary nodular disease followed by Dr. Annamaria Boots in our office.  I saw him 07/18/2022 to evaluate a right upper lobe spiculated 13 mm cavitary pulmonary nodule.  We arranged for PET scan/super D CT as below.  Super D CT 08/08/2022 reviewed by me shows a slight increase in size of his spiculated posterior right upper lobe pulmonary nodule now 15 mm.  Other areas of peripheral tree-in-bud nodularity and bronchiectatic change, less prominent than on his prior imaging.  PET scan 08/08/2022 shows that the dominant hypermetabolic spiculated solid pulmonary nodule has slightly enlarged compared with multiple exams going back to 2017.  Additional scattered small pulmonary nodules in the right lower lobe have some hypermetabolism.  Others have decreased in size (waxing and waning).   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   Meniere's disease    OSA (obstructive sleep apnea) 01/25/2022   Osteoarthritis of right knee    Dr Percell Miller   Pneumonia    hx of 2/17    Posterior vitreous detachment 2011   R, body- Dr Baird [facility]   Prostatitis    Tinnitus      Family History  Problem Relation Age of Onset   Cancer Mother        ovarian and bowel   Other Father        cerebral hemorrage   Hypertension Other      Social History   Socioeconomic History   Marital status: Married    Spouse name: Not on file   Number of children: Not on file   Years of education: Not on file   Highest education level: Not on file  Occupational History   Not on file  Tobacco Use   Smoking status: Former    Packs/day: 1.00    Years: 50.00    Total pack years: 50.00    Types: Cigarettes    Start date: 32    Quit date: 01/17/2021    Years since quitting: 1.5   Smokeless tobacco: Never   Tobacco comments:    about 1 per day- wearing patch  Vaping Use  Vaping Use: Never used  Substance and Sexual Activity   Alcohol use: Yes    Comment: occasional    Drug use: No   Sexual activity: Not on file  Other Topics Concern   Not on file  Social History Narrative   Not on file   Social Determinants of Health   Financial Resource Strain: Low Risk  (06/28/2022)   Overall Financial Resource Strain (CARDIA)    Difficulty of Paying Living Expenses: Not hard at all  Food Insecurity: No Food Insecurity (06/28/2022)   Hunger Vital Sign    Worried About Running Out of Food in the Last Year: Never true    Ran Out of Food in the Last Year: Never true  Transportation Needs: No Transportation Needs (06/28/2022)   PRAPARE - Hydrologist (Medical): No    Lack of  Transportation (Non-Medical): No  Physical Activity: Sufficiently Active (06/28/2022)   Exercise Vital Sign    Days of Exercise per Week: 5 days    Minutes of Exercise per Session: 30 min  Stress: No Stress Concern Present (06/28/2022)   [place]    Feeling of Stress : Not at all  Social Connections: Moderately Isolated (06/28/2022)   Social Connection and Isolation Panel [NHANES]    Frequency of Communication with Friends and Family: More than three times a week    Frequency of Social Gatherings with Friends and Family: Never    Attends Religious Services: Never    Marine scientist or Organizations: No    Attends Archivist Meetings: Never    Marital Status: Married  Human resources officer Violence: Not At Risk (06/28/2022)   Humiliation, Afraid, Rape, and Kick questionnaire    Fear of Current or Ex-Partner: No    Emotionally Abused: No    Physically Abused: No    Sexually Abused: No     Allergies  Allergen Reactions   Cefdinir Rash   Cephalosporins Rash     Outpatient Medications Prior to Visit  Medication Sig Dispense Refill   albuterol (VENTOLIN HFA) 108 (90 Base) MCG/ACT inhaler Inhale 2 puffs into the lungs every 4 (four) hours as needed for wheezing or shortness of breath. 8 g 3   Ascorbic Acid (VITAMIN C WITH ROSE HIPS) 500 MG tablet Take 1 by mouth daily     buPROPion (WELLBUTRIN XL) 150 MG 24 hr tablet Take 1 tablet (150 mg total) by mouth daily. 90 tablet 3   Cholecalciferol (VITAMIN D3) 50 MCG (2000 UT) TABS Take 2,000 Units by mouth every evening.     GLUCOSAMINE RELIEF 1000 MG TABS SMARTSIG:1 By Mouth     loratadine (CLARITIN) 10 MG tablet Take 1 tablet by mouth daily.     LORazepam (ATIVAN) 0.5 MG tablet TAKE ONE TABLET BY MOUTH TWICE A DAY AS NEEDED FOR ANXIETY 60 tablet 3   Multiple Vitamin (MULTIVITAMIN WITH MINERALS) TABS tablet Take 1 tablet by mouth every evening.     olmesartan  (BENICAR) 20 MG tablet TAKE ONE TABLET BY MOUTH DAILY 90 tablet 2   ondansetron (ZOFRAN) 4 MG tablet Take 1 tablet (4 mg total) by mouth every 8 (eight) hours as needed for nausea or vomiting. 20 tablet 0   pantoprazole (PROTONIX) 40 MG tablet Take 1 tablet (40 mg total) by mouth daily. 30 tablet 5   Polyethyl Glycol-Propyl Glycol (SYSTANE ULTRA) 0.4-0.3 % SOLN Place 1 drop into both eyes every evening.  ZINC PICOLINATE PO Take 22 mg by mouth every evening.     faricimab-svoa (VABYSMO) 6 MG/0.05ML SOLN intravitreal injection 6 mg by Intravitreal route once. (Patient not taking: Reported on 08/17/2022)     No facility-administered medications prior to visit.         Objective:   Physical Exam Vitals:   08/17/22 1453  BP: 114/74  Pulse: 77  Temp: 97.6 F (36.4 C)  TempSrc: Oral  SpO2: 96%  Weight: 179 lb (81.2 kg)  Height: '6\' 2"'$  (1.88 m)   Gen: Pleasant, well-nourished, in no distress,  normal affect  ENT: No lesions,  mouth clear,  oropharynx clear, no postnasal drip  Neck: No JVD, no stridor  Lungs: No use of accessory muscles, no crackles or wheezing on normal respiration, no wheeze on forced expiration  Cardiovascular: RRR, heart sounds normal, no murmur or gallops, no peripheral edema  Musculoskeletal: No deformities, no cyanosis or clubbing  Neuro: alert, awake, non focal  Skin: Warm, no lesions or rash       Assessment & Plan:  Pulmonary nodules History of waxing and waning micronodular disease that has been negative for AFB on bronchoscopy x 2, most recently 04/14/2020.  The right upper lobe irregular pulmonary nodule looks different inasmuch as it has continued to increase in size, is hypermetabolic, spiculated, tethered to the fissure.  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  I did discuss with him the options including navigational bronchoscopy to confirm that this is not [facility].  The other option would be referral for primary resection.  He would like to  talk to Dr. Roxan Hockey with thoracic surgery to discuss possible VATS resection.  If we feel that he needs a tissue diagnosis before proceeding then I will arrange robotic assisted navigational bronchoscopy.  We will refer you to see Dr. Roxan Hockey with thoracic surgery to discuss possible primary resection of an enlarging right upper lobe pulmonary nodule. If Dr. Roxan Hockey believes that it would make more sense to perform navigational bronchoscopy to get a definitive tissue diagnosis before surgery then we will set this up with Dr. Lamonte Sakai. Please follow Dr. Lamonte Sakai in about 1 month or next available so we can confirm the plans.  Time spent 40 minutes  Baltazar Apo, MD, PhD 08/17/2022, 4:21 PM [facility] [phone] or if no answer before 7:00PM call [phone] For any issues after 7:00PM please call eLink [phone]

## 2022-08-17 NOTE — Assessment & Plan Note (Signed)
History of waxing and waning micronodular disease that has been negative for AFB on bronchoscopy x 2, most recently 04/14/2020.  The right upper lobe irregular pulmonary nodule looks different inasmuch as it has continued to increase in size, is hypermetabolic, spiculated, tethered to the fissure.  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  I did discuss with him the options including navigational bronchoscopy to confirm that this is not [facility].  The other option would be referral for primary resection.  He would like to talk to Dr. Roxan Hockey with thoracic surgery to discuss possible VATS resection.  If we feel that he needs a tissue diagnosis before proceeding then I will arrange robotic assisted navigational bronchoscopy.  We will refer you to see Dr. Roxan Hockey with thoracic surgery to discuss possible primary resection of an enlarging right upper lobe pulmonary nodule. If Dr. Roxan Hockey believes that it would make more sense to perform navigational bronchoscopy to get a definitive tissue diagnosis before surgery then we will set this up with Dr. Lamonte Sakai. Please follow Dr. Lamonte Sakai in about 1 month or next available so we can confirm the plans.

## 2022-08-21 ENCOUNTER — Ambulatory Visit (HOSPITAL_BASED_OUTPATIENT_CLINIC_OR_DEPARTMENT_OTHER)
Admission: RE | Admit: 2022-08-21 | Discharge: 2022-08-21 | Disposition: A | Discharge status: Home / Self Care | Payer: Medicare Other | Source: Ambulatory Visit | Attending: Internal Medicine | Admitting: Internal Medicine

## 2022-08-21 ENCOUNTER — Encounter (HOSPITAL_BASED_OUTPATIENT_CLINIC_OR_DEPARTMENT_OTHER): Payer: Self-pay

## 2022-08-21 ENCOUNTER — Institutional Professional Consult (permissible substitution) (INDEPENDENT_AMBULATORY_CARE_PROVIDER_SITE_OTHER): Payer: Medicare Other | Admitting: Thoracic Surgery (Cardiothoracic Vascular Surgery)

## 2022-08-21 ENCOUNTER — Encounter: Payer: Self-pay | Admitting: Thoracic Surgery (Cardiothoracic Vascular Surgery)

## 2022-08-21 VITALS — BP 143/89 | HR 74 | Resp 18 | Ht 74.0 in | Wt 179.0 lb

## 2022-08-21 DIAGNOSIS — R942 Abnormal results of pulmonary function studies: Secondary | ICD-10-CM

## 2022-08-21 DIAGNOSIS — N2889 Other specified disorders of kidney and ureter: Secondary | ICD-10-CM

## 2022-08-21 DIAGNOSIS — R918 Other nonspecific abnormal finding of lung field: Secondary | ICD-10-CM

## 2022-08-21 DIAGNOSIS — N281 Cyst of kidney, acquired: Secondary | ICD-10-CM | POA: Diagnosis not present

## 2022-08-21 MED ORDER — IOHEXOL 300 MG/ML  SOLN
100.0000 mL | Freq: Once | INTRAMUSCULAR | Status: AC | PRN
  Administered 2022-08-21: 100 mL via INTRAVENOUS

## 2022-08-21 NOTE — Progress Notes (Signed)
PCP is [name], [name], MD Referring Provider is Collene Gobble, MD  David Buck is sent for consultation regarding a right upper lobe lung nodule.  HPI:  David Buck is an 82 year old man with a history of tobacco abuse, COPD, MAC, hypertension, hypothyroidism, left anterior fascicular block, Mnire's disease, pneumonia, vertigo, syncope, aortic and coronary atherosclerosis, thyroid orbitopathy, reflux, and obstructive sleep apnea.  He has been followed by Dr. Baird Lyons for COPD and a history of Mycobacterium avium complex infection.  He has had a history of waxing and waning lung nodules.  He has had 2 negative bronchoscopies in the past.  A CT this past fall showed a new 13 mm right upper lobe lung nodule.  On PET/CT the nodule was hypermetabolic.  He was referred to Dr. Lamonte Sakai.  He felt that given the high index of suspicion that surgical resection would be preferred to bronchoscopy.  He has a history of tobacco abuse smoking about a pack of cigarettes daily for 50 years prior to quitting 2 years ago.  He does have a history of COPD and is on an inhaler.  He does not ever use the inhaler.  He gets short of breath with heavy exertion but not with walking normally.  He can walk up a flight of stairs without having to stop.  No chest pain, pressure, or tightness.  No unusual headaches or visual changes.  He does have impaired vision due to thyroid disease.  Zubrod Score: At the time of surgery this patient's most appropriate activity status/level should be described as: '[]'$     0    Normal activity, no symptoms '[x]'$     1    Restricted in physical strenuous activity but ambulatory, able to do out light work '[]'$     2    Ambulatory and capable of self care, unable to do work activities, up and about >50 % of waking hours                              '[]'$     3    Only limited self care, in bed greater than 50% of waking hours '[]'$     4    Completely disabled, no self care, confined to  bed or chair '[]'$     5    Moribund  Past Medical History:  Diagnosis Date   Allergy    rhinitis   COPD (chronic obstructive pulmonary disease) ([place])    History of hepatitis B    recovered   Hypertension    Hypothyroidism    LAFB (left anterior fascicular block) 2008   on EKG   Meniere's disease    OSA (obstructive sleep apnea) 01/25/2022   Osteoarthritis of right knee    Dr Percell Miller   Pneumonia    hx of 2/17    Posterior vitreous detachment 2011   R, body- Dr Baird Cancer   Prostatitis    Tinnitus     Past Surgical History:  Procedure Laterality Date   bilateral cataract surgery      BRONCHOSCOPY  04-14-08   benign   INGUINAL HERNIA REPAIR  2005   Dr Hassell Done- bilateral    right knee menisc tear repair     STRABISMUS SURGERY Right 04/20/2021   Procedure: STRABISMUS REPAIR RIGHT EYE;  Surgeon: Lamonte Sakai, MD;  Location: [facility];  Service: Ophthalmology;  Laterality: Right;   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  Procedure: RIGHT TOTAL KNEE ARTHROPLASTY;  Surgeon: Gaynelle Arabian, MD;  Location: [facility];  Service: Orthopedics;  Laterality: Right;   VIDEO BRONCHOSCOPY N/A 04/14/2020   Procedure: VIDEO BRONCHOSCOPY WITHOUT FLUORO;  Surgeon: Margaretha Seeds, MD;  Location: [facility];  Service: Cardiopulmonary;  Laterality: N/A;    Family History  Problem Relation Age of Onset   Cancer Mother        ovarian and bowel   Other Father        cerebral hemorrage   Hypertension Other     Social History Social History   Tobacco Use   Smoking status: Former    Packs/day: 1.00    Years: 50.00    Total pack years: 50.00    Types: Cigarettes    Start date: 56    Quit date: 01/17/2021    Years since quitting: 1.5   Smokeless tobacco: Never   Tobacco comments:    about 1 per day- wearing patch  Vaping Use   Vaping Use: Never used  Substance Use Topics   Alcohol use: Yes    Comment: occasional    Drug use: No    Current Outpatient Medications   Medication Sig Dispense Refill   albuterol (VENTOLIN HFA) 108 (90 Base) MCG/ACT inhaler Inhale 2 puffs into the lungs every 4 (four) hours as needed for wheezing or shortness of breath. 8 g 3   Ascorbic Acid (VITAMIN C WITH ROSE HIPS) 500 MG tablet Take 1 by mouth daily     buPROPion (WELLBUTRIN XL) 150 MG 24 hr tablet Take 1 tablet (150 mg total) by mouth daily. 90 tablet 3   Cholecalciferol (VITAMIN D3) 50 MCG (2000 UT) TABS Take 2,000 Units by mouth every evening.     faricimab-svoa (VABYSMO) 6 MG/0.05ML SOLN intravitreal injection 6 mg by Intravitreal route once. (Patient not taking: Reported on 08/17/2022)     GLUCOSAMINE RELIEF 1000 MG TABS SMARTSIG:1 By Mouth     loratadine (CLARITIN) 10 MG tablet Take 1 tablet by mouth daily.     LORazepam (ATIVAN) 0.5 MG tablet TAKE ONE TABLET BY MOUTH TWICE A DAY AS NEEDED FOR ANXIETY 60 tablet 3   Multiple Vitamin (MULTIVITAMIN WITH MINERALS) TABS tablet Take 1 tablet by mouth every evening.     olmesartan (BENICAR) 20 MG tablet TAKE ONE TABLET BY MOUTH DAILY 90 tablet 2   ondansetron (ZOFRAN) 4 MG tablet Take 1 tablet (4 mg total) by mouth every 8 (eight) hours as needed for nausea or vomiting. 20 tablet 0   pantoprazole (PROTONIX) 40 MG tablet Take 1 tablet (40 mg total) by mouth daily. 30 tablet 5   Polyethyl Glycol-Propyl Glycol (SYSTANE ULTRA) 0.4-0.3 % SOLN Place 1 drop into both eyes every evening.     ZINC PICOLINATE PO Take 22 mg by mouth every evening.     No current facility-administered medications for this visit.    Allergies  Allergen Reactions   Cefdinir Rash   Cephalosporins Rash    Review of Systems  Constitutional:  Negative for activity change, appetite change, chills, fever and unexpected weight change.  HENT:  Positive for dental problem (Dentures) and hearing loss. Negative for trouble swallowing and voice change.   Eyes:  Positive for visual disturbance (Not acute).  Respiratory:  Negative for cough, shortness of breath  and wheezing.   Cardiovascular:  Negative for chest pain and leg swelling.  Gastrointestinal:  Negative for abdominal distention and abdominal pain.  Genitourinary:  Negative for difficulty urinating.  Musculoskeletal:  Positive for arthralgias.  Neurological:  Positive for syncope (Last episode 2 years ago).       Patient unsure.  Thinks that diagnosis may have arisen around the time of a syncopal episode.  All other systems reviewed and are negative.   There were no vitals taken for this visit. Physical Exam Vitals reviewed.  Constitutional:      General: He is not in acute distress.    Appearance: Normal appearance.  HENT:     Head: Normocephalic and atraumatic.  Eyes:     General: No scleral icterus.    Extraocular Movements: Extraocular movements intact.  Cardiovascular:     Rate and Rhythm: Normal rate and regular rhythm.     Heart sounds: Normal heart sounds. No murmur heard. Pulmonary:     Effort: Pulmonary effort is normal. No respiratory distress.     Breath sounds: Normal breath sounds. No wheezing.  Abdominal:     General: There is no distension.     Palpations: Abdomen is soft.  Lymphadenopathy:     Cervical: No cervical adenopathy.  Skin:    General: Skin is warm and dry.  Neurological:     General: No focal deficit present.     Mental Status: He is alert and oriented to person, place, and time.     Cranial Nerves: No cranial nerve deficit.     Motor: No weakness.     Diagnostic Tests: CT CHEST WITHOUT CONTRAST   TECHNIQUE: Multidetector CT imaging of the chest was performed using thin slice collimation for electromagnetic bronchoscopy planning purposes, without intravenous contrast.   RADIATION DOSE REDUCTION: This exam was performed according to the departmental dose-optimization program which includes automated exposure control, adjustment of the mA and/or kV according to patient size and/or use of iterative reconstruction technique.    COMPARISON:  Chest CT 04/20/2022.   FINDINGS: Cardiovascular: The heart size is normal. No substantial pericardial effusion. Coronary artery calcification is evident. Mild atherosclerotic calcification is noted in the wall of the thoracic aorta.   Mediastinum/Nodes: No mediastinal lymphadenopathy. No evidence for gross hilar lymphadenopathy although assessment is limited by the lack of intravenous contrast on the current study. The esophagus has normal imaging features. There is no axillary lymphadenopathy.   Lungs/Pleura: Cavitary spiculated nodule identified previously in the posterior right upper lobe is more confluent today with interval decrease in cavitation. Lesion measures 15 mm today compared to 13 mm previously. Lesion retracts the adjacent major fissure.   As before there are numerous areas of peripheral tree-in-bud nodularity in both lungs associated with diffuse cylindrical bronchiectasis bilaterally, features suggesting sequelae of chronic atypical infection. This results in numerous peripheral bilateral pulmonary nodules with 1 of the more dominant nodules in the right lower lobe measuring 6 mm on image 122/4, new in the interval.   Plaque-like and nodular consolidative opacity seen in the dependent lung bases previously has largely resolved in the interval.   No pleural effusion.   Upper Abdomen: Large cyst noted upper pole left kidney. 2nd exophytic lesion in the interpolar left kidney measures 2 cm with attenuation too high to be a simple cyst.   Musculoskeletal: No worrisome lytic or sclerotic osseous abnormality.   IMPRESSION: 1. Cavitary spiculated nodule identified previously in the posterior right upper lobe is more confluent today with interval decrease in cavitation. Lesion measures 15 mm today compared to 13 mm previously and retracts the adjacent major fissure. 2. Numerous areas of peripheral tree-in-bud nodularity in both lungs associated  with  diffuse cylindrical bronchiectasis bilaterally. Plaque-like and nodular consolidative opacity seen in the dependent lung bases previously has largely resolved in the interval. Waxing and waning imaging features suggest chronic atypical infection. 3. 2 cm exophytic lesion in the interpolar left kidney with attenuation too high to be a simple cyst. This may be a cyst complicated by proteinaceous debris or hemorrhage, but neoplasm not excluded. Follow-up CT or MRI with and without contrast recommended to further evaluate. 4.  Aortic Atherosclerosis (ICD10-I70.0).     Electronically Signed   By: Misty Stanley M.D.   On: 08/08/2022 16:30   NUCLEAR MEDICINE PET SKULL BASE TO THIGH   TECHNIQUE: 0.5 mCi F-18 FDG was injected intravenously. Full-ring PET imaging was performed from the skull base to thigh after the radiotracer. CT data was obtained and used for attenuation correction and anatomic localization.   Fasting blood glucose: 105 mg/dl   COMPARISON:  None Available.   FINDINGS: Mediastinal blood pool activity: SUV max 2.4   Liver activity: SUV max NA   NECK: No hypermetabolic lymph nodes in the neck.   Incidental CT findings: None.   CHEST: Irregular solid pulmonary nodule of the right upper lobe measuring 15 mm, SUV max of 4.6. Of the left kidneyNumerous additional smaller pulmonary nodules are seen no significant FDG uptake. Solid nodule of the right lower lobe measuring 6 mm on series 9, image 67, SUV max of 3.4. Two adjacent solid pulmonary nodules of the right upper right lower lobe measuring 3 mm and 4 mm on series 9 image 52, SUV max of 1.5. Additional small pulmonary nodules below resolution for PET-CT. No enlarged lymph nodes seen in the chest.   Incidental CT findings: Emphysema. Moderate atherosclerotic disease of the thoracic aorta. Mild coronary artery calcifications.   ABDOMEN/PELVIS: No abnormal hypermetabolic activity within the liver, pancreas,  adrenal glands, or spleen. No hypermetabolic lymph nodes in the abdomen or pelvis.   Incidental CT findings: Simple appearing cyst of the upper pole of the left kidney. Indeterminate hyperdense exophytic left renal lesion measuring 2.0 cm lesion of the posterior right kidney measuring 1.3 cm no suspicious FDG uptake. Prostatomegaly. Diverticulosis. Moderate atherosclerotic disease of the abdominal aorta   SKELETON: No focal hypermetabolic activity to suggest skeletal metastasis.   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   IMPRESSION: 1. Dominant hypermetabolic spiculated solid pulmonary nodule of the right upper lobe, nodule has slowly enlarged when compared with multiple prior exams dating back to September 21, 2015 and is suspicious for primary lung neoplasm. 2. Additional scattered small pulmonary nodules, largest in the right lower lobe demonstrate mild hypermetabolic activity. Some nodules are new, others are decreased in size or resolved when compared with most recent prior. Findings are most likely due to chronic atypical infection. 3. No evidence of metastatic disease in the chest, abdomen or pelvis. 4. Indeterminate hyperdense exophytic renal lesions. Follow-up CT or MRI with and without contrast recommended to further evaluate. 5. Aortic Atherosclerosis (ICD10-I70.0).     Electronically Signed   By: Yetta Glassman M.D.   On: 08/09/2022 09:43  I personally reviewed the CT and PET images.  15 mm spiculated nodule posterior right upper lobe with retraction of adjacent fissure.  Hypermetabolic on PET.  Multiple other small nodules.  No evidence of regional or distant metastatic disease.  Pulmonary function testing FVC 3.9 (82%) FEV1 2.29 (67%)  FEV1 2.45 (72%) postbronchodilator DLCO 17.20 (62%)  Impression: David Buck is an  82 year old man with a history of tobacco abuse, COPD, MAC, hypertension, hypothyroidism, left anterior fascicular block, Mnire's disease, pneumonia, vertigo, syncope, aortic and coronary atherosclerosis, thyroid orbitopathy, reflux, and obstructive sleep apnea.  Right upper lobe lung nodule-he has a 15 mm spiculated nodule in the posterior segment of the right upper lobe with a pleural tail.  It is hypermetabolic on PET.  There is no hilar or mediastinal adenopathy.  There is a hypermetabolic nodule in the right lower lobe that is much smaller.  It is highly suspicious for a new primary bronchogenic carcinoma, and has to be considered that unless it can be proven otherwise.  Of course MAC or other opportunistic infections are within the differential diagnosis.  We discussed potential options for treatment should this be a lung cancer.  That would include radiation and surgical resection.  We discussed the relative advantages and disadvantages of each.  He understands that surgical resection is the gold standard.  Given the high index of suspicion I recommended that we proceed with surgical resection for definitive diagnosis and treatment.  The plan would be to do a wedge resection for diagnosis.  If this is cancer I would proceed with a lobectomy given its size, activity on PET, and extension to the pleura.  I do not think he would be an appropriate candidate for a wedge resection.  He has adequate pulmonary function to tolerate a lobectomy without issue.  I informed David Buck and his family of the general nature of the procedure including the need for general anesthesia, the incisions to be used, use of the surgical robot, the intraoperative decision making, the use of a drainage tube postoperatively, the expected hospital stay, and the overall recovery.  I informed them of the indications, risks, benefits, and alternatives.  They understand the risks include, but not limited to death, MI, DVT, PE,  bleeding, possible need for conversion to open procedure, possible need for transfusion, infection, prolonged air leak, cardiac arrhythmias, as well as possibility of other unforeseeable complications.  He understands and accepts the risks of surgery and wishes to proceed.  Plan: Robotic assisted right VATS for right upper lobe wedge resection and possible right upper lobectomy on Thursday, 08/30/2022    Melrose Nakayama, MD [facility] [phone]

## 2022-08-21 NOTE — H&P (View-Only) (Signed)
PCP is [facility], David Lacks, MD Referring Provider is David Gobble, MD  [facility] is sent for consultation regarding a right upper lobe lung nodule.  HPI:  [facility] is an 82 year old man with a history of tobacco abuse, COPD, MAC, hypertension, hypothyroidism, left anterior fascicular block, Mnire's disease, pneumonia, vertigo, syncope, aortic and coronary atherosclerosis, thyroid orbitopathy, reflux, and obstructive sleep apnea.  He has been followed by Dr. Baird [facility] for COPD and a history of Mycobacterium avium complex infection.  He has had a history of waxing and waning lung nodules.  He has had 2 negative bronchoscopies in the past.  A CT this past fall showed a new 13 mm right upper lobe lung nodule.  On PET/CT the nodule was hypermetabolic.  He was referred to Dr. Lamonte [facility].  He felt that given the high index of suspicion that surgical resection would be preferred to bronchoscopy.  He has a history of tobacco abuse smoking about a pack of cigarettes daily for 50 years prior to quitting 2 years ago.  He does have a history of COPD and is on an inhaler.  He does not ever use the inhaler.  He gets short of breath with heavy exertion but not with walking normally.  He can walk up a flight of stairs without having to stop.  No chest pain, pressure, or tightness.  No unusual headaches or visual changes.  He does have impaired vision due to thyroid disease.  Zubrod Score: At the time of surgery this patient's most appropriate activity status/level should be described as: '[]'$     0    Normal activity, no symptoms '[x]'$     1    Restricted in physical strenuous activity but ambulatory, able to do out light work '[]'$     2    Ambulatory and capable of self care, unable to do work activities, up and about >50 % of waking hours                              '[]'$     3    Only limited self care, in bed greater than 50% of waking hours '[]'$     4    Completely disabled, no self care, confined to  bed or chair '[]'$     5    Moribund  Past Medical History:  Diagnosis Date   Allergy    rhinitis   COPD (chronic obstructive pulmonary disease) ([place])    History of hepatitis B    recovered   Hypertension    Hypothyroidism    LAFB (left anterior fascicular block) 2008   on EKG   Meniere's disease    OSA (obstructive sleep apnea) 01/25/2022   Osteoarthritis of right knee    Dr [facility]   Pneumonia    hx of 2/17    Posterior vitreous detachment 2011   R, body- Dr [facility]   Prostatitis    Tinnitus     Past Surgical History:  Procedure Laterality Date   bilateral cataract surgery      BRONCHOSCOPY  04-14-08   benign   INGUINAL HERNIA REPAIR  2005   Dr [facility] Done- bilateral    right knee menisc tear repair     STRABISMUS SURGERY Right 04/20/2021   Procedure: STRABISMUS REPAIR RIGHT EYE;  Surgeon: David Sakai, MD;  Location: [facility];  Service: Ophthalmology;  Laterality: Right;   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  Procedure: RIGHT TOTAL KNEE ARTHROPLASTY;  Surgeon: David Arabian, MD;  Location: [facility];  Service: Orthopedics;  Laterality: Right;   VIDEO BRONCHOSCOPY N/A 04/14/2020   Procedure: VIDEO BRONCHOSCOPY WITHOUT FLUORO;  Surgeon: David Seeds, MD;  Location: [facility];  Service: Cardiopulmonary;  Laterality: N/A;    Family History  Problem Relation Age of Onset   Cancer Mother        ovarian and bowel   Other Father        cerebral hemorrage   Hypertension Other     Social History Social History   Tobacco Use   Smoking status: Former    Packs/day: 1.00    Years: 50.00    Total pack years: 50.00    Types: Cigarettes    Start date: 20    Quit date: 01/17/2021    Years since quitting: 1.5   Smokeless tobacco: Never   Tobacco comments:    about 1 per day- wearing patch  Vaping Use   Vaping Use: Never used  Substance Use Topics   Alcohol use: Yes    Comment: occasional    Drug use: No    Current Outpatient Medications   Medication Sig Dispense Refill   albuterol (VENTOLIN HFA) 108 (90 Base) MCG/ACT inhaler Inhale 2 puffs into the lungs every 4 (four) hours as needed for wheezing or shortness of breath. 8 g 3   Ascorbic Acid (VITAMIN C WITH ROSE HIPS) 500 MG tablet Take 1 by mouth daily     buPROPion (WELLBUTRIN XL) 150 MG 24 hr tablet Take 1 tablet (150 mg total) by mouth daily. 90 tablet 3   Cholecalciferol (VITAMIN D3) 50 MCG (2000 UT) TABS Take 2,000 Units by mouth every evening.     faricimab-svoa (VABYSMO) 6 MG/0.05ML SOLN intravitreal injection 6 mg by Intravitreal route once. (Patient not taking: Reported on 08/17/2022)     GLUCOSAMINE RELIEF 1000 MG TABS SMARTSIG:1 By Mouth     loratadine (CLARITIN) 10 MG tablet Take 1 tablet by mouth daily.     LORazepam (ATIVAN) 0.5 MG tablet TAKE ONE TABLET BY MOUTH TWICE A DAY AS NEEDED FOR ANXIETY 60 tablet 3   Multiple Vitamin (MULTIVITAMIN WITH MINERALS) TABS tablet Take 1 tablet by mouth every evening.     olmesartan (BENICAR) 20 MG tablet TAKE ONE TABLET BY MOUTH DAILY 90 tablet 2   ondansetron (ZOFRAN) 4 MG tablet Take 1 tablet (4 mg total) by mouth every 8 (eight) hours as needed for nausea or vomiting. 20 tablet 0   pantoprazole (PROTONIX) 40 MG tablet Take 1 tablet (40 mg total) by mouth daily. 30 tablet 5   Polyethyl Glycol-Propyl Glycol (SYSTANE ULTRA) 0.4-0.3 % SOLN Place 1 drop into both eyes every evening.     ZINC PICOLINATE PO Take 22 mg by mouth every evening.     No current facility-administered medications for this visit.    Allergies  Allergen Reactions   Cefdinir Rash   Cephalosporins Rash    Review of Systems  Constitutional:  Negative for activity change, appetite change, chills, fever and unexpected weight change.  HENT:  Positive for dental problem (Dentures) and hearing loss. Negative for trouble swallowing and voice change.   Eyes:  Positive for visual disturbance (Not acute).  Respiratory:  Negative for cough, shortness of breath  and wheezing.   Cardiovascular:  Negative for chest pain and leg swelling.  Gastrointestinal:  Negative for abdominal distention and abdominal pain.  Genitourinary:  Negative for difficulty urinating.  Musculoskeletal:  Positive for arthralgias.  Neurological:  Positive for syncope (Last episode 2 years ago).       Patient unsure.  Thinks that diagnosis may have arisen around the time of a syncopal episode.  All other systems reviewed and are negative.   There were no vitals taken for this visit. Physical Exam Vitals reviewed.  Constitutional:      General: He is not in acute distress.    Appearance: Normal appearance.  HENT:     Head: Normocephalic and atraumatic.  Eyes:     General: No scleral icterus.    Extraocular Movements: Extraocular movements intact.  Cardiovascular:     Rate and Rhythm: Normal rate and regular rhythm.     Heart sounds: Normal heart sounds. No murmur heard. Pulmonary:     Effort: Pulmonary effort is normal. No respiratory distress.     Breath sounds: Normal breath sounds. No wheezing.  Abdominal:     General: There is no distension.     Palpations: Abdomen is soft.  Lymphadenopathy:     Cervical: No cervical adenopathy.  Skin:    General: Skin is warm and dry.  Neurological:     General: No focal deficit present.     Mental Status: He is alert and oriented to person, place, and time.     Cranial Nerves: No cranial nerve deficit.     Motor: No weakness.     Diagnostic Tests: CT CHEST WITHOUT CONTRAST   TECHNIQUE: Multidetector CT imaging of the chest was performed using thin slice collimation for electromagnetic bronchoscopy planning purposes, without intravenous contrast.   RADIATION DOSE REDUCTION: This exam was performed according to the departmental dose-optimization program which includes automated exposure control, adjustment of the mA and/or kV according to patient size and/or use of iterative reconstruction technique.    COMPARISON:  Chest CT 04/20/2022.   FINDINGS: Cardiovascular: The heart size is normal. No substantial pericardial effusion. Coronary artery calcification is evident. Mild atherosclerotic calcification is noted in the wall of the thoracic aorta.   Mediastinum/Nodes: No mediastinal lymphadenopathy. No evidence for gross hilar lymphadenopathy although assessment is limited by the lack of intravenous contrast on the current study. The esophagus has normal imaging features. There is no axillary lymphadenopathy.   Lungs/Pleura: Cavitary spiculated nodule identified previously in the posterior right upper lobe is more confluent today with interval decrease in cavitation. Lesion measures 15 mm today compared to 13 mm previously. Lesion retracts the adjacent major fissure.   As before there are numerous areas of peripheral tree-in-bud nodularity in both lungs associated with diffuse cylindrical bronchiectasis bilaterally, features suggesting sequelae of chronic atypical infection. This results in numerous peripheral bilateral pulmonary nodules with 1 of the more dominant nodules in the right lower lobe measuring 6 mm on image 122/4, new in the interval.   Plaque-like and nodular consolidative opacity seen in the dependent lung bases previously has largely resolved in the interval.   No pleural effusion.   Upper Abdomen: Large cyst noted upper pole left kidney. 2nd exophytic lesion in the interpolar left kidney measures 2 cm with attenuation too high to be a simple cyst.   Musculoskeletal: No worrisome lytic or sclerotic osseous abnormality.   IMPRESSION: 1. Cavitary spiculated nodule identified previously in the posterior right upper lobe is more confluent today with interval decrease in cavitation. Lesion measures 15 mm today compared to 13 mm previously and retracts the adjacent major fissure. 2. Numerous areas of peripheral tree-in-bud nodularity in both lungs associated  with  diffuse cylindrical bronchiectasis bilaterally. Plaque-like and nodular consolidative opacity seen in the dependent lung bases previously has largely resolved in the interval. Waxing and waning imaging features suggest chronic atypical infection. 3. 2 cm exophytic lesion in the interpolar left kidney with attenuation too high to be a simple cyst. This may be a cyst complicated by proteinaceous debris or hemorrhage, but neoplasm not excluded. Follow-up CT or MRI with and without contrast recommended to further evaluate. 4.  Aortic Atherosclerosis (ICD10-I70.0).     Electronically Signed   By: Misty Stanley M.D.   On: 08/08/2022 16:30   NUCLEAR MEDICINE PET SKULL BASE TO THIGH   TECHNIQUE: 0.5 mCi F-18 FDG was injected intravenously. Full-ring PET imaging was performed from the skull base to thigh after the radiotracer. CT data was obtained and used for attenuation correction and anatomic localization.   Fasting blood glucose: 105 mg/dl   COMPARISON:  None Available.   FINDINGS: Mediastinal blood pool activity: SUV max 2.4   Liver activity: SUV max NA   NECK: No hypermetabolic lymph nodes in the neck.   Incidental CT findings: None.   CHEST: Irregular solid pulmonary nodule of the right upper lobe measuring 15 mm, SUV max of 4.6. Of the left kidneyNumerous additional smaller pulmonary nodules are seen no significant FDG uptake. Solid nodule of the right lower lobe measuring 6 mm on series 9, image 67, SUV max of 3.4. Two adjacent solid pulmonary nodules of the right upper right lower lobe measuring 3 mm and 4 mm on series 9 image 52, SUV max of 1.5. Additional small pulmonary nodules below resolution for PET-CT. No enlarged lymph nodes seen in the chest.   Incidental CT findings: Emphysema. Moderate atherosclerotic disease of the thoracic aorta. Mild coronary artery calcifications.   ABDOMEN/PELVIS: No abnormal hypermetabolic activity within the liver, pancreas,  adrenal glands, or spleen. No hypermetabolic lymph nodes in the abdomen or pelvis.   Incidental CT findings: Simple appearing cyst of the upper pole of the left kidney. Indeterminate hyperdense exophytic left renal lesion measuring 2.0 cm lesion of the posterior right kidney measuring 1.3 cm no suspicious FDG uptake. Prostatomegaly. Diverticulosis. Moderate atherosclerotic disease of the abdominal aorta   SKELETON: No focal hypermetabolic activity to suggest skeletal metastasis.   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   IMPRESSION: 1. Dominant hypermetabolic spiculated solid pulmonary nodule of the right upper lobe, nodule has slowly enlarged when compared with multiple prior exams dating back to September 21, 2015 and is suspicious for primary lung neoplasm. 2. Additional scattered small pulmonary nodules, largest in the right lower lobe demonstrate mild hypermetabolic activity. Some nodules are new, others are decreased in size or resolved when compared with most recent prior. Findings are most likely due to chronic atypical infection. 3. No evidence of metastatic disease in the chest, abdomen or pelvis. 4. Indeterminate hyperdense exophytic renal lesions. Follow-up CT or MRI with and without contrast recommended to further evaluate. 5. Aortic Atherosclerosis (ICD10-I70.0).     Electronically Signed   By: Yetta Glassman M.D.   On: 08/09/2022 09:43  I personally reviewed the CT and PET images.  15 mm spiculated nodule posterior right upper lobe with retraction of adjacent fissure.  Hypermetabolic on PET.  Multiple other small nodules.  No evidence of regional or distant metastatic disease.  Pulmonary function testing FVC 3.9 (82%) FEV1 2.29 (67%)  FEV1 2.45 (72%) postbronchodilator DLCO 17.20 (62%)  Impression: [facility] is an  82 year old man with a history of tobacco abuse, COPD, MAC, hypertension, hypothyroidism, left anterior fascicular block, Mnire's disease, pneumonia, vertigo, syncope, aortic and coronary atherosclerosis, thyroid orbitopathy, reflux, and obstructive sleep apnea.  Right upper lobe lung nodule-he has a 15 mm spiculated nodule in the posterior segment of the right upper lobe with a pleural tail.  It is hypermetabolic on PET.  There is no hilar or mediastinal adenopathy.  There is a hypermetabolic nodule in the right lower lobe that is much smaller.  It is highly suspicious for a new primary bronchogenic carcinoma, and has to be considered that unless it can be proven otherwise.  Of course MAC or other opportunistic infections are within the differential diagnosis.  We discussed potential options for treatment should this be a lung cancer.  That would include radiation and surgical resection.  We discussed the relative advantages and disadvantages of each.  He understands that surgical resection is the gold standard.  Given the high index of suspicion I recommended that we proceed with surgical resection for definitive diagnosis and treatment.  The plan would be to do a wedge resection for diagnosis.  If this is cancer I would proceed with a lobectomy given its size, activity on PET, and extension to the pleura.  I do not think he would be an appropriate candidate for a wedge resection.  He has adequate pulmonary function to tolerate a lobectomy without issue.  I informed Mr. Logiudice and his family of the general nature of the procedure including the need for general anesthesia, the incisions to be used, use of the surgical robot, the intraoperative decision making, the use of a drainage tube postoperatively, the expected hospital stay, and the overall recovery.  I informed them of the indications, risks, benefits, and alternatives.  They understand the risks include, but not limited to death, MI, DVT, PE,  bleeding, possible need for conversion to open procedure, possible need for transfusion, infection, prolonged air leak, cardiac arrhythmias, as well as possibility of other unforeseeable complications.  He understands and accepts the risks of surgery and wishes to proceed.  Plan: Robotic assisted right VATS for right upper lobe wedge resection and possible right upper lobectomy on Thursday, 08/30/2022    Melrose Nakayama, MD [facility] [phone]

## 2022-08-22 ENCOUNTER — Encounter: Payer: Self-pay | Admitting: *Deleted

## 2022-08-22 ENCOUNTER — Other Ambulatory Visit: Payer: Self-pay | Admitting: *Deleted

## 2022-08-22 DIAGNOSIS — Z5181 Encounter for therapeutic drug level monitoring: Secondary | ICD-10-CM

## 2022-08-22 DIAGNOSIS — R918 Other nonspecific abnormal finding of lung field: Secondary | ICD-10-CM

## 2022-08-28 NOTE — Pre-Procedure Instructions (Signed)
Surgical Instructions    Your procedure is scheduled on August 30, 2022.  Report to [facility] Main Entrance "A" at 9:10 A.M., then check in with the Admitting office.  Call this number if you have problems the morning of surgery:  [phone]  If you have any questions prior to your surgery date call [phone]: Open Monday-Friday 8am-4pm If you experience any cold or flu symptoms such as cough, fever, chills, shortness of breath, etc. between now and your scheduled surgery, please notify us at the above number.     Remember:  Do not eat or drink after midnight the night before your surgery     Take these medicines the morning of surgery with A SIP OF WATER:  buPROPion (WELLBUTRIN XL)   LORazepam (ATIVAN)     May take these medicines IF NEEDED:  loratadine (CLARITIN)   ondansetron (ZOFRAN)   pantoprazole (PROTONIX)    As of today, STOP taking any Aspirin (unless otherwise instructed by your surgeon) Aleve, Naproxen, Ibuprofen, Motrin, Advil, Goody's, BC's, all herbal medications, fish oil, and all vitamins.                     Do NOT Smoke (Tobacco/Vaping) for 24 hours prior to your procedure.  If you use a CPAP at night, you may bring your mask/headgear for your overnight stay.   Contacts, glasses, piercing's, hearing aid's, dentures or partials may not be worn into surgery, please bring cases for these belongings.    For patients admitted to the hospital, discharge time will be determined by your treatment team.   Patients discharged the day of surgery will not be allowed to drive home, and someone needs to stay with them for 24 hours.  SURGICAL WAITING ROOM VISITATION Patients having surgery or a procedure may have no more than 2 support people in the waiting area - these visitors may rotate.   Children under the age of 53 must have an adult with them who is not the patient. If the patient needs to stay at the hospital during part of their recovery, the visitor  guidelines for inpatient rooms apply. Pre-op nurse will coordinate an appropriate time for 1 support person to accompany patient in pre-op.  This support person may not rotate.   Please refer to the [facility] website for the visitor guidelines for Inpatients (after your surgery is over and you are in a regular room).    Special instructions:   [facility]- Preparing For Surgery  Before surgery, you can play an important role. Because skin is not sterile, your skin needs to be as free of germs as possible. You can reduce the number of germs on your skin by washing with CHG (chlorahexidine gluconate) Soap before surgery.  CHG is an antiseptic cleaner which kills germs and bonds with the skin to continue killing germs even after washing.    Oral Hygiene is also important to reduce your risk of infection.  Remember - BRUSH YOUR TEETH THE MORNING OF SURGERY WITH YOUR REGULAR TOOTHPASTE  Please do not use if you have an allergy to CHG or antibacterial soaps. If your skin becomes reddened/irritated stop using the CHG.  Do not shave (including legs and underarms) for at least 48 hours prior to first CHG shower. It is OK to shave your face.  Please follow these instructions carefully.   Shower the NIGHT BEFORE SURGERY and the MORNING OF SURGERY  If you chose to wash your hair, wash your hair first  as usual with your normal shampoo.  After you shampoo, rinse your hair and body thoroughly to remove the shampoo.  Use CHG Soap as you would any other liquid soap. You can apply CHG directly to the skin and wash gently with a scrungie or a clean washcloth.   Apply the CHG Soap to your body ONLY FROM THE NECK DOWN.  Do not use on open wounds or open sores. Avoid contact with your eyes, ears, mouth and genitals (private parts). Wash Face and genitals (private parts)  with your normal soap.   Wash thoroughly, paying special attention to the area where your surgery will be performed.  Thoroughly rinse  your body with warm water from the neck down.  DO NOT shower/wash with your normal soap after using and rinsing off the CHG Soap.  Pat yourself dry with a CLEAN TOWEL.  Wear CLEAN PAJAMAS to bed the night before surgery  Place CLEAN SHEETS on your bed the night before your surgery  DO NOT SLEEP WITH PETS.   Day of Surgery: Take a shower with CHG soap. Do not wear jewelry or makeup Do not wear lotions, powders, perfumes/colognes, or deodorant. Do not shave 48 hours prior to surgery.  Men may shave face and neck. Do not bring valuables to the hospital.  [facility] is not responsible for any belongings or valuables. Do not wear nail polish, gel polish, artificial nails, or any other type of covering on natural nails (fingers and toes) If you have artificial nails or gel coating that need to be removed by a nail salon, please have this removed prior to surgery. Artificial nails or gel coating may interfere with anesthesia's ability to adequately monitor your vital signs.  Wear Clean/Comfortable clothing the morning of surgery Remember to brush your teeth WITH YOUR REGULAR TOOTHPASTE.   Please read over the following fact sheets that you were given.    If you received a COVID test during your pre-op visit  it is requested that you wear a mask when out in public, stay away from anyone that may not be feeling well and notify your surgeon if you develop symptoms. If you have been in contact with anyone that has tested positive in the last 10 days please notify you surgeon.

## 2022-08-29 ENCOUNTER — Encounter (HOSPITAL_COMMUNITY)
Admission: RE | Admit: 2022-08-29 | Discharge: 2022-08-29 | Disposition: A | Discharge status: Home / Self Care | Payer: Medicare Other | Source: Ambulatory Visit | Attending: Thoracic Surgery (Cardiothoracic Vascular Surgery) | Admitting: Thoracic Surgery (Cardiothoracic Vascular Surgery)

## 2022-08-29 ENCOUNTER — Other Ambulatory Visit: Payer: Self-pay

## 2022-08-29 ENCOUNTER — Encounter (HOSPITAL_COMMUNITY): Payer: Self-pay

## 2022-08-29 ENCOUNTER — Ambulatory Visit (HOSPITAL_COMMUNITY)
Admission: RE | Admit: 2022-08-29 | Discharge: 2022-08-29 | Disposition: A | Discharge status: Home / Self Care | Payer: Medicare Other | Source: Ambulatory Visit | Attending: Thoracic Surgery (Cardiothoracic Vascular Surgery) | Admitting: Thoracic Surgery (Cardiothoracic Vascular Surgery)

## 2022-08-29 VITALS — BP 149/84 | HR 71 | Temp 97.4°F | Resp 18 | Ht 74.0 in | Wt 180.1 lb

## 2022-08-29 DIAGNOSIS — R112 Nausea with vomiting, unspecified: Secondary | ICD-10-CM | POA: Diagnosis not present

## 2022-08-29 DIAGNOSIS — Z01818 Encounter for other preprocedural examination: Secondary | ICD-10-CM | POA: Diagnosis not present

## 2022-08-29 DIAGNOSIS — J439 Emphysema, unspecified: Secondary | ICD-10-CM | POA: Diagnosis not present

## 2022-08-29 DIAGNOSIS — E039 Hypothyroidism, unspecified: Secondary | ICD-10-CM | POA: Diagnosis not present

## 2022-08-29 DIAGNOSIS — Z79899 Other long term (current) drug therapy: Secondary | ICD-10-CM | POA: Diagnosis not present

## 2022-08-29 DIAGNOSIS — J449 Chronic obstructive pulmonary disease, unspecified: Secondary | ICD-10-CM | POA: Diagnosis not present

## 2022-08-29 DIAGNOSIS — I959 Hypotension, unspecified: Secondary | ICD-10-CM | POA: Diagnosis not present

## 2022-08-29 DIAGNOSIS — R918 Other nonspecific abnormal finding of lung field: Secondary | ICD-10-CM | POA: Insufficient documentation

## 2022-08-29 DIAGNOSIS — J90 Pleural effusion, not elsewhere classified: Secondary | ICD-10-CM | POA: Diagnosis not present

## 2022-08-29 DIAGNOSIS — Z1152 Encounter for screening for COVID-19: Secondary | ICD-10-CM | POA: Insufficient documentation

## 2022-08-29 DIAGNOSIS — Z87891 Personal history of nicotine dependence: Secondary | ICD-10-CM | POA: Diagnosis not present

## 2022-08-29 DIAGNOSIS — G4733 Obstructive sleep apnea (adult) (pediatric): Secondary | ICD-10-CM

## 2022-08-29 DIAGNOSIS — R0602 Shortness of breath: Secondary | ICD-10-CM | POA: Diagnosis not present

## 2022-08-29 DIAGNOSIS — I2510 Atherosclerotic heart disease of native coronary artery without angina pectoris: Secondary | ICD-10-CM | POA: Diagnosis present

## 2022-08-29 DIAGNOSIS — Z800 Family history of malignant neoplasm of digestive organs: Secondary | ICD-10-CM | POA: Diagnosis not present

## 2022-08-29 DIAGNOSIS — R911 Solitary pulmonary nodule: Secondary | ICD-10-CM | POA: Diagnosis not present

## 2022-08-29 DIAGNOSIS — J9811 Atelectasis: Secondary | ICD-10-CM | POA: Diagnosis not present

## 2022-08-29 DIAGNOSIS — J939 Pneumothorax, unspecified: Secondary | ICD-10-CM | POA: Diagnosis not present

## 2022-08-29 DIAGNOSIS — Z5181 Encounter for therapeutic drug level monitoring: Secondary | ICD-10-CM

## 2022-08-29 DIAGNOSIS — J984 Other disorders of lung: Secondary | ICD-10-CM | POA: Diagnosis not present

## 2022-08-29 DIAGNOSIS — Z96651 Presence of right artificial knee joint: Secondary | ICD-10-CM | POA: Diagnosis present

## 2022-08-29 DIAGNOSIS — K219 Gastro-esophageal reflux disease without esophagitis: Secondary | ICD-10-CM | POA: Diagnosis present

## 2022-08-29 DIAGNOSIS — N179 Acute kidney failure, unspecified: Secondary | ICD-10-CM | POA: Diagnosis not present

## 2022-08-29 DIAGNOSIS — I444 Left anterior fascicular block: Secondary | ICD-10-CM | POA: Diagnosis present

## 2022-08-29 DIAGNOSIS — J9382 Other air leak: Secondary | ICD-10-CM | POA: Diagnosis not present

## 2022-08-29 DIAGNOSIS — D62 Acute posthemorrhagic anemia: Secondary | ICD-10-CM | POA: Diagnosis not present

## 2022-08-29 DIAGNOSIS — Z881 Allergy status to other antibiotic agents status: Secondary | ICD-10-CM | POA: Diagnosis not present

## 2022-08-29 DIAGNOSIS — J40 Bronchitis, not specified as acute or chronic: Secondary | ICD-10-CM | POA: Diagnosis not present

## 2022-08-29 DIAGNOSIS — Z8249 Family history of ischemic heart disease and other diseases of the circulatory system: Secondary | ICD-10-CM | POA: Diagnosis not present

## 2022-08-29 DIAGNOSIS — Z8041 Family history of malignant neoplasm of ovary: Secondary | ICD-10-CM | POA: Diagnosis not present

## 2022-08-29 DIAGNOSIS — J982 Interstitial emphysema: Secondary | ICD-10-CM | POA: Diagnosis not present

## 2022-08-29 DIAGNOSIS — I10 Essential (primary) hypertension: Secondary | ICD-10-CM | POA: Diagnosis not present

## 2022-08-29 DIAGNOSIS — J189 Pneumonia, unspecified organism: Secondary | ICD-10-CM | POA: Diagnosis not present

## 2022-08-29 HISTORY — DX: Anxiety disorder, unspecified: F41.9

## 2022-08-29 HISTORY — DX: Inflammatory liver disease, unspecified: K75.9

## 2022-08-29 HISTORY — DX: Essential tremor: G25.0

## 2022-08-29 LAB — COMPREHENSIVE METABOLIC PANEL
ALT: 37 U/L (ref 0–44)
AST: 35 U/L (ref 15–41)
Albumin: 4 g/dL (ref 3.5–5.0)
Alkaline Phosphatase: 70 U/L (ref 38–126)
Anion gap: 9 (ref 5–15)
BUN: 23 mg/dL (ref 8–23)
CO2: 23 mmol/L (ref 22–32)
Calcium: 9.7 mg/dL (ref 8.9–10.3)
Chloride: 106 mmol/L (ref 98–111)
Creatinine, Ser: 1.02 mg/dL (ref 0.61–1.24)
GFR, Estimated: >60 mL/min (ref >60)
Glucose, Bld: 97 mg/dL (ref 70–99)
Potassium: 4.2 mmol/L (ref 3.5–5.1)
Sodium: 138 mmol/L (ref 135–145)
Total Bilirubin: 0.7 mg/dL (ref 0.3–1.2)
Total Protein: 6.9 g/dL (ref 6.5–8.1)

## 2022-08-29 LAB — PROTIME-INR
INR: 1 (ref 0.8–1.2)
Prothrombin Time: 12.9 seconds (ref 11.4–15.2)

## 2022-08-29 LAB — URINALYSIS, ROUTINE W REFLEX MICROSCOPIC
Bilirubin Urine: NEGATIVE
Glucose, UA: NEGATIVE mg/dL
Hgb urine dipstick: NEGATIVE
Ketones, ur: NEGATIVE mg/dL
Leukocytes,Ua: NEGATIVE
Nitrite: NEGATIVE
Protein, ur: NEGATIVE mg/dL
Specific Gravity, Urine: 1.011 (ref 1.005–1.030)
pH: 6 (ref 5.0–8.0)

## 2022-08-29 LAB — BLOOD GAS, ARTERIAL
Acid-Base Excess: 2.8 mmol/L — ABNORMAL HIGH (ref 0.0–2.0)
Bicarbonate: 27.2 mmol/L (ref 20.0–28.0)
O2 Saturation: 98.1 %
Patient temperature: 37
pCO2 arterial: 40 mmHg (ref 32–48)
pH, Arterial: 7.44 (ref 7.35–7.45)
pO2, Arterial: 86 mmHg (ref 83–108)

## 2022-08-29 LAB — CBC
HCT: 40.2 % (ref 39.0–52.0)
Hemoglobin: 12.8 g/dL — ABNORMAL LOW (ref 13.0–17.0)
MCH: 30.7 pg (ref 26.0–34.0)
MCHC: 31.8 g/dL (ref 30.0–36.0)
MCV: 96.4 fL (ref 80.0–100.0)
Platelets: 278 10*3/uL (ref 150–400)
RBC: 4.17 MIL/uL — ABNORMAL LOW (ref 4.22–5.81)
RDW: 12.3 % (ref 11.5–15.5)
WBC: 6.5 10*3/uL (ref 4.0–10.5)
nRBC: 0 % (ref 0.0–0.2)

## 2022-08-29 LAB — TYPE AND SCREEN
ABO/RH(D): A POS
Antibody Screen: NEGATIVE

## 2022-08-29 LAB — SARS CORONAVIRUS 2 BY RT PCR: SARS Coronavirus 2 by RT PCR: NEGATIVE

## 2022-08-29 LAB — APTT: aPTT: 31 seconds (ref 24–36)

## 2022-08-29 LAB — SURGICAL PCR SCREEN
MRSA, PCR: NEGATIVE
Staphylococcus aureus: NEGATIVE

## 2022-08-29 NOTE — Pre-Procedure Instructions (Signed)
PCP - Dr. [name]. [place] Cardiologist - Dr. Alphia Moh  PPM/ICD - Denies Device Orders - n/a Rep Notified - n/a  Chest x-ray - 08/29/2022 EKG - 08/29/2022 Stress Test - 12/23/2015 ECHO - 12/03/2017 Cardiac Cath - Denies  Sleep Study - +OSA 01/25/2022. He is waiting to hear from office about appointment for CPAP  No DM  Last dose of GLP1 agonist- n/a GLP1 instructions: n/a  Blood Thinner Instructions: n/a Aspirin Instructions: n/a  NPO after midnight   COVID TEST- Yes. Result pending.   Anesthesia review: No.  Patient denies shortness of breath, fever, cough and chest pain at PAT appointment. Pt denies any respiratory illness/infection in the last two months.   All instructions explained to the patient, with a verbal understanding of the material. Patient agrees to go over the instructions while at home for a better understanding. Patient also instructed to self quarantine after being tested for COVID-19. The opportunity to ask questions was provided.

## 2022-08-30 ENCOUNTER — Inpatient Hospital Stay (HOSPITAL_COMMUNITY): Payer: Medicare Other

## 2022-08-30 ENCOUNTER — Other Ambulatory Visit: Payer: Self-pay

## 2022-08-30 ENCOUNTER — Encounter (HOSPITAL_COMMUNITY)
Admission: RE | Disposition: A | Discharge status: Home Health | Payer: Self-pay | Source: Home / Self Care | Attending: Thoracic Surgery (Cardiothoracic Vascular Surgery)

## 2022-08-30 ENCOUNTER — Encounter (HOSPITAL_COMMUNITY): Payer: Self-pay | Admitting: Thoracic Surgery (Cardiothoracic Vascular Surgery)

## 2022-08-30 ENCOUNTER — Inpatient Hospital Stay (HOSPITAL_COMMUNITY): Payer: Medicare Other | Admitting: Anesthesiology

## 2022-08-30 ENCOUNTER — Inpatient Hospital Stay (HOSPITAL_COMMUNITY)
Admission: RE | Admit: 2022-08-30 | Discharge: 2022-09-05 | DRG: 164 | Disposition: A | Discharge status: Home Health | Payer: Medicare Other | Attending: Thoracic Surgery (Cardiothoracic Vascular Surgery) | Admitting: Thoracic Surgery (Cardiothoracic Vascular Surgery)

## 2022-08-30 DIAGNOSIS — I959 Hypotension, unspecified: Secondary | ICD-10-CM | POA: Diagnosis not present

## 2022-08-30 DIAGNOSIS — D62 Acute posthemorrhagic anemia: Secondary | ICD-10-CM | POA: Diagnosis not present

## 2022-08-30 DIAGNOSIS — R112 Nausea with vomiting, unspecified: Secondary | ICD-10-CM | POA: Diagnosis not present

## 2022-08-30 DIAGNOSIS — I2510 Atherosclerotic heart disease of native coronary artery without angina pectoris: Secondary | ICD-10-CM | POA: Diagnosis present

## 2022-08-30 DIAGNOSIS — J9382 Other air leak: Secondary | ICD-10-CM | POA: Diagnosis not present

## 2022-08-30 DIAGNOSIS — I10 Essential (primary) hypertension: Secondary | ICD-10-CM | POA: Diagnosis present

## 2022-08-30 DIAGNOSIS — J40 Bronchitis, not specified as acute or chronic: Secondary | ICD-10-CM | POA: Diagnosis not present

## 2022-08-30 DIAGNOSIS — J982 Interstitial emphysema: Secondary | ICD-10-CM | POA: Diagnosis present

## 2022-08-30 DIAGNOSIS — D72829 Elevated white blood cell count, unspecified: Secondary | ICD-10-CM | POA: Diagnosis present

## 2022-08-30 DIAGNOSIS — Z8041 Family history of malignant neoplasm of ovary: Secondary | ICD-10-CM

## 2022-08-30 DIAGNOSIS — N179 Acute kidney failure, unspecified: Secondary | ICD-10-CM | POA: Diagnosis present

## 2022-08-30 DIAGNOSIS — K219 Gastro-esophageal reflux disease without esophagitis: Secondary | ICD-10-CM | POA: Diagnosis present

## 2022-08-30 DIAGNOSIS — R0602 Shortness of breath: Secondary | ICD-10-CM | POA: Diagnosis not present

## 2022-08-30 DIAGNOSIS — E039 Hypothyroidism, unspecified: Secondary | ICD-10-CM | POA: Diagnosis present

## 2022-08-30 DIAGNOSIS — J449 Chronic obstructive pulmonary disease, unspecified: Secondary | ICD-10-CM | POA: Diagnosis present

## 2022-08-30 DIAGNOSIS — Z96651 Presence of right artificial knee joint: Secondary | ICD-10-CM | POA: Diagnosis present

## 2022-08-30 DIAGNOSIS — G4733 Obstructive sleep apnea (adult) (pediatric): Secondary | ICD-10-CM | POA: Diagnosis present

## 2022-08-30 DIAGNOSIS — R918 Other nonspecific abnormal finding of lung field: Secondary | ICD-10-CM

## 2022-08-30 DIAGNOSIS — I444 Left anterior fascicular block: Secondary | ICD-10-CM | POA: Diagnosis present

## 2022-08-30 DIAGNOSIS — J9811 Atelectasis: Secondary | ICD-10-CM | POA: Diagnosis not present

## 2022-08-30 DIAGNOSIS — Z79899 Other long term (current) drug therapy: Secondary | ICD-10-CM | POA: Diagnosis not present

## 2022-08-30 DIAGNOSIS — Z800 Family history of malignant neoplasm of digestive organs: Secondary | ICD-10-CM

## 2022-08-30 DIAGNOSIS — Z8249 Family history of ischemic heart disease and other diseases of the circulatory system: Secondary | ICD-10-CM

## 2022-08-30 DIAGNOSIS — R911 Solitary pulmonary nodule: Secondary | ICD-10-CM | POA: Diagnosis present

## 2022-08-30 DIAGNOSIS — Z87891 Personal history of nicotine dependence: Secondary | ICD-10-CM

## 2022-08-30 DIAGNOSIS — J439 Emphysema, unspecified: Secondary | ICD-10-CM | POA: Diagnosis not present

## 2022-08-30 DIAGNOSIS — Z1152 Encounter for screening for COVID-19: Secondary | ICD-10-CM

## 2022-08-30 DIAGNOSIS — J189 Pneumonia, unspecified organism: Secondary | ICD-10-CM | POA: Diagnosis not present

## 2022-08-30 DIAGNOSIS — J90 Pleural effusion, not elsewhere classified: Secondary | ICD-10-CM | POA: Diagnosis not present

## 2022-08-30 DIAGNOSIS — J984 Other disorders of lung: Secondary | ICD-10-CM | POA: Diagnosis not present

## 2022-08-30 DIAGNOSIS — J939 Pneumothorax, unspecified: Secondary | ICD-10-CM | POA: Diagnosis not present

## 2022-08-30 DIAGNOSIS — Z881 Allergy status to other antibiotic agents status: Secondary | ICD-10-CM | POA: Diagnosis not present

## 2022-08-30 DIAGNOSIS — R55 Syncope and collapse: Secondary | ICD-10-CM | POA: Diagnosis not present

## 2022-08-30 HISTORY — PX: INTERCOSTAL NERVE BLOCK: SHX5021

## 2022-08-30 HISTORY — PX: LYMPH NODE DISSECTION: SHX5087

## 2022-08-30 SURGERY — WEDGE RESECTION, LUNG, ROBOT-ASSISTED, THORACOSCOPIC
Anesthesia: General | Site: Chest | Laterality: Right

## 2022-08-30 MED ORDER — IRBESARTAN 150 MG PO TABS: 150.0000 mg | ORAL_TABLET | Freq: Every day | ORAL | Status: DC

## 2022-08-30 MED ORDER — HEMOSTATIC AGENTS (NO CHARGE) OPTIME
TOPICAL | Status: DC | PRN
  Administered 2022-08-30: 1 via TOPICAL

## 2022-08-30 MED ORDER — ROCURONIUM BROMIDE 10 MG/ML (PF) SYRINGE
PREFILLED_SYRINGE | INTRAVENOUS | Status: AC
  Filled 2022-08-30: qty 10

## 2022-08-30 MED ORDER — BISACODYL 5 MG PO TBEC
10.0000 mg | DELAYED_RELEASE_TABLET | Freq: Every day | ORAL | Status: DC
  Administered 2022-08-30: 10 mg via ORAL
  Administered 2022-08-31 – 2022-09-02 (×2): 5 mg via ORAL
  Filled 2022-08-30 (×4): qty 2

## 2022-08-30 MED ORDER — FENTANYL CITRATE (PF) 100 MCG/2ML IJ SOLN
INTRAMUSCULAR | Status: AC
  Filled 2022-08-30: qty 2

## 2022-08-30 MED ORDER — ONDANSETRON HCL 4 MG/2ML IJ SOLN: 4.0000 mg | Freq: Once | INTRAMUSCULAR | Status: DC | PRN

## 2022-08-30 MED ORDER — KETOROLAC TROMETHAMINE 15 MG/ML IJ SOLN
15.0000 mg | Freq: Four times a day (QID) | INTRAMUSCULAR | Status: DC
  Administered 2022-08-30 – 2022-08-31 (×2): 15 mg via INTRAVENOUS
  Filled 2022-08-30 (×3): qty 1

## 2022-08-30 MED ORDER — MORPHINE SULFATE (PF) 2 MG/ML IV SOLN
2.0000 mg | INTRAVENOUS | Status: DC | PRN
  Administered 2022-08-31 – 2022-09-01 (×3): 2 mg via INTRAVENOUS
  Filled 2022-08-30 (×4): qty 1

## 2022-08-30 MED ORDER — SODIUM CHLORIDE FLUSH 0.9 % IV SOLN
INTRAVENOUS | Status: DC | PRN
  Administered 2022-08-30: 85 mL

## 2022-08-30 MED ORDER — LORAZEPAM 0.5 MG PO TABS
0.5000 mg | ORAL_TABLET | Freq: Every day | ORAL | Status: DC
  Administered 2022-08-31 – 2022-09-05 (×6): 0.5 mg via ORAL
  Filled 2022-08-30 (×6): qty 1

## 2022-08-30 MED ORDER — ENOXAPARIN SODIUM 40 MG/0.4ML IJ SOSY
40.0000 mg | PREFILLED_SYRINGE | Freq: Every day | INTRAMUSCULAR | Status: DC
  Administered 2022-08-31 – 2022-09-01 (×2): 40 mg via SUBCUTANEOUS
  Filled 2022-08-30 (×2): qty 0.4

## 2022-08-30 MED ORDER — PHENYLEPHRINE 80 MCG/ML (10ML) SYRINGE FOR IV PUSH (FOR BLOOD PRESSURE SUPPORT)
PREFILLED_SYRINGE | INTRAVENOUS | Status: DC | PRN
  Administered 2022-08-30: 80 ug via INTRAVENOUS
  Administered 2022-08-30: 160 ug via INTRAVENOUS

## 2022-08-30 MED ORDER — ONDANSETRON HCL 4 MG/2ML IJ SOLN
INTRAMUSCULAR | Status: AC
  Filled 2022-08-30: qty 2

## 2022-08-30 MED ORDER — PHENYLEPHRINE 80 MCG/ML (10ML) SYRINGE FOR IV PUSH (FOR BLOOD PRESSURE SUPPORT)
PREFILLED_SYRINGE | INTRAVENOUS | Status: AC
  Filled 2022-08-30: qty 10

## 2022-08-30 MED ORDER — LACTATED RINGERS IV SOLN: INTRAVENOUS | Status: DC

## 2022-08-30 MED ORDER — 0.9 % SODIUM CHLORIDE (POUR BTL) OPTIME
TOPICAL | Status: DC | PRN
  Administered 2022-08-30: 2000 mL

## 2022-08-30 MED ORDER — PANTOPRAZOLE SODIUM 40 MG PO TBEC
40.0000 mg | DELAYED_RELEASE_TABLET | Freq: Every day | ORAL | Status: DC
  Administered 2022-08-31 – 2022-09-05 (×5): 40 mg via ORAL
  Filled 2022-08-30 (×6): qty 1

## 2022-08-30 MED ORDER — SODIUM CHLORIDE 0.9 % IV SOLN
INTRAVENOUS | Status: AC | PRN
  Administered 2022-08-30: 1000 mL

## 2022-08-30 MED ORDER — CHLORHEXIDINE GLUCONATE CLOTH 2 % EX PADS
6.0000 | MEDICATED_PAD | Freq: Every day | CUTANEOUS | Status: DC
  Administered 2022-08-30: 6 via TOPICAL

## 2022-08-30 MED ORDER — DEXAMETHASONE SODIUM PHOSPHATE 10 MG/ML IJ SOLN
INTRAMUSCULAR | Status: AC
  Filled 2022-08-30: qty 1

## 2022-08-30 MED ORDER — BUPIVACAINE LIPOSOME 1.3 % IJ SUSP
INTRAMUSCULAR | Status: AC
  Filled 2022-08-30: qty 20

## 2022-08-30 MED ORDER — ACETAMINOPHEN 500 MG PO TABS
1000.0000 mg | ORAL_TABLET | Freq: Once | ORAL | Status: AC
  Administered 2022-08-30: 1000 mg via ORAL
  Filled 2022-08-30: qty 2

## 2022-08-30 MED ORDER — CHLORHEXIDINE GLUCONATE 0.12 % MT SOLN
15.0000 mL | Freq: Once | OROMUCOSAL | Status: AC
  Administered 2022-08-30: 15 mL via OROMUCOSAL
  Filled 2022-08-30: qty 15

## 2022-08-30 MED ORDER — BUPROPION HCL ER (XL) 150 MG PO TB24
150.0000 mg | ORAL_TABLET | Freq: Every day | ORAL | Status: DC
  Administered 2022-08-31 – 2022-09-05 (×6): 150 mg via ORAL
  Filled 2022-08-30 (×6): qty 1

## 2022-08-30 MED ORDER — FENTANYL CITRATE (PF) 100 MCG/2ML IJ SOLN
25.0000 ug | INTRAMUSCULAR | Status: DC | PRN
  Administered 2022-08-30 (×3): 50 ug via INTRAVENOUS

## 2022-08-30 MED ORDER — PROPOFOL 10 MG/ML IV BOLUS
INTRAVENOUS | Status: DC | PRN
  Administered 2022-08-30: 200 mg via INTRAVENOUS

## 2022-08-30 MED ORDER — SENNOSIDES-DOCUSATE SODIUM 8.6-50 MG PO TABS
1.0000 | ORAL_TABLET | Freq: Every day | ORAL | Status: DC
  Administered 2022-08-30 – 2022-09-03 (×5): 1 via ORAL
  Filled 2022-08-30 (×6): qty 1

## 2022-08-30 MED ORDER — ACETAMINOPHEN 500 MG PO TABS
1000.0000 mg | ORAL_TABLET | Freq: Four times a day (QID) | ORAL | Status: AC
  Administered 2022-08-30 – 2022-09-04 (×17): 1000 mg via ORAL
  Filled 2022-08-30 (×18): qty 2

## 2022-08-30 MED ORDER — ROCURONIUM BROMIDE 10 MG/ML (PF) SYRINGE
PREFILLED_SYRINGE | INTRAVENOUS | Status: DC | PRN
  Administered 2022-08-30: 40 mg via INTRAVENOUS
  Administered 2022-08-30: 60 mg via INTRAVENOUS

## 2022-08-30 MED ORDER — ORAL CARE MOUTH RINSE: 15.0000 mL | Freq: Once | OROMUCOSAL | Status: AC

## 2022-08-30 MED ORDER — FENTANYL CITRATE (PF) 250 MCG/5ML IJ SOLN
INTRAMUSCULAR | Status: DC | PRN
  Administered 2022-08-30: 50 ug via INTRAVENOUS
  Administered 2022-08-30: 100 ug via INTRAVENOUS

## 2022-08-30 MED ORDER — VANCOMYCIN HCL IN DEXTROSE 1-5 GM/200ML-% IV SOLN
1000.0000 mg | INTRAVENOUS | Status: AC
  Administered 2022-08-30: 1000 mg via INTRAVENOUS
  Filled 2022-08-30: qty 200

## 2022-08-30 MED ORDER — LIDOCAINE 2% (20 MG/ML) 5 ML SYRINGE
INTRAMUSCULAR | Status: AC
  Filled 2022-08-30: qty 5

## 2022-08-30 MED ORDER — PHENYLEPHRINE HCL-NACL 20-0.9 MG/250ML-% IV SOLN
INTRAVENOUS | Status: DC | PRN
  Administered 2022-08-30: 40 ug/min via INTRAVENOUS

## 2022-08-30 MED ORDER — AMISULPRIDE (ANTIEMETIC) 5 MG/2ML IV SOLN: 10.0000 mg | Freq: Once | INTRAVENOUS | Status: DC | PRN

## 2022-08-30 MED ORDER — OXYCODONE HCL 5 MG PO TABS
5.0000 mg | ORAL_TABLET | ORAL | Status: DC | PRN
  Administered 2022-08-30: 5 mg via ORAL
  Administered 2022-09-01 – 2022-09-03 (×6): 10 mg via ORAL
  Filled 2022-08-30 (×6): qty 2

## 2022-08-30 MED ORDER — FENTANYL CITRATE (PF) 250 MCG/5ML IJ SOLN
INTRAMUSCULAR | Status: AC
  Filled 2022-08-30: qty 5

## 2022-08-30 MED ORDER — ACETAMINOPHEN 160 MG/5ML PO SOLN
1000.0000 mg | Freq: Four times a day (QID) | ORAL | Status: AC
  Filled 2022-08-30: qty 40.6

## 2022-08-30 MED ORDER — ONDANSETRON HCL 4 MG/2ML IJ SOLN
4.0000 mg | Freq: Four times a day (QID) | INTRAMUSCULAR | Status: DC | PRN
  Administered 2022-08-31 (×2): 4 mg via INTRAVENOUS
  Filled 2022-08-30 (×2): qty 2

## 2022-08-30 MED ORDER — PROPOFOL 10 MG/ML IV BOLUS
INTRAVENOUS | Status: AC
  Filled 2022-08-30: qty 20

## 2022-08-30 MED ORDER — VANCOMYCIN HCL IN DEXTROSE 1-5 GM/200ML-% IV SOLN
1000.0000 mg | Freq: Two times a day (BID) | INTRAVENOUS | Status: AC
  Administered 2022-08-30: 1000 mg via INTRAVENOUS
  Filled 2022-08-30: qty 200

## 2022-08-30 MED ORDER — LACTATED RINGERS IV SOLN: INTRAVENOUS | Status: DC | PRN

## 2022-08-30 MED ORDER — BUPIVACAINE HCL (PF) 0.5 % IJ SOLN
INTRAMUSCULAR | Status: AC
  Filled 2022-08-30: qty 30

## 2022-08-30 MED ORDER — DEXAMETHASONE SODIUM PHOSPHATE 10 MG/ML IJ SOLN
INTRAMUSCULAR | Status: DC | PRN
  Administered 2022-08-30: 10 mg via INTRAVENOUS

## 2022-08-30 MED ORDER — SODIUM CHLORIDE 0.45 % IV SOLN: INTRAVENOUS | Status: DC

## 2022-08-30 MED ORDER — LIDOCAINE 2% (20 MG/ML) 5 ML SYRINGE
INTRAMUSCULAR | Status: DC | PRN
  Administered 2022-08-30: 60 mg via INTRAVENOUS

## 2022-08-30 MED ORDER — SUGAMMADEX SODIUM 200 MG/2ML IV SOLN
INTRAVENOUS | Status: DC | PRN
  Administered 2022-08-30: 200 mg via INTRAVENOUS

## 2022-08-30 MED ORDER — ONDANSETRON HCL 4 MG/2ML IJ SOLN
INTRAMUSCULAR | Status: DC | PRN
  Administered 2022-08-30: 4 mg via INTRAVENOUS

## 2022-08-30 MED ORDER — OXYCODONE HCL 5 MG PO TABS
ORAL_TABLET | ORAL | Status: AC
  Filled 2022-08-30: qty 1

## 2022-08-30 SURGICAL SUPPLY — 101 items
ADH SKN CLS APL DERMABOND .7 (GAUZE/BANDAGES/DRESSINGS) ×2
APPLIER CLIP ROT 10 11.4 M/L (STAPLE)
APR CLP MED LRG 11.4X10 (STAPLE)
BAG SPEC RTRVL C125 8X14 (MISCELLANEOUS) ×2
BLADE CLIPPER SURG (BLADE) ×2 IMPLANT
CANISTER SUCT 3000ML PPV (MISCELLANEOUS) ×4 IMPLANT
CANNULA REDUC XI 12-8 STAPL (CANNULA) ×4
CANNULA REDUCER 12-8 DVNC XI (CANNULA) ×4 IMPLANT
CLIP APPLIE ROT 10 11.4 M/L (STAPLE) IMPLANT
CNTNR URN SCR LID CUP LEK RST (MISCELLANEOUS) ×10 IMPLANT
CONN ST 1/4X3/8  BEN (MISCELLANEOUS) ×2
CONN ST 1/4X3/8 BEN (MISCELLANEOUS) IMPLANT
CONT SPEC 4OZ STRL OR WHT (MISCELLANEOUS) ×30
DEFOGGER SCOPE WARMER CLEARIFY (MISCELLANEOUS) ×2 IMPLANT
DERMABOND ADVANCED .7 DNX12 (GAUZE/BANDAGES/DRESSINGS) ×2 IMPLANT
DRAIN CHANNEL 28F RND 3/8 FF (WOUND CARE) IMPLANT
DRAIN CHANNEL 32F RND 10.7 FF (WOUND CARE) IMPLANT
DRAPE ARM DVNC X/XI (DISPOSABLE) ×8 IMPLANT
DRAPE COLUMN DVNC XI (DISPOSABLE) ×2 IMPLANT
DRAPE CV SPLIT W-CLR ANES SCRN (DRAPES) ×2 IMPLANT
DRAPE DA VINCI XI ARM (DISPOSABLE) ×8
DRAPE DA VINCI XI COLUMN (DISPOSABLE) ×2
DRAPE HALF SHEET 40X57 (DRAPES) ×2 IMPLANT
DRAPE INCISE IOBAN 66X45 STRL (DRAPES) IMPLANT
DRAPE ORTHO SPLIT 77X108 STRL (DRAPES) ×2
DRAPE SURG ORHT 6 SPLT 77X108 (DRAPES) ×2 IMPLANT
ELECT BLADE 6.5 EXT (BLADE) IMPLANT
ELECT REM PT RETURN 9FT ADLT (ELECTROSURGICAL) ×2
ELECTRODE REM PT RTRN 9FT ADLT (ELECTROSURGICAL) ×2 IMPLANT
GAUZE KITTNER 4X5 RF (MISCELLANEOUS) ×4 IMPLANT
GAUZE SPONGE 4X4 12PLY STRL (GAUZE/BANDAGES/DRESSINGS) ×2 IMPLANT
GLOVE SS BIOGEL STRL SZ 7.5 (GLOVE) ×2 IMPLANT
GOWN STRL REUS W/ TWL LRG LVL3 (GOWN DISPOSABLE) ×4 IMPLANT
GOWN STRL REUS W/ TWL XL LVL3 (GOWN DISPOSABLE) ×4 IMPLANT
GOWN STRL REUS W/TWL 2XL LVL3 (GOWN DISPOSABLE) ×2 IMPLANT
GOWN STRL REUS W/TWL LRG LVL3 (GOWN DISPOSABLE) ×4
GOWN STRL REUS W/TWL XL LVL3 (GOWN DISPOSABLE) ×4
HEMOSTAT SURGICEL 2X14 (HEMOSTASIS) ×6 IMPLANT
IRRIGATION STRYKERFLOW (MISCELLANEOUS) ×2 IMPLANT
IRRIGATOR STRYKERFLOW (MISCELLANEOUS) ×2
KIT BASIN OR (CUSTOM PROCEDURE TRAY) ×2 IMPLANT
KIT SUCTION CATH 14FR (SUCTIONS) IMPLANT
KIT TURNOVER KIT B (KITS) ×2 IMPLANT
NDL HYPO 25GX1X1/2 BEV (NEEDLE) ×2 IMPLANT
NDL SPNL 22GX3.5 QUINCKE BK (NEEDLE) ×2 IMPLANT
NEEDLE HYPO 25GX1X1/2 BEV (NEEDLE) ×2 IMPLANT
NEEDLE SPNL 22GX3.5 QUINCKE BK (NEEDLE) ×2 IMPLANT
NS IRRIG 1000ML POUR BTL (IV SOLUTION) ×2 IMPLANT
PACK CHEST (CUSTOM PROCEDURE TRAY) ×2 IMPLANT
PAD ARMBOARD 7.5X6 YLW CONV (MISCELLANEOUS) ×4 IMPLANT
PORT ACCESS TROCAR AIRSEAL 12 (TROCAR) ×2 IMPLANT
RELOAD STAPLE 45 2.5 WHT DVNC (STAPLE) IMPLANT
RELOAD STAPLE 45 3.5 BLU DVNC (STAPLE) IMPLANT
RELOAD STAPLE 45 4.3 GRN DVNC (STAPLE) IMPLANT
RELOAD STAPLE 45 4.6 BLK DVNC (STAPLE) IMPLANT
RELOAD STAPLER 2.5X45 WHT DVNC (STAPLE) IMPLANT
RELOAD STAPLER 3.5X45 BLU DVNC (STAPLE) ×12 IMPLANT
RELOAD STAPLER 4.3X45 GRN DVNC (STAPLE) ×10 IMPLANT
RELOAD STAPLER 45 4.6 BLK DVNC (STAPLE) ×2 IMPLANT
SCISSORS LAP 5X35 DISP (ENDOMECHANICALS) IMPLANT
SEAL CANN UNIV 5-8 DVNC XI (MISCELLANEOUS) ×4 IMPLANT
SEAL XI 5MM-8MM UNIVERSAL (MISCELLANEOUS) ×4
SET TRI-LUMEN FLTR TB AIRSEAL (TUBING) ×2 IMPLANT
SOLUTION ELECTROLUBE (MISCELLANEOUS) ×2 IMPLANT
SPONGE TONSIL 1 RF SGL (DISPOSABLE) IMPLANT
STAPLER 45 SUREFORM CVD (STAPLE) ×2
STAPLER 45 SUREFORM CVD DVNC (STAPLE) IMPLANT
STAPLER CANNULA SEAL DVNC XI (STAPLE) ×4 IMPLANT
STAPLER CANNULA SEAL XI (STAPLE) ×4
STAPLER RELOAD 2.5X45 WHITE (STAPLE)
STAPLER RELOAD 2.5X45 WHT DVNC (STAPLE)
STAPLER RELOAD 3.5X45 BLU DVNC (STAPLE) ×12
STAPLER RELOAD 3.5X45 BLUE (STAPLE) ×12
STAPLER RELOAD 4.3X45 GREEN (STAPLE) ×10
STAPLER RELOAD 4.3X45 GRN DVNC (STAPLE) ×10
STAPLER RELOAD 45 4.6 BLK (STAPLE) ×2
STAPLER RELOAD 45 4.6 BLK DVNC (STAPLE) ×2
SUT PDS AB 3-0 SH 27 (SUTURE) IMPLANT
SUT PROLENE 4 0 RB 1 (SUTURE)
SUT PROLENE 4-0 RB1 .5 CRCL 36 (SUTURE) IMPLANT
SUT SILK  1 MH (SUTURE) ×6
SUT SILK 1 MH (SUTURE) ×4 IMPLANT
SUT SILK 2 0 SH (SUTURE) IMPLANT
SUT SILK 2 0SH CR/8 30 (SUTURE) IMPLANT
SUT SILK 3 0SH CR/8 30 (SUTURE) IMPLANT
SUT VIC AB 1 CTX 27 (SUTURE) IMPLANT
SUT VIC AB 1 CTX 36 (SUTURE)
SUT VIC AB 1 CTX36XBRD ANBCTR (SUTURE) IMPLANT
SUT VIC AB 2-0 CTX 36 (SUTURE) IMPLANT
SUT VIC AB 3-0 X1 27 (SUTURE) ×4 IMPLANT
SUT VICRYL 0 TIES 12 18 (SUTURE) ×2 IMPLANT
SUT VICRYL 0 UR6 27IN ABS (SUTURE) ×4 IMPLANT
SYR 20ML LL LF (SYRINGE) ×4 IMPLANT
SYSTEM RETRIEVAL ANCHOR 8 (MISCELLANEOUS) IMPLANT
SYSTEM SAHARA CHEST DRAIN ATS (WOUND CARE) ×2 IMPLANT
TAPE CLOTH 4X10 WHT NS (GAUZE/BANDAGES/DRESSINGS) ×2 IMPLANT
TAPE CLOTH SURG 4X10 WHT LF (GAUZE/BANDAGES/DRESSINGS) IMPLANT
TIP APPLICATOR SPRAY EXTEND 16 (VASCULAR PRODUCTS) IMPLANT
TOWEL GREEN STERILE (TOWEL DISPOSABLE) ×4 IMPLANT
TRAY FOLEY MTR SLVR 16FR STAT (SET/KITS/TRAYS/PACK) ×2 IMPLANT
WATER STERILE IRR 1000ML POUR (IV SOLUTION) ×2 IMPLANT

## 2022-08-30 NOTE — Hospital Course (Addendum)
HPI:   David Buck is an 82 year old man with a history of tobacco abuse, COPD, MAC, hypertension, hypothyroidism, left anterior fascicular block, Mnire's disease, pneumonia, vertigo, syncope, aortic and coronary atherosclerosis, thyroid orbitopathy, reflux, and obstructive sleep apnea.   He has been followed by Dr. Baird Lyons for COPD and a history of Mycobacterium avium complex infection.  He has had a history of waxing and waning lung nodules.  He has had 2 negative bronchoscopies in the past.  A CT this past fall showed a new 13 mm right upper lobe lung nodule.  On PET/CT the nodule was hypermetabolic.  He was referred to Dr. Lamonte Sakai.  He felt that given the high index of suspicion that surgical resection would be preferred to bronchoscopy.   He has a history of tobacco abuse smoking about a pack of cigarettes daily for 50 years prior to quitting 2 years ago.  He does have a history of COPD and is on an inhaler.  He does not ever use the inhaler.  He gets short of breath with heavy exertion but not with walking normally.  He can walk up a flight of stairs without having to stop.  No chest pain, pressure, or tightness.  No unusual headaches or visual changes.  He does have impaired vision due to thyroid disease.  Dr. Roxan Hockey reviewed the patient's diagnostic studies and determined surgical intervention would benefit this patient. He discussed the patient's options as well as the risks and benefits of surgery. David Buck was agreeable to proceed with surgery.  Hospital Course: David Buck arrived at [facility] and was brought to the operating room on 08/30/22. He underwent robotic assisted right upper lobe wedge resection. He tolerated the procedure well and was transferred to PACU in stable condition. CXR on POD1 showed an increased moderate to large right sided pneumothorax with some left medistinal shift. Chest tube had an air leak. His chest tube was set to suction. The  patient  transferred from the bed to the chair and likely had an episode of vomiting followed by syncope, hypotension and bradycardia. He was arousable. He was transferred back to the bed and given an IV fluid bolus as well as albumin. His vital signs quickly stabilized. His Olmesartan was held. Follow up CXR showed a larger right sided pneumothorax without tension components. His creatinine was elevated to 1.66, Toradol was discontinued. His H/H dropped from 10.6/31.9 to 8.9/26.5 then to 7.8/24.4. He was started on an iron supplement. His creatinine improved to 1.42. He had 900cc of bloody drainage out of his chest tubes. Lovenox was discontinued. CXR showed a small amount of loculated fluid in the right apex but no pneumothorax. He had no air leak, Chest tubes were placed to one Pleurevac and placed to waterseal. His postoperative blood loss anemia stabilized and chest tube drainage decreased. He continued to have no air leak and no pneumothorax on CXR. His creatinine decreased to 1.12, AKI had resolved. Chest tubes were removed on POD 4 without complication. Follow up CXR showed a possible trace right apical pneumothorax. PT/OT evaluation was ordered and recommended home health PT which was arranged accordingly. He developed a cough with sputum production but without leukocytosis or fever, he was started on a 3 day course of Zithromax for bronchitis. CXR showed no definite pneumothorax and a stable unchanged loculated right pleural effusion as well as stable subcutaneous emphysema. His H/H improved to 8.1/23.6. His blood pressure was controlled without Olmesartan, it was held until office  follow up with Dr. Roxan Hockey. He developed extensive ecchymosis of his right side surrounding old chest tube site, likely due to history of bleeding POD1, but his pain was well controlled. His incisions were healing well without sign of infection. He was saturating well on room air and was ambulating well. He was felt stable  for discharge home.

## 2022-08-30 NOTE — Interval H&P Note (Signed)
History and Physical Interval Note:  08/30/2022 10:09 AM  David Buck  has presented today for surgery, with the diagnosis of RUL NODULE.  The various methods of treatment have been discussed with the patient and family. After consideration of risks, benefits and other options for treatment, the patient has consented to  Procedure(s): XI ROBOTIC ASSISTED THORACOSCOPY-RIGHT UPPER LOBE WEDGE RESECTION, possible lobectomy (Right) as a surgical intervention.  The patient's history has been reviewed, patient examined, no change in status, stable for surgery.  I have reviewed the patient's chart and labs.  Questions were answered to the patient's satisfaction.     Melrose Nakayama

## 2022-08-30 NOTE — Anesthesia Procedure Notes (Signed)
Procedure Name: Intubation Date/Time: 08/30/2022 11:17 AM  Performed by: Heide Scales, CRNAPre-anesthesia Checklist: Patient identified, Emergency Drugs available, Suction available and Patient being monitored Patient Re-evaluated:Patient Re-evaluated prior to induction Oxygen Delivery Method: Circle system utilized Preoxygenation: Pre-oxygenation with 100% oxygen Induction Type: IV induction Ventilation: Mask ventilation without difficulty and Oral airway inserted - appropriate to patient size Laryngoscope Size: Mac and 4 Grade View: Grade I Tube type: Oral Endobronchial tube: Left, Double lumen EBT and EBT position confirmed by fiberoptic bronchoscope and 39 Fr Number of attempts: 1 Airway Equipment and Method: Stylet and Oral airway Placement Confirmation: ETT inserted through vocal cords under direct vision, positive ETCO2 and breath sounds checked- equal and bilateral Secured at: 31 cm Tube secured with: Tape Dental Injury: Teeth and Oropharynx as per pre-operative assessment

## 2022-08-30 NOTE — Anesthesia Procedure Notes (Signed)
Arterial Line Insertion Start/End3/12/2022 10:15 AM, 08/30/2022 10:29 AM Performed by: Valda Favia, CRNA  Patient location: Pre-op. Preanesthetic checklist: patient identified, IV checked, site marked, risks and benefits discussed, surgical consent, monitors and equipment checked, pre-op evaluation, timeout performed and anesthesia consent Lidocaine 1% used for infiltration Left, radial was placed Catheter size: 20 G Hand hygiene performed  and maximum sterile barriers used   Attempts: 2 (attempted x 1 by Junie Bame, CRNA would not thread) Procedure performed without using ultrasound guided technique. Following insertion, dressing applied and Biopatch. Post procedure assessment: normal and unchanged

## 2022-08-30 NOTE — Anesthesia Preprocedure Evaluation (Addendum)
Anesthesia Evaluation  Patient identified by MRN, date of birth, ID band Patient awake    Reviewed: Allergy & Precautions, NPO status , Patient's Chart, lab work & pertinent test results  Airway Mallampati: II  TM Distance: >3 FB Neck ROM: Full    Dental  (+) Upper Dentures   Pulmonary sleep apnea , COPD, former smoker   Pulmonary exam normal        Cardiovascular hypertension, Pt. on medications Normal cardiovascular exam     Neuro/Psych  PSYCHIATRIC DISORDERS Anxiety Depression    TIA   GI/Hepatic negative GI ROS, Neg liver ROS,,,  Endo/Other  negative endocrine ROS    Renal/GU negative Renal ROS     Musculoskeletal  (+) Arthritis ,    Abdominal   Peds  Hematology negative hematology ROS (+)   Anesthesia Other Findings RUL NODULE  Reproductive/Obstetrics                             Anesthesia Physical Anesthesia Plan  ASA: 3  Anesthesia Plan: General   Post-op Pain Management:    Induction: Intravenous  PONV Risk Score and Plan: 2 and Ondansetron, Dexamethasone and Treatment may vary due to age or medical condition  Airway Management Planned: Double Lumen EBT  Additional Equipment: Arterial line  Intra-op Plan:   Post-operative Plan: Extubation in OR  Informed Consent: I have reviewed the patients History and Physical, chart, labs and discussed the procedure including the risks, benefits and alternatives for the proposed anesthesia with the patient or authorized representative who has indicated his/her understanding and acceptance.     Dental advisory given  Plan Discussed with: CRNA  Anesthesia Plan Comments:        Anesthesia Quick Evaluation

## 2022-08-30 NOTE — Anesthesia Postprocedure Evaluation (Signed)
Anesthesia Post Note  Patient: David Buck  Procedure(s) Performed: XI ROBOTIC ASSISTED THORACOSCOPY-RIGHT UPPER LOBE WEDGE RESECTION, (Right: Chest) INTERCOSTAL NERVE BLOCK LYMPH NODE DISSECTION     Patient location during evaluation: PACU Anesthesia Type: General Level of consciousness: awake Pain management: pain level controlled Vital Signs Assessment: post-procedure vital signs reviewed and stable Respiratory status: spontaneous breathing, nonlabored ventilation and respiratory function stable Cardiovascular status: blood pressure returned to baseline and stable Postop Assessment: no apparent nausea or vomiting Anesthetic complications: no   No notable events documented.  Last Vitals:  Vitals:   08/30/22 1753 08/30/22 2013  BP: (!) 146/85   Pulse: 67 72  Resp: 20 16  Temp:  36.6 C  SpO2: 93% 93%    Last Pain:  Vitals:   08/30/22 2013  TempSrc: Oral  PainSc:                  [name]

## 2022-08-30 NOTE — Transfer of Care (Signed)
Immediate Anesthesia Transfer of Care Note  Patient: David Buck  Procedure(s) Performed: XI ROBOTIC ASSISTED THORACOSCOPY-RIGHT UPPER LOBE WEDGE RESECTION, (Right: Chest) INTERCOSTAL NERVE BLOCK LYMPH NODE DISSECTION  Patient Location: PACU  Anesthesia Type:General  Level of Consciousness: drowsy and patient cooperative  Airway & Oxygen Therapy: Patient Spontanous Breathing and Patient connected to nasal cannula oxygen  Post-op Assessment: Report given to RN and Post -op Vital signs reviewed and stable  Post vital signs: Reviewed and stable  Last Vitals:  Vitals Value Taken Time  BP 118/66 08/30/22 1422  Temp    Pulse 63 08/30/22 1425  Resp 16 08/30/22 1425  SpO2 95 % 08/30/22 1425  Vitals shown include unvalidated device data.  Last Pain:  Vitals:   08/30/22 1000  TempSrc:   PainSc: 0-No pain         Complications: No notable events documented.

## 2022-08-30 NOTE — Op Note (Signed)
David Buck, David Buck MEDICAL RECORD NO: [id number] ACCOUNT NO: [id number] DATE OF BIRTH: 1940-10-02 FACILITY: [facility] LOCATION: [facility] PHYSICIAN: David Standard. 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  POSTOPERATIVE DIAGNOSIS:  Inflammatory nodule, right upper lobe.  PROCEDURE:   Xi robotic-assisted right thoracoscopy,  Right upper and lower lobe wedge resections,  Lymph node sampling,  Intercostal nerve blocks levels 3 through 10.  SURGEON:  David Standard. Roxan Hockey, MD  ASSISTANT:  Wynelle Beckmann, PA  ANESTHESIA:  General.  FINDINGS:  Adhesions at apex and the fissure, invagination of visceral pleura in right upper lobe with the nodule.  Adhesions to the superior segment in area of nodule.  Frozen section revealed inflammatory nodule and adjacent fibrotic nodule. No tumor or definite granulomas seen.  Small right lower lobe nodules.  CLINICAL NOTE:  David Buck is an 82 year old man with a history of tobacco abuse and MAC infection.  He has been followed for waxing and waning lung nodules for several years and has had two negative biopsies in the past. In the fall of 2023, a CT showed a new 13 mm right upper lobe lung nodule.  He had a followup with a PET/CT, which confirmed the nodule and showed that it was hypermetabolic.  There were multiple other smaller nodules as well.  He was referred for surgical resection for definitive diagnosis and treatment.  The indications, risks, benefits, and alternatives were discussed in detail with the patient.  He understood the plan was to do a wedge resection and then possibly a lobectomy depending on the results of the intraoperative frozen section.  He accepted the risks and agreed to proceed.  OPERATIVE NOTE:  David Buck was brought to the preoperative holding area on 08/30/2022. The anesthesia service placed an arterial blood pressure  monitoring line and established intravenous access.  He was taken to the operating room and anesthetized and intubated with a double lumen endotracheal tube.  Intravenous antibiotics were administered.  A Foley catheter was placed.  Sequential compression devices were placed on the calves for DVT prophylaxis.  He was placed in the left lateral decubitus position.  A Bair Hugger was placed for active warming.  The right chest was prepped and draped in the usual sterile fashion.  Single lung ventilation of the left lung was initiated and was tolerated well throughout the procedure.  A timeout was performed.  A solution containing 20 mL of liposomal bupivacaine, 30 mL of 0.5% bupivacaine and 50 mL of saline was prepared.  This solution was used for local at the incision sites and the intercostal nerve blocks.  An incision was made in the eighth interspace in the midaxillary line, and an 8 mm port was inserted.  The thoracoscope was placed into the chest.  There were some adhesions of the right upper lobe to the chest wall.  After confirming intrapleural placement, carbon dioxide was insufflated per protocol.  A 12 mm robotic port was placed in the eighth interspace anterior to the camera port.  A 12 mm Air-Seal port was placed in the tenth interspace centered between the two anterior ports.  Intercostal nerve blocks then were performed from the fourth to the tenth interspace, injecting 10 mL of the bupivacaine solution into the subpleural plane at each level.  Two additional eighth interspace robotic ports were placed.  The robot was deployed.  The camera arm was docked, and targeting was performed.  The remaining arms were docked.  The robotic instruments were inserted with thoracoscopic visualization.  The adhesions of the right upper lobe to the lateral chest wall were taken down with bipolar cautery.  These were thin  filmy adhesions.  There were additional adhesions to the apex, which were divided later in the  procedure.  While waiting for the lung to deflate, the lower lobe was retracted superiorly and the lymph node dissection was begun,   dividing the inferior ligament with bipolar cautery.  Level 9 node was removed.  The pleural reflection was divided at the hilum posteriorly and there were multiple level 7 nodes, some of which were quite enlarged in that area.  Although enlarged, there was nothing to suggest gross tumor involvement.  The level 11 node was dissected out at the bifurcation of the upper lobe bronchus from the bronchus intermedius.  By this point, the lung had deflated enough to allow wedge resection to be performed.  There were adhesions in the fissure. Some thin filmy adhesions were taken down and then in the area of the nodule where there was invagination of the visceral pleura of the right upper lobe, there were some adhesions that were more substantive and the decision was made to take  a small portion of the lower lobe along with the biopsy.  This was done using the robotic stapler.  This then exposed some additional adhesions again to the superior segment of the lower lobe and again a small piece of the superior segment was taken using the robotic stapler.  The wedge resection then was performed with sequential firings of the robotic stapler, encompassing the area of the nodularity.  The specimen was placed into an endoscopic retrieval bag, removed, and sent for frozen section of the nodule and the staple lines on the small portions of the lower lobe that were resected en bloc with the upper lobe wedge resection.  Those margins were marked with the sutures. The primary nodule was marked with a suture and an adjacent palpable nodule was marked as well.  The specimen was sent for frozen section.  While awaiting the results of the frozen section, the apical adhesions were taken down.  These adhesions were much more vascular.  Again, they were taken down with bipolar cautery.  There was extensive  scarring in the apex.  The pleural reflection then was divided at the hilum superiorly and then the pleura overlying the mediastinum superior to the azygos vein was incised and level 4R node was removed. There was minimal bleeding in this area.  The frozen section returned showing an inflammatory lesion at the main nodule, fibrosis at the secondary nodule, both of the staple lines on the small portions of the lower lobe were negative.  Given the absence of malignancy, there was no indication to  perform a lobectomy as discussed with the patient preoperatively.  Inspection of the lower lobe did not reveal any dominant nodules, but there were a couple of small subpleural nodules in close proximity and these were removed with a wedge resection using the robotic stapler. One of these nodules was sent for AFB and fungal cultures.  The other was sent for permanent pathology.  The chest was copiously irrigated with warm saline.  A test inflation was performed to 30 cm of water pressure.  There was no air leak from the staple lines.  The apex had scarring and some bleeding and a small parenchymal tear.  This area was resected using the robotic  stapler and sent for permanent pathology.  The chest was copiously irrigated with saline.  The sponges that had been placed during the procedure were removed.  The robotic instruments were removed.  The robot was undocked.  The scope was used to inspect all of the port sites for hemostasis and there was no ongoing bleeding.  A 28-French Blake drain was placed through the anterior port incision and secured with #1 silk suture.  Dual lung ventilation was resumed.  The remaining incisions were closed in standard fashion.  The chest tube was placed to a Pleur-evac on waterseal.  The patient was extubated in the operating room and taken to the postanesthetic care unit in good condition.  All sponge, needle and instrument counts were correct at the end of the procedure.  Experienced  assistance was necessary for this case due to surgical complexity. Wynelle Beckmann served as Licensed conveyancer, providing assistance with port placement, robot docking and undocking, instrument exchange, specimen retrieval,suctioning, and wound closure.   SHW D: 08/30/2022 5:27:26 pm T: 08/30/2022 8:40:00 pm  JOB: [id number]/ [id number]

## 2022-08-30 NOTE — Brief Op Note (Addendum)
08/30/2022  2:24 PM  PATIENT:  David Buck  82 y.o. male  PRE-OPERATIVE DIAGNOSIS:  RIGHT UPPER LOBE NODULE  POST-OPERATIVE DIAGNOSIS:  INFLAMMATORY RIGHT UPPER LOBE NODULE  PROCEDURE:  XI ROBOTIC ASSISTED THORACOSCOPY-RIGHT UPPER LOBE WEDGE RESECTION, (Right) LYMPH NODE SAMPLING INTERCOSTAL NERVE BLOCK   SURGEON:  Surgeon(s) and Role:    * Melrose Nakayama, MD - Primary  PHYSICIAN ASSISTANT: Wynelle Beckmann PA-C  ASSISTANTS: none   ANESTHESIA:   local and general  EBL:  100 mL   BLOOD ADMINISTERED:none  DRAINS:  Right pleural tube    LOCAL MEDICATIONS USED:  OTHER Exparel  SPECIMEN:  Source of Specimen:  Right lower lobe wedge, right lower lobe nodule  DISPOSITION OF SPECIMEN:  Pathology, culture  COUNTS CORRECT:  YES  DICTATION: done  PLAN OF CARE: Admit to inpatient   PATIENT DISPOSITION:  PACU - hemodynamically stable.   Delay start of Pharmacological VTE agent (>24hrs) due to surgical blood loss or risk of bleeding: no

## 2022-08-31 ENCOUNTER — Inpatient Hospital Stay (HOSPITAL_COMMUNITY): Payer: Medicare Other

## 2022-08-31 ENCOUNTER — Encounter (HOSPITAL_COMMUNITY): Payer: Self-pay | Admitting: Thoracic Surgery (Cardiothoracic Vascular Surgery)

## 2022-08-31 LAB — BASIC METABOLIC PANEL
Anion gap: 7 (ref 5–15)
BUN: 29 mg/dL — ABNORMAL HIGH (ref 8–23)
CO2: 26 mmol/L (ref 22–32)
Calcium: 8.6 mg/dL — ABNORMAL LOW (ref 8.9–10.3)
Chloride: 102 mmol/L (ref 98–111)
Creatinine, Ser: 1.66 mg/dL — ABNORMAL HIGH (ref 0.61–1.24)
GFR, Estimated: 41 mL/min — ABNORMAL LOW (ref >60)
Glucose, Bld: 129 mg/dL — ABNORMAL HIGH (ref 70–99)
Potassium: 4.8 mmol/L (ref 3.5–5.1)
Sodium: 135 mmol/L (ref 135–145)

## 2022-08-31 LAB — CBC
HCT: 26.5 % — ABNORMAL LOW (ref 39.0–52.0)
HCT: 31.9 % — ABNORMAL LOW (ref 39.0–52.0)
Hemoglobin: 10.6 g/dL — ABNORMAL LOW (ref 13.0–17.0)
Hemoglobin: 8.9 g/dL — ABNORMAL LOW (ref 13.0–17.0)
MCH: 31.5 pg (ref 26.0–34.0)
MCH: 32.4 pg (ref 26.0–34.0)
MCHC: 33.2 g/dL (ref 30.0–36.0)
MCHC: 33.6 g/dL (ref 30.0–36.0)
MCV: 94.9 fL (ref 80.0–100.0)
MCV: 96.4 fL (ref 80.0–100.0)
Platelets: 204 10*3/uL (ref 150–400)
Platelets: 247 10*3/uL (ref 150–400)
RBC: 2.75 MIL/uL — ABNORMAL LOW (ref 4.22–5.81)
RBC: 3.36 MIL/uL — ABNORMAL LOW (ref 4.22–5.81)
RDW: 12.4 % (ref 11.5–15.5)
RDW: 12.6 % (ref 11.5–15.5)
WBC: 10.7 10*3/uL — ABNORMAL HIGH (ref 4.0–10.5)
WBC: 12.2 10*3/uL — ABNORMAL HIGH (ref 4.0–10.5)
nRBC: 0 % (ref 0.0–0.2)
nRBC: 0 % (ref 0.0–0.2)

## 2022-08-31 LAB — GLUCOSE, CAPILLARY: Glucose-Capillary: 156 mg/dL — ABNORMAL HIGH (ref 70–99)

## 2022-08-31 MED ORDER — LIDOCAINE HCL 1 % IJ SOLN
20.0000 mL | Freq: Once | INTRAMUSCULAR | Status: AC
  Administered 2022-08-31: 20 mL
  Filled 2022-08-31: qty 20

## 2022-08-31 MED ORDER — ALBUMIN HUMAN 5 % IV SOLN
25.0000 g | Freq: Once | INTRAVENOUS | Status: AC
  Administered 2022-08-31: 25 g via INTRAVENOUS
  Filled 2022-08-31: qty 500

## 2022-08-31 MED ORDER — SODIUM CHLORIDE 0.9 % IV BOLUS: 1000.0000 mL | Freq: Once | INTRAVENOUS | Status: DC

## 2022-08-31 MED ORDER — ALBUMIN HUMAN 5 % IV SOLN
12.5000 g | Freq: Once | INTRAVENOUS | Status: AC
  Administered 2022-08-31: 12.5 g via INTRAVENOUS
  Filled 2022-08-31: qty 250

## 2022-08-31 MED ORDER — SODIUM CHLORIDE 0.9 % IV BOLUS: 500.0000 mL | Freq: Once | INTRAVENOUS | Status: DC

## 2022-08-31 MED ORDER — ALBUMIN HUMAN 5 % IV SOLN
25.0000 g | Freq: Once | INTRAVENOUS | Status: DC
  Filled 2022-08-31: qty 500

## 2022-08-31 NOTE — Discharge Instructions (Signed)
Robot-Assisted Thoracic Surgery, Care After The following information offers guidance on how to care for yourself after your procedure. Your health care provider may also give you more specific instructions. If you have problems or questions, contact your health care provider. What can I expect after the procedure? After the procedure, it is common to have: Some pain and aches in the area of your surgical incisions. Pain when breathing in (inhaling) and coughing. Tiredness (fatigue). Trouble sleeping. Constipation. Follow these instructions at home: Medicines Take over-the-counter and prescription medicines only as told by your health care provider. If you were prescribed an antibiotic medicine, take it as told by your health care provider. Do not stop taking the antibiotic even if you start to feel better. Talk with your health care provider about safe and effective ways to manage pain after your procedure. Pain management should fit your specific health needs. Take pain medicine before pain becomes severe. Relieving and controlling your pain will make breathing easier for you. Ask your health care provider if the medicine prescribed to you requires you to avoid driving or using machinery. Eating and drinking Follow instructions from your health care provider about eating or drinking restrictions. These will vary depending on what procedure you had. Your health care provider may recommend: A liquid diet or soft diet for the first few days. Meals that are smaller and more frequent. A diet of fruits, vegetables, whole grains, and low-fat proteins. Limiting foods that are high in fat and processed sugar, including fried or sweet foods. Incision care Follow instructions from your health care provider about how to take care of your incisions. Make sure you: Wash your hands with soap and water for at least 20 seconds before and after you change your bandage (dressing). If soap and water are not  available, use hand sanitizer. Change your dressing as told by your health care provider. Leave stitches (sutures), skin glue, or adhesive strips in place. These skin closures may need to stay in place for 2 weeks or longer. If adhesive strip edges start to loosen and curl up, you may trim the loose edges. Do not remove adhesive strips completely unless your health care provider tells you to do that. Check your incision area every day for signs of infection. Check for: Redness, swelling, or more pain. Fluid or blood. Warmth. Pus or a bad smell. Activity Return to your normal activities as told by your health care provider. Ask your health care provider what activities are safe for you. Ask your health care provider when it is safe for you to drive. Do not lift anything that is heavier than 10 lb (4.5 kg), or the limit that you are told, until your health care provider says that it is safe. Rest as told by your health care provider. Avoid sitting for a long time without moving. Get up to take short walks every 1-2 hours. This is important to improve blood flow and breathing. Ask for help if you feel weak or unsteady. Do exercises as told by your health care provider. Pneumonia prevention  Do deep breathing exercises and cough regularly as directed. This helps clear mucus and opens your lungs. Doing this helps prevent lung infection (pneumonia). If you were given an incentive spirometer, use it as told. An incentive spirometer is a tool that measures how well you are filling your lungs with each breath. Coughing may hurt less if you try to support your chest. This is called splinting. Try one of these when you  cough: Hold a pillow against your chest. Place the palms of both hands on top of your incision area. Do not use any products that contain nicotine or tobacco. These products include cigarettes, chewing tobacco, and vaping devices, such as e-cigarettes. If you need help quitting, ask your  health care provider. Avoid secondhand smoke. General instructions If you have a drainage tube: Follow instructions from your health care provider about how to take care of it. Do not travel by airplane after your tube is removed until your health care provider tells you it is safe. You may need to take these actions to prevent or treat constipation: Drink enough fluid to keep your urine pale yellow. Take over-the-counter or prescription medicines. Eat foods that are high in fiber, such as beans, whole grains, and fresh fruits and vegetables. Limit foods that are high in fat and processed sugars, such as fried or sweet foods. Keep all follow-up visits. This is important. Contact a health care provider if: You have redness, swelling, or more pain around an incision. You have fluid or blood coming from an incision. An incision feels warm to the touch. You have pus or a bad smell coming from an incision. You have a fever. You cannot eat or drink without vomiting. Your pain medicine is not controlling your pain. Get help right away if: You have chest pain. Your heart is beating quickly. You have trouble breathing. You have trouble speaking. You are confused. You feel weak or dizzy, or you faint. These symptoms may represent a serious problem that is an emergency. Do not wait to see if the symptoms will go away. Get medical help right away. Call your local emergency services (911 in the [place] Do not drive yourself to the hospital. Summary Talk with your health care provider about safe and effective ways to manage pain after your procedure. Pain management should fit your specific health needs. Return to your normal activities as told by your health care provider. Ask your health care provider what activities are safe for you. Do deep breathing exercises and cough regularly as directed. This helps to clear mucus and prevent pneumonia. If it hurts to cough, ease pain by holding a pillow  against your chest or by placing the palms of both hands over your incisions. This information is not intended to replace advice given to you by your health care provider. Make sure you discuss any questions you have with your health care provider. Document Revised: 03/04/2020 Document Reviewed: 03/04/2020 Elsevier Patient Education  [place] may shower, please wash incisions daily with soap and water and keep dry.  If you wish to cover wounds with dressing you may do so but please keep clean and change daily.  No tub baths or swimming until incisions have completely healed.  If your incisions become red or develop any drainage please call our office at [phone]  If any questions or concerns arise, please do not hesitate to contact our office at [phone]

## 2022-08-31 NOTE — Significant Event (Signed)
Rapid Response Event Note   Reason for Call :  Pt unresponsive once up to chair.   Initial Focused Assessment:  On arrival, pt sitting in chair, lethargic but arousable. Initial BP on arrival 57/33, HR 59, spO2 86% on RA. Pt appeared pale, warm and diaphoretic. N/V present- zofran IV given.   CT site with moderate amounts of serosanguineous drainage in atrium and around insertion site. SubQ emphysema noted to R chest which is new per primary RN.    Pt mentation back to baseline and BP improved fairly quickly. See flowsheets. VSS upon end of event.     Interventions:  CBG- 156 1L Bolus [place]- O2 2L placed CXR '4mg'$  Zofran Pt placed back in bed.  Albumin CBC  Plan of Care:  Continue to monitor pt status. Pt instucted to stay in bed. Will finish bolus then start maintenance fluids. Awaiting CXR results. Primary RN instructed to call with any changes or concerns.    Event Summary:   MD Notified: CTS- Barrett- PA paged and to bedside Call Time: 0815 Arrival Time: 0817 End Time: 0845  Sherilyn Dacosta, RN

## 2022-08-31 NOTE — TOC Progression Note (Addendum)
Transition of Care [facility]) - Progression Note    Patient Details  Name: David Buck MRN: [id number] Date of Birth: 1940-08-09  Transition of [facility]) CM/SW Contact  Zenon Mayo, RN Phone Number: 08/31/2022, 8:27 AM  Clinical Narrative:    From [place] [facility], s/p XI ROBOTIC ASSISTED THORACOSCOPY-RIGHT UPPER LOBE WEDGE RESECTION , conts on chest tube.         Expected Discharge Plan and Services                                               Social Determinants of Health (SDOH) Interventions SDOH Screenings   Food Insecurity: No Food Insecurity (06/28/2022)  Housing: Low Risk  (06/28/2022)  Transportation Needs: No Transportation Needs (06/28/2022)  Utilities: Not At Risk (06/28/2022)  Alcohol Screen: Low Risk  (06/28/2022)  Depression (PHQ2-9): Low Risk  (06/28/2022)  Financial Resource Strain: Low Risk  (06/28/2022)  Physical Activity: Sufficiently Active (06/28/2022)  Social Connections: Moderately Isolated (06/28/2022)  Stress: No Stress Concern Present (06/28/2022)  Tobacco Use: Medium Risk (08/31/2022)    Readmission Risk Interventions     No data to display

## 2022-08-31 NOTE — Progress Notes (Signed)
Assisted with right CT placement at bedside with Dr Roxan Hockey. Timeout completed, consent signed and in chart. VSS throughout procedure. Pigtail catheter placed without issue. Air leak noted. CXR done. Plan to connect CT to suction in 72mns per Md. Primary RN made aware. Instructed to call with any questions or concerns.   NDaleen Bo Rapid Response RN

## 2022-08-31 NOTE — Progress Notes (Signed)
[address]       [place] [address]             [phone]      1 Day Post-Op Procedure(s) (LRB): XI ROBOTIC ASSISTED THORACOSCOPY-RIGHT UPPER LOBE WEDGE RESECTION, (Right) INTERCOSTAL NERVE BLOCK LYMPH NODE DISSECTION Subjective: Upon arrival the nurse states the patient transferred to the chair and had an episode of nausea and vomiting as well as becoming unresponsive briefly. He also became hypotensive and bradycardic. He was lethargic but arousable. The patient was alert and oriented and in no acute distress when I arrived, he remembers vomiting but cannot remember after that. Wife states he did not fall as he was in the chair. He was transferred back to the bed and his blood pressure and heart rate quickly stabilized.   Objective: Vital signs in last 24 hours: Temp:  [97.6 F (36.4 C)-99 F (37.2 C)] 98.2 F (36.8 C) (03/08 1130) Pulse Rate:  [53-111] 61 (03/08 1130) Cardiac Rhythm: Normal sinus rhythm (03/08 0734) Resp:  [10-24] 20 (03/08 1130) BP: (57-146)/(33-85) 93/56 (03/08 1130) SpO2:  [86 %-100 %] 96 % (03/08 1130) Arterial Line BP: (120-129)/(45-55) 121/45 (03/07 1530)  Hemodynamic parameters for last 24 hours:    Intake/Output from previous day: 03/07 0701 - 03/08 0700 In: 2617.4 [I.V.:2617.4] Out: 590 [Urine:450; Blood:100; Chest Tube:40] Intake/Output this shift: Total I/O In: 500 [IV Piggyback:500] Out: 100 [Chest Tube:100]  General appearance: alert, cooperative, and no distress Neurologic: intact Heart: Bradycardia-NSR, no murmur Lungs: Diminished right sided breath sounds, right sided crepitus Abdomen: soft, non-tender; bowel sounds normal; no masses,  no organomegaly Extremities: extremities normal, atraumatic, no cyanosis or edema Wound: Chest tube incision site with serosanguinous drainage as well as serosanguinous drainage from the tube, new dressing placed while in the room  Lab Results: Recent Labs    08/29/22 1445  08/31/22 0015  WBC 6.5 10.7*  HGB 12.8* 10.6*  HCT 40.2 31.9*  PLT 278 247   BMET:  Recent Labs    08/29/22 1445 08/31/22 0015  NA 138 135  K 4.2 4.8  CL 106 102  CO2 23 26  GLUCOSE 97 129*  BUN 23 29*  CREATININE 1.02 1.66*  CALCIUM 9.7 8.6*    PT/INR:  Recent Labs    08/29/22 1445  LABPROT 12.9  INR 1.0   ABG    Component Value Date/Time   PHART 7.44 08/29/2022 1405   HCO3 27.2 08/29/2022 1405   O2SAT 98.1 08/29/2022 1405   CBG (last 3)  Recent Labs    08/31/22 0814  GLUCAP 156*    Assessment/Plan: S/P Procedure(s) (LRB): XI ROBOTIC ASSISTED THORACOSCOPY-RIGHT UPPER LOBE WEDGE RESECTION, (Right) INTERCOSTAL NERVE BLOCK LYMPH NODE DISSECTION  Neuro: Patient alert and oriented, not lethargic when I arrived. Pain controlled. He experienced a brief episode of unresponsiveness, likely syncopal episode. He was arousable, transferred back to the bed and vital signs stabilized.  CV: HR 60s when in the room, had reached 50s per nursing staff. Episode of hypotension 57/33 with likely syncopal episode upon transfer to chair. Pt was started on IV NS bolus, BP quickly rose to 111/68. Albumin was also given. Continue IV 1/2 NS 148m/hr. Will hold ARB.  Pulm: CXR this AM shows increased moderate to large right apical pneumothorax with some leftward shift. Chest tube in place, possible kink? CT with air leak, chest tube was placed to suction. STAT CXR ordered after syncopal episode showed increased right pneumothorax without tension  or mediastinal shift. CT output recorded 40cc output last shift but 100cc so far this shift, large amount of serosanguinous drainage when patient transferred to chair. Prior to the episode he was saturating low 90s on RA, now saturating well on 2L [place].  GI: Episode of nausea with vomiting. Continue Zofran. Increase diet as tolerated.  Renal: Cr 1.66 this AM. Likely due to Toradol, will discontinue Toradol. UO 450cc/24 hours  ID: Leukocytosis WBC  10.7, Tmax 99, will monitor.  Expected postop ABLA: H/H 10.6/31.9. Will monitor.  DVT prophylaxis: Lovenox  Dispo: Leave CT to suction, will get follow up CXR this afternoon. D/C Toradol. Continue IV Fluids.    LOS: 1 day    Magdalene River, PA-C 08/31/2022

## 2022-08-31 NOTE — Discharge Summary (Addendum)
Physician Discharge Summary  Patient ID: David Buck MRN: [id number] DOB/AGE: 81-Aug-1942 81 y.o.  Admit date: 08/30/2022 Discharge date: 09/05/2022  Admission Diagnoses: Right upper lobe pulmonary nodule  Discharge Diagnoses:  Principal Problem:   Right upper lobe pulmonary nodule   Discharged Condition: stable  HPI:   David Buck is an 82 year old man with a history of tobacco abuse, COPD, MAC, hypertension, hypothyroidism, left anterior fascicular block, Mnire's disease, pneumonia, vertigo, syncope, aortic and coronary atherosclerosis, thyroid orbitopathy, reflux, and obstructive sleep apnea.   He has been followed by Dr. Baird Lyons for COPD and a history of Mycobacterium avium complex infection.  He has had a history of waxing and waning lung nodules.  He has had 2 negative bronchoscopies in the past.  A CT this past fall showed a new 13 mm right upper lobe lung nodule.  On PET/CT the nodule was hypermetabolic.  He was referred to Dr. Lamonte Sakai.  He felt that given the high index of suspicion that surgical resection would be preferred to bronchoscopy.   He has a history of tobacco abuse smoking about a pack of cigarettes daily for 50 years prior to quitting 2 years ago.  He does have a history of COPD and is on an inhaler.  He does not ever use the inhaler.  He gets short of breath with heavy exertion but not with walking normally.  He can walk up a flight of stairs without having to stop.  No chest pain, pressure, or tightness.  No unusual headaches or visual changes.  He does have impaired vision due to thyroid disease.  Dr. Roxan Hockey reviewed the patient's diagnostic studies and determined surgical intervention would benefit this patient. He discussed the patient's options as well as the risks and benefits of surgery. David Buck was agreeable to proceed with surgery.  Hospital Course: David Buck arrived at [facility] and was brought to the operating room on  08/30/22. He underwent robotic assisted right upper lobe wedge resection. He tolerated the procedure well and was transferred to PACU in stable condition. CXR on POD1 showed an increased moderate to large right sided pneumothorax with some left medistinal shift. Chest tube had an air leak. His chest tube was set to suction. The patient  transferred from the bed to the chair and likely had an episode of vomiting followed by syncope, hypotension and bradycardia. He was arousable. He was transferred back to the bed and given an IV fluid bolus as well as albumin. His vital signs quickly stabilized. His Olmesartan was held. Follow up CXR showed a larger right sided pneumothorax without tension components. His creatinine was elevated to 1.66, Toradol was discontinued. His H/H dropped from 10.6/31.9 to 8.9/26.5 then to 7.8/24.4. He was started on an iron supplement. His creatinine improved to 1.42. He had 900cc of bloody drainage out of his chest tubes. Lovenox was discontinued. CXR showed a small amount of loculated fluid in the right apex but no pneumothorax. He had no air leak, Chest tubes were placed to one Pleurevac and placed to waterseal. His postoperative blood loss anemia stabilized and chest tube drainage decreased. He continued to have no air leak and no pneumothorax on CXR. His creatinine decreased to 1.12, AKI had resolved. Chest tubes were removed on POD 4 without complication. Follow up CXR showed a possible trace right apical pneumothorax. PT/OT evaluation was ordered and recommended home health PT which was arranged accordingly. He developed a cough with sputum production but without leukocytosis or  fever, he was started on a 3 day course of Zithromax for bronchitis. CXR showed no definite pneumothorax and a stable unchanged loculated right pleural effusion as well as stable subcutaneous emphysema. His H/H improved to 8.1/23.6. His blood pressure was controlled without Olmesartan, it was held until office  follow up with Dr. Roxan Hockey. He developed extensive ecchymosis of his right side surrounding old chest tube site, likely due to history of bleeding POD1, but his pain was well controlled. His incisions were healing well without sign of infection. He was saturating well on room air and was ambulating well. He was felt stable for discharge home.  Consults: None  Significant Diagnostic Studies:   CLINICAL DATA:  Initial treatment strategy for pulmonary nodule.   EXAM: NUCLEAR MEDICINE PET SKULL BASE TO THIGH   TECHNIQUE: 0.5 mCi F-18 FDG was injected intravenously. Full-ring PET imaging was performed from the skull base to thigh after the radiotracer. CT data was obtained and used for attenuation correction and anatomic localization.   Fasting blood glucose: 105 mg/dl   COMPARISON:  None Available.   FINDINGS: Mediastinal blood pool activity: SUV max 2.4   Liver activity: SUV max NA   NECK: No hypermetabolic lymph nodes in the neck.   Incidental CT findings: None.   CHEST: Irregular solid pulmonary nodule of the right upper lobe measuring 15 mm, SUV max of 4.6. Of the left kidneyNumerous additional smaller pulmonary nodules are seen no significant FDG uptake. Solid nodule of the right lower lobe measuring 6 mm on series 9, image 67, SUV max of 3.4. Two adjacent solid pulmonary nodules of the right upper right lower lobe measuring 3 mm and 4 mm on series 9 image 52, SUV max of 1.5. Additional small pulmonary nodules below resolution for PET-CT. No enlarged lymph nodes seen in the chest.   Incidental CT findings: Emphysema. Moderate atherosclerotic disease of the thoracic aorta. Mild coronary artery calcifications.   ABDOMEN/PELVIS: No abnormal hypermetabolic activity within the liver, pancreas, adrenal glands, or spleen. No hypermetabolic lymph nodes in the abdomen or pelvis.   Incidental CT findings: Simple appearing cyst of the upper pole of the left kidney.  Indeterminate hyperdense exophytic left renal lesion measuring 2.0 cm lesion of the posterior right kidney measuring 1.3 cm no suspicious FDG uptake. Prostatomegaly. Diverticulosis. Moderate atherosclerotic disease of the abdominal aorta   SKELETON: No focal hypermetabolic activity to suggest skeletal metastasis.   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   IMPRESSION: 1. Dominant hypermetabolic spiculated solid pulmonary nodule of the right upper lobe, nodule has slowly enlarged when compared with multiple prior exams dating back to September 21, 2015 and is suspicious for primary lung neoplasm. 2. Additional scattered small pulmonary nodules, largest in the right lower lobe demonstrate mild hypermetabolic activity. Some nodules are new, others are decreased in size or resolved when compared with most recent prior. Findings are most likely due to chronic atypical infection. 3. No evidence of metastatic disease in the chest, abdomen or pelvis. 4. Indeterminate hyperdense exophytic renal lesions. Follow-up CT or MRI with and without contrast recommended to further evaluate. 5. Aortic Atherosclerosis (ICD10-I70.0).     Electronically Signed   By: Yetta Glassman M.D.   On: 08/09/2022 09:43  CLINICAL DATA:  Pneumothorax   EXAM: PORTABLE CHEST 1 VIEW   COMPARISON:  Chest x-ray March 7, 24.  A   FINDINGS:  Increased size of a right apical pneumothorax, now moderate to large. There is some new leftward mediastinal shift. Right chest tube in place. Mild opacities in the right lung, likely atelectasis. Left lung is clear. No visible pleural effusions. Subcutaneous emphysema in the right chest wall.   IMPRESSION: 1. Increased size of a right apical pneumothorax, now moderate to large. There is some new leftward mediastinal shift. Correlate with clinical  symptoms tension physiology. 2. Right chest tube in place.   These results will be called to the ordering clinician or representative by the Radiologist Assistant, and communication documented in the PACS or [employer].     Electronically Signed   By: Margaretha Sheffield M.D.   On: 08/31/2022 08:20  CLINICAL DATA:  Short of breath, right lung surgery, chest tube   EXAM: PORTABLE CHEST 1 VIEW   COMPARISON:  08/31/2022 at 6:08 a.m.   FINDINGS: Single frontal view of the chest demonstrates a stable cardiac silhouette. There is a persistent large right apical pneumothorax, slightly increased in size since prior study. Volume estimated greater than 50%. Right chest tube remains in place, tip overlying the right suprahilar region. No significant mediastinal shift to suggest tension effect.   Interval progression of diffuse subcutaneous emphysema, most pronounced throughout the right lateral chest. Interval development of pneumomediastinum. Left chest is clear. No acute bony abnormality.   IMPRESSION: 1. Slight increase in size of the large right apical pneumothorax, volume estimated greater than 50%. No significant tension effect or mediastinal shift on this exam. 2. Progression of diffuse subcutaneous emphysema, with interval development of pneumomediastinum, related to the right pneumothorax described above. 3. Stable position of the right chest tube.   These results will be called to the ordering clinician or representative by the Radiologist Assistant, and communication documented in the PACS or [employer].     Electronically Signed   By: Randa Ngo M.D.   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   POSTOPERATIVE DIAGNOSIS:  Inflammatory nodule, right upper lobe.   PROCEDURE:  Xi robotic-assisted  thoracoscopy, right upper and lower lobe wedge resections, lymph node sampling, intercostal nerve blocks levels 3 through 10.   SURGEON:  Revonda Standard. Roxan Hockey, MD  PATHOLOGY: Pending  Discharge Exam: Blood pressure 121/86, pulse (!) 110, temperature 98.7 F (37.1 C), temperature source Oral, resp. rate 16, height 6\' 2"  ([id number] m), weight 78.9 kg, SpO2 90 %. General appearance: alert, cooperative, and no distress Neurologic: intact Heart: regular rate and rhythm, S1, S2 normal, no murmur, click, rub or gallop Lungs: Minimally diminished right sided breath sounds Abdomen: soft, non-tender; bowel sounds normal; no masses,  no organomegaly Extremities: extremities normal, atraumatic, no cyanosis or edema Wound: Extensive ecchymosis surrounding right chest tube site and down right side, incisions are clean and dry without sign of infection  Disposition: Discharge disposition: 06-Home-Health Care Svc        Allergies as of 09/05/2022       Reactions   Cefdinir Rash   Cephalosporins Rash        Medication List     STOP taking these medications    olmesartan 20 MG tablet Commonly known as: BENICAR       TAKE these medications    albuterol 108 (90 Base) MCG/ACT inhaler Commonly known as: VENTOLIN HFA Inhale 2 puffs into the lungs every 4 (four) hours as  needed for wheezing or shortness of breath.   ascorbic acid 500 MG tablet Commonly known as: VITAMIN C Take 500 mg by mouth daily.   azithromycin 500 MG tablet Commonly known as: ZITHROMAX Take 1 tablet (500 mg total) by mouth daily.   buPROPion 150 MG 24 hr tablet Commonly known as: Wellbutrin XL Take 1 tablet (150 mg total) by mouth daily.   EYLEA IO Inject into the eye See admin instructions. Every four weeks   GLUCOSAMINE HCL PO Take 1,000 mg by mouth daily.   loratadine 10 MG tablet Commonly known as: CLARITIN Take 1 tablet by mouth daily as needed for allergies.   LORazepam 0.5 MG tablet Commonly  known as: ATIVAN TAKE ONE TABLET BY MOUTH TWICE A DAY AS NEEDED FOR ANXIETY What changed:  how much to take how to take this when to take this additional instructions   multivitamin with minerals Tabs tablet Take 1 tablet by mouth every evening.   ondansetron 4 MG tablet Commonly known as: Zofran Take 1 tablet (4 mg total) by mouth every 8 (eight) hours as needed for nausea or vomiting.   oxyCODONE 5 MG immediate release tablet Commonly known as: Oxy IR/ROXICODONE Take 1 tablet (5 mg total) by mouth every 6 (six) hours as needed for moderate pain.   pantoprazole 40 MG tablet Commonly known as: PROTONIX Take 1 tablet (40 mg total) by mouth daily. What changed:  when to take this reasons to take this   Systane Ultra 0.4-0.3 % Soln Generic drug: Polyethyl Glycol-Propyl Glycol Place 1 drop into both eyes every evening.   Vitamin D3 50 MCG (2000 UT) Tabs Generic drug: Cholecalciferol Take 2,000 Units by mouth every evening.   ZINC PICOLINATE PO Take 22 mg by mouth every evening.               Durable Medical Equipment  (From admission, onward)           Start     Ordered   09/03/22 1250  For home use only DME Walker rolling  Once       Question Answer Comment  Walker: With [place] [employer]   Patient needs a walker to treat with the following condition Physical deconditioning   Patient needs a walker to treat with the following condition S/P partial lobectomy of lung      09/03/22 1249            Follow-up Information     Melrose Nakayama, MD Follow up on 09/11/2022.   Specialty: Cardiothoracic Surgery Why: Follow up appointment is at 3:00PM Contact information: [place] [address] [phone]         [facility] Follow up on 09/11/2022.   Why: To get chest xray at 2:00PM Contact information: [place]                Signed: Magdalene River,  PA-C  09/05/2022, 12:47 PM

## 2022-09-01 ENCOUNTER — Inpatient Hospital Stay (HOSPITAL_COMMUNITY): Payer: Medicare Other

## 2022-09-01 LAB — COMPREHENSIVE METABOLIC PANEL
ALT: 20 U/L (ref 0–44)
AST: 22 U/L (ref 15–41)
Albumin: 2.9 g/dL — ABNORMAL LOW (ref 3.5–5.0)
Alkaline Phosphatase: 36 U/L — ABNORMAL LOW (ref 38–126)
Anion gap: 4 — ABNORMAL LOW (ref 5–15)
BUN: 25 mg/dL — ABNORMAL HIGH (ref 8–23)
CO2: 24 mmol/L (ref 22–32)
Calcium: 8.2 mg/dL — ABNORMAL LOW (ref 8.9–10.3)
Chloride: 107 mmol/L (ref 98–111)
Creatinine, Ser: 1.42 mg/dL — ABNORMAL HIGH (ref 0.61–1.24)
GFR, Estimated: 50 mL/min — ABNORMAL LOW (ref >60)
Glucose, Bld: 114 mg/dL — ABNORMAL HIGH (ref 70–99)
Potassium: 4.1 mmol/L (ref 3.5–5.1)
Sodium: 135 mmol/L (ref 135–145)
Total Bilirubin: 0.8 mg/dL (ref 0.3–1.2)
Total Protein: 4.8 g/dL — ABNORMAL LOW (ref 6.5–8.1)

## 2022-09-01 LAB — CBC
HCT: 24.4 % — ABNORMAL LOW (ref 39.0–52.0)
Hemoglobin: 7.8 g/dL — ABNORMAL LOW (ref 13.0–17.0)
MCH: 30.8 pg (ref 26.0–34.0)
MCHC: 32 g/dL (ref 30.0–36.0)
MCV: 96.4 fL (ref 80.0–100.0)
Platelets: 174 10*3/uL (ref 150–400)
RBC: 2.53 MIL/uL — ABNORMAL LOW (ref 4.22–5.81)
RDW: 12.4 % (ref 11.5–15.5)
WBC: 7.5 10*3/uL (ref 4.0–10.5)
nRBC: 0 % (ref 0.0–0.2)

## 2022-09-01 MED ORDER — FE FUM-VIT C-VIT B12-FA 460-60-0.01-1 MG PO CAPS
1.0000 | ORAL_CAPSULE | Freq: Every day | ORAL | Status: DC
  Administered 2022-09-01 – 2022-09-04 (×4): 1 via ORAL
  Filled 2022-09-01 (×5): qty 1

## 2022-09-01 MED ORDER — GABAPENTIN 100 MG PO CAPS
100.0000 mg | ORAL_CAPSULE | Freq: Three times a day (TID) | ORAL | Status: DC
  Administered 2022-09-01 – 2022-09-05 (×13): 100 mg via ORAL
  Filled 2022-09-01 (×13): qty 1

## 2022-09-01 NOTE — Progress Notes (Addendum)
[address]       [place] [address]             [phone]      2 Days Post-Op Procedure(s) (LRB): XI ROBOTIC ASSISTED THORACOSCOPY-RIGHT UPPER LOBE WEDGE RESECTION, (Right) INTERCOSTAL NERVE BLOCK LYMPH NODE DISSECTION Subjective: Patient states he has some pain this morning but is doing ok.  Objective: Vital signs in last 24 hours: Temp:  [98.1 F (36.7 C)-99.5 F (37.5 C)] 98.1 F (36.7 C) (03/09 0737) Pulse Rate:  [53-111] 77 (03/09 0737) Cardiac Rhythm: Normal sinus rhythm (03/08 2000) Resp:  [15-24] 16 (03/09 0737) BP: (57-130)/(33-81) 130/66 (03/09 0737) SpO2:  [86 %-99 %] 93 % (03/09 0737)  Hemodynamic parameters for last 24 hours:    Intake/Output from previous day: 03/08 0701 - 03/09 0700 In: 2935.1 [P.O.:240; I.V.:2159.4; IV Piggyback:535.7] Out: 3010 [Urine:2110; Chest Tube:900] Intake/Output this shift: Total I/O In: -  Out: 200 [Urine:200]  General appearance: alert, cooperative, and no distress Neurologic: intact Heart: regular rate and rhythm, S1, S2 normal, no murmur, click, rub or gallop Lungs: Diminished right sided breath sounds Abdomen: soft, non-tender; bowel sounds normal; no masses,  no organomegaly Extremities: extremities normal, atraumatic, no cyanosis or edema Wound: Chest tube dressing slightly saturated, pigtail drain dressing clean and dry. Incisions clean and dry without erythema or sign of infection.   Lab Results: Recent Labs    08/31/22 1034 09/01/22 0016  WBC 12.2* 7.5  HGB 8.9* 7.8*  HCT 26.5* 24.4*  PLT 204 174   BMET:  Recent Labs    08/31/22 0015 09/01/22 0016  NA 135 135  K 4.8 4.1  CL 102 107  CO2 26 24  GLUCOSE 129* 114*  BUN 29* 25*  CREATININE 1.66* 1.42*  CALCIUM 8.6* 8.2*    PT/INR:  Recent Labs    08/29/22 1445  LABPROT 12.9  INR 1.0   ABG    Component Value Date/Time   PHART 7.44 08/29/2022 1405   HCO3 27.2 08/29/2022 1405   O2SAT 98.1 08/29/2022 1405   CBG  (last 3)  Recent Labs    08/31/22 0814  GLUCAP 156*    Assessment/Plan: S/P Procedure(s) (LRB): XI ROBOTIC ASSISTED THORACOSCOPY-RIGHT UPPER LOBE WEDGE RESECTION, (Right) INTERCOSTAL NERVE BLOCK LYMPH NODE DISSECTION  Neuro: Patient has some pain this morning. D/C'd Toradol due to rise in creatinine. Will start gabapentin, continue schedule Tylenol, Oxycodone PRN and Morphine PRN.    CV: HR 70s, NSR. SBP 130 this AM. Continue to hold ARB   Pulm: Increased pneumothorax on CXR yesterday not improved with CT to suction. Pigtail was placed at the bedside by Dr. Roxan Hockey. Both chest tubes placed to suction. CT with 900cc/24hrs, bloody drainage from pigtail, serosanguinous drainage from blake drain. CXR this AM shows RUL opacity. Looks like pneumothorax has improved, pending final read. No air leak on exam, patient with poor cough effort due to pain. Saturating 93% on 3L [place]. Encourage IS and flutter valve as well as deep breathing.     GI: Tolerating a diet   Renal: Cr down to 1.42 from 1.66 this AM. Elevation likely due to Toradol, Toradol discontinued. UO 2110cc/24 hours. Continue IV fluids?   ID: Leukocytosis resolved WBC 7.5, Tmax 99.5, will monitor.   Expected postop ABLA: H/H 7.8/24.4. Nearing transfusion threshold, will start iron supplement and monitor closely.    DVT prophylaxis: Lovenox   Dispo: Leave CTs to suction. Large amount of bloody drainage in the past  24 hours.     LOS: 2 days    Magdalene River, PA-C 09/01/2022  Patient seen and examined, agree with above No air leak- will combine tubes to a single pleuravac and place on water seal Clearly bled yesterday- Hgb down and a large amount of fluid in canister- stop enoxaparin, continue SCD for DVT prophylaxis No signs of ongoing bleeding at this point Continue to monitor Anemia secondary to ABL- monitor AKI- creatinine better this AM  Remo Lipps C. Roxan Hockey, MD [facility] [phone]

## 2022-09-01 NOTE — Plan of Care (Signed)
  Problem: Education: Goal: Knowledge of disease or condition will improve Outcome: Progressing   Problem: Cardiac: Goal: Will achieve and/or maintain hemodynamic stability Outcome: Progressing   Problem: Clinical Measurements: Goal: Postoperative complications will be avoided or minimized Outcome: Progressing   Problem: Respiratory: Goal: Respiratory status will improve Outcome: Progressing   Problem: Pain Management: Goal: Pain level will decrease Outcome: Progressing   Problem: Clinical Measurements: Goal: Ability to maintain clinical measurements within normal limits will improve Outcome: Progressing

## 2022-09-02 ENCOUNTER — Inpatient Hospital Stay (HOSPITAL_COMMUNITY): Payer: Medicare Other

## 2022-09-02 LAB — CBC
HCT: 23.2 % — ABNORMAL LOW (ref 39.0–52.0)
Hemoglobin: 7.7 g/dL — ABNORMAL LOW (ref 13.0–17.0)
MCH: 31.7 pg (ref 26.0–34.0)
MCHC: 33.2 g/dL (ref 30.0–36.0)
MCV: 95.5 fL (ref 80.0–100.0)
Platelets: 187 10*3/uL (ref 150–400)
RBC: 2.43 MIL/uL — ABNORMAL LOW (ref 4.22–5.81)
RDW: 12.6 % (ref 11.5–15.5)
WBC: 8.1 10*3/uL (ref 4.0–10.5)
nRBC: 0 % (ref 0.0–0.2)

## 2022-09-02 LAB — BASIC METABOLIC PANEL
Anion gap: 7 (ref 5–15)
BUN: 20 mg/dL (ref 8–23)
CO2: 26 mmol/L (ref 22–32)
Calcium: 8.3 mg/dL — ABNORMAL LOW (ref 8.9–10.3)
Chloride: 101 mmol/L (ref 98–111)
Creatinine, Ser: 1.12 mg/dL (ref 0.61–1.24)
GFR, Estimated: >60 mL/min (ref >60)
Glucose, Bld: 119 mg/dL — ABNORMAL HIGH (ref 70–99)
Potassium: 4.1 mmol/L (ref 3.5–5.1)
Sodium: 134 mmol/L — ABNORMAL LOW (ref 135–145)

## 2022-09-02 LAB — ACID FAST SMEAR (AFB, MYCOBACTERIA): Acid Fast Smear: NEGATIVE

## 2022-09-02 MED ORDER — ORAL CARE MOUTH RINSE: 15.0000 mL | OROMUCOSAL | Status: DC | PRN

## 2022-09-02 NOTE — Progress Notes (Addendum)
      [address]       [place] [address]             [phone]      3 Days Post-Op Procedure(s) (LRB): XI ROBOTIC ASSISTED THORACOSCOPY-RIGHT UPPER LOBE WEDGE RESECTION, (Right) INTERCOSTAL NERVE BLOCK LYMPH NODE DISSECTION Subjective: Patient states he is feeling okay today. Felt a little light headed this morning when sitting up.   Objective: Vital signs in last 24 hours: Temp:  [98 F (36.7 C)-99.4 F (37.4 C)] 98.3 F (36.8 C) (03/10 0722) Pulse Rate:  [64-80] 75 (03/10 0738) Cardiac Rhythm: Normal sinus rhythm (03/10 0738) Resp:  [15-19] 15 (03/10 0738) BP: (97-113)/(55-64) 113/56 (03/10 0722) SpO2:  [92 %-94 %] 92 % (03/10 0738)  Hemodynamic parameters for last 24 hours:    Intake/Output from previous day: 03/09 0701 - 03/10 0700 In: 582.8 [I.V.:582.8] Out: 670 [Urine:400; Chest Tube:270] Intake/Output this shift: Total I/O In: 226.3 [P.O.:180; I.V.:46.3] Out: 240 [Urine:200; Chest Tube:40]  General appearance: alert, cooperative, and no distress Neurologic: intact Heart: regular rate and rhythm, S1, S2 normal, no murmur, click, rub or gallop Lungs: Diminished right sided breath sounds Abdomen: soft, non-tender; bowel sounds normal; no masses,  no organomegaly Extremities: extremities normal, atraumatic, no cyanosis or edema Wound: Clean and dry dressing in place over both chest tubes.  Lab Results: Recent Labs    09/01/22 0016 09/02/22 0011  WBC 7.5 8.1  HGB 7.8* 7.7*  HCT 24.4* 23.2*  PLT 174 187   BMET:  Recent Labs    09/01/22 0016 09/02/22 0011  NA 135 134*  K 4.1 4.1  CL 107 101  CO2 24 26  GLUCOSE 114* 119*  BUN 25* 20  CREATININE 1.42* 1.12  CALCIUM 8.2* 8.3*    PT/INR: No results for input(s): "LABPROT", "INR" in the last 72 hours. ABG    Component Value Date/Time   PHART 7.44 08/29/2022 1405   HCO3 27.2 08/29/2022 1405   O2SAT 98.1 08/29/2022 1405   CBG (last 3)  Recent Labs    08/31/22 0814   GLUCAP 156*    Assessment/Plan: S/P Procedure(s) (LRB): XI ROBOTIC ASSISTED THORACOSCOPY-RIGHT UPPER LOBE WEDGE RESECTION, (Right) INTERCOSTAL NERVE BLOCK LYMPH NODE DISSECTION  Neuro: Patient's pain is slightly improved today. Continue scheduled Tylenol, Gabapentin, Oxycodone PRN and Morphine PRN.    CV: HR 70s, NSR. SBP 113 this AM. Continue to hold ARB   Pulm: Blake drain and pigtail drain in place. CTs with 270cc/24hrs. CT to water seal. No air leak. CXR shows small loculated right apical pleural fluid, looks improved. Atelectasis and consolidation throughout right mid lung. CXR without pneumothorax, pending final read. No air leak on exam. Saturating 93% on RA. Encourage IS and flutter valve.   GI: Tolerating a diet. -BM.   Renal: AKI resolved, Cr 1.12 from 1.42. UO 2110cc/24 hours.    ID: Leukocytosis resolved WBC 8.1, Tmax 99.4.   Expected postop ABLA: Stable H/H 7.7/23.2. Nearing transfusion threshold, on iron supplement. Will monitor closely.    DVT prophylaxis: Lovenox discontinued, continue SCDs.   Dispo: D/C CT today vs tomorrow.     LOS: 3 days    Magdalene River, PA-C 09/02/2022  Patient seen and examined, agree with above Feels better CXR stable, no air leak- will leave CT one  more day Ambulate  Remo Lipps C. Roxan Hockey, MD [facility] [phone]

## 2022-09-03 ENCOUNTER — Inpatient Hospital Stay (HOSPITAL_COMMUNITY): Payer: Medicare Other

## 2022-09-03 LAB — BASIC METABOLIC PANEL
Anion gap: 4 — ABNORMAL LOW (ref 5–15)
BUN: 22 mg/dL (ref 8–23)
CO2: 29 mmol/L (ref 22–32)
Calcium: 8.5 mg/dL — ABNORMAL LOW (ref 8.9–10.3)
Chloride: 101 mmol/L (ref 98–111)
Creatinine, Ser: 1.17 mg/dL (ref 0.61–1.24)
GFR, Estimated: >60 mL/min (ref >60)
Glucose, Bld: 108 mg/dL — ABNORMAL HIGH (ref 70–99)
Potassium: 4.5 mmol/L (ref 3.5–5.1)
Sodium: 134 mmol/L — ABNORMAL LOW (ref 135–145)

## 2022-09-03 LAB — CBC
HCT: 24.1 % — ABNORMAL LOW (ref 39.0–52.0)
Hemoglobin: 7.9 g/dL — ABNORMAL LOW (ref 13.0–17.0)
MCH: 31.1 pg (ref 26.0–34.0)
MCHC: 32.8 g/dL (ref 30.0–36.0)
MCV: 94.9 fL (ref 80.0–100.0)
Platelets: 202 10*3/uL (ref 150–400)
RBC: 2.54 MIL/uL — ABNORMAL LOW (ref 4.22–5.81)
RDW: 12.4 % (ref 11.5–15.5)
WBC: 7.7 10*3/uL (ref 4.0–10.5)
nRBC: 0 % (ref 0.0–0.2)

## 2022-09-03 MED ORDER — BISACODYL 5 MG PO TBEC
5.0000 mg | DELAYED_RELEASE_TABLET | Freq: Every day | ORAL | Status: DC
  Administered 2022-09-03: 5 mg via ORAL
  Administered 2022-09-04: 10 mg via ORAL
  Filled 2022-09-03: qty 2
  Filled 2022-09-03: qty 1
  Filled 2022-09-03: qty 2

## 2022-09-03 MED ORDER — GUAIFENESIN ER 600 MG PO TB12
600.0000 mg | ORAL_TABLET | Freq: Two times a day (BID) | ORAL | Status: DC
  Administered 2022-09-03 – 2022-09-04 (×3): 600 mg via ORAL
  Filled 2022-09-03 (×5): qty 1

## 2022-09-03 NOTE — Care Management Important Message (Signed)
Important Message  Patient Details  Name: David Buck MRN: [id number] Date of Birth: May 31, 1941   Medicare Important Message Given:  Yes     Francois Elk Montine Circle 09/03/2022, 3:15 PM

## 2022-09-03 NOTE — Evaluation (Signed)
Occupational Therapy Evaluation Patient Details Name: David Buck MRN: [id number] DOB: July 12, 1940 Today's Date: 09/03/2022   History of Present Illness 82 y.o. male presents to [facility] hospital on 08/30/2022 for robotic assisted thoracoscopy and RUL wedge resection. PMH includes COPD, MAC, HTN, hypothyroidism, PNA, vertigo, syncope, OSA.   Clinical Impression   Pt reports independence at baseline with ADLs and functional mobility, lives in [place] with spouse. Pt currently needing supervision-mod A for ADLs, supervision for bed mobility, and min guard A for transfers with RW. Pt limited by orthostatic BP (see below) and SpO2 dropping in to high 80's on 1-2L O2 during session.  Pt presenting with impairments listed below, will follow acutely. Recommend HHOT at d/c.  BP seated upon arrival 91/63 (73) BP standing 79/46 (55) BP standing x3 mins 83/51 (61) BP post BSC transfer 109/63 (77)     Recommendations for follow up therapy are one component of a multi-disciplinary discharge planning process, led by the attending physician.  Recommendations may be updated based on patient status, additional functional criteria and insurance authorization.   Follow Up Recommendations  Home health OT     Assistance Recommended at Discharge Intermittent Supervision/Assistance  Patient can return home with the following A little help with walking and/or transfers;A lot of help with bathing/dressing/bathroom;Assistance with cooking/housework;Direct supervision/assist for medications management;Direct supervision/assist for financial management;Assist for transportation;Help with stairs or ramp for entrance    Functional Status Assessment  Patient has had a recent decline in their functional status and demonstrates the ability to make significant improvements in function in a reasonable and predictable amount of time.  Equipment Recommendations  BSC/3in1    Recommendations for Other Services PT consult      Precautions / Restrictions Precautions Precautions: Fall Precaution Comments: orthostatic; watch O2 Restrictions Weight Bearing Restrictions: No      Mobility Bed Mobility Overal bed mobility: Needs Assistance         Sit to supine: Supervision   General bed mobility comments: in chair upon arrival    Transfers Overall transfer level: Needs assistance Equipment used: Rolling walker (2 wheels) Transfers: Sit to/from Stand Sit to Stand: Min guard                  Balance Overall balance assessment: Needs assistance Sitting-balance support: No upper extremity supported, Feet supported Sitting balance-Leahy Scale: Good     Standing balance support: Single extremity supported, Reliant on assistive device for balance, Bilateral upper extremity supported Standing balance-Leahy Scale: Poor                             ADL either performed or assessed with clinical judgement   ADL Overall ADL's : Needs assistance/impaired Eating/Feeding: Set up   Grooming: Sitting;Standing;Min guard   Upper Body Bathing: Minimal assistance   Lower Body Bathing: Moderate assistance   Upper Body Dressing : Minimal assistance Upper Body Dressing Details (indicate cue type and reason): donning gown, assist to manage around lines Lower Body Dressing: Moderate assistance   Toilet Transfer: Min guard;Ambulation;Regular Toilet;Rolling walker (2 wheels)   Toileting- Clothing Manipulation and Hygiene: Supervision/safety Toileting - Clothing Manipulation Details (indicate cue type and reason): pericare in sitting     Functional mobility during ADLs: Min guard;Rolling walker (2 wheels)       Vision Baseline Vision/History: 1 Wears glasses Vision Assessment?: No apparent visual deficits     Perception Perception Perception Tested?: No   Praxis Praxis Praxis tested?:  Not tested    Pertinent Vitals/Pain Pain Assessment Pain Assessment: No/denies pain     Hand  Dominance     Extremity/Trunk Assessment Upper Extremity Assessment Upper Extremity Assessment: Overall WFL for tasks assessed   Lower Extremity Assessment Lower Extremity Assessment: Defer to PT evaluation   Cervical / Trunk Assessment Cervical / Trunk Assessment: Normal   Communication Communication Communication: No difficulties   Cognition Arousal/Alertness: Awake/alert Behavior During Therapy: WFL for tasks assessed/performed Overall Cognitive Status: Within Functional Limits for tasks assessed                                       General Comments  + orthostatic (see note), daughter present and supportive during session    Exercises     Shoulder Instructions      Home Living Family/patient expects to be discharged to:: Other (Comment) ([place]) Living Arrangements: Spouse/significant other Available Help at Discharge: Family;Available 24 hours/day Type of Home: House Home Access: Level entry     Home Layout: One level     Bathroom Shower/Tub: Occupational psychologist: Handicapped height     Home Equipment: Ray - single point;Shower seat   Additional Comments: no O2 at baseline      Prior Functioning/Environment Prior Level of Function : Independent/Modified Independent             Mobility Comments: ambulatory without DME ADLs Comments: spouse drives due to vision deficits, otherwise independent        OT Problem List: Decreased strength;Decreased range of motion;Impaired balance (sitting and/or standing);Decreased activity tolerance;Cardiopulmonary status limiting activity      OT Treatment/Interventions: Self-care/ADL training;Therapeutic exercise;Energy conservation;DME and/or AE instruction;Therapeutic activities;Patient/family education;Balance training    OT Goals(Current goals can be found in the care plan section) Acute Rehab OT Goals Patient Stated Goal: none stated OT Goal Formulation: With  patient Time For Goal Achievement: 09/17/22 Potential to Achieve Goals: Good ADL Goals Pt Will Perform Grooming: with modified independence;standing Pt Will Perform Upper Body Dressing: with modified independence;sitting;standing Pt Will Perform Lower Body Dressing: with modified independence;sit to/from stand;sitting/lateral leans Pt Will Transfer to Toilet: with modified independence;ambulating;regular height toilet Pt Will Perform Tub/Shower Transfer: Shower transfer;with modified independence;ambulating;shower seat;rolling walker  OT Frequency: Min 2X/week    Co-evaluation              AM-PAC OT "6 Clicks" Daily Activity     Outcome Measure Help from another person eating meals?: None Help from another person taking care of personal grooming?: A Little Help from another person toileting, which includes using toliet, bedpan, or urinal?: A Little Help from another person bathing (including washing, rinsing, drying)?: A Lot Help from another person to put on and taking off regular upper body clothing?: A Little Help from another person to put on and taking off regular lower body clothing?: A Lot 6 Click Score: 17   End of Session Equipment Utilized During Treatment: Gait belt;Rolling walker (2 wheels);Oxygen (2L) Nurse Communication: Mobility status  Activity Tolerance: Patient tolerated treatment well Patient left: in bed;with call bell/phone within reach;with bed alarm set;with family/visitor present  OT Visit Diagnosis: Unsteadiness on feet (R26.81);Other abnormalities of gait and mobility (R26.89);Muscle weakness (generalized) (M62.81)                Time: [id number] OT Time Calculation (min): 34 min Charges:  OT General Charges $OT Visit: 1 Visit  OT Evaluation $OT Eval Moderate Complexity: 1 Mod OT Treatments $Self Care/Home Management : 8-22 mins  Renaye Rakers, OTD, OTR/L SecureChat Preferred Acute Rehab [phone]  Ulla Gallo 09/03/2022, 4:24 PM

## 2022-09-03 NOTE — TOC Initial Note (Addendum)
Transition of Care [facility]) - Initial/Assessment Note    Patient Details  Name: David Buck MRN: [id number] Date of Birth: Sep 01, 1940  Transition of Care [facility]) CM/SW Contact:    Cyndi Bender, RN Phone Number: 09/03/2022, 2:12 PM  Clinical Narrative:                 Spoke to patient and daughter at bedside regarding transition needs.  Patient lives at [place] [facility] on [place] with wife. Patient is agreeable to use in house provider [employer] for DME needs.  Notified Kristin with [employer] of walker order.  Patient requesting this RNCM to fax home health PT order to [facility].  Faxed home health PT orders, facesheet and H&P to [facility] ID:[id number]. Patient's spouse will transport home once stable.Marland Kitchen  TOC will continue to follow for needs.  Expected Discharge Plan: [place] Barriers to Discharge: Continued Medical Work up   Patient Goals and CMS Choice Patient states their goals for this hospitalization and ongoing recovery are:: return to assissted living at [place] [facility] [employer].gov Compare Post Acute Care list provided to:: Patient Choice offered to / list presented to : Patient      Expected Discharge Plan and Services   Discharge Planning Services: CM Consult Post Acute Care Choice: [place] arrangements for the past 2 months: [place]                 DME Arranged: Walker rolling DME Agency: [employer] Date DME Agency Contacted: 09/03/22 Time DME Agency Contacted: [phone] Representative spoke with at DME Agency: [place]: PT [place] Agency: Other - See comment Ellender Hose)        Prior Living Arrangements/Services Living arrangements for the past 2 months: [place] Lives with:: Spouse Patient language and need for interpreter reviewed:: Yes Do you feel safe going back to the place where you live?: Yes      Need for Family Participation in Patient Care: Yes (Comment) Care giver support  system in place?: Yes (comment)   Criminal Activity/Legal Involvement Pertinent to Current Situation/Hospitalization: No - Comment as needed  Activities of Daily Living      Permission Sought/Granted Permission sought to share information with : Investment banker, corporate granted to share info w AGENCY: [facility]        Emotional Assessment   Attitude/Demeanor/Rapport: Gracious Affect (typically observed): Accepting Orientation: : Oriented to Self, Oriented to Place, Oriented to  Time Alcohol / Substance Use: Not Applicable Psych Involvement: No (comment)  Admission diagnosis:  Right upper lobe pulmonary nodule [R91.1] Patient Active Problem List   Diagnosis Date Noted   Right upper lobe pulmonary nodule 08/30/2022   Coin lesion 05/28/2022   Nausea 03/15/2022   GERD (gastroesophageal reflux disease) 03/15/2022   OSA (obstructive sleep apnea) 01/25/2022   Arthralgia 10/15/2021   Balance disorder 10/15/2021   Depressed mood 10/15/2021   Allergic rhinitis 10/12/2021   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   History of colonic polyps 10/12/2021   Nonspecific abnormal findings on radiological and examination of lung field 10/12/2021   Sensorineural hearing loss, 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   Unspecified protein-calorie malnutrition ([place]) 10/12/2021   Chest wall pain 10/12/2021   Status post eye surgery 08/10/2021   Choroidal neovascular membrane, 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   Groin pain, left 07/11/2021   Tremor 12/19/2020   Eustachian tube dysfunction, right 06/20/2020   Hemoptysis 04/08/2020   Depression 03/22/2020   Gait  disorder 02/09/2020   Diplopia 10/21/2019   Hypotropia of right eye 08/25/2019   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   Vision changes 04/28/2019   Osteoarthritis of carpometacarpal (CMC) joint of thumb 03/11/2019   Coronary atherosclerosis 08/26/2018   Aortic atherosclerosis ([place]) 08/10/2018   Presbycusis of both ears 02/11/2018   Tinnitus 01/21/2018    Syncope 12/02/2017   Bradycardia    Preop exam for internal medicine 12/13/2015   Microhematuria 09/12/2015   Fatigue 08/01/2015   UTI (urinary tract infection) 08/01/2015   Acute upper respiratory infection 08/01/2015   CAP (community acquired pneumonia) 08/01/2015   Libido, decreased 08/01/2015   TIA (transient ischemic attack) 12/03/2014   Vertigo 10/20/2014   Hearing loss d/t noise 10/20/2014   Elevated PSA 11/30/2013   Osteoarthritis of CMC joint of thumb 11/13/2013   Thumb pain 11/12/2013   Essential hypertension 10/17/2012   Elevated blood pressure 11/05/2011   Well adult exam 11/03/2010   ACTINIC KERATOSIS 05/05/2010   Pulmonary nodules 01/30/2010   Essential tremor 02/07/2009   Seasonal and perennial allergic rhinitis 07/13/2008   ABNORMAL GLUCOSE NEC 07/13/2008   SINUSITIS, ACUTE 02/03/2008   TOBACCO USE DISORDER/SMOKER-SMOKING CESSATION DISCUSSED 10/02/2007   WEIGHT LOSS, ABNORMAL 10/02/2007   COPD mixed type ([place]) 08/04/2007   ACQUIRED CYST OF KIDNEY 08/04/2007   Hypothyroidism 07/24/2007   RBBB 07/23/2007   RAYNAUD'S SYNDROME 07/23/2007   OSTEOARTHRITIS 07/23/2007   WHEEZING 07/23/2007   Abnormal chest CT 07/23/2007   Abnormality of lung on CXR 07/23/2007   Raynaud's phenomenon 07/23/2007   PCP:  Cassandria Anger, MD Pharmacy:   [facility] [id number] - [place] [place] [address] Phone: [phone] Fax: [phone]     Social Determinants of Health (SDOH) Social History: SDOH Screenings   Food Insecurity: No Food Insecurity (06/28/2022)  Housing: Low Risk  (06/28/2022)  Transportation Needs: No Transportation Needs (06/28/2022)  Utilities: Not At Risk (06/28/2022)  Alcohol Screen: Low Risk  (06/28/2022)  Depression (PHQ2-9): Low Risk  (06/28/2022)  Financial Resource Strain: Low Risk  (06/28/2022)  Physical Activity: Sufficiently Active (06/28/2022)  Social Connections: Moderately  Isolated (06/28/2022)  Stress: No Stress Concern Present (06/28/2022)  Tobacco Use: Medium Risk (08/31/2022)   SDOH Interventions:     Readmission Risk Interventions     No data to display

## 2022-09-03 NOTE — Progress Notes (Signed)
      [address]       [place] [address]             [phone]      4 Days Post-Op Procedure(s) (LRB): XI ROBOTIC ASSISTED THORACOSCOPY-RIGHT UPPER LOBE WEDGE RESECTION, (Right) INTERCOSTAL NERVE BLOCK LYMPH NODE DISSECTION  Subjective:  Sitting up in bed.  No complaints.  Hoping to go home tomorrow.  Objective: Vital signs in last 24 hours: Temp:  [97.6 F (36.4 C)-98.5 F (36.9 C)] 97.6 F (36.4 C) (03/11 0742) Pulse Rate:  [65-83] 78 (03/11 0742) Cardiac Rhythm: Normal sinus rhythm (03/10 1927) Resp:  [13-21] 16 (03/11 0742) BP: (92-135)/(50-83) 112/64 (03/11 0742) SpO2:  [92 %-97 %] 93 % (03/11 0742)  Intake/Output from previous day: 03/10 0701 - 03/11 0700 In: 737.5 [P.O.:660; I.V.:77.5] Out: 1335 [Urine:1055; Chest Tube:280] Intake/Output this shift: Total I/O In: 120 [P.O.:120] Out: -   General appearance: alert, cooperative, and no distress Heart: regular rate and rhythm Lungs: clear to auscultation bilaterally Abdomen: soft, non-tender; bowel sounds normal; no masses,  no organomegaly Extremities: extremities normal, atraumatic, no cyanosis or edema Wound: clean and dry, ecchymosis at some port sites  Lab Results: Recent Labs    09/02/22 0011 09/03/22 0009  WBC 8.1 7.7  HGB 7.7* 7.9*  HCT 23.2* 24.1*  PLT 187 202   BMET:  Recent Labs    09/02/22 0011 09/03/22 0009  NA 134* 134*  K 4.1 4.5  CL 101 101  CO2 26 29  GLUCOSE 119* 108*  BUN 20 22  CREATININE 1.12 1.17  CALCIUM 8.3* 8.5*    PT/INR: No results for input(s): "LABPROT", "INR" in the last 72 hours. ABG    Component Value Date/Time   PHART 7.44 08/29/2022 1405   HCO3 27.2 08/29/2022 1405   O2SAT 98.1 08/29/2022 1405   CBG (last 3)  Recent Labs    08/31/22 0814  GLUCAP 156*    Assessment/Plan: S/P Procedure(s) (LRB): XI ROBOTIC ASSISTED THORACOSCOPY-RIGHT UPPER LOBE WEDGE RESECTION, (Right) INTERCOSTAL NERVE BLOCK LYMPH NODE DISSECTION  CV-  NSR, BP stable Pulm- no acute issues, CXR w/o pneumothorax, no air leak... d/c chest tubes today Renal-creatinine is stable and improved Expected post operative blood loss anemia, mild stable at 7.9 Pain- controlled Dispo- patient stable, d/c chest tubes today if CXR remains stable for d/c in AM   LOS: 4 days    Ellwood Handler, PA-C 09/03/2022

## 2022-09-03 NOTE — Evaluation (Signed)
Physical Therapy Evaluation Patient Details Name: David Buck MRN: [id number] DOB: 10/20/1940 Today's Date: 09/03/2022  History of Present Illness  82 y.o. male presents to [facility] hospital on 08/30/2022 for robotic assisted thoracoscopy and RUL wedge resection. PMH includes COPD, MAC, HTN, hypothyroidism, PNA, vertigo, syncope, OSA.  Clinical Impression  Pt presents to PT with deficits in strength, power, gait, functional mobility, endurance. Pt is limited by symptomatic hypotension this session, with BP dropping with initial attempts at standing and remaining below [id number] systolic with symptoms. PT anticipates the pt will mobilize well if BP is more stable. PT will follow up tomorrow for further mobility assessment. PT recommends discharge home with HHPT and assistance from spouse.       Recommendations for follow up therapy are one component of a multi-disciplinary discharge planning process, led by the attending physician.  Recommendations may be updated based on patient status, additional functional criteria and insurance authorization.  Follow Up Recommendations Home health PT      Assistance Recommended at Discharge Intermittent Supervision/Assistance  Patient can return home with the following  A little help with walking and/or transfers;A little help with bathing/dressing/bathroom;Assistance with cooking/housework;Assist for transportation;Help with stairs or ramp for entrance    Equipment Recommendations Rolling walker (2 wheels)  Recommendations for Other Services       Functional Status Assessment Patient has had a recent decline in their functional status and demonstrates the ability to make significant improvements in function in a reasonable and predictable amount of time.     Precautions / Restrictions Precautions Precautions: Fall Precaution Comments: orthostatic Restrictions Weight Bearing Restrictions: No      Mobility  Bed Mobility Overal bed mobility: Needs  Assistance Bed Mobility: Supine to Sit, Sit to Supine     Supine to sit: Supervision, HOB elevated Sit to supine: Supervision, HOB elevated        Transfers Overall transfer level: Needs assistance Equipment used: Rolling walker (2 wheels) Transfers: Sit to/from Stand Sit to Stand: Min guard           General transfer comment: increased time, verbal cues for hand placement    Ambulation/Gait Ambulation/Gait assistance: Min assist Gait Distance (Feet): 2 Feet Assistive device: Rolling walker (2 wheels) Gait Pattern/deviations: Step-to pattern     Pre-gait activities: marching for ~6 steps at edge of bed General Gait Details: side steps at edge of bed, deferred ambulation away from bed due to symptomatic orthostatic hypotension  Stairs            Wheelchair Mobility    Modified Rankin (Stroke Patients Only)       Balance Overall balance assessment: Needs assistance Sitting-balance support: No upper extremity supported, Feet supported Sitting balance-Leahy Scale: Good     Standing balance support: Single extremity supported, Reliant on assistive device for balance, Bilateral upper extremity supported Standing balance-Leahy Scale: Poor Standing balance comment: increased anterior-posterior postural sway                             Pertinent Vitals/Pain Pain Assessment Pain Assessment: No/denies pain    Home Living Family/patient expects to be discharged to:: Other (Comment) (Independent Living at [facility]) Living Arrangements: Spouse/significant other Available Help at Discharge: Family;Available 24 hours/day Type of Home: House Home Access: Level entry       Home Layout: One level Home Equipment: Cane - single point      Prior Function Prior Level of Function : Independent/Modified  Independent             Mobility Comments: ambulatory without DME ADLs Comments: spouse drives due to vision deficits, otherwise  independent     Hand Dominance        Extremity/Trunk Assessment   Upper Extremity Assessment Upper Extremity Assessment: Overall WFL for tasks assessed    Lower Extremity Assessment Lower Extremity Assessment: Generalized weakness    Cervical / Trunk Assessment Cervical / Trunk Assessment: Normal  Communication   Communication: No difficulties  Cognition Arousal/Alertness: Awake/alert Behavior During Therapy: WFL for tasks assessed/performed Overall Cognitive Status: Within Functional Limits for tasks assessed                                          General Comments General comments (skin integrity, edema, etc.): pt with orthostatic hypotension, BP in [id number] systolic pre-mobility, falls to 70s in standing initially. Pt also with bloody drainage from site of chest tube removal, pt's daughter and RN assist in re-dressing. Pt has persistent orthostatic symptoms in standing with systolic BP remaining under 100 with multiple other transfer attempts. PT defers ambulation from bedside due to these symptoms.    Exercises     Assessment/Plan    PT Assessment Patient needs continued PT services  PT Problem List Decreased strength;Decreased activity tolerance;Decreased balance;Decreased mobility;Decreased knowledge of use of DME;Cardiopulmonary status limiting activity       PT Treatment Interventions DME instruction;Gait training;Functional mobility training;Therapeutic activities;Therapeutic exercise;Balance training;Patient/family education;Neuromuscular re-education    PT Goals (Current goals can be found in the Care Plan section)  Acute Rehab PT Goals Patient Stated Goal: to return to independence PT Goal Formulation: With patient Time For Goal Achievement: 09/17/22 Potential to Achieve Goals: Good    Frequency Min 3X/week     Co-evaluation               AM-PAC PT "6 Clicks" Mobility  Outcome Measure Help needed turning from your back to your  side while in a flat bed without using bedrails?: A Little Help needed moving from lying on your back to sitting on the side of a flat bed without using bedrails?: A Little Help needed moving to and from a bed to a chair (including a wheelchair)?: A Little Help needed standing up from a chair using your arms (e.g., wheelchair or bedside chair)?: A Little Help needed to walk in hospital room?: A Lot Help needed climbing 3-5 steps with a railing? : Total 6 Click Score: 15    End of Session   Activity Tolerance: Treatment limited secondary to medical complications (Comment) (orthostatic hypotension) Patient left: in bed;with call bell/phone within reach;with family/visitor present Nurse Communication: Mobility status PT Visit Diagnosis: Other abnormalities of gait and mobility (R26.89);Muscle weakness (generalized) (M62.81)    Time: [id number] PT Time Calculation (min) (ACUTE ONLY): 30 min   Charges:   PT Evaluation $PT Eval Low Complexity: [place], PT, DPT Acute Rehabilitation Office [phone]   Zenaida Niece 09/03/2022, 12:20 PM

## 2022-09-04 ENCOUNTER — Inpatient Hospital Stay (HOSPITAL_COMMUNITY): Payer: Medicare Other

## 2022-09-04 LAB — CBC
HCT: 23.8 % — ABNORMAL LOW (ref 39.0–52.0)
Hemoglobin: 7.9 g/dL — ABNORMAL LOW (ref 13.0–17.0)
MCH: 31.2 pg (ref 26.0–34.0)
MCHC: 33.2 g/dL (ref 30.0–36.0)
MCV: 94.1 fL (ref 80.0–100.0)
Platelets: 230 10*3/uL (ref 150–400)
RBC: 2.53 MIL/uL — ABNORMAL LOW (ref 4.22–5.81)
RDW: 12.3 % (ref 11.5–15.5)
WBC: 8.3 10*3/uL (ref 4.0–10.5)
nRBC: 0 % (ref 0.0–0.2)

## 2022-09-04 LAB — BASIC METABOLIC PANEL
Anion gap: 8 (ref 5–15)
BUN: 23 mg/dL (ref 8–23)
CO2: 26 mmol/L (ref 22–32)
Calcium: 8.7 mg/dL — ABNORMAL LOW (ref 8.9–10.3)
Chloride: 101 mmol/L (ref 98–111)
Creatinine, Ser: 1.04 mg/dL (ref 0.61–1.24)
GFR, Estimated: >60 mL/min (ref >60)
Glucose, Bld: 111 mg/dL — ABNORMAL HIGH (ref 70–99)
Potassium: 4.2 mmol/L (ref 3.5–5.1)
Sodium: 135 mmol/L (ref 135–145)

## 2022-09-04 MED ORDER — AZITHROMYCIN 500 MG PO TABS
500.0000 mg | ORAL_TABLET | Freq: Every day | ORAL | Status: DC
  Administered 2022-09-04 – 2022-09-05 (×2): 500 mg via ORAL
  Filled 2022-09-04 (×2): qty 1

## 2022-09-04 MED ORDER — DOCUSATE SODIUM 100 MG PO CAPS: 100.0000 mg | ORAL_CAPSULE | Freq: Two times a day (BID) | ORAL | Status: DC | PRN

## 2022-09-04 MED ORDER — MAGNESIUM HYDROXIDE 400 MG/5ML PO SUSP
30.0000 mL | Freq: Every day | ORAL | Status: DC | PRN
  Administered 2022-09-04: 30 mL via ORAL
  Filled 2022-09-04: qty 30

## 2022-09-04 NOTE — Progress Notes (Addendum)
Physical Therapy Treatment Patient Details Name: David Buck MRN: [id number] DOB: [birth year] Today's Date: 09/04/2022   History of Present Illness 82 y.o. male presents to [facility] hospital on 08/30/2022 for robotic assisted thoracoscopy and RUL wedge resection. PMH includes COPD, MAC, HTN, hypothyroidism, PNA, vertigo, syncope, OSA.    PT Comments    Pt remains with orthostatic drop with transitional movements and mild lightheadedness with standing maintaining MAP >60. Pt able to walk with reliance on RW and maintaining SPO2 on RA. Pt reports generalized fatigue but eager to walk and progress function for return home. Pt mainly walks in home with limited walking in community with dog. Pt educated for increased time with transitional movements, seated HEP prior to standing and monitoring symptoms and BP with activity at this time. Will continue to follow.    Supine 102/74 (83) Standing 72/62 (67) Sitting after gait 88/70 (77) In chair legs elevated end of session 122/73 (89) HR 111-122 SPO2 90-95% on RA   Recommendations for follow up therapy are one component of a multi-disciplinary discharge planning process, led by the attending physician.  Recommendations may be updated based on patient status, additional functional criteria and insurance authorization.  Follow Up Recommendations  Home health PT     Assistance Recommended at Discharge Intermittent Supervision/Assistance  Patient can return home with the following A little help with walking and/or transfers;A little help with bathing/dressing/bathroom;Assistance with cooking/housework;Assist for transportation;Help with stairs or ramp for entrance   Equipment Recommendations  Rolling walker (2 wheels)    Recommendations for Other Services       Precautions / Restrictions Precautions Precautions: Fall Precaution Comments: orthostatic     Mobility  Bed Mobility Overal bed mobility: Modified Independent              General bed mobility comments: bed flat    Transfers Overall transfer level: Needs assistance   Transfers: Sit to/from Stand Sit to Stand: Supervision           General transfer comment: supervision with cues for hand placement and safety and to monitor BP    Ambulation/Gait Ambulation/Gait assistance: Min guard Gait Distance (Feet): 300 Feet Assistive device: Rolling walker (2 wheels) Gait Pattern/deviations: Step-through pattern, Decreased stride length, Trunk flexed   Gait velocity interpretation: 1.31 - 2.62 ft/sec, indicative of limited community ambulator   General Gait Details: cues for posture, proximity to RW and directional cues   Stairs             Wheelchair Mobility    Modified Rankin (Stroke Patients Only)       Balance Overall balance assessment: Needs assistance Sitting-balance support: No upper extremity supported, Feet supported Sitting balance-Leahy Scale: Good     Standing balance support: Bilateral upper extremity supported, Reliant on assistive device for balance Standing balance-Leahy Scale: Poor Standing balance comment: RW for gait                            Cognition Arousal/Alertness: Awake/alert Behavior During Therapy: WFL for tasks assessed/performed Overall Cognitive Status: Within Functional Limits for tasks assessed                                          Exercises      General Comments        Pertinent Vitals/Pain Pain Assessment Pain Assessment: No/denies pain  Home Living                          Prior Function            PT Goals (current goals can now be found in the care plan section) Progress towards PT goals: Progressing toward goals    Frequency    Min 3X/week      PT Plan Current plan remains appropriate    Co-evaluation              AM-PAC PT "6 Clicks" Mobility   Outcome Measure  Help needed turning from your back to your side  while in a flat bed without using bedrails?: A Little Help needed moving from lying on your back to sitting on the side of a flat bed without using bedrails?: A Little Help needed moving to and from a bed to a chair (including a wheelchair)?: A Little Help needed standing up from a chair using your arms (e.g., wheelchair or bedside chair)?: A Little Help needed to walk in hospital room?: A Little Help needed climbing 3-5 steps with a railing? : A Lot 6 Click Score: 17    End of Session Equipment Utilized During Treatment: Gait belt Activity Tolerance: Patient tolerated treatment well Patient left: in chair;with call bell/phone within reach;with chair alarm set Nurse Communication: Mobility status PT Visit Diagnosis: Other abnormalities of gait and mobility (R26.89);Muscle weakness (generalized) (M62.81)     Time: [id number] PT Time Calculation (min) (ACUTE ONLY): 24 min  Charges:  $Gait Training: 8-22 mins $Therapeutic Activity: 8-22 mins                     Bayard Males, PT Acute Rehabilitation Services Office: [place] 09/04/2022, 11:48 AM

## 2022-09-04 NOTE — Progress Notes (Signed)
[address]       [place] [address]             [phone]     5 Days Post-Op Procedure(s) (LRB): XI ROBOTIC ASSISTED THORACOSCOPY-RIGHT UPPER LOBE WEDGE RESECTION, (Right) INTERCOSTAL NERVE BLOCK LYMPH NODE DISSECTION Subjective: Some productive sputum, decreasing over time  Objective: Vital signs in last 24 hours: Temp:  [98.2 F (36.8 C)-99.8 F (37.7 C)] 99.8 F (37.7 C) (03/12 0714) Pulse Rate:  [64-106] 72 (03/12 0714) Cardiac Rhythm: Normal sinus rhythm (03/11 2000) Resp:  [14-20] 19 (03/12 0714) BP: (106-135)/(58-99) 135/99 (03/12 0714) SpO2:  [91 %-95 %] 93 % (03/12 0714)  Hemodynamic parameters for last 24 hours:    Intake/Output from previous day: 03/11 0701 - 03/12 0700 In: 120 [P.O.:120] Out: 200 [Urine:200] Intake/Output this shift: No intake/output data recorded.  General appearance: alert, cooperative, and no distress Heart: occas irregular beat  Lungs: mildly dim right base, + s q air Abdomen: benign Extremities: no edema Wound: healing well  Lab Results: Recent Labs    09/03/22 0009 09/04/22 0009  WBC 7.7 8.3  HGB 7.9* 7.9*  HCT 24.1* 23.8*  PLT 202 230   BMET:  Recent Labs    09/03/22 0009 09/04/22 0009  NA 134* 135  K 4.5 4.2  CL 101 101  CO2 29 26  GLUCOSE 108* 111*  BUN 22 23  CREATININE 1.17 1.04  CALCIUM 8.5* 8.7*    PT/INR: No results for input(s): "LABPROT", "INR" in the last 72 hours. ABG    Component Value Date/Time   PHART 7.44 08/29/2022 1405   HCO3 27.2 08/29/2022 1405   O2SAT 98.1 08/29/2022 1405   CBG (last 3)  No results for input(s): "GLUCAP" in the last 72 hours.  Meds Scheduled Meds:  acetaminophen  1,000 mg Oral Q6H   Or   acetaminophen (TYLENOL) oral liquid 160 mg/5 mL  1,000 mg Oral Q6H   bisacodyl  5-10 mg Oral Daily   buPROPion  150 mg Oral Daily   Fe Fum-Vit C-Vit B12-FA  1 capsule Oral Daily   gabapentin  100 mg Oral TID   guaiFENesin  600 mg Oral BID    LORazepam  0.5 mg Oral Daily   pantoprazole  40 mg Oral Daily   senna-docusate  1 tablet Oral QHS   Continuous Infusions:  sodium chloride Stopped (09/02/22 1045)   PRN Meds:.morphine injection, ondansetron (ZOFRAN) IV, mouth rinse, oxyCODONE  Xrays DG Chest 1V REPEAT Same Day  Result Date: 09/03/2022 CLINICAL DATA:  Status post partial lobectomy. EXAM: CHEST - 1 VIEW SAME DAY COMPARISON:  Same day chest radiograph obtained at 6:05 a.m. FINDINGS: Cardiac and mediastinal contours are unchanged. Interval removal right-sided chest tube possible trace right apical pneumothorax. Stable postsurgical appearance of the right hemithorax. Subcutaneous gas of the right chest wall. IMPRESSION: Interval removal right-sided chest tube with possible trace right apical pneumothorax. These results will be called to the ordering clinician or representative by the Radiologist Assistant, and communication documented in the PACS or [employer]. Electronically Signed   By: Yetta Glassman M.D.   On: 09/03/2022 13:39   DG CHEST PORT 1 VIEW  Result Date: 09/03/2022 CLINICAL DATA:  Status post partial lobectomy. EXAM: PORTABLE CHEST 1 VIEW COMPARISON:  09/02/2022 FINDINGS: Right chest tubes remain in place with right apical opacity presumably related to fluid. The right parahilar opacity is similar to prior with some improvement in aeration at the right  base. Diffuse subcutaneous emphysema is noted over the visualized right chest wall and left supraclavicular region. Heart size stable. Bones are diffusely demineralized. Telemetry leads overlie the chest. IMPRESSION: No substantial interval change in exam. Right chest tubes remain in place with right apical opacity presumably related to fluid. Interval improvement in aeration at the right base. Electronically Signed   By: Misty Stanley M.D.   On: 09/03/2022 08:16    Assessment/Plan: S/P Procedure(s) (LRB): XI ROBOTIC ASSISTED THORACOSCOPY-RIGHT UPPER LOBE WEDGE  RESECTION, (Right) INTERCOSTAL NERVE BLOCK LYMPH NODE DISSECTION  1 Tmax 99.8, VSS, sinus with some PAC's/PVC's 2 O2 sats good on 2 liters, starting azithromax for bronchitis, no leukocytosis 3 normal renal fxn 4 expected ABLA, H/H remains pretty stable at 7.9/23.8- on Iron 5 CXR - similar ASD, increased SQ air 6 cont pulm hygiene and rehab      LOS: 5 days    John Giovanni 09/04/2022

## 2022-09-05 ENCOUNTER — Inpatient Hospital Stay (HOSPITAL_COMMUNITY): Payer: Medicare Other

## 2022-09-05 ENCOUNTER — Telehealth: Payer: Self-pay

## 2022-09-05 LAB — CBC
HCT: 23.6 % — ABNORMAL LOW (ref 39.0–52.0)
Hemoglobin: 8.1 g/dL — ABNORMAL LOW (ref 13.0–17.0)
MCH: 31.8 pg (ref 26.0–34.0)
MCHC: 34.3 g/dL (ref 30.0–36.0)
MCV: 92.5 fL (ref 80.0–100.0)
Platelets: 270 10*3/uL (ref 150–400)
RBC: 2.55 MIL/uL — ABNORMAL LOW (ref 4.22–5.81)
RDW: 12.3 % (ref 11.5–15.5)
WBC: 8.2 10*3/uL (ref 4.0–10.5)
nRBC: 0 % (ref 0.0–0.2)

## 2022-09-05 MED ORDER — AZITHROMYCIN 500 MG PO TABS: 500.0000 mg | ORAL_TABLET | Freq: Every day | ORAL | 0 refills | Status: DC

## 2022-09-05 MED ORDER — OXYCODONE HCL 5 MG PO TABS: 5.0000 mg | ORAL_TABLET | Freq: Four times a day (QID) | ORAL | 0 refills | Status: DC | PRN

## 2022-09-05 NOTE — Progress Notes (Addendum)
      [address]       [place] [address]             [phone]      6 Days Post-Op Procedure(s) (LRB): XI ROBOTIC ASSISTED THORACOSCOPY-RIGHT UPPER LOBE WEDGE RESECTION, (Right) INTERCOSTAL NERVE BLOCK LYMPH NODE DISSECTION Subjective: Patient states he is ready to go home, he has no pain except for when he moves a lot and his cough has improved.  Objective: Vital signs in last 24 hours: Temp:  [98 F (36.7 C)-99.1 F (37.3 C)] 98.7 F (37.1 C) (03/13 0732) Pulse Rate:  [74-110] 110 (03/13 0732) Cardiac Rhythm: Normal sinus rhythm (03/12 1910) Resp:  [16-20] 16 (03/13 0732) BP: (112-128)/(59-86) 121/86 (03/13 0732) SpO2:  [90 %-92 %] 90 % (03/13 0732)  Hemodynamic parameters for last 24 hours:    Intake/Output from previous day: 03/12 0701 - 03/13 0700 In: 240 [P.O.:240] Out: 900 [Urine:900] Intake/Output this shift: Total I/O In: -  Out: 200 [Urine:200]  General appearance: alert, cooperative, and no distress Neurologic: intact Heart: regular rate and rhythm, S1, S2 normal, no murmur, click, rub or gallop Lungs: Minimally diminished right sided breath sounds Abdomen: soft, non-tender; bowel sounds normal; no masses,  no organomegaly Extremities: extremities normal, atraumatic, no cyanosis or edema Wound: Extensive ecchymosis surrounding right chest tube site and down right side, incisions are clean and dry without sign of infection  Lab Results: Recent Labs    09/04/22 0009 09/05/22 0006  WBC 8.3 8.2  HGB 7.9* 8.1*  HCT 23.8* 23.6*  PLT 230 270   BMET:  Recent Labs    09/03/22 0009 09/04/22 0009  NA 134* 135  K 4.5 4.2  CL 101 101  CO2 29 26  GLUCOSE 108* 111*  BUN 22 23  CREATININE 1.17 1.04  CALCIUM 8.5* 8.7*    PT/INR: No results for input(s): "LABPROT", "INR" in the last 72 hours. ABG    Component Value Date/Time   PHART 7.44 08/29/2022 1405   HCO3 27.2 08/29/2022 1405   O2SAT 98.1 08/29/2022 1405   CBG (last 3)   No results for input(s): "GLUCAP" in the last 72 hours.  Assessment/Plan: S/P Procedure(s) (LRB): XI ROBOTIC ASSISTED THORACOSCOPY-RIGHT UPPER LOBE WEDGE RESECTION, (Right) INTERCOSTAL NERVE BLOCK LYMPH NODE DISSECTION  Neuro: Pain well controlled.    CV: HR 80s-111, NSR-ST. SBP 121 this AM. Will hold ARB until follow up appt.   Pulm: Saturating 91% on RA. Ambulating well with RW. On Zithromax x 3 doses for bronchitis. No leukocytosis, Tmax 99.1.   GI: Tolerating a diet. +BM.   Renal: AKI resolved. UO 900cc/24 hours.    ID: Leukocytosis resolved, Tmax 99.1.   Expected postop ABLA: Improved H/H 8.1/23.6    DVT prophylaxis: Lovenox discontinued, continue SCDs.   Dispo: Plan D/C to home today with home PT/OT     LOS: 6 days    Magdalene River, PA-C 09/05/2022 Patient seen and examined, agree with above Looks better today Will dc home  [place]. Roxan Hockey, MD [facility] [phone]

## 2022-09-05 NOTE — Telephone Encounter (Signed)
Pts wife has stated pt is needing an FL-2 form on file if he needs assistance with skilled nursing (PT, OT, ETC).  Please call the [facility] @ [place] [phone].  **Please call pts wife if pt has to be seen as he was just released from th hospital and due to the reasons for his hospital stay is now needing an FL-2 in place or Megan the DON Civil engineer, contracting of Nursing) can be called for more information as well.

## 2022-09-05 NOTE — Telephone Encounter (Signed)
Called David Buck had to [facility] RTC.Marland KitchenJohny Chess

## 2022-09-06 ENCOUNTER — Encounter: Payer: Self-pay | Admitting: *Deleted

## 2022-09-06 ENCOUNTER — Telehealth: Payer: Self-pay | Admitting: Internal Medicine

## 2022-09-06 ENCOUNTER — Telehealth: Payer: Self-pay | Admitting: *Deleted

## 2022-09-06 NOTE — Telephone Encounter (Signed)
David Buck from [facility] called - She would like order for physical and occupational therapy faxed to them.  The wife is requesting this.  Fax:  [phone]

## 2022-09-06 NOTE — Telephone Encounter (Signed)
David Buck returned the call and would like a callback at [phone].

## 2022-09-06 NOTE — Transitions of Care (Post Inpatient/ED Visit) (Signed)
09/06/2022  Name: David Buck MRN: [id number] DOB: Aug 12, 1940  Today's TOC FU Call Status: Today's TOC FU Call Status:: Successful TOC FU Call Competed TOC FU Call Complete Date: 09/06/22  Transition Care Management Follow-up Telephone Call Date of Discharge: 09/05/22 Discharge Facility: [facility]) Type of Discharge: Inpatient Admission Primary Inpatient Discharge Diagnosis:: (R) LL pulmonary nodule with How have you been since you were released from the hospital?: Better ("I am doing fine-- just trying to get my energy back, going slowly with activity, I have so many people here helping me at [facility], I don't need any additional help.") Any questions or concerns?: No  Items Reviewed: Did you receive and understand the discharge instructions provided?: Yes (thoroughly reviewed with patient who verbalizes excellent understanding of same) Medications obtained and verified?: Yes (Medications Reviewed) (Full medication review completed; no concerns or discrepancies identified; confirmed patient obtained/ is taking all newly Rx'd medications as instructed; self-manages medications and denies questions/ concerns around medications today) Any new allergies since your discharge?: No Dietary orders reviewed?: Yes Type of Diet Ordered:: [place] Healthy/ low salt Do you have support at home?: Yes People in Home: spouse Name of Support/Comfort Primary Source: Reports he is independent in self-care; Lives in independent living facility at [facility] with spouse-- reports he has "more than enough" assistance at home, if needed  [place] and Equipment/Supplies: [place] Ordered?: Yes Name of [place]:: PT through [facility] set up a time to come to your home?: No ("It is in progress as we speak;" per patient report; reports [facility] team has reached out to him and is aware of need to schedule PT sessions with their private  agency) EMR reviewed for Home Health Orders:  (reviewed and confirmed  that PT is in progress through [facility]) Any new equipment or medical supplies ordered?: Yes (walker) Name of Medical supply agency?: [facility] Were you able to get the equipment/medical supplies?: Yes Do you have any questions related to the use of the equipment/supplies?: No  Functional Questionnaire: Do you need assistance with bathing/showering or dressing?: No Do you need assistance with meal preparation?: No Do you need assistance with eating?: No Do you have difficulty maintaining continence: No Do you need assistance with getting out of bed/getting out of a chair/moving?: No Do you have difficulty managing or taking your medications?: No  Folllow up appointments reviewed: PCP Follow-up appointment confirmed?: NA (verified not indicated per hospital discharging surgical provider notes-- confirmed patient has 2 specialists appoinments scheduled-- one within one week of hospital discharge, another within 2 weeks) [facility] Follow-up appointment confirmed?: Yes Date of Specialist follow-up appointment?: 09/13/22 Follow-Up Specialty Provider:: [facility] surgical provider Do you need transportation to your follow-up appointment?: No Do you understand care options if your condition(s) worsen?: Yes-patient verbalized understanding  SDOH Interventions Today    Flowsheet Row Most Recent Value  SDOH Interventions   Food Insecurity Interventions Intervention Not Indicated  [Lives at [place]- meals are prepared]  Transportation Interventions Intervention Not Indicated  [spouse provides transportation post- recent surgery]      TOC Interventions Today    Flowsheet Row Most Recent Value  TOC Interventions   TOC Interventions Discussed/Reviewed TOC Interventions Discussed, Post op wound/incision care  [Declines need for ongoing/ further care coordination outreach/ follow up]      Interventions Today     Flowsheet Row Most Recent Value  Chronic Disease   Chronic disease during today's  visit Other  [(R) LL pulmonary nodule with surgical wedge resection]  General Interventions   General Interventions Discussed/Reviewed Doctor Visits, Durable Medical Equipment (DME), General Interventions Discussed  Doctor Visits Discussed/Reviewed Doctor Visits Discussed, PCP, Specialist  Durable Medical Equipment (DME) Walker  PCP/Specialist Visits Compliance with follow-up visit  Education Interventions   Education Provided Provided Education  Provided Verbal Education On Other, Medication, When to see the doctor  [Importance of monitoring/ recording blood pressures at home while off of blood pressure medication,  signs/ symptoms low blood pressure with corresponding action plan]  Nutrition Interventions   Nutrition Discussed/Reviewed Nutrition Discussed  Pharmacy Interventions   Pharmacy Dicussed/Reviewed Pharmacy Topics Discussed  [Full medication review with updating medication list in EHR per patient report]  Safety Interventions   Safety Discussed/Reviewed Safety Discussed  [need to continue using walker in setting of recently documented ow blood pressure,  especially if symptomatic]      Oneta Rack, RN, BSN, CCRN Alumnus RN CM Care Coordination/ Transition of [place] Management [phone]: direct office

## 2022-09-07 ENCOUNTER — Telehealth: Payer: Self-pay | Admitting: Internal Medicine

## 2022-09-07 LAB — SURGICAL PATHOLOGY

## 2022-09-07 NOTE — Telephone Encounter (Signed)
Called David Buck again no answer [facility] RTC.Marland KitchenJohny Buck

## 2022-09-07 NOTE — Telephone Encounter (Signed)
Spoke with patient and wife advise referral for PT has been placed at [facility]. Wife advised they have been in contact today with PT. Will close encounter since nothing further is needed.

## 2022-09-07 NOTE — Telephone Encounter (Signed)
Patient's wife [name], contacted the office with concerns regarding HHPT. Per Wife, [facility] did not receive order for HHPT. Order re faxed along with supporting documentation to [facility] at [phone]. Advised wife facility may contact our office with further questions.

## 2022-09-07 NOTE — Telephone Encounter (Signed)
Pt. Wife calling pt. Just got out of [facility]. But they need a PT referral and his PCP will not take care of it for them so trying to see if we could get Dr. Annamaria Boots to help get the order signed

## 2022-09-07 NOTE — Telephone Encounter (Signed)
Dr. Annamaria Boots please advise if your okay with me placing referral for physical therapy

## 2022-09-07 NOTE — Telephone Encounter (Signed)
It looks as if Dr Alain Marion, patient's PCP, has authorized home Physical Therapy at [facility]. I am really glad to see pathology from biopsy looks benign.

## 2022-09-09 NOTE — Telephone Encounter (Signed)
Done. Thank you.

## 2022-09-10 DIAGNOSIS — R262 Difficulty in walking, not elsewhere classified: Secondary | ICD-10-CM | POA: Diagnosis not present

## 2022-09-10 DIAGNOSIS — R0602 Shortness of breath: Secondary | ICD-10-CM | POA: Diagnosis not present

## 2022-09-10 DIAGNOSIS — M6281 Muscle weakness (generalized): Secondary | ICD-10-CM | POA: Diagnosis not present

## 2022-09-11 ENCOUNTER — Ambulatory Visit: Payer: Medicare Other | Admitting: Thoracic Surgery (Cardiothoracic Vascular Surgery)

## 2022-09-11 DIAGNOSIS — H353220 Exudative age-related macular degeneration, left eye, stage unspecified: Secondary | ICD-10-CM | POA: Diagnosis not present

## 2022-09-12 ENCOUNTER — Ambulatory Visit (INDEPENDENT_AMBULATORY_CARE_PROVIDER_SITE_OTHER): Payer: Self-pay | Admitting: Thoracic Surgery (Cardiothoracic Vascular Surgery)

## 2022-09-12 ENCOUNTER — Other Ambulatory Visit: Payer: Self-pay | Admitting: Thoracic Surgery (Cardiothoracic Vascular Surgery)

## 2022-09-12 ENCOUNTER — Encounter: Payer: Self-pay | Admitting: Thoracic Surgery (Cardiothoracic Vascular Surgery)

## 2022-09-12 ENCOUNTER — Ambulatory Visit
Admission: RE | Admit: 2022-09-12 | Discharge: 2022-09-12 | Disposition: A | Discharge status: Home / Self Care | Payer: Medicare Other | Source: Ambulatory Visit | Attending: Thoracic Surgery (Cardiothoracic Vascular Surgery) | Admitting: Thoracic Surgery (Cardiothoracic Vascular Surgery)

## 2022-09-12 VITALS — BP 94/61 | HR 88 | Resp 20 | Ht 74.0 in | Wt 171.0 lb

## 2022-09-12 DIAGNOSIS — R911 Solitary pulmonary nodule: Secondary | ICD-10-CM

## 2022-09-12 DIAGNOSIS — Z9889 Other specified postprocedural states: Secondary | ICD-10-CM

## 2022-09-12 MED ORDER — OXYCODONE HCL 5 MG PO TABS: 5.0000 mg | ORAL_TABLET | Freq: Four times a day (QID) | ORAL | 0 refills | Status: DC | PRN

## 2022-09-12 NOTE — Progress Notes (Signed)
[address]       [place] [address]             [phone]     HPI: [name] returns for follow-up visit after recent wedge resection for granulomatous disease.  David Buck is an 82 year old man with a history of tobacco abuse, COPD, MAC infection, hypertension, hypothyroidism, left anterior fascicular block, Mnire's disease, pneumonia, vertigo, syncope, aortic and coronary atherosclerosis, thyroid orbitopathy, reflux, and obstructive sleep apnea.  He has had a history of waxing and waning lung nodules with 2 negative bronchoscopies in the past.  More recently he was found to have a new 13 mm spiculated right upper lobe lung nodule along the fissure.  The nodule was hypermetabolic on PET.  I did a robotic assisted right VATS with upper and lower lobe wedge resections on 08/30/2022.  He did have some pretty vascular adhesions at the apex which were taken down.  He had some bleeding postoperatively and was anemic but did not require reexploration.  He had an air leak but developed a increasing pneumothorax even with the chest tube on suction.  A pigtail was placed.  Had some transient renal insufficiency due to acute kidney injury.  Was also treated empirically for bronchitis with azithromycin.  He went home on day 6.  He was originally scheduled to come to the office tomorrow but had some concerns and wanted to be seen today.  He complains of nonproductive cough, breathlessness when talking, inability to get incentive spirometer much about 1250 cc, and night sweats last night.  No fevers or chills.  Pain well-controlled.  Using 500 mg of acetaminophen 4-5 times a day and taking one 5 mg oxycodone tablet at night before he goes to bed.  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 Every four weeks     albuterol (VENTOLIN HFA) 108 (90 Base) MCG/ACT inhaler Inhale 2 puffs into the lungs every 4 (four) hours as needed for wheezing or shortness of breath. 8 g 3   ascorbic acid (VITAMIN C) 500 MG tablet Take 500 mg by mouth daily.     buPROPion (WELLBUTRIN XL) 150 MG 24 hr tablet Take 1 tablet (150 mg total) by mouth daily. 90 tablet 3   Cholecalciferol (VITAMIN D3) 50 MCG (2000 UT) TABS Take 2,000 Units by mouth every evening.     GLUCOSAMINE HCL PO Take 1,000 mg by mouth daily.     loratadine (CLARITIN) 10 MG tablet Take 1 tablet by mouth daily as needed for allergies.     LORazepam (ATIVAN) 0.5 MG tablet TAKE ONE TABLET BY MOUTH TWICE A DAY AS NEEDED FOR ANXIETY (Patient  taking differently: Take 0.5 mg by mouth daily.) 60 tablet 3   Multiple Vitamin (MULTIVITAMIN WITH MINERALS) TABS tablet Take 1 tablet by mouth every evening.     ondansetron (ZOFRAN) 4 MG tablet Take 1 tablet (4 mg total) by mouth every 8 (eight) hours as needed for nausea or vomiting. 20 tablet 0   pantoprazole (PROTONIX) 40 MG tablet Take 1 tablet (40 mg total) by mouth daily. (Patient taking differently: Take 40 mg by mouth daily as needed (heartburn).) 30 tablet 5   Polyethyl Glycol-Propyl Glycol (SYSTANE ULTRA) 0.4-0.3 % SOLN Place 1 drop into both eyes every evening.     ZINC PICOLINATE PO Take 22 mg by mouth every evening.     oxyCODONE (OXY IR/ROXICODONE) 5 MG immediate release tablet Take 1 tablet (5 mg total) by mouth every 6 (six) hours as needed  for moderate pain. 28 tablet 0   No current facility-administered medications for this visit.    Physical Exam BP 94/61 (BP Location: Left Arm, Patient Position: Sitting)   Pulse 88   Resp 20   Ht 6\' 2"  (1.88 m)   Wt 171 lb (77.6 kg)   SpO2 95% Comment: RA  BMI 21.73 kg/m  82 year old man in no acute distress Alert and oriented x 3 with no focal deficits Lungs essentially equal breath sounds possibly slightly diminished on right Cardiac regular rate and rhythm Incisions clean dry and intact Ecchymosis on right flank  Diagnostic Tests: CHEST - 2 VIEW   COMPARISON:  X-ray 09/04/2022 and older   FINDINGS: Decreasing right-sided hemithorax soft tissue gas. Persistent right-sided pleural effusion including some of the right lung apex. Surgical changes in the right lung with decreasing right midlung patchy opacity with some residual. The left lung is clear but hyperinflated. Normal cardiopericardial silhouette without edema.   IMPRESSION: Decreased right-sided lung base opacity with persistent right-sided effusion.   Decreasing chest wall gas with some residual     Electronically Signed   By: Jill Side M.D.   On: 09/12/2022 14:32 I personally reviewed the chest x-ray.  Improved subcutaneous emphysema.  No change in right apical "cap".  Improve opacities in right lung.  Left lung clear.  Impression: David Buck is an 82 year old man with a history of tobacco abuse, COPD, MAC infection, hypertension, hypothyroidism, left anterior fascicular block, Mnire's disease, pneumonia, vertigo, syncope, aortic and coronary atherosclerosis, thyroid orbitopathy, reflux, and obstructive sleep apnea.  Spiculated right upper lobe lung nodule-status post wedge resections of upper and lower lobe nodules.  Pathology showed fibroelastic scar with acute and chronic inflammation.  Second nodule was showed granulomatous inflammation.  Mislabeled on path report as "lower" lobe.  He  is now 13 days out from surgery.  I think he looks great and about as well as we could expect this early after his surgery and the mildly complicated course postoperatively.  He did have some bleeding and is pretty anemic and also had a persistent pneumo requiring a placement of second tube.  I tried to reassure him that it takes much longer than 13 days to recover from surgery.  He does have some night sweats last night.  Has not had fevers.  His daughter says that he has had other infections without fevers associated, but in the postop period I do not think were dealing with an infection at this point.  Mostly I think he is just at the 2-week point when people generally feel the worst after surgery.  The cough and breathlessness  when he talks are very common after any kind of thoracic surgery even a wedge resection.  At this point he is only taking 1 oxycodone at night before he goes to bed.  He does not have enough to last him another 2 weeks somata go ahead and give him a new prescription for that now.  Plan: He was instructed to call if he develops fevers, chills, has worsening night sweats, worsening cough or shortness of breath.  Return in 2 weeks with PA lateral chest x-ray  Melrose Nakayama, MD [facility] [phone]

## 2022-09-13 ENCOUNTER — Ambulatory Visit: Payer: Self-pay | Admitting: Thoracic Surgery (Cardiothoracic Vascular Surgery)

## 2022-09-14 ENCOUNTER — Other Ambulatory Visit: Payer: Self-pay | Admitting: Thoracic Surgery (Cardiothoracic Vascular Surgery)

## 2022-09-14 ENCOUNTER — Telehealth: Payer: Self-pay

## 2022-09-14 DIAGNOSIS — M6281 Muscle weakness (generalized): Secondary | ICD-10-CM | POA: Diagnosis not present

## 2022-09-14 DIAGNOSIS — R0602 Shortness of breath: Secondary | ICD-10-CM | POA: Diagnosis not present

## 2022-09-14 DIAGNOSIS — R262 Difficulty in walking, not elsewhere classified: Secondary | ICD-10-CM | POA: Diagnosis not present

## 2022-09-14 MED ORDER — GABAPENTIN 100 MG PO CAPS: 100.0000 mg | ORAL_CAPSULE | Freq: Three times a day (TID) | ORAL | 3 refills | Status: DC

## 2022-09-14 NOTE — Progress Notes (Signed)
Prescription for gabapentin

## 2022-09-14 NOTE — Telephone Encounter (Signed)
-----   Message from Melrose Nakayama, MD sent at 09/14/2022  2:50 PM EDT ----- Regarding: RE: Requests gabapentin rx I sent a prescription    Thanks  [facility] ----- Message ----- From: Donnella Sham, RN Sent: 09/14/2022  10:42 AM EDT To: Melrose Nakayama, MD Subject: Requests gabapentin rx                         Hey,  Patient's wife, [name] called the office today requesting a Gabapentin prescription. She says that he has been having abdominal pain below the ribs/ stomach and they forgot to mention this when they saw you two days ago. He described it as a surface pain that is sensitive to touch and uncomfortable. She states that he was given gabapentin in the hospital and it helped, but he was not discharged home with it. Please advise.  [facility] SV:[id number] - [place], [address]  Phone: [phone] Fax: [phone]  Thanks, Caryl Pina

## 2022-09-17 DIAGNOSIS — R262 Difficulty in walking, not elsewhere classified: Secondary | ICD-10-CM | POA: Diagnosis not present

## 2022-09-17 DIAGNOSIS — R0602 Shortness of breath: Secondary | ICD-10-CM | POA: Diagnosis not present

## 2022-09-17 DIAGNOSIS — M6281 Muscle weakness (generalized): Secondary | ICD-10-CM | POA: Diagnosis not present

## 2022-09-19 DIAGNOSIS — M6281 Muscle weakness (generalized): Secondary | ICD-10-CM | POA: Diagnosis not present

## 2022-09-19 DIAGNOSIS — R0602 Shortness of breath: Secondary | ICD-10-CM | POA: Diagnosis not present

## 2022-09-19 DIAGNOSIS — R262 Difficulty in walking, not elsewhere classified: Secondary | ICD-10-CM | POA: Diagnosis not present

## 2022-09-21 DIAGNOSIS — R0602 Shortness of breath: Secondary | ICD-10-CM | POA: Diagnosis not present

## 2022-09-21 DIAGNOSIS — M6281 Muscle weakness (generalized): Secondary | ICD-10-CM | POA: Diagnosis not present

## 2022-09-21 DIAGNOSIS — R262 Difficulty in walking, not elsewhere classified: Secondary | ICD-10-CM | POA: Diagnosis not present

## 2022-09-24 DIAGNOSIS — M6281 Muscle weakness (generalized): Secondary | ICD-10-CM | POA: Diagnosis not present

## 2022-09-24 DIAGNOSIS — R262 Difficulty in walking, not elsewhere classified: Secondary | ICD-10-CM | POA: Diagnosis not present

## 2022-09-24 DIAGNOSIS — R0602 Shortness of breath: Secondary | ICD-10-CM | POA: Diagnosis not present

## 2022-09-25 DIAGNOSIS — R262 Difficulty in walking, not elsewhere classified: Secondary | ICD-10-CM | POA: Diagnosis not present

## 2022-09-25 DIAGNOSIS — M6281 Muscle weakness (generalized): Secondary | ICD-10-CM | POA: Diagnosis not present

## 2022-09-25 DIAGNOSIS — R0602 Shortness of breath: Secondary | ICD-10-CM | POA: Diagnosis not present

## 2022-09-26 ENCOUNTER — Ambulatory Visit (INDEPENDENT_AMBULATORY_CARE_PROVIDER_SITE_OTHER): Payer: Medicare Other | Admitting: Emergency Medicine

## 2022-09-26 ENCOUNTER — Encounter: Payer: Self-pay | Admitting: Emergency Medicine

## 2022-09-26 ENCOUNTER — Other Ambulatory Visit: Payer: Self-pay | Admitting: Thoracic Surgery (Cardiothoracic Vascular Surgery)

## 2022-09-26 VITALS — BP 132/76 | HR 82 | Temp 97.8°F | Ht 74.0 in | Wt 168.4 lb

## 2022-09-26 DIAGNOSIS — R918 Other nonspecific abnormal finding of lung field: Secondary | ICD-10-CM | POA: Diagnosis not present

## 2022-09-26 DIAGNOSIS — R911 Solitary pulmonary nodule: Secondary | ICD-10-CM

## 2022-09-26 NOTE — Patient Instructions (Addendum)
We reviewed your biopsy results and culture results today. 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 We will plan to repeat a CT scan of the chest in August 2024 to compare with your prior. Follow Dr. Lamonte Sakai in August after your CT so we can review the results together.

## 2022-09-26 NOTE — Progress Notes (Signed)
Subjective:    Patient ID: David Buck, male    DOB: 06/22/1941, 82 y.o.   MRN: [id number]   HPI  ROV 08/17/2022 --follow-up visit for 82 year old gentleman with history of COPD and pulmonary nodular disease followed by Dr. Annamaria Boots in our office.  I saw him 07/18/2022 to evaluate a right upper lobe spiculated 13 mm cavitary pulmonary nodule.  We arranged for PET scan/super D CT as below.  Super D CT 08/08/2022 reviewed by me shows a slight increase in size of his spiculated posterior right upper lobe pulmonary nodule now 15 mm.  Other areas of peripheral tree-in-bud nodularity and bronchiectatic change, less prominent than on his prior imaging.  PET scan 08/08/2022 shows that the dominant hypermetabolic spiculated solid pulmonary nodule has slightly enlarged compared with multiple exams going back to 2017.  Additional scattered small pulmonary nodules in the right lower lobe have some hypermetabolism.  Others have decreased in size (waxing and waning).   ROV 09/26/22 --David Buck is 82 years old, has a history of COPD and pulmonary nodular disease.  He has been followed by Dr. Annamaria Boots in our office and I saw him in February to evaluate a spiculated 13 mm cavitary right upper lobe pulmonary nodule.  I referred him to see Dr. Roxan Hockey for robotic VATS resection which was done on 08/30/2022.  His wedge resection showed fibroelastic scar with focal acute and chronic inflammation without any overt granulomas and no evidence of carcinoma.  The excision showed granulomatous inflammation with some associated organizing pneumonia.  AFB and fungal smears were negative.  He is AFB culture was positive at the 2-week mark, ultimately showed Mycobacterium avium, negative for tuberculosis.  I do not see any susceptibility testing at this time.  He has occasional SOB w exertion. He is ambulating w a walker. He has seen some exertional desaturations to 88% when he is working w PT.    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   Hypertension    Hypothyroidism    Hx   LAFB (left anterior fascicular block) 2008   on EKG   Meniere's disease    OSA (obstructive sleep apnea) 01/25/2022   Osteoarthritis of right knee    Dr Percell Miller   Pneumonia    2017, 2022   Posterior vitreous detachment 2011   R, body- Dr Baird Cancer   Prostatitis    Tinnitus      Family History  Problem Relation Age of Onset   Cancer Mother        ovarian and bowel   Other Father        cerebral hemorrage   Hypertension Other      Social History   Socioeconomic History   Marital status: Married    Spouse name: Not on file   Number of children: Not on file   Years of education: Not on file   Highest education level: Not on file  Occupational History   Not on file  Tobacco Use   Smoking status: Former    Packs/day: 1.00    Years: 50.00    Additional pack years: 0.00    Total pack years: 50.00    Types: Cigarettes    Start date:  30    Quit date: 01/17/2021    Years since quitting: 1.6   Smokeless tobacco: Never  Vaping Use   Vaping Use: Never used  Substance and Sexual Activity   Alcohol use: Yes    Comment: rarely   Drug use: No   Sexual activity: Yes  Other Topics Concern   Not on file  Social History Narrative   Not on file   Social Determinants of Health   Financial Resource Strain: Low Risk  (06/28/2022)   Overall Financial Resource Strain (CARDIA)    Difficulty of Paying Living Expenses: Not hard at all  Food Insecurity: No Food Insecurity (09/06/2022)   Hunger Vital Sign    Worried About Running Out of Food in the Last Year: Never true    Ran Out of Food in the Last Year: Never true  Transportation Needs: No Transportation Needs (09/06/2022)   PRAPARE - Radiographer, therapeutic (Medical): No    Lack of Transportation (Non-Medical): No  Physical Activity: Sufficiently Active (06/28/2022)   Exercise Vital Sign    Days of Exercise per Week: 5 days    Minutes of Exercise per Session: 30 min  Stress: No Stress Concern Present (06/28/2022)   [place]    Feeling of Stress : Not at all  Social Connections: Moderately Isolated (06/28/2022)   Social Connection and Isolation Panel [NHANES]    Frequency of Communication with Friends and Family: More than three times a week    Frequency of Social Gatherings with Friends and Family: Never    Attends Religious Services: Never    Marine scientist or Organizations: No    Attends Archivist Meetings: Never    Marital Status: Married  Human resources officer Violence: Not At Risk (06/28/2022)   Humiliation, Afraid, Rape, and Kick questionnaire    Fear of Current or Ex-Partner: No    Emotionally Abused: No    Physically Abused: No    Sexually Abused: No     Allergies  Allergen Reactions   Cefdinir Rash   Cephalosporins Rash     Outpatient Medications Prior to Visit  Medication Sig Dispense Refill   ascorbic acid (VITAMIN C) 500 MG tablet Take 500 mg by mouth daily.     buPROPion (WELLBUTRIN XL) 150 MG 24 hr tablet Take 1 tablet (150 mg total) by mouth daily. 90 tablet 3   Cholecalciferol (VITAMIN D3) 50 MCG (2000 UT) TABS Take 2,000 Units by mouth every evening.     gabapentin (NEURONTIN) 100 MG capsule Take 1 capsule (100 mg total) by mouth 3 (three) times daily. Take one tablet at night for 3 days, then increase to twice daily, then if tolerated after 3 days increase to three times a day 90 capsule 3   GLUCOSAMINE HCL PO Take 1,000 mg by mouth daily.     loratadine (CLARITIN) 10 MG tablet Take 1 tablet by mouth daily as needed for allergies.     LORazepam (ATIVAN) 0.5 MG tablet TAKE ONE TABLET BY MOUTH TWICE A DAY AS NEEDED FOR ANXIETY  (Patient taking differently: Take 0.5 mg by mouth daily.) 60 tablet 3   Multiple Vitamin (MULTIVITAMIN WITH MINERALS) TABS tablet Take 1 tablet by mouth every evening.     ondansetron (ZOFRAN) 4 MG tablet Take 1 tablet (4 mg total) by mouth every 8 (eight) hours as needed for nausea or vomiting. 20 tablet 0   oxyCODONE (OXY IR/ROXICODONE)  5 MG immediate release tablet Take 1 tablet (5 mg total) by mouth every 6 (six) hours as needed for moderate pain. 28 tablet 0   pantoprazole (PROTONIX) 40 MG tablet Take 1 tablet (40 mg total) by mouth daily. (Patient taking differently: Take 40 mg by mouth daily as needed (heartburn).) 30 tablet 5   Polyethyl Glycol-Propyl Glycol (SYSTANE ULTRA) 0.4-0.3 % SOLN Place 1 drop into both eyes every evening.     ZINC PICOLINATE PO Take 22 mg by mouth every evening.     Aflibercept (EYLEA IO) Inject into the eye See admin instructions. Every four weeks     albuterol (VENTOLIN HFA) 108 (90 Base) MCG/ACT inhaler Inhale 2 puffs into the lungs every 4 (four) hours as needed for wheezing or shortness of breath. (Patient not taking: Reported on 09/26/2022) 8 g 3   No facility-administered medications prior to visit.         Objective:   Physical Exam Vitals:   09/26/22 1537  BP: 132/76  Pulse: 82  Temp: 97.8 F (36.6 C)  TempSrc: Oral  SpO2: 96%  Weight: 168 lb 6.4 oz (76.4 kg)  Height: 6\' 2"  (1.88 m)   Gen: Pleasant, thin man, in no distress,  normal affect  ENT: No lesions,  mouth clear,  oropharynx clear, no postnasal drip  Neck: No JVD, no stridor  Lungs: No use of accessory muscles, no crackles or wheezing on normal respiration, no wheeze on forced expiration  Cardiovascular: RRR, heart sounds normal, no murmur or gallops, no peripheral edema  Musculoskeletal: No deformities, no cyanosis or clubbing  Neuro: alert, awake, non focal  Skin: Warm, no lesions or rash       Assessment & Plan:  Pulmonary nodules I referred him for resection of his  right upper lobe enlarging nodule that was hypermetabolic.  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  Sensitivities are still pending.  Unclear that he needs to be treated for this at this time.  We will do surveillance CT, follow his imaging, follow his clinical status.  If he does require treatment then we will initiate.   Baltazar Apo, MD, PhD 09/26/2022, 4:12 PM [facility] [phone] or if no answer before 7:00PM call [phone] For any issues after 7:00PM please call eLink [phone]

## 2022-09-26 NOTE — Assessment & Plan Note (Signed)
I referred him for resection of his right upper lobe enlarging nodule that was hypermetabolic.  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  Sensitivities are still pending.  Unclear that he needs to be treated for this at this time.  We will do surveillance CT, follow his imaging, follow his clinical status.  If he does require treatment then we will initiate.

## 2022-09-27 ENCOUNTER — Ambulatory Visit (INDEPENDENT_AMBULATORY_CARE_PROVIDER_SITE_OTHER): Payer: Self-pay | Admitting: Thoracic Surgery (Cardiothoracic Vascular Surgery)

## 2022-09-27 ENCOUNTER — Ambulatory Visit
Admission: RE | Admit: 2022-09-27 | Discharge: 2022-09-27 | Disposition: A | Discharge status: Home / Self Care | Payer: Medicare Other | Source: Ambulatory Visit | Attending: Thoracic Surgery (Cardiothoracic Vascular Surgery) | Admitting: Thoracic Surgery (Cardiothoracic Vascular Surgery)

## 2022-09-27 ENCOUNTER — Encounter: Payer: Self-pay | Admitting: Thoracic Surgery (Cardiothoracic Vascular Surgery)

## 2022-09-27 VITALS — BP 99/66 | HR 76 | Resp 20 | Ht 74.0 in | Wt 168.0 lb

## 2022-09-27 DIAGNOSIS — R0602 Shortness of breath: Secondary | ICD-10-CM | POA: Diagnosis not present

## 2022-09-27 DIAGNOSIS — Z9889 Other specified postprocedural states: Secondary | ICD-10-CM

## 2022-09-27 DIAGNOSIS — R911 Solitary pulmonary nodule: Secondary | ICD-10-CM

## 2022-09-27 NOTE — Progress Notes (Signed)
[address]       ,[place] [address]             [phone]     HPI: [name] returns for scheduled follow-up after recent wedge resection for granulomatous disease.  David Buck is an 82 year old man with a history of tobacco abuse, COPD, MAC, hypertension, hypothyroidism, Mnire's disease, left anterior fascicular block, pneumonia, vertigo, syncope, aortic and coronary atherosclerosis, thyroid orbitopathy, reflux, and obstructive sleep apnea.  He has had a history of waxing and waning lung nodules.  Recently had a 13 mm spiculated nodule that was hypermetabolic on PET.  Underwent a robotic assisted right upper and lower lobe wedge resections on 08/30/2022.  He had some bleeding postoperatively and an air leak which required pigtail catheter placement.  Also transient renal insufficiency.  Went home on postoperative day #6.  I saw him back on 09/12/2022.  He was having some cough and some breathlessness.  I operated gabapentin but he did not want to do that, but a couple days later he called back and I gave him a prescription for that.  The plan was for 100 mg 3 times daily ramping up on the dose.  He was taking 100 mg in the morning and 200 mg in the evening but stopped taking it few days ago because he did not think it was doing anything.  Currently taking an Advil in the morning, Tylenol in the afternoon, and another Advil in the evening.  Complains of some pain along the right costal margin.  He has been working with physical therapy.  They have noted that his sats drop a little bit with exertion.  Also complains of poor appetite.  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 8 g 3   ascorbic acid (VITAMIN C) 500 MG tablet Take 500 mg by mouth daily.     buPROPion (WELLBUTRIN XL) 150 MG 24 hr tablet Take 1 tablet (150 mg total) by mouth daily. 90 tablet 3   Cholecalciferol (VITAMIN D3) 50 MCG (2000 UT) TABS Take 2,000 Units by mouth every evening.     gabapentin (NEURONTIN) 100 MG capsule Take 1 capsule (100 mg total) by mouth 3 (three) times daily. Take one tablet at night for 3 days, then increase to twice daily, then if tolerated after 3 days increase to three times a day 90 capsule 3   GLUCOSAMINE HCL PO Take 1,000 mg by mouth daily.     loratadine (CLARITIN) 10 MG tablet Take 1 tablet by mouth daily as needed for allergies.  LORazepam (ATIVAN) 0.5 MG tablet TAKE ONE TABLET BY MOUTH TWICE A DAY AS NEEDED FOR ANXIETY (Patient taking differently: Take 0.5 mg by mouth daily.) 60 tablet 3   Multiple Vitamin (MULTIVITAMIN WITH MINERALS) TABS tablet Take 1 tablet by mouth every evening.     ondansetron (ZOFRAN) 4 MG tablet Take 1 tablet (4 mg total) by mouth every 8 (eight) hours as needed for nausea or vomiting. 20 tablet 0   oxyCODONE (OXY IR/ROXICODONE) 5 MG immediate release tablet Take 1 tablet (5 mg total) by mouth every 6 (six) hours as needed for moderate pain. 28 tablet 0   pantoprazole (PROTONIX) 40 MG tablet Take 1 tablet (40 mg total) by mouth daily. (Patient taking differently: Take 40 mg by mouth daily as needed (heartburn).) 30 tablet 5   Polyethyl Glycol-Propyl Glycol (SYSTANE ULTRA) 0.4-0.3 % SOLN Place 1 drop into both eyes every  evening.     ZINC PICOLINATE PO Take 22 mg by mouth every evening.     Aflibercept (EYLEA IO) Inject into the eye See admin instructions. Every four weeks     No current facility-administered medications for this visit.    Physical Exam BP 99/66   Pulse 76   Resp 20   Ht 6\' 2"  (1.88 m)   Wt 168 lb (76.2 kg)   SpO2 97% Comment: RA  BMI 21.35 kg/m  82 year old man in no acute distress Alert and oriented x 3 with no focal deficits Lungs slightly diminished on right compared to left Incisions healing well  Diagnostic Tests: CHEST - 2 VIEW   COMPARISON:  09/12/2022.   FINDINGS: Evolving postoperative changes in the right hemithorax with interval resolution of subcutaneous gas along the right chest wall. The left lung is clear. Stable cardiac and mediastinal contours. No pneumothorax.   IMPRESSION: Evolving postoperative changes in the right hemithorax with interval resolution of subcutaneous gas along the right chest wall.     Electronically Signed   By: Emmit Alexanders M.D.   On: 09/27/2022 14:45 Chest x-ray images reviewed.  Some improvement compared to previous film.  Impression:  David Buck is an 82 year old man with a history of tobacco abuse, COPD, MAC, hypertension, hypothyroidism, Mnire's disease, left anterior fascicular block, pneumonia, vertigo, syncope, aortic and coronary atherosclerosis, thyroid orbitopathy, reflux, and obstructive sleep apnea.  Right upper lobe lung nodule-cultures positive for MAC.  He and Dr. Lamonte Sakai discussed possible antibiotic treatment but opted for observation.  Planning to do another CT in a few months.  Status post wedge resection-complicated by pneumothorax and bleeding from apical adhesions.  Still some shortness of breath.  Likely multifactorial with surgery and anemia.  I tried to reassure him that it will improve, but it takes time and there is no way to accelerate the process.  Postoperative pain-not out of the  ordinary for this type of surgery.  I advised him to go ahead and take the gabapentin on a regular basis.  I explained that it does not work like a regular pain pill.  He also can use more Advil and Tylenol up to the recommended doses if needed.  Plan: Resume gabapentin Return in 6 weeks with PA lateral chest x-ray  Melrose Nakayama, MD [facility] [phone]

## 2022-09-28 DIAGNOSIS — M6281 Muscle weakness (generalized): Secondary | ICD-10-CM | POA: Diagnosis not present

## 2022-09-28 DIAGNOSIS — R0602 Shortness of breath: Secondary | ICD-10-CM | POA: Diagnosis not present

## 2022-09-28 DIAGNOSIS — R262 Difficulty in walking, not elsewhere classified: Secondary | ICD-10-CM | POA: Diagnosis not present

## 2022-10-01 DIAGNOSIS — M6281 Muscle weakness (generalized): Secondary | ICD-10-CM | POA: Diagnosis not present

## 2022-10-01 DIAGNOSIS — R0602 Shortness of breath: Secondary | ICD-10-CM | POA: Diagnosis not present

## 2022-10-01 DIAGNOSIS — R262 Difficulty in walking, not elsewhere classified: Secondary | ICD-10-CM | POA: Diagnosis not present

## 2022-10-03 DIAGNOSIS — R262 Difficulty in walking, not elsewhere classified: Secondary | ICD-10-CM | POA: Diagnosis not present

## 2022-10-03 DIAGNOSIS — M6281 Muscle weakness (generalized): Secondary | ICD-10-CM | POA: Diagnosis not present

## 2022-10-03 DIAGNOSIS — R0602 Shortness of breath: Secondary | ICD-10-CM | POA: Diagnosis not present

## 2022-10-03 LAB — ACID FAST CULTURE WITH REFLEXED SENSITIVITIES (MYCOBACTERIA): Acid Fast Culture: POSITIVE — AB

## 2022-10-03 LAB — MAC SUSCEPTIBILITY BROTH
Ciprofloxacin: >8
Clarithromycin: 2
Doxycycline: >8
Linezolid: 32
Minocycline: >8
Moxifloxacin: 4
Rifabutin: 0.5
Rifampin: >4
Streptomycin: >32

## 2022-10-03 LAB — ACID FAST ID BY PCR AND SUSCEPTIBILITIES
M Tuberculosis Complex: NEGATIVE
M avium complex: POSITIVE — AB

## 2022-10-05 ENCOUNTER — Other Ambulatory Visit: Payer: Self-pay | Admitting: Internal Medicine

## 2022-10-05 DIAGNOSIS — R262 Difficulty in walking, not elsewhere classified: Secondary | ICD-10-CM | POA: Diagnosis not present

## 2022-10-05 DIAGNOSIS — R0602 Shortness of breath: Secondary | ICD-10-CM | POA: Diagnosis not present

## 2022-10-05 DIAGNOSIS — M6281 Muscle weakness (generalized): Secondary | ICD-10-CM | POA: Diagnosis not present

## 2022-10-05 LAB — FUNGUS CULTURE WITH STAIN

## 2022-10-05 LAB — FUNGUS CULTURE RESULT

## 2022-10-05 LAB — FUNGAL ORGANISM REFLEX

## 2022-10-08 DIAGNOSIS — M6281 Muscle weakness (generalized): Secondary | ICD-10-CM | POA: Diagnosis not present

## 2022-10-08 DIAGNOSIS — R0602 Shortness of breath: Secondary | ICD-10-CM | POA: Diagnosis not present

## 2022-10-08 DIAGNOSIS — R262 Difficulty in walking, not elsewhere classified: Secondary | ICD-10-CM | POA: Diagnosis not present

## 2022-10-09 DIAGNOSIS — H353221 Exudative age-related macular degeneration, left eye, with active choroidal neovascularization: Secondary | ICD-10-CM | POA: Diagnosis not present

## 2022-10-10 DIAGNOSIS — M6281 Muscle weakness (generalized): Secondary | ICD-10-CM | POA: Diagnosis not present

## 2022-10-10 DIAGNOSIS — R262 Difficulty in walking, not elsewhere classified: Secondary | ICD-10-CM | POA: Diagnosis not present

## 2022-10-10 DIAGNOSIS — R0602 Shortness of breath: Secondary | ICD-10-CM | POA: Diagnosis not present

## 2022-10-12 DIAGNOSIS — R0602 Shortness of breath: Secondary | ICD-10-CM | POA: Diagnosis not present

## 2022-10-12 DIAGNOSIS — R262 Difficulty in walking, not elsewhere classified: Secondary | ICD-10-CM | POA: Diagnosis not present

## 2022-10-12 DIAGNOSIS — M6281 Muscle weakness (generalized): Secondary | ICD-10-CM | POA: Diagnosis not present

## 2022-10-15 DIAGNOSIS — M6281 Muscle weakness (generalized): Secondary | ICD-10-CM | POA: Diagnosis not present

## 2022-10-15 DIAGNOSIS — R0602 Shortness of breath: Secondary | ICD-10-CM | POA: Diagnosis not present

## 2022-10-15 DIAGNOSIS — R262 Difficulty in walking, not elsewhere classified: Secondary | ICD-10-CM | POA: Diagnosis not present

## 2022-10-17 DIAGNOSIS — R0602 Shortness of breath: Secondary | ICD-10-CM | POA: Diagnosis not present

## 2022-10-17 DIAGNOSIS — R262 Difficulty in walking, not elsewhere classified: Secondary | ICD-10-CM | POA: Diagnosis not present

## 2022-10-17 DIAGNOSIS — M6281 Muscle weakness (generalized): Secondary | ICD-10-CM | POA: Diagnosis not present

## 2022-10-19 DIAGNOSIS — R0602 Shortness of breath: Secondary | ICD-10-CM | POA: Diagnosis not present

## 2022-10-19 DIAGNOSIS — R262 Difficulty in walking, not elsewhere classified: Secondary | ICD-10-CM | POA: Diagnosis not present

## 2022-10-19 DIAGNOSIS — M6281 Muscle weakness (generalized): Secondary | ICD-10-CM | POA: Diagnosis not present

## 2022-10-22 DIAGNOSIS — M6281 Muscle weakness (generalized): Secondary | ICD-10-CM | POA: Diagnosis not present

## 2022-10-22 DIAGNOSIS — R0602 Shortness of breath: Secondary | ICD-10-CM | POA: Diagnosis not present

## 2022-10-22 DIAGNOSIS — R262 Difficulty in walking, not elsewhere classified: Secondary | ICD-10-CM | POA: Diagnosis not present

## 2022-10-24 DIAGNOSIS — R0602 Shortness of breath: Secondary | ICD-10-CM | POA: Diagnosis not present

## 2022-10-24 DIAGNOSIS — M6281 Muscle weakness (generalized): Secondary | ICD-10-CM | POA: Diagnosis not present

## 2022-10-24 DIAGNOSIS — R262 Difficulty in walking, not elsewhere classified: Secondary | ICD-10-CM | POA: Diagnosis not present

## 2022-10-26 DIAGNOSIS — R262 Difficulty in walking, not elsewhere classified: Secondary | ICD-10-CM | POA: Diagnosis not present

## 2022-10-26 DIAGNOSIS — R0602 Shortness of breath: Secondary | ICD-10-CM | POA: Diagnosis not present

## 2022-10-26 DIAGNOSIS — M6281 Muscle weakness (generalized): Secondary | ICD-10-CM | POA: Diagnosis not present

## 2022-10-30 DIAGNOSIS — R262 Difficulty in walking, not elsewhere classified: Secondary | ICD-10-CM | POA: Diagnosis not present

## 2022-10-30 DIAGNOSIS — H353221 Exudative age-related macular degeneration, left eye, with active choroidal neovascularization: Secondary | ICD-10-CM | POA: Diagnosis not present

## 2022-10-30 DIAGNOSIS — R0602 Shortness of breath: Secondary | ICD-10-CM | POA: Diagnosis not present

## 2022-10-30 DIAGNOSIS — M6281 Muscle weakness (generalized): Secondary | ICD-10-CM | POA: Diagnosis not present

## 2022-10-31 DIAGNOSIS — R262 Difficulty in walking, not elsewhere classified: Secondary | ICD-10-CM | POA: Diagnosis not present

## 2022-10-31 DIAGNOSIS — R0602 Shortness of breath: Secondary | ICD-10-CM | POA: Diagnosis not present

## 2022-10-31 DIAGNOSIS — M6281 Muscle weakness (generalized): Secondary | ICD-10-CM | POA: Diagnosis not present

## 2022-11-02 DIAGNOSIS — R262 Difficulty in walking, not elsewhere classified: Secondary | ICD-10-CM | POA: Diagnosis not present

## 2022-11-02 DIAGNOSIS — M6281 Muscle weakness (generalized): Secondary | ICD-10-CM | POA: Diagnosis not present

## 2022-11-02 DIAGNOSIS — R0602 Shortness of breath: Secondary | ICD-10-CM | POA: Diagnosis not present

## 2022-11-05 DIAGNOSIS — R0602 Shortness of breath: Secondary | ICD-10-CM | POA: Diagnosis not present

## 2022-11-05 DIAGNOSIS — R262 Difficulty in walking, not elsewhere classified: Secondary | ICD-10-CM | POA: Diagnosis not present

## 2022-11-05 DIAGNOSIS — M6281 Muscle weakness (generalized): Secondary | ICD-10-CM | POA: Diagnosis not present

## 2022-11-06 DIAGNOSIS — H353221 Exudative age-related macular degeneration, left eye, with active choroidal neovascularization: Secondary | ICD-10-CM | POA: Diagnosis not present

## 2022-11-07 DIAGNOSIS — M6281 Muscle weakness (generalized): Secondary | ICD-10-CM | POA: Diagnosis not present

## 2022-11-07 DIAGNOSIS — R262 Difficulty in walking, not elsewhere classified: Secondary | ICD-10-CM | POA: Diagnosis not present

## 2022-11-07 DIAGNOSIS — R0602 Shortness of breath: Secondary | ICD-10-CM | POA: Diagnosis not present

## 2022-11-09 DIAGNOSIS — M6281 Muscle weakness (generalized): Secondary | ICD-10-CM | POA: Diagnosis not present

## 2022-11-09 DIAGNOSIS — R0602 Shortness of breath: Secondary | ICD-10-CM | POA: Diagnosis not present

## 2022-11-09 DIAGNOSIS — R262 Difficulty in walking, not elsewhere classified: Secondary | ICD-10-CM | POA: Diagnosis not present

## 2022-11-12 ENCOUNTER — Other Ambulatory Visit: Payer: Self-pay | Admitting: Thoracic Surgery (Cardiothoracic Vascular Surgery)

## 2022-11-12 DIAGNOSIS — R262 Difficulty in walking, not elsewhere classified: Secondary | ICD-10-CM | POA: Diagnosis not present

## 2022-11-12 DIAGNOSIS — R0602 Shortness of breath: Secondary | ICD-10-CM | POA: Diagnosis not present

## 2022-11-12 DIAGNOSIS — M6281 Muscle weakness (generalized): Secondary | ICD-10-CM | POA: Diagnosis not present

## 2022-11-12 DIAGNOSIS — R918 Other nonspecific abnormal finding of lung field: Secondary | ICD-10-CM

## 2022-11-13 ENCOUNTER — Other Ambulatory Visit: Payer: Self-pay | Admitting: Thoracic Surgery (Cardiothoracic Vascular Surgery)

## 2022-11-13 ENCOUNTER — Ambulatory Visit (INDEPENDENT_AMBULATORY_CARE_PROVIDER_SITE_OTHER): Payer: Medicare Other | Admitting: Thoracic Surgery (Cardiothoracic Vascular Surgery)

## 2022-11-13 ENCOUNTER — Encounter: Payer: Self-pay | Admitting: Thoracic Surgery (Cardiothoracic Vascular Surgery)

## 2022-11-13 ENCOUNTER — Ambulatory Visit
Admission: RE | Admit: 2022-11-13 | Discharge: 2022-11-13 | Disposition: A | Discharge status: Home / Self Care | Payer: Medicare Other | Source: Ambulatory Visit | Attending: Thoracic Surgery (Cardiothoracic Vascular Surgery) | Admitting: Thoracic Surgery (Cardiothoracic Vascular Surgery)

## 2022-11-13 VITALS — BP 99/66 | HR 68 | Resp 20 | Ht 74.0 in | Wt 163.0 lb

## 2022-11-13 DIAGNOSIS — R911 Solitary pulmonary nodule: Secondary | ICD-10-CM | POA: Diagnosis not present

## 2022-11-13 DIAGNOSIS — J90 Pleural effusion, not elsewhere classified: Secondary | ICD-10-CM | POA: Diagnosis not present

## 2022-11-13 DIAGNOSIS — R918 Other nonspecific abnormal finding of lung field: Secondary | ICD-10-CM

## 2022-11-13 DIAGNOSIS — R0602 Shortness of breath: Secondary | ICD-10-CM | POA: Diagnosis not present

## 2022-11-13 DIAGNOSIS — J449 Chronic obstructive pulmonary disease, unspecified: Secondary | ICD-10-CM | POA: Diagnosis not present

## 2022-11-13 DIAGNOSIS — Z9889 Other specified postprocedural states: Secondary | ICD-10-CM | POA: Insufficient documentation

## 2022-11-13 LAB — CBC WITH DIFFERENTIAL/PLATELET
Absolute Monocytes: 658 cells/uL (ref 200–950)
Basophils Absolute: 63 cells/uL (ref 0–200)
Basophils Relative: 0.9 %
Eosinophils Absolute: 371 cells/uL (ref 15–500)
Eosinophils Relative: 5.3 %
HCT: 35.4 % — ABNORMAL LOW (ref 38.5–50.0)
Hemoglobin: 11.7 g/dL — ABNORMAL LOW (ref 13.2–17.1)
Lymphs Abs: 1582 cells/uL (ref 850–3900)
MCH: 29.5 pg (ref 27.0–33.0)
MCHC: 33.1 g/dL (ref 32.0–36.0)
MCV: 89.2 fL (ref 80.0–100.0)
MPV: 11.1 fL (ref 7.5–12.5)
Monocytes Relative: 9.4 %
Neutro Abs: 4326 cells/uL (ref 1500–7800)
Neutrophils Relative %: 61.8 %
Platelets: 349 10*3/uL (ref 140–400)
RBC: 3.97 10*6/uL — ABNORMAL LOW (ref 4.20–5.80)
RDW: 13 % (ref 11.0–15.0)
Total Lymphocyte: 22.6 %
WBC: 7 10*3/uL (ref 3.8–10.8)

## 2022-11-13 NOTE — Progress Notes (Signed)
[address]       David Buck [address]             [phone]     HPI: [name] returns for follow-up after lung resection.  David Buck is a a an 82 year old man with a history of tobacco abuse, COPD, MAC, hypertension, hypothyroidism, Mnire's disease, left anterior fascicular block, pneumonia, vertigo, syncope, aortic and coronary atherosclerosis, thyroid orbitopathy, reflux, and obstructive sleep apnea.  He had a suspicious lung nodule on a CT of the chest that was hypermetabolic on PET/CT.    Underwent robotic assisted right upper and lower lobe wedge resections in March 2024.  Postoperative course was complicated by bleeding and an air leak.  He finally went home on postoperative day #6.  Pathology showed granulomatous disease.  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  I saw him on 09/27/2022.  He was still having postoperative pain at that point and also was having some shortness of breath and was fatiguing easily.  In the interim his pain has improved significantly.  Currently using Tylenol twice a day.  Also taking gabapentin 100 mg twice daily.  He been prescribed 100 mg 3 times a day.  He started taking 200 mg twice a day.  A couple of weeks back he cut down to 100 mg twice a day.  He has not noticed any increase in his pain since then.  He has been doing physical therapy.  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 8 g 3   ascorbic acid (VITAMIN C) 500 MG tablet Take 500 mg by mouth daily.     buPROPion (WELLBUTRIN XL) 150 MG 24 hr tablet Take 1 tablet (150 mg total) by mouth daily. 90 tablet 3   Cholecalciferol (VITAMIN D3) 50 MCG (2000 UT) TABS Take 2,000 Units by mouth every evening.     gabapentin (NEURONTIN) 100 MG capsule Take 1 capsule (100 mg total) by mouth 3 (three) times daily. Take one tablet at night for 3 days, then increase to twice daily, then if tolerated after 3 days increase to three times a day 90 capsule 3   GLUCOSAMINE HCL PO Take 1,000 mg by mouth daily.     loratadine (CLARITIN) 10 MG tablet Take 1 tablet by mouth daily as needed for allergies.     LORazepam (ATIVAN) 0.5 MG tablet TAKE ONE TABLET BY MOUTH TWICE A DAY AS NEEDED FOR ANXIETY 60 tablet 3   Multiple Vitamin (MULTIVITAMIN WITH MINERALS) TABS tablet Take 1 tablet by mouth every evening.     ondansetron (ZOFRAN) 4  MG tablet Take 1 tablet (4 mg total) by mouth every 8 (eight) hours as needed for nausea or vomiting. 20 tablet 0   pantoprazole (PROTONIX) 40 MG tablet Take 1 tablet (40 mg total) by mouth daily. (Patient taking differently: Take 40 mg by mouth daily as needed (heartburn).) 30 tablet 5   Polyethyl Glycol-Propyl Glycol (SYSTANE ULTRA) 0.4-0.3 % SOLN Place 1 drop into both eyes every evening.     ZINC PICOLINATE PO Take 22 mg by mouth every evening.     No current facility-administered medications for this visit.    Physical Exam BP 99/66 (BP Location: Left Arm, Patient Position: Sitting, Cuff Size: Normal)   Pulse 68   Resp 20   Ht 6\' 2"  (1.88 m)   Wt 163 lb (73.9 kg)   SpO2 94% Comment: RA  BMI 20.3 kg/m  82 year old man in no acute distress Alert and oriented x 3 with no focal deficits Lungs diminished breath sounds bilaterally, no  rales or wheezing Cardiac regular rate and rhythm  Diagnostic Tests: CHEST - 2 VIEW   COMPARISON:  Previous studies including the examination done on 09/27/2022   FINDINGS: Cardiac size is within normal limits. Flattening of diaphragms suggesting COPD. There is asymmetric pleural thickening in the right apex. Increased markings are seen in right perihilar region and right lower lung field. There is slight improvement in aeration in right lung. There is blunting of right lateral costophrenic angle. There is no pneumothorax. Left lung shows no new infiltrates or pulmonary edema. Left lateral CP angle is clear.   IMPRESSION: COPD. Increased interstitial markings in right parahilar region and right lower lung fields suggest scarring or interstitial pneumonia. Small right pleural effusion. Left lung is clear.     Electronically Signed   By: Ernie Avena M.D.   On: 11/13/2022 14:44 I personally reviewed his chest x-ray.  Pleural thickening at right apex is improved.  COPD.  Impression: David Buck is a a an 82 year old man with a history of tobacco abuse, COPD, MAC, hypertension, hypothyroidism, Mnire's disease, left anterior fascicular block, pneumonia, vertigo, syncope, aortic and coronary atherosclerosis, thyroid orbitopathy, reflux, and obstructive sleep apnea.    He underwent right upper lobe and right lower lobe wedge resections for granulomatous disease on 08/30/2022.  Postoperative course complicated by bleeding and air leak.  He has made significant progress since his last visit.  Still has some shortness of breath with exertion although that is improved significantly.  He has been taking a multivitamin with iron.  Will check a CBC to see where his hematocrit is.  Postoperative pain-improved but not completely resolved.  He has some discomfort along the right costal margin and then he will occasionally have a sharp pain.  Currently taking gabapentin 100 mg twice daily.   He is can cut that down to 100 mg at night for a week and if there is no issues he will stop the medication altogether.  Still using some Tylenol but not narcotics.  Plan: Check CBC Return in 6 weeks to check on progress  Loreli Slot, MD [facility] [phone]

## 2022-11-14 DIAGNOSIS — M6281 Muscle weakness (generalized): Secondary | ICD-10-CM | POA: Diagnosis not present

## 2022-11-14 DIAGNOSIS — R0602 Shortness of breath: Secondary | ICD-10-CM | POA: Diagnosis not present

## 2022-11-14 DIAGNOSIS — R262 Difficulty in walking, not elsewhere classified: Secondary | ICD-10-CM | POA: Diagnosis not present

## 2022-11-16 ENCOUNTER — Telehealth: Payer: Self-pay | Admitting: Emergency Medicine

## 2022-11-16 DIAGNOSIS — R0602 Shortness of breath: Secondary | ICD-10-CM | POA: Diagnosis not present

## 2022-11-16 DIAGNOSIS — R262 Difficulty in walking, not elsewhere classified: Secondary | ICD-10-CM | POA: Diagnosis not present

## 2022-11-16 DIAGNOSIS — M6281 Muscle weakness (generalized): Secondary | ICD-10-CM | POA: Diagnosis not present

## 2022-11-16 NOTE — Telephone Encounter (Signed)
I returned call and spoke with Mr Rogas in Dr Kavin Leech absence. I reviewed recent CXR image. It looks mostly like chronic scarring. Recent WBC normal and he is not coughing up anything. I suggested he try using his Ventolin inhaler this weekend. Not anemia is gradually improving. On follow-up, decisions will be made about [facility] management. Dr Delton Coombes has Super D CT ordered.-CD Jabar Krysiak

## 2022-11-16 NOTE — Telephone Encounter (Signed)
Spoke with patients wife. She is wanting to Dr. Delton Coombes to look at patients chest xray from Tuesday. States pcp mentioned poss pneumonia. They are wanting a second opinion and see if patient needs treatment. Wife states he has been more short or breath and fatigued. They want to know if patient needs to be on medication.  Dr. Maple Hudson please advise since Dr. Delton Coombes is unavailable

## 2022-11-16 NOTE — Telephone Encounter (Signed)
David Buck states patient had an xray at [facility]. Would like Dr. Delton Coombes to review xray. David Buck phone number is [phone].

## 2022-11-20 DIAGNOSIS — R0602 Shortness of breath: Secondary | ICD-10-CM | POA: Diagnosis not present

## 2022-11-20 DIAGNOSIS — R262 Difficulty in walking, not elsewhere classified: Secondary | ICD-10-CM | POA: Diagnosis not present

## 2022-11-20 DIAGNOSIS — M6281 Muscle weakness (generalized): Secondary | ICD-10-CM | POA: Diagnosis not present

## 2022-11-20 NOTE — Telephone Encounter (Signed)
Thank you :)

## 2022-11-22 DIAGNOSIS — R0602 Shortness of breath: Secondary | ICD-10-CM | POA: Diagnosis not present

## 2022-11-22 DIAGNOSIS — R262 Difficulty in walking, not elsewhere classified: Secondary | ICD-10-CM | POA: Diagnosis not present

## 2022-11-22 DIAGNOSIS — M6281 Muscle weakness (generalized): Secondary | ICD-10-CM | POA: Diagnosis not present

## 2022-11-23 DIAGNOSIS — M6281 Muscle weakness (generalized): Secondary | ICD-10-CM | POA: Diagnosis not present

## 2022-11-23 DIAGNOSIS — R262 Difficulty in walking, not elsewhere classified: Secondary | ICD-10-CM | POA: Diagnosis not present

## 2022-11-23 DIAGNOSIS — R0602 Shortness of breath: Secondary | ICD-10-CM | POA: Diagnosis not present

## 2022-11-26 ENCOUNTER — Ambulatory Visit: Payer: Medicare Other | Admitting: Internal Medicine

## 2022-11-27 DIAGNOSIS — R0602 Shortness of breath: Secondary | ICD-10-CM | POA: Diagnosis not present

## 2022-11-27 DIAGNOSIS — M6281 Muscle weakness (generalized): Secondary | ICD-10-CM | POA: Diagnosis not present

## 2022-11-27 DIAGNOSIS — R262 Difficulty in walking, not elsewhere classified: Secondary | ICD-10-CM | POA: Diagnosis not present

## 2022-11-28 DIAGNOSIS — R262 Difficulty in walking, not elsewhere classified: Secondary | ICD-10-CM | POA: Diagnosis not present

## 2022-11-28 DIAGNOSIS — R0602 Shortness of breath: Secondary | ICD-10-CM | POA: Diagnosis not present

## 2022-11-28 DIAGNOSIS — M6281 Muscle weakness (generalized): Secondary | ICD-10-CM | POA: Diagnosis not present

## 2022-11-30 DIAGNOSIS — M6281 Muscle weakness (generalized): Secondary | ICD-10-CM | POA: Diagnosis not present

## 2022-11-30 DIAGNOSIS — R0602 Shortness of breath: Secondary | ICD-10-CM | POA: Diagnosis not present

## 2022-11-30 DIAGNOSIS — R262 Difficulty in walking, not elsewhere classified: Secondary | ICD-10-CM | POA: Diagnosis not present

## 2022-12-03 ENCOUNTER — Other Ambulatory Visit: Payer: Self-pay | Admitting: Thoracic Surgery (Cardiothoracic Vascular Surgery)

## 2022-12-03 DIAGNOSIS — R262 Difficulty in walking, not elsewhere classified: Secondary | ICD-10-CM | POA: Diagnosis not present

## 2022-12-03 DIAGNOSIS — R911 Solitary pulmonary nodule: Secondary | ICD-10-CM

## 2022-12-03 DIAGNOSIS — M6281 Muscle weakness (generalized): Secondary | ICD-10-CM | POA: Diagnosis not present

## 2022-12-03 DIAGNOSIS — R0602 Shortness of breath: Secondary | ICD-10-CM | POA: Diagnosis not present

## 2022-12-05 DIAGNOSIS — R262 Difficulty in walking, not elsewhere classified: Secondary | ICD-10-CM | POA: Diagnosis not present

## 2022-12-05 DIAGNOSIS — M6281 Muscle weakness (generalized): Secondary | ICD-10-CM | POA: Diagnosis not present

## 2022-12-05 DIAGNOSIS — R0602 Shortness of breath: Secondary | ICD-10-CM | POA: Diagnosis not present

## 2022-12-07 DIAGNOSIS — R0602 Shortness of breath: Secondary | ICD-10-CM | POA: Diagnosis not present

## 2022-12-07 DIAGNOSIS — R262 Difficulty in walking, not elsewhere classified: Secondary | ICD-10-CM | POA: Diagnosis not present

## 2022-12-07 DIAGNOSIS — M6281 Muscle weakness (generalized): Secondary | ICD-10-CM | POA: Diagnosis not present

## 2022-12-10 DIAGNOSIS — H4421 Degenerative myopia, right eye: Secondary | ICD-10-CM | POA: Diagnosis not present

## 2022-12-10 DIAGNOSIS — H353221 Exudative age-related macular degeneration, left eye, with active choroidal neovascularization: Secondary | ICD-10-CM | POA: Diagnosis not present

## 2022-12-10 DIAGNOSIS — H35373 Puckering of macula, bilateral: Secondary | ICD-10-CM | POA: Diagnosis not present

## 2022-12-10 DIAGNOSIS — H59812 Chorioretinal scars after surgery for detachment, left eye: Secondary | ICD-10-CM | POA: Diagnosis not present

## 2022-12-11 DIAGNOSIS — H353220 Exudative age-related macular degeneration, left eye, stage unspecified: Secondary | ICD-10-CM | POA: Diagnosis not present

## 2022-12-11 DIAGNOSIS — H02531 Eyelid retraction right upper eyelid: Secondary | ICD-10-CM | POA: Diagnosis not present

## 2022-12-11 DIAGNOSIS — E063 Autoimmune thyroiditis: Secondary | ICD-10-CM | POA: Diagnosis not present

## 2022-12-11 DIAGNOSIS — H532 Diplopia: Secondary | ICD-10-CM | POA: Diagnosis not present

## 2022-12-11 DIAGNOSIS — H02532 Eyelid retraction right lower eyelid: Secondary | ICD-10-CM | POA: Diagnosis not present

## 2022-12-12 ENCOUNTER — Encounter: Payer: Self-pay | Admitting: Internal Medicine

## 2022-12-12 ENCOUNTER — Ambulatory Visit: Payer: Medicare Other | Admitting: Internal Medicine

## 2022-12-12 VITALS — BP 102/70 | HR 62 | Temp 98.4°F | Ht 74.0 in | Wt 172.0 lb

## 2022-12-12 DIAGNOSIS — R0600 Dyspnea, unspecified: Secondary | ICD-10-CM | POA: Diagnosis not present

## 2022-12-12 DIAGNOSIS — J449 Chronic obstructive pulmonary disease, unspecified: Secondary | ICD-10-CM

## 2022-12-12 DIAGNOSIS — R9389 Abnormal findings on diagnostic imaging of other specified body structures: Secondary | ICD-10-CM

## 2022-12-12 MED ORDER — TADALAFIL 20 MG PO TABS: 20.0000 mg | ORAL_TABLET | Freq: Every day | ORAL | 3 refills | Status: AC | PRN

## 2022-12-12 MED ORDER — FLUTICASONE-SALMETEROL 100-50 MCG/ACT IN AEPB: 1.0000 | INHALATION_SPRAY | Freq: Two times a day (BID) | RESPIRATORY_TRACT | 3 refills | Status: DC

## 2022-12-12 NOTE — Assessment & Plan Note (Signed)
s/p robotic assisted right upper and lower lobe wedge resections in March 2024 by Dr Dorris Fetch.  Pathology showed granulomatous disease.  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

## 2022-12-12 NOTE — Progress Notes (Signed)
Subjective:  Patient ID: David Buck, male    DOB: [birth year]  Age: 82 y.o. MRN: [id number]  CC: Follow-up (6 mnth f/u)   HPI TYON LIBERT presents for s/p robotic assisted right upper and lower lobe wedge resections in March 2024 by Dr Dorris Fetch.  Pathology showed granulomatous disease.  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  C/o SOB, CP post-op  Outpatient Medications Prior to Visit  Medication Sig Dispense Refill   albuterol (VENTOLIN HFA) 108 (90 Base) MCG/ACT inhaler Inhale 2 puffs into the lungs every 4 (four) hours as needed for wheezing or shortness of breath. 8 g 3   ascorbic acid (VITAMIN C) 500 MG tablet Take 500 mg by mouth daily.     buPROPion (WELLBUTRIN XL) 150 MG 24 hr tablet Take 1 tablet (150 mg total) by mouth daily. 90 tablet 3   Cholecalciferol (VITAMIN D3) 50 MCG (2000 UT) TABS Take 2,000 Units by mouth every evening.     gabapentin (NEURONTIN) 100 MG capsule Take 1 capsule (100 mg total) by mouth 3 (three) times daily. Take one tablet at night for 3 days, then increase to twice daily, then if tolerated after 3 days increase to three times a day 90 capsule 3   GLUCOSAMINE HCL PO Take 1,000 mg by mouth daily.     loratadine (CLARITIN) 10 MG tablet Take 1 tablet by mouth daily as needed for allergies.     LORazepam (ATIVAN) 0.5 MG tablet TAKE ONE TABLET BY MOUTH TWICE A DAY AS NEEDED FOR ANXIETY 60 tablet 3   Multiple Vitamin (MULTIVITAMIN WITH MINERALS) TABS tablet Take 1 tablet by mouth every evening.     ondansetron (ZOFRAN) 4 MG tablet Take 1 tablet (4 mg total) by mouth every 8 (eight) hours as needed for nausea or vomiting. 20 tablet 0   pantoprazole (PROTONIX) 40 MG tablet Take 1 tablet (40 mg total) by mouth daily. (Patient taking differently: Take 40 mg by mouth daily as needed (heartburn).) 30 tablet 5   Polyethyl Glycol-Propyl Glycol (SYSTANE ULTRA) 0.4-0.3 % SOLN Place 1 drop into both eyes every evening.     ZINC PICOLINATE PO Take 22 mg by mouth  every evening.     No facility-administered medications prior to visit.    ROS: Review of Systems  Constitutional:  Negative for appetite change, fatigue and unexpected weight change.  HENT:  Negative for congestion, nosebleeds, sneezing, sore throat and trouble swallowing.   Eyes:  Negative for itching and visual disturbance.  Respiratory:  Positive for shortness of breath. Negative for cough.   Cardiovascular:  Negative for chest pain, palpitations and leg swelling.  Gastrointestinal:  Negative for abdominal distention, blood in stool, diarrhea and nausea.  Genitourinary:  Negative for frequency and hematuria.  Musculoskeletal:  Negative for back pain, gait problem, joint swelling and neck pain.  Skin:  Negative for rash.  Neurological:  Negative for dizziness, tremors, speech difficulty and weakness.  Psychiatric/Behavioral:  Negative for agitation, dysphoric mood and sleep disturbance. The patient is nervous/anxious.     Objective:  BP 102/70 (BP Location: Left Arm, Patient Position: Sitting, Cuff Size: Large)   Pulse 62   Temp 98.4 F (36.9 C) (Oral)   Ht 6\' 2"  (1.88 m)   Wt 172 lb (78 kg)   SpO2 94%   BMI 22.08 kg/m   BP Readings from Last 3 Encounters:  12/25/22 122/72  12/13/22 108/74  12/12/22 102/70    Wt Readings from Last 3 Encounters:  12/25/22  174 lb (78.9 kg)  12/13/22 172 lb 6.4 oz (78.2 kg)  12/12/22 172 lb (78 kg)    Physical Exam Constitutional:      General: He is not in acute distress.    Appearance: Normal appearance. He is well-developed.     Comments: NAD  Eyes:     Conjunctiva/sclera: Conjunctivae normal.     Pupils: Pupils are equal, round, and reactive to light.  Neck:     Thyroid: No thyromegaly.     Vascular: No JVD.  Cardiovascular:     Rate and Rhythm: Normal rate and regular rhythm.     Heart sounds: Normal heart sounds. No murmur heard.    No friction rub. No gallop.  Pulmonary:     Effort: Pulmonary effort is normal. No  respiratory distress.     Breath sounds: Normal breath sounds. No wheezing or rales.  Chest:     Chest wall: No tenderness.  Abdominal:     General: Bowel sounds are normal. There is no distension.     Palpations: Abdomen is soft. There is no mass.     Tenderness: There is no abdominal tenderness. There is no guarding or rebound.  Musculoskeletal:        General: No tenderness. Normal range of motion.     Cervical back: Normal range of motion.  Lymphadenopathy:     Cervical: No cervical adenopathy.  Skin:    General: Skin is warm and dry.     Findings: No rash.  Neurological:     Mental Status: He is alert and oriented to person, place, and time.     Cranial Nerves: No cranial nerve deficit.     Motor: No abnormal muscle tone.     Coordination: Coordination normal.     Gait: Gait normal.     Deep Tendon Reflexes: Reflexes are normal and symmetric.  Psychiatric:        Behavior: Behavior normal.        Thought Content: Thought content normal.        Judgment: Judgment normal.     Lab Results  Component Value Date   WBC 8.1 12/12/2022   HGB 12.2 (L) 12/12/2022   HCT 37.5 (L) 12/12/2022   PLT 273.0 12/12/2022   GLUCOSE 82 12/12/2022   CHOL 144 05/22/2022   TRIG 56.0 05/22/2022   HDL 65.00 05/22/2022   LDLCALC 68 05/22/2022   ALT 19 12/12/2022   AST 22 12/12/2022   NA 138 12/12/2022   K 4.5 12/12/2022   CL 104 12/12/2022   CREATININE 1.18 12/12/2022   BUN 25 (H) 12/12/2022   CO2 28 12/12/2022   TSH 4.58 05/22/2022   PSA 3.08 05/22/2022   INR 1.0 08/29/2022   HGBA1C 6.0 05/22/2022    DG Chest 2 View  Result Date: 11/13/2022 CLINICAL DATA:  Previous wedge resection for granulomatous disease, increasing shortness of breath EXAM: CHEST - 2 VIEW COMPARISON:  Previous studies including the examination done on 09/27/2022 FINDINGS: Cardiac size is within normal limits. Flattening of diaphragms suggesting COPD. There is asymmetric pleural thickening in the right apex.  Increased markings are seen in right perihilar region and right lower lung field. There is slight improvement in aeration in right lung. There is blunting of right lateral costophrenic angle. There is no pneumothorax. Left lung shows no new infiltrates or pulmonary edema. Left lateral CP angle is clear. IMPRESSION: COPD. Increased interstitial markings in right parahilar region and right lower lung fields suggest scarring or  interstitial pneumonia. Small right pleural effusion. Left lung is clear. Electronically Signed   By: Ernie Avena M.D.   On: 11/13/2022 14:44    Assessment & Plan:   Problem List Items Addressed This Visit     COPD mixed type [facility])    Trelegy sample      Abnormal chest CT - Primary     s/p robotic assisted right upper and lower lobe wedge resections in March 2024 by Dr Dorris Fetch.  Pathology showed granulomatous disease.  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       Other Visit Diagnoses     Dyspnea, unspecified type       Relevant Orders   CBC with Differential/Platelet (Completed)   Comprehensive metabolic panel (Completed)         Meds ordered this encounter  Medications   DISCONTD: fluticasone-salmeterol (ADVAIR) 100-50 MCG/ACT AEPB    Sig: Inhale 1 puff into the lungs 2 (two) times daily.    Dispense:  1 each    Refill:  3   tadalafil (CIALIS) 20 MG tablet    Sig: Take 1 tablet (20 mg total) by mouth daily as needed for erectile dysfunction.    Dispense:  20 tablet    Refill:  3      Follow-up: Return in about 4 months (around 04/13/2023) for a follow-up visit.  Sonda Primes, MD

## 2022-12-12 NOTE — Assessment & Plan Note (Signed)
Trelegy sample   

## 2022-12-13 ENCOUNTER — Ambulatory Visit (INDEPENDENT_AMBULATORY_CARE_PROVIDER_SITE_OTHER): Payer: Medicare Other | Admitting: Nurse Practitioner

## 2022-12-13 ENCOUNTER — Encounter: Payer: Self-pay | Admitting: Nurse Practitioner

## 2022-12-13 VITALS — BP 108/74 | HR 74 | Temp 97.8°F | Ht 74.0 in | Wt 172.4 lb

## 2022-12-13 DIAGNOSIS — R918 Other nonspecific abnormal finding of lung field: Secondary | ICD-10-CM

## 2022-12-13 DIAGNOSIS — A310 Pulmonary mycobacterial infection: Secondary | ICD-10-CM | POA: Diagnosis not present

## 2022-12-13 DIAGNOSIS — J302 Other seasonal allergic rhinitis: Secondary | ICD-10-CM | POA: Diagnosis not present

## 2022-12-13 DIAGNOSIS — J449 Chronic obstructive pulmonary disease, unspecified: Secondary | ICD-10-CM

## 2022-12-13 DIAGNOSIS — J3089 Other allergic rhinitis: Secondary | ICD-10-CM

## 2022-12-13 LAB — CBC WITH DIFFERENTIAL/PLATELET
Basophils Absolute: 0.1 10*3/uL (ref 0.0–0.1)
Basophils Relative: 0.7 % (ref 0.0–3.0)
Eosinophils Absolute: 0.3 10*3/uL (ref 0.0–0.7)
Eosinophils Relative: 4.3 % (ref 0.0–5.0)
HCT: 37.5 % — ABNORMAL LOW (ref 39.0–52.0)
Hemoglobin: 12.2 g/dL — ABNORMAL LOW (ref 13.0–17.0)
Lymphocytes Relative: 16.2 % (ref 12.0–46.0)
Lymphs Abs: 1.3 10*3/uL (ref 0.7–4.0)
MCHC: 32.5 g/dL (ref 30.0–36.0)
MCV: 91 fl (ref 78.0–100.0)
Monocytes Absolute: 0.7 10*3/uL (ref 0.1–1.0)
Monocytes Relative: 8.6 % (ref 3.0–12.0)
Neutro Abs: 5.7 10*3/uL (ref 1.4–7.7)
Neutrophils Relative %: 70.2 % (ref 43.0–77.0)
Platelets: 273 10*3/uL (ref 150.0–400.0)
RBC: 4.12 Mil/uL — ABNORMAL LOW (ref 4.22–5.81)
RDW: 15.1 % (ref 11.5–15.5)
WBC: 8.1 10*3/uL (ref 4.0–10.5)

## 2022-12-13 LAB — COMPREHENSIVE METABOLIC PANEL
ALT: 19 U/L (ref 0–53)
AST: 22 U/L (ref 0–37)
Albumin: 4.3 g/dL (ref 3.5–5.2)
Alkaline Phosphatase: 68 U/L (ref 39–117)
BUN: 25 mg/dL — ABNORMAL HIGH (ref 6–23)
CO2: 28 mEq/L (ref 19–32)
Calcium: 9.6 mg/dL (ref 8.4–10.5)
Chloride: 104 mEq/L (ref 96–112)
Creatinine, Ser: 1.18 mg/dL (ref 0.40–1.50)
GFR: 58.13 mL/min — ABNORMAL LOW (ref >60.00)
Glucose, Bld: 82 mg/dL (ref 70–99)
Potassium: 4.5 mEq/L (ref 3.5–5.1)
Sodium: 138 mEq/L (ref 135–145)
Total Bilirubin: 0.4 mg/dL (ref 0.2–1.2)
Total Protein: 7.3 g/dL (ref 6.0–8.3)

## 2022-12-13 MED ORDER — BEVESPI AEROSPHERE 9-4.8 MCG/ACT IN AERO: 2.0000 | INHALATION_SPRAY | Freq: Two times a day (BID) | RESPIRATORY_TRACT | 5 refills | Status: DC

## 2022-12-13 NOTE — Patient Instructions (Addendum)
Continue Albuterol inhaler 2 puffs every 6 hours as needed for shortness of breath or wheezing. Notify if symptoms persist despite rescue inhaler/neb use. Continue claritin 1 tab daily  Trial Bevespi 2 puffs Twice daily. This is your new maintenance inhaler and you will use it regardless of how you feel   Attend CT chest in August - someone will contact you for scheduling   Follow up in 8 weeks with Dr. Delton Coombes after your CT scan. Keep appointment with Dr. Maple Hudson. If symptoms do not improve or worsen, please contact office for sooner follow up or seek emergency care.

## 2022-12-13 NOTE — Progress Notes (Signed)
@Patient  ID: David Buck, male    DOB: Aug 22, 1940, 82 y.o.   MRN: [id number]  Chief Complaint  Patient presents with   Acute Visit    Resection of lung, pt states he is still feeling out of breath. Pt states he got blood work that is abnormal. Pt states he has not been using his inhaler and does not have advair    Referring provider: [name], Georgina Quint, MD  HPI: 82 year old male, former smoker followed for COPD and pulmonary nodular disease. 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 He is a patient of Dr. Kavin Leech and last seen 09/26/2022. Past medical history significant for Raynaud's Syndrome, HTN, TIA, OSA, GERD, hypothyroid.   TEST/EVENTS:  07/12/2022 PFT: FVC 82, FEV1 67, ratio 59, TLC 98, DLCOcor 62. Moderate obstructive lung disease without reversibility and moderate diffusing capacity.  08/08/2022 PET Super D CT: atherosclerosis. Cavitary spiculated nodule in RUL, interval decrease in cavitation. Lesion measures 15 mm, compared to 13 mm. Lesion retracts the adjacent major fissure. Peripheral tree-in-bud nodularity with diffuse cylindrical btx. Numerous b/l pulm nodules with 1 of the more dominant measuring 6 mgg in RLL, new. Plaque-like and nodular consolidative opacity in lung bases. Large cyst upper pole left kidney. 2nd exophytic lesion in interpolar left kidney, measures 2 cm.  08/08/2022 PET scan: dominant hypermetabolic spiculated solid pulmonary nodule RUL, slowly enlarged and suspicious. Mild hypermetabolism of scattered, small nodules. Findings most likely due to chronic atypical infection. No further evidence of metastatic disease. Indeterminate hyperdense exophytic renal lesions (identified as Bosniak I an II renal cysts later on CT abd)  09/26/2022: OV with Dr. Delton Coombes. February - spiculated 13 mm cavitary RUL pulm nodule. Referred to Dr. Dorris Fetch for robotic VATS resection 08/30/2022. Wedge resection showed fibroelastic scar with focal  acute and chronic inflammation without any overt granulomas and no evidence of carcinoma. Granulmoatous inflammation with some associated organizing pneumonia. AFB culture was positive at 2 week mark for myobacterium avium, negative for TB. Occasional SOB with exertion. Ambulating with a walker. Has seen some exertional desaturations to 88% when working with PT. Unclear if he needs to be treated at this time. Plan to do surveillance CT, follow his imaging, follow clinical status.   12/13/2022: Today - follow up/acute Patient presents today for follow up. He was listed as an acute but he tells me he doesn't necessarily feel any different than he did at his last visit. He just keeps having trouble with getting short winded with activity.  He can get dressed and complete ADLs without trouble but struggles with longer distances and outside walking. No cough, chest congestion, wheezing, orthopnea, leg swelling, fevers, chills, hemoptysis, night sweats. He is not currently on a maintenance inhaler. Rarely uses his rescue. His most recent labs were normal. Anemia has improved.   Allergies  Allergen Reactions   Cefdinir Rash   Cephalosporins Rash    Immunization History  Administered Date(s) Administered   BCG 04/29/2018   Fluad Quad(high Dose 65+) 04/10/2019, 04/05/2020   H1N1 04/25/2008   Influenza Split 05/07/2008, 04/18/2009, 04/04/2010, 04/10/2011, 04/02/2012   Influenza Whole 05/07/2008, 04/18/2009, 04/04/2010   Influenza, High Dose Seasonal PF 03/25/2016, 04/24/2016, 04/05/2017, 04/29/2018, 04/12/2021   Influenza, Seasonal, Injecte, Preservative Fre 04/02/2012   Influenza,inj,Quad PF,6+ Mos 03/20/2013, 04/09/2014, 04/18/2015   Influenza-Unspecified 03/25/2008, 03/25/2009, 03/26/2011, 04/10/2011, 04/02/2012, 03/20/2013, 04/09/2014, 04/18/2015, 04/24/2016, 04/05/2017, 04/05/2020, 04/12/2021, 05/01/2022   Moderna Sars-Covid-2 Vaccination 06/30/2019, 07/06/2019, 07/07/2019, 08/05/2019, 05/31/2020    PPD Test 04/29/2018  Pneumococcal Conjugate-13 05/15/2013   Pneumococcal Polysaccharide-23 07/23/2007, 04/26/2017   Pneumococcal-Unspecified 07/26/2006, 07/23/2007, 05/15/2013, 04/26/2017   Td 05/09/2009   Td (Adult),5 Lf Tetanus Toxid, Preservative Free 05/09/2009   Tdap 12/02/2017    Past Medical History:  Diagnosis Date   Allergy    rhinitis   Anxiety    COPD (chronic obstructive pulmonary disease) (HCC)    Essential tremor    Left Arm   Hepatitis    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   Hypertension    Hypothyroidism    Hx   LAFB (left anterior fascicular block) 2008   on EKG   Meniere's disease    OSA (obstructive sleep apnea) 01/25/2022   Osteoarthritis of right knee    Dr Eulah Pont   Pneumonia    2017, 2022   Posterior vitreous detachment 2011   R, body- Dr Allyne Gee   Prostatitis    Tinnitus     Tobacco History: Social History   Tobacco Use  Smoking Status Former   Packs/day: 1.00   Years: 50.00   Additional pack years: 0.00   Total pack years: 50.00   Types: Cigarettes   Start date: 55   Quit date: 01/17/2021   Years since quitting: 1.9  Smokeless Tobacco Never   Counseling given: Not Answered   Outpatient Medications Prior to Visit  Medication Sig Dispense Refill   albuterol (VENTOLIN HFA) 108 (90 Base) MCG/ACT inhaler Inhale 2 puffs into the lungs every 4 (four) hours as needed for wheezing or shortness of breath. 8 g 3   ascorbic acid (VITAMIN C) 500 MG tablet Take 500 mg by mouth daily.     buPROPion (WELLBUTRIN XL) 150 MG 24 hr tablet Take 1 tablet (150 mg total) by mouth daily. 90 tablet 3   Cholecalciferol (VITAMIN D3) 50 MCG (2000 UT) TABS Take 2,000 Units by mouth every evening.     faricimab-svoa (VABYSMO) 6 MG/0.05ML SOLN intravitreal injection 6 mg by Intravitreal route once.     gabapentin (NEURONTIN) 100 MG capsule Take 1 capsule (100 mg total) by mouth 3 (three) times daily. Take one tablet at night for 3 days, then increase to  twice daily, then if tolerated after 3 days increase to three times a day 90 capsule 3   GLUCOSAMINE HCL PO Take 1,000 mg by mouth daily.     loratadine (CLARITIN) 10 MG tablet Take 1 tablet by mouth daily as needed for allergies.     LORazepam (ATIVAN) 0.5 MG tablet TAKE ONE TABLET BY MOUTH TWICE A DAY AS NEEDED FOR ANXIETY 60 tablet 3   Multiple Vitamin (MULTIVITAMIN WITH MINERALS) TABS tablet Take 1 tablet by mouth every evening.     ondansetron (ZOFRAN) 4 MG tablet Take 1 tablet (4 mg total) by mouth every 8 (eight) hours as needed for nausea or vomiting. 20 tablet 0   pantoprazole (PROTONIX) 40 MG tablet Take 1 tablet (40 mg total) by mouth daily. (Patient taking differently: Take 40 mg by mouth daily as needed (heartburn).) 30 tablet 5   Polyethyl Glycol-Propyl Glycol (SYSTANE ULTRA) 0.4-0.3 % SOLN Place 1 drop into both eyes every evening.     tadalafil (CIALIS) 20 MG tablet Take 1 tablet (20 mg total) by mouth daily as needed for erectile dysfunction. 20 tablet 3   ZINC PICOLINATE PO Take 22 mg by mouth every evening.     fluticasone-salmeterol (ADVAIR) 100-50 MCG/ACT AEPB Inhale 1 puff into the lungs 2 (two) times daily. 1 each 3  No facility-administered medications prior to visit.     Review of Systems:   Constitutional: No weight loss or gain, night sweats, fevers, chills, fatigue, or lassitude. HEENT: No headaches, difficulty swallowing, tooth/dental problems, or sore throat. No sneezing, itching, ear ache, nasal congestion, or post nasal drip CV:  No chest pain, orthopnea, PND, swelling in lower extremities, anasarca, dizziness, palpitations, syncope Resp: +shortness of breath with exertion. No excess mucus or change in color of mucus. No productive or non-productive. No hemoptysis. No wheezing.  No chest wall deformity GI:  No heartburn, indigestion, abdominal pain, nausea, vomiting, diarrhea, change in bowel habits, loss of appetite, bloody stools.  GU: No dysuria, change in  color of urine, urgency or frequency. Skin: No rash, lesions, ulcerations MSK:  No joint pain or swelling.   Neuro: No dizziness or lightheadedness.  Psych: No depression or anxiety. Mood stable.     Physical Exam:  BP 108/74   Pulse 74   Temp 97.8 F (36.6 C) (Oral)   Ht 6\' 2"  (1.88 m)   Wt 172 lb 6.4 oz (78.2 kg)   SpO2 96%   BMI 22.13 kg/m   GEN: Pleasant, interactive, well-kempt; in no acute distress. HEENT:  Normocephalic and atraumatic. PERRLA. Sclera white. Nasal turbinates pink, moist and patent bilaterally. No rhinorrhea present. Oropharynx pink and moist, without exudate or edema. No lesions, ulcerations, or postnasal drip.  NECK:  Supple w/ fair ROM. No JVD present. Normal carotid impulses w/o bruits. Thyroid symmetrical with no goiter or nodules palpated. No lymphadenopathy.   CV: RRR, no m/r/g, no peripheral edema. Pulses intact, +2 bilaterally. No cyanosis, pallor or clubbing. PULMONARY:  Unlabored, regular breathing. Diminished bilaterally A&P w/o wheezes/rales/rhonchi. No accessory muscle use.  GI: BS present and normoactive. Soft, non-tender to palpation. No organomegaly or masses detected.  MSK: No erythema, warmth or tenderness. Cap refil <2 sec all extrem. No deformities or joint swelling noted.  Neuro: A/Ox3. No focal deficits noted.   Skin: Warm, no lesions or rashe Psych: Normal affect and behavior. Judgement and thought content appropriate.     Lab Results:  CBC    Component Value Date/Time   WBC 8.1 12/12/2022 1640   RBC 4.12 (L) 12/12/2022 1640   HGB 12.2 (L) 12/12/2022 1640   HCT 37.5 (L) 12/12/2022 1640   PLT 273.0 12/12/2022 1640   MCV 91.0 12/12/2022 1640   MCH 29.5 11/13/2022 1606   MCHC 32.5 12/12/2022 1640   RDW 15.1 12/12/2022 1640   LYMPHSABS 1.3 12/12/2022 1640   MONOABS 0.7 12/12/2022 1640   EOSABS 0.3 12/12/2022 1640   BASOSABS 0.1 12/12/2022 1640    BMET    Component Value Date/Time   NA 138 12/12/2022 1640   K 4.5  12/12/2022 1640   CL 104 12/12/2022 1640   CO2 28 12/12/2022 1640   GLUCOSE 82 12/12/2022 1640   BUN 25 (H) 12/12/2022 1640   CREATININE 1.18 12/12/2022 1640   CREATININE 0.89 02/09/2020 1620   CALCIUM 9.6 12/12/2022 1640   GFRNONAA >60 09/04/2022 0009   GFRNONAA 82 02/09/2020 1620   GFRAA >60 03/23/2020 2357   GFRAA 95 02/09/2020 1620    BNP No results found for: "BNP"   Imaging:  No results found.       Latest Ref Rng & Units 07/12/2022    3:47 PM  PFT Results  FVC-Pre L 3.90   FVC-Predicted Pre % 82   FVC-Post L 4.16   FVC-Predicted Post % 88   Pre  FEV1/FVC % % 59   Post FEV1/FCV % % 59   FEV1-Pre L 2.29   FEV1-Predicted Pre % 67   FEV1-Post L 2.45   DLCO uncorrected ml/min/mmHg 17.20   DLCO UNC% % 62   DLCO corrected ml/min/mmHg 17.20   DLCO COR %Predicted % 62   DLVA Predicted % 70   TLC L 7.76   TLC % Predicted % 98   RV % Predicted % 129     No results found for: "NITRICOXIDE"      Assessment & Plan:   COPD mixed type (HCC) Moderate obstructive lung disease, currently not on maintenance. Would recommend avoiding ICS given btx and MAI. We will trial him on LABA/LAMA therapy and see how he responds. If no improvement in DOE, may consider further workup. He does not have any infectious symptoms so would not proceed with treatment of his MAI at this point. Action plan in place. May consider referral to pulmonary rehab.   Patient Instructions  Continue Albuterol inhaler 2 puffs every 6 hours as needed for shortness of breath or wheezing. Notify if symptoms persist despite rescue inhaler/neb use. Continue claritin 1 tab daily  Trial Bevespi 2 puffs Twice daily. This is your new maintenance inhaler and you will use it regardless of how you feel   Attend CT chest in August - someone will contact you for scheduling   Follow up in 8 weeks with Dr. Delton Coombes after your CT scan. Keep appointment with Dr. Maple Hudson. If symptoms do not improve or worsen, please  contact office for sooner follow up or seek emergency care.    MAI (mycobacterium avium-intracellulare) infection (HCC) See above. Follow up CT scheduled for August. Will continue with surveillance.   Seasonal and perennial allergic rhinitis Stable   Pulmonary nodules See above    I spent 35 minutes of dedicated to the care of this patient on the date of this encounter to include pre-visit review of records, face-to-face time with the patient discussing conditions above, post visit ordering of testing, clinical documentation with the electronic health record, making appropriate referrals as documented, and communicating necessary findings to members of the patients care team.  Noemi Chapel, NP 12/19/2022  Pt aware and understands NP's role.

## 2022-12-14 ENCOUNTER — Telehealth: Payer: Self-pay | Admitting: Nurse Practitioner

## 2022-12-14 DIAGNOSIS — R0602 Shortness of breath: Secondary | ICD-10-CM | POA: Diagnosis not present

## 2022-12-14 DIAGNOSIS — R262 Difficulty in walking, not elsewhere classified: Secondary | ICD-10-CM | POA: Diagnosis not present

## 2022-12-14 DIAGNOSIS — M6281 Muscle weakness (generalized): Secondary | ICD-10-CM | POA: Diagnosis not present

## 2022-12-14 NOTE — Telephone Encounter (Signed)
Can you run a ticket on patients insurance to see what else is covered?  Please route message back to triage

## 2022-12-14 NOTE — Telephone Encounter (Signed)
Patient states David Buck is too expensive at pharmacy. Pharmacy is [facility]. Patient phone number is [phone].

## 2022-12-17 ENCOUNTER — Other Ambulatory Visit (HOSPITAL_COMMUNITY): Payer: Self-pay

## 2022-12-17 DIAGNOSIS — R262 Difficulty in walking, not elsewhere classified: Secondary | ICD-10-CM | POA: Diagnosis not present

## 2022-12-17 DIAGNOSIS — R0602 Shortness of breath: Secondary | ICD-10-CM | POA: Diagnosis not present

## 2022-12-17 DIAGNOSIS — M6281 Muscle weakness (generalized): Secondary | ICD-10-CM | POA: Diagnosis not present

## 2022-12-17 NOTE — Telephone Encounter (Signed)
Patient has deductible to meet, alternative also covered in same class is Anoro with co-pay being $419.95.

## 2022-12-19 ENCOUNTER — Encounter: Payer: Self-pay | Admitting: Nurse Practitioner

## 2022-12-19 DIAGNOSIS — A310 Pulmonary mycobacterial infection: Secondary | ICD-10-CM | POA: Insufficient documentation

## 2022-12-19 NOTE — Assessment & Plan Note (Signed)
See above

## 2022-12-19 NOTE — Assessment & Plan Note (Signed)
Moderate obstructive lung disease, currently not on maintenance. Would recommend avoiding ICS given btx and MAI. We will trial him on LABA/LAMA therapy and see how he responds. If no improvement in DOE, may consider further workup. He does not have any infectious symptoms so would not proceed with treatment of his MAI at this point. Action plan in place. May consider referral to pulmonary rehab.   Patient Instructions  Continue Albuterol inhaler 2 puffs every 6 hours as needed for shortness of breath or wheezing. Notify if symptoms persist despite rescue inhaler/neb use. Continue claritin 1 tab daily  Trial Bevespi 2 puffs Twice daily. This is your new maintenance inhaler and you will use it regardless of how you feel   Attend CT chest in August - someone will contact you for scheduling   Follow up in 8 weeks with Dr. Delton Coombes after your CT scan. Keep appointment with Dr. Maple Hudson. If symptoms do not improve or worsen, please contact office for sooner follow up or seek emergency care.

## 2022-12-19 NOTE — Assessment & Plan Note (Signed)
See above. Follow up CT scheduled for August. Will continue with surveillance.

## 2022-12-19 NOTE — Assessment & Plan Note (Signed)
Stable

## 2022-12-19 NOTE — Telephone Encounter (Signed)
Message routed to Katie,NP to advise 

## 2022-12-20 DIAGNOSIS — R0602 Shortness of breath: Secondary | ICD-10-CM | POA: Diagnosis not present

## 2022-12-20 DIAGNOSIS — M6281 Muscle weakness (generalized): Secondary | ICD-10-CM | POA: Diagnosis not present

## 2022-12-20 DIAGNOSIS — R262 Difficulty in walking, not elsewhere classified: Secondary | ICD-10-CM | POA: Diagnosis not present

## 2022-12-24 DIAGNOSIS — R0602 Shortness of breath: Secondary | ICD-10-CM | POA: Diagnosis not present

## 2022-12-24 DIAGNOSIS — R262 Difficulty in walking, not elsewhere classified: Secondary | ICD-10-CM | POA: Diagnosis not present

## 2022-12-24 DIAGNOSIS — M6281 Muscle weakness (generalized): Secondary | ICD-10-CM | POA: Diagnosis not present

## 2022-12-25 ENCOUNTER — Encounter: Payer: Self-pay | Admitting: Thoracic Surgery (Cardiothoracic Vascular Surgery)

## 2022-12-25 ENCOUNTER — Ambulatory Visit
Admission: RE | Admit: 2022-12-25 | Discharge: 2022-12-25 | Disposition: A | Discharge status: Home / Self Care | Payer: Medicare Other | Source: Ambulatory Visit | Attending: Thoracic Surgery (Cardiothoracic Vascular Surgery) | Admitting: Thoracic Surgery (Cardiothoracic Vascular Surgery)

## 2022-12-25 ENCOUNTER — Ambulatory Visit (INDEPENDENT_AMBULATORY_CARE_PROVIDER_SITE_OTHER): Payer: Medicare Other | Admitting: Thoracic Surgery (Cardiothoracic Vascular Surgery)

## 2022-12-25 VITALS — BP 122/72 | HR 72 | Resp 20 | Ht 74.0 in | Wt 174.0 lb

## 2022-12-25 DIAGNOSIS — R911 Solitary pulmonary nodule: Secondary | ICD-10-CM

## 2022-12-25 DIAGNOSIS — Z902 Acquired absence of lung [part of]: Secondary | ICD-10-CM | POA: Diagnosis not present

## 2022-12-25 NOTE — Telephone Encounter (Signed)
Called and spoke with pt, pt verbalized understanding. NFN

## 2022-12-25 NOTE — Progress Notes (Signed)
[address]       [place] [address]             [phone]     HPI: Mr. Thaut returns for a scheduled follow-up visit  Joshau "Aurther Loft" Lozier is an 82 year old man with a history of tobacco abuse, COPD, MAC, hypertension, hypothyroidism, left anterior fascicular block, pneumonia, vertigo, syncope, aortic and coronary atherosclerosis, thyroid orbitopathy, reflux, and obstructive sleep apnea.  He was found to have a suspicious nodule on a CT of the chest.  It was hypermetabolic on PET/CT.  I did a robotic assisted right upper and lower lobe wedge resections in March 2024.  Pathology showed granulomatous disease and cultures were positive for MAC.  Postoperative course was complicated by bleeding initially and then air leak.  I last saw him in the office on 11/13/2022.  His pain did improve significantly.  He has been working with physical therapy.  He said he desaturates when he walks up an incline.  Still feels short of breath.  Was prescribed a couple of different inhalers by the pulmonary service but was not able to afford those.  He cut back on his gabapentin from 100 mg 3 times daily to 100 mg twice daily about 2 weeks ago.  He has noticed some pain in the right costal margin area since then.  Not sure whether he should go back up on his dose or not.  Typically a relatively fleeting pain that only lasts a few seconds. 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 8 g 3   ascorbic acid (VITAMIN C) 500 MG tablet Take 500 mg by mouth daily.     buPROPion (WELLBUTRIN XL) 150 MG 24 hr tablet Take 1 tablet (150 mg total) by mouth daily. 90 tablet 3   Cholecalciferol (VITAMIN D3) 50 MCG (2000 UT) TABS Take 2,000 Units by mouth every evening.     faricimab-svoa (VABYSMO) 6 MG/0.05ML SOLN intravitreal injection 6 mg by Intravitreal route once.     gabapentin (NEURONTIN) 100 MG capsule Take 1 capsule (100 mg total) by mouth 3 (three) times daily. Take one tablet at night for 3 days, then increase to twice daily, then if tolerated after 3 days increase to three times a day 90 capsule 3   GLUCOSAMINE HCL PO Take 1,000 mg by mouth daily.     loratadine (CLARITIN) 10 MG tablet Take 1 tablet by mouth daily as needed for allergies.     LORazepam (ATIVAN) 0.5 MG tablet TAKE ONE TABLET BY MOUTH TWICE A DAY AS NEEDED FOR ANXIETY  60 tablet 3   Multiple Vitamin (MULTIVITAMIN WITH MINERALS) TABS tablet Take 1 tablet by mouth every evening.     ondansetron (ZOFRAN) 4 MG tablet Take 1 tablet (4 mg total) by mouth every 8 (eight) hours as needed for nausea or vomiting. 20 tablet 0   pantoprazole (PROTONIX) 40 MG tablet Take 1 tablet (40 mg total) by mouth daily. (Patient taking differently: Take 40 mg by mouth daily as needed (heartburn).) 30 tablet 5   Polyethyl Glycol-Propyl Glycol (SYSTANE ULTRA) 0.4-0.3 % SOLN Place 1 drop into both eyes every evening.     tadalafil (CIALIS) 20 MG tablet Take 1 tablet (20 mg total) by mouth daily as needed for erectile dysfunction. 20 tablet 3   ZINC PICOLINATE PO Take 22 mg by mouth every evening.     Glycopyrrolate-Formoterol (BEVESPI AEROSPHERE) 9-4.8 MCG/ACT AERO Inhale 2 puffs into the lungs 2 (two) times daily. 10.7 g 5   No  current facility-administered medications for this visit.    Physical Exam BP 122/72   Pulse 72   Resp 20   Ht 6\' 2"  (1.88 m)   Wt 174 lb (78.9 kg)   SpO2 94% Comment: RA  BMI 22.47 kg/m  82 year old man in no acute distress Alert and oriented x 3 with no focal deficits Lungs diminished breath sounds bilaterally with no wheezing Cardiac regular rate and rhythm No peripheral edema  Diagnostic Tests: I personally reviewed the chest x-ray images.  No significant change from his previous film in May.  Impression: Burnard "Aurther Loft" Midura is an 82 year old man with a history of tobacco abuse, COPD, MAC, hypertension, hypothyroidism, left anterior fascicular block, pneumonia, vertigo, syncope, aortic and coronary atherosclerosis, thyroid orbitopathy, reflux, and obstructive sleep apnea.    Robotic assisted wedge resections-still has some pain and unusual sensations in the area of his right costal margin.  Is on gabapentin twice daily currently.  I advised him to stay with that dose for a couple more weeks and see if anything changes.  If his pain worsens he can go up to 3 times a day.  Would not try to decrease at this point.  Dyspnea-has made some progress with his dyspnea with exertion but was noted to desaturate when he was walking up an incline.  He has been prescribed a couple different inhalers but was unable to afford those.  Still working with pulmonology on that.  He has an appointment with Dr. Delton Coombes in August and will have a CT at that time.  I will plan to see him back sometime after that to review the images as well.  Plan: Follow-up with Dr. Delton Coombes as scheduled Return in 2 months after CT is done.  Loreli Slot, MD [facility] [phone]

## 2022-12-25 NOTE — Telephone Encounter (Signed)
Please contact patient and advise him to contact his insurance. It looks like he has to meet a deductible before they will cover his inhalers.

## 2022-12-26 DIAGNOSIS — M6281 Muscle weakness (generalized): Secondary | ICD-10-CM | POA: Diagnosis not present

## 2022-12-26 DIAGNOSIS — R0602 Shortness of breath: Secondary | ICD-10-CM | POA: Diagnosis not present

## 2022-12-26 DIAGNOSIS — R262 Difficulty in walking, not elsewhere classified: Secondary | ICD-10-CM | POA: Diagnosis not present

## 2022-12-31 DIAGNOSIS — R262 Difficulty in walking, not elsewhere classified: Secondary | ICD-10-CM | POA: Diagnosis not present

## 2022-12-31 DIAGNOSIS — R0602 Shortness of breath: Secondary | ICD-10-CM | POA: Diagnosis not present

## 2022-12-31 DIAGNOSIS — M6281 Muscle weakness (generalized): Secondary | ICD-10-CM | POA: Diagnosis not present

## 2023-01-03 DIAGNOSIS — M6281 Muscle weakness (generalized): Secondary | ICD-10-CM | POA: Diagnosis not present

## 2023-01-03 DIAGNOSIS — R0602 Shortness of breath: Secondary | ICD-10-CM | POA: Diagnosis not present

## 2023-01-03 DIAGNOSIS — R262 Difficulty in walking, not elsewhere classified: Secondary | ICD-10-CM | POA: Diagnosis not present

## 2023-01-07 DIAGNOSIS — R262 Difficulty in walking, not elsewhere classified: Secondary | ICD-10-CM | POA: Diagnosis not present

## 2023-01-07 DIAGNOSIS — M6281 Muscle weakness (generalized): Secondary | ICD-10-CM | POA: Diagnosis not present

## 2023-01-07 DIAGNOSIS — R0602 Shortness of breath: Secondary | ICD-10-CM | POA: Diagnosis not present

## 2023-01-09 DIAGNOSIS — R0602 Shortness of breath: Secondary | ICD-10-CM | POA: Diagnosis not present

## 2023-01-09 DIAGNOSIS — H353221 Exudative age-related macular degeneration, left eye, with active choroidal neovascularization: Secondary | ICD-10-CM | POA: Diagnosis not present

## 2023-01-09 DIAGNOSIS — R262 Difficulty in walking, not elsewhere classified: Secondary | ICD-10-CM | POA: Diagnosis not present

## 2023-01-09 DIAGNOSIS — M6281 Muscle weakness (generalized): Secondary | ICD-10-CM | POA: Diagnosis not present

## 2023-01-14 ENCOUNTER — Other Ambulatory Visit: Payer: Self-pay | Admitting: Thoracic Surgery (Cardiothoracic Vascular Surgery)

## 2023-01-14 ENCOUNTER — Encounter: Payer: Self-pay | Admitting: Internal Medicine

## 2023-01-14 DIAGNOSIS — M65331 Trigger finger, right middle finger: Secondary | ICD-10-CM | POA: Diagnosis not present

## 2023-01-14 DIAGNOSIS — M65341 Trigger finger, right ring finger: Secondary | ICD-10-CM | POA: Diagnosis not present

## 2023-01-14 DIAGNOSIS — M13841 Other specified arthritis, right hand: Secondary | ICD-10-CM | POA: Diagnosis not present

## 2023-01-21 DIAGNOSIS — H353111 Nonexudative age-related macular degeneration, right eye, early dry stage: Secondary | ICD-10-CM | POA: Diagnosis not present

## 2023-01-21 DIAGNOSIS — H35722 Serous detachment of retinal pigment epithelium, left eye: Secondary | ICD-10-CM | POA: Diagnosis not present

## 2023-01-21 DIAGNOSIS — H353221 Exudative age-related macular degeneration, left eye, with active choroidal neovascularization: Secondary | ICD-10-CM | POA: Diagnosis not present

## 2023-01-21 DIAGNOSIS — H35373 Puckering of macula, bilateral: Secondary | ICD-10-CM | POA: Diagnosis not present

## 2023-01-21 DIAGNOSIS — H40013 Open angle with borderline findings, low risk, bilateral: Secondary | ICD-10-CM | POA: Diagnosis not present

## 2023-01-30 ENCOUNTER — Ambulatory Visit
Admission: RE | Admit: 2023-01-30 | Discharge: 2023-01-30 | Disposition: A | Discharge status: Home / Self Care | Payer: Medicare Other | Source: Ambulatory Visit | Attending: Emergency Medicine | Admitting: Emergency Medicine

## 2023-01-30 DIAGNOSIS — R918 Other nonspecific abnormal finding of lung field: Secondary | ICD-10-CM

## 2023-01-30 DIAGNOSIS — J439 Emphysema, unspecified: Secondary | ICD-10-CM | POA: Diagnosis not present

## 2023-01-30 DIAGNOSIS — I700 Atherosclerosis of aorta: Secondary | ICD-10-CM | POA: Diagnosis not present

## 2023-01-31 ENCOUNTER — Other Ambulatory Visit: Payer: Self-pay | Admitting: Internal Medicine

## 2023-01-31 DIAGNOSIS — L218 Other seborrheic dermatitis: Secondary | ICD-10-CM | POA: Diagnosis not present

## 2023-01-31 DIAGNOSIS — L738 Other specified follicular disorders: Secondary | ICD-10-CM | POA: Diagnosis not present

## 2023-02-08 ENCOUNTER — Ambulatory Visit: Payer: Medicare Other | Admitting: Emergency Medicine

## 2023-02-08 ENCOUNTER — Encounter: Payer: Self-pay | Admitting: Emergency Medicine

## 2023-02-08 VITALS — BP 128/70 | HR 70 | Ht 74.0 in | Wt 179.6 lb

## 2023-02-08 DIAGNOSIS — J449 Chronic obstructive pulmonary disease, unspecified: Secondary | ICD-10-CM | POA: Diagnosis not present

## 2023-02-08 DIAGNOSIS — H353221 Exudative age-related macular degeneration, left eye, with active choroidal neovascularization: Secondary | ICD-10-CM | POA: Diagnosis not present

## 2023-02-08 DIAGNOSIS — R918 Other nonspecific abnormal finding of lung field: Secondary | ICD-10-CM

## 2023-02-08 NOTE — Patient Instructions (Signed)
We reviewed your CT scan today.  You can determine with Dr. Maple Hudson the timing of any subsequent scans depending on how you are feeling. Agree with going ahead and starting Bevespi 2 puffs twice a day. Follow with Dr. Maple Hudson as planned

## 2023-02-08 NOTE — Assessment & Plan Note (Signed)
Resection by Dr. Dorris Fetch consistent with Mycobacterium avium.  His repeat CT chest 01/30/2023 does not show any evidence of active disease or any significant change.  Not sure that he needs to have dedicated follow-up but certainly if he had a clinical change then repeat CT chest would be helpful.  He plans to follow-up with Dr. Maple Hudson to discuss.

## 2023-02-08 NOTE — Progress Notes (Signed)
Subjective:    Patient ID: David Buck, male    DOB: 1940/11/05, 82 y.o.   MRN: [id number]   HPI  ROV 02/08/2023 --82 year old man with a history of COPD.  He had pulmonary nodules on CT scan of the chest 1 of which was spiculated and was removed by VATS resection 08/30/2022.  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  No obvious infectious symptoms.  He was seen in June, attempted to get him back on LABA/LAMA.  He reports that he had a deductible, hasn't started anything yet. He is still getting breathless. SOB w exertion, walking the dog. Does not cough or have a mucous burden.   CT scan of the chest 01/30/2023 reviewed by me shows biapical pleural-parenchymal scar, emphysematous change, postoperative scarring in the right upper and lower lobes with some scattered mucoid impaction and scattered tiny pulmonary nodules all 4 mm or less in size.   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   Hypertension    Hypothyroidism    Hx   LAFB (left anterior fascicular block) 2008   on EKG   Meniere's disease    OSA (obstructive sleep apnea) 01/25/2022   Osteoarthritis of right knee    Dr Eulah Pont   Pneumonia    2017, 2022   Posterior vitreous detachment 2011   R, body- Dr Allyne Gee   Prostatitis    Tinnitus      Family History  Problem Relation Age of Onset   Cancer Mother        ovarian and bowel   Other Father        cerebral hemorrage   Hypertension Other      Social History   Socioeconomic History   Marital status: Married    Spouse name: Not on file   Number of children: Not on file   Years of education: Not on file   Highest education level: Bachelor's degree (e.g., BA, AB, BS)  Occupational History   Not on file  Tobacco Use   Smoking  status: Former    Current packs/day: 0.00    Average packs/day: 1 pack/day for 55.6 years (55.6 ttl pk-yrs)    Types: Cigarettes    Start date: 60    Quit date: 01/17/2021    Years since quitting: 2.0   Smokeless tobacco: Never  Vaping Use   Vaping status: Never Used  Substance and Sexual Activity   Alcohol use: Yes    Comment: rarely   Drug use: No   Sexual activity: Yes  Other Topics Concern   Not on file  Social History Narrative   Not on file   Social Determinants of Health   Financial Resource Strain: Low Risk  (12/08/2022)   Overall Financial Resource Strain (CARDIA)    Difficulty of Paying Living Expenses: Not very hard  Food Insecurity: No Food Insecurity (12/08/2022)   Hunger Vital Sign    Worried About Running Out of Food in the Last Year: Never true    Ran Out of Food in the Last Year: Never true  Transportation Needs: No Transportation Needs (12/08/2022)   PRAPARE - Transportation    Lack of Transportation (Medical): No    Lack  of Transportation (Non-Medical): No  Physical Activity: Sufficiently Active (12/08/2022)   Exercise Vital Sign    Days of Exercise per Week: 3 days    Minutes of Exercise per Session: 50 min  Stress: No Stress Concern Present (12/08/2022)   [employer] of Occupational Health - Occupational Stress Questionnaire    Feeling of Stress : Only a little  Social Connections: Moderately Isolated (12/08/2022)   Social Connection and Isolation Panel [NHANES]    Frequency of Communication with Friends and Family: Twice a week    Frequency of Social Gatherings with Friends and Family: Twice a week    Attends Religious Services: Never    Database administrator or Organizations: No    Attends Banker Meetings: Never    Marital Status: Married  Catering manager Violence: Not At Risk (06/28/2022)   Humiliation, Afraid, Rape, and Kick questionnaire    Fear of Current or Ex-Partner: No    Emotionally Abused: No    Physically Abused: No     Sexually Abused: No     Allergies  Allergen Reactions   Cefdinir Rash   Cephalosporins Rash     Outpatient Medications Prior to Visit  Medication Sig Dispense Refill   ascorbic acid (VITAMIN C) 500 MG tablet Take 500 mg by mouth daily.     buPROPion (WELLBUTRIN XL) 150 MG 24 hr tablet Take 1 tablet (150 mg total) by mouth daily. Annual appt due in Sept must see provider for future refills 90 tablet 0   Cholecalciferol (VITAMIN D3) 50 MCG (2000 UT) TABS Take 2,000 Units by mouth every evening.     faricimab-svoa (VABYSMO) 6 MG/0.05ML SOLN intravitreal injection 6 mg by Intravitreal route every 30 (thirty) days. Once every 4 weeks.     gabapentin (NEURONTIN) 100 MG capsule TAKE ONE CAPSULE BY MOUTH NIGHTLY FOR 3 NIGHTS, THEN INCREASE TO ONE CAPSULE TWICE DAILY FOR 3 DAYS, THEN IF TOLERATED, INCREASE TO ONE CAPSULE THREE TIMES DAILY THEREAFTER 90 capsule 3   GLUCOSAMINE HCL PO Take 1,000 mg by mouth daily.     loratadine (CLARITIN) 10 MG tablet Take 1 tablet by mouth daily as needed for allergies.     LORazepam (ATIVAN) 0.5 MG tablet TAKE ONE TABLET BY MOUTH TWICE A DAY AS NEEDED FOR ANXIETY 60 tablet 3   Multiple Vitamin (MULTIVITAMIN WITH MINERALS) TABS tablet Take 1 tablet by mouth every evening.     ondansetron (ZOFRAN) 4 MG tablet Take 1 tablet (4 mg total) by mouth every 8 (eight) hours as needed for nausea or vomiting. 20 tablet 0   Polyethyl Glycol-Propyl Glycol (SYSTANE ULTRA) 0.4-0.3 % SOLN Place 1 drop into both eyes every evening.     ZINC PICOLINATE PO Take 22 mg by mouth every evening.     albuterol (VENTOLIN HFA) 108 (90 Base) MCG/ACT inhaler Inhale 2 puffs into the lungs every 4 (four) hours as needed for wheezing or shortness of breath. (Patient not taking: Reported on 02/08/2023) 8 g 3   tadalafil (CIALIS) 20 MG tablet Take 1 tablet (20 mg total) by mouth daily as needed for erectile dysfunction. 20 tablet 3   Glycopyrrolate-Formoterol (BEVESPI AEROSPHERE) 9-4.8 MCG/ACT  AERO Inhale 2 puffs into the lungs 2 (two) times daily. 10.7 g 5   pantoprazole (PROTONIX) 40 MG tablet Take 1 tablet (40 mg total) by mouth daily. (Patient taking differently: Take 40 mg by mouth daily as needed (heartburn).) 30 tablet 5   No facility-administered medications prior to visit.  Objective:   Physical Exam Vitals:   02/08/23 1527  BP: 128/70  Pulse: 70  SpO2: 97%  Weight: 179 lb 9.6 oz (81.5 kg)  Height: 6\' 2"  (1.88 m)   Gen: Pleasant, thin man, in no distress,  normal affect  ENT: No lesions,  mouth clear,  oropharynx clear, no postnasal drip  Neck: No JVD, no stridor  Lungs: No use of accessory muscles, no crackles or wheezing on normal respiration, no wheeze on forced expiration  Cardiovascular: RRR, heart sounds normal, no murmur or gallops, no peripheral edema  Musculoskeletal: No deformities, no cyanosis or clubbing  Neuro: alert, awake, non focal  Skin: Warm, no lesions or rash      Assessment & Plan:  Pulmonary nodules Resection by Dr. Dorris Fetch consistent with Mycobacterium avium.  His repeat CT chest 01/30/2023 does not show any evidence of active disease or any significant change.  Not sure that he needs to have dedicated follow-up but certainly if he had a clinical change then repeat CT chest would be helpful.  He plans to follow-up with Dr. Maple Hudson to discuss.  COPD mixed type (HCC) Significant obstruction on his pulmonary function testing with exertional shortness of breath.  Agree that he should be on bronchodilator therapy.  1 barrier was that he had a high co-pay on his insurance.  He is going to fill the Bevespi, try it and see if he gets clinical benefit.    Levy Pupa, MD, PhD 02/08/2023, 3:53 PM [facility] [phone] or if no answer before 7:00PM call [phone] For any issues after 7:00PM please call eLink [phone]

## 2023-02-08 NOTE — Assessment & Plan Note (Signed)
Significant obstruction on his pulmonary function testing with exertional shortness of breath.  Agree that he should be on bronchodilator therapy.  1 barrier was that he had a high co-pay on his insurance.  He is going to fill the Bevespi, try it and see if he gets clinical benefit.

## 2023-02-13 ENCOUNTER — Telehealth: Payer: Self-pay | Admitting: Nurse Practitioner

## 2023-02-13 NOTE — Telephone Encounter (Signed)
Patient said lightheadedness lasted about 10-15 minutes and felt disoriented for about an hour.  Patient was able to exercise after.  Patient is not going to take another dose.  Please advise patient can be reached at [phone].

## 2023-02-13 NOTE — Telephone Encounter (Signed)
Did the symptoms subside after use or have they persisted? Sometimes this occurs with the inhalers and goes away quickly but if the problem persists, he can stop using it. Thanks.

## 2023-02-13 NOTE — Telephone Encounter (Signed)
Attempted to reach patient. No answer, left voicemail.

## 2023-02-13 NOTE — Telephone Encounter (Signed)
Patient is calling stating that he used his inhaler for the first time today and he is experiencing a reaction; light headedness and a strange feeling. He wanted to know if this is common when using an inhaler and if he should continue its use. Please call and advise [phone].

## 2023-02-14 NOTE — Telephone Encounter (Signed)
Patient has been advised, will hold med until OV. Confirmed OV day/time. Nothing further needed at this time.

## 2023-02-14 NOTE — Telephone Encounter (Signed)
He can hold and discuss with Dr. Maple Hudson at follow up on 8/27. Thanks

## 2023-02-17 NOTE — Progress Notes (Unsigned)
Subjective:    Patient ID: David Buck, male    DOB: 1941/04/10, 82 y.o.   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 Bronchoscopy (Dr Everardo All) 04/14/20- BAL-Neg Fungal, Neg AFB, Pos Pseudomonas broadly sens. Had doxycycline prior to procedure, then Levaquin 500 mg x 7 days after. Cytology neg. Lung Bx Positibve MAIC 2024 PFT 07/12/22- Resection lung nodule 08/29/22- > +MAIC. 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 CT chest at [facility] shows old scarring, but a new 13 mm spiculated cavitary nodule RUL Have discussed evaluation. Will order PET. Minimal dry cough. No nodes, blood, fever or chest pain.  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 No active disease as of CT 01/30/23. We will follow this for activity- updating CT in 6 months. He noticed he was quite short of breath for a few days right after surgery, but that has resolved back to baseline.  He is not very active.  I encouraged him to walk for endurance.  He is going to a regular exercise program.  No cough or wheeze.  No phlegm.  He did not like Bevespi-made him lightheaded and felt "odd".  We are going to let him try sample Spiriva for comparison.  ROS-see HPI  + = positive Constitutional:   No-   weight loss, night sweats, fevers, chills, fatigue, lassitude. HEENT:   No-  headaches, difficulty swallowing, tooth/dental problems, sore throat,       No-  sneezing, itching, ear ache, +nasal congestion, +post nasal drip,  CV:  No-   chest pain, orthopnea, PND, swelling in lower extremities, anasarca, dizziness, palpitations Resp: No-   shortness of breath with exertion or at rest.             productive cough,  No- non-productive cough,  coughing up of blood.              No-   change in color of mucus.  No- wheezing.   Skin: No-   rash or lesions. GI:  No-   heartburn, indigestion, abdominal pain, nausea, vomiting,  GU:  MS:  No-   joint pain or swelling.  Neuro-     nothing unusual Psych:  No- change in mood or affect. No depression or anxiety.  No memory loss.  Objective:   Physical Exam General- Alert, Oriented, Affect-appropriate, Distress- none acute.+ tall slender man Skin- rash-none, lesions- none, excoriation- none Lymphadenopathy- none Head- atraumatic            Eyes-              Ears- Hearing, canals normal            Nose- Clear, No- Septal dev, +mucus postnasal drip, polyps, erosion, perforation             Throat- Mallampati II , mucosa clear , drainage- none, tonsils- atrophic  dentures Neck- flexible , trachea midline, no stridor , thyroid nl, carotid no bruit Chest - symmetrical excursion , unlabored           Heart/CV- RRR , no murmur , no gallop  , no rub, nl s1 s2                           - JVD- none , edema- none, stasis changes- none, varices- none           Lung- Clear, unlabored, wheeze- none, cough-none, dullness-none, rub- none           Chest wall-  Abd-  Br/ Gen/ Rectal- Not done, not indicated Extrem- cyanosis- none, clubbing, none, atrophy- none, strength- nl Neuro-+ mild head bobbing tremor    Assessment & Plan:

## 2023-02-19 ENCOUNTER — Encounter: Payer: Self-pay | Admitting: Internal Medicine

## 2023-02-19 ENCOUNTER — Ambulatory Visit: Payer: Medicare Other | Admitting: Internal Medicine

## 2023-02-19 VITALS — BP 104/68 | HR 68 | Temp 97.4°F | Ht 74.0 in | Wt 178.6 lb

## 2023-02-19 DIAGNOSIS — A310 Pulmonary mycobacterial infection: Secondary | ICD-10-CM

## 2023-02-19 DIAGNOSIS — R918 Other nonspecific abnormal finding of lung field: Secondary | ICD-10-CM

## 2023-02-19 DIAGNOSIS — J449 Chronic obstructive pulmonary disease, unspecified: Secondary | ICD-10-CM | POA: Diagnosis not present

## 2023-02-19 NOTE — Patient Instructions (Signed)
Order- sample Spiriva 2.5   inhale 2 puffs daily    see if this helps your breathing  Order- CT chest  no contrast- in 6 months    dx MAIC  , lung nodules

## 2023-02-20 ENCOUNTER — Encounter: Payer: Self-pay | Admitting: Internal Medicine

## 2023-02-20 NOTE — Assessment & Plan Note (Signed)
Primarily dry emphysema.  May not have much benefit from bronchodilators.  Did not tolerate Bevespi. Plan-try sample Spiriva

## 2023-02-20 NOTE — Assessment & Plan Note (Signed)
Biopsy + MAIC Plan- observation for activity/ progression. Repeat CT chest in 6 months

## 2023-03-08 DIAGNOSIS — H353221 Exudative age-related macular degeneration, left eye, with active choroidal neovascularization: Secondary | ICD-10-CM | POA: Diagnosis not present

## 2023-03-19 ENCOUNTER — Ambulatory Visit (INDEPENDENT_AMBULATORY_CARE_PROVIDER_SITE_OTHER): Payer: Medicare Other | Admitting: Thoracic Surgery (Cardiothoracic Vascular Surgery)

## 2023-03-19 ENCOUNTER — Encounter: Payer: Self-pay | Admitting: Thoracic Surgery (Cardiothoracic Vascular Surgery)

## 2023-03-19 VITALS — BP 121/78 | HR 70 | Resp 18 | Ht 74.0 in | Wt 176.1 lb

## 2023-03-19 DIAGNOSIS — Z9889 Other specified postprocedural states: Secondary | ICD-10-CM | POA: Diagnosis not present

## 2023-03-19 DIAGNOSIS — R0602 Shortness of breath: Secondary | ICD-10-CM | POA: Diagnosis not present

## 2023-03-19 DIAGNOSIS — R911 Solitary pulmonary nodule: Secondary | ICD-10-CM | POA: Diagnosis not present

## 2023-03-19 NOTE — Progress Notes (Signed)
[address]       [place] [address]             [phone]     HPI: [name] returns for follow-up after previous wedge resections.  David Buck is an 82 year old man with a history of tobacco abuse, COPD, MAC, hypertension, hypothyroidism, pneumonia, and multiple other medical problems.    He was found to have a suspicious nodule on a CT of the chest which was hypermetabolic on PET.  I did robotic assisted right upper and lower lobe wedge resections in March 2024.  Pathology showed granulomatous disease.  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  Initial postoperative course was complicated by bleeding and air leak.  I last saw him in the office on 12/25/2022.  His pain had improved but he was still having some unusual paresthesias.  Also was still feeling short of breath at that time.  In the interim since his last visit he had another CT and he saw Dr. Maple Hudson.  Continues to have some shortness of breath with exertion.  He was given a sample of the new inhaler but has not tried it yet.  No longer having incisional pain and wants to come off of gabapentin.  Past Medical History:  Diagnosis Date   Allergy    rhinitis   Anxiety    COPD (chronic obstructive pulmonary disease) (HCC)    Essential tremor    Left Arm   Hepatitis    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   Hypertension    Hypothyroidism    Hx   LAFB (left anterior fascicular block) 2008   on EKG   Meniere's disease    OSA (obstructive sleep apnea) 01/25/2022   Osteoarthritis of right knee    Dr Eulah Pont   Pneumonia    2017, 2022   Posterior vitreous detachment 2011   R, body- Dr Allyne Gee   Prostatitis    Tinnitus     Current Outpatient Medications  Medication Sig Dispense Refill   ascorbic acid (VITAMIN C) 500 MG tablet Take 500 mg by mouth daily.     buPROPion (WELLBUTRIN XL) 150 MG 24 hr tablet Take 1 tablet (150 mg total) by mouth daily. Annual appt due in Sept must see provider for  future refills 90 tablet 0   Cholecalciferol (VITAMIN D3) 50 MCG (2000 UT) TABS Take 2,000 Units by mouth every evening.     faricimab-svoa (VABYSMO) 6 MG/0.05ML SOLN intravitreal injection 6 mg by Intravitreal route every 30 (thirty) days. Once every 4 weeks.     gabapentin (NEURONTIN) 100 MG capsule TAKE ONE CAPSULE BY MOUTH NIGHTLY FOR 3 NIGHTS, THEN INCREASE TO ONE CAPSULE TWICE DAILY FOR 3 DAYS, THEN IF TOLERATED, INCREASE TO ONE CAPSULE THREE TIMES DAILY THEREAFTER 90 capsule 3   GLUCOSAMINE HCL PO Take 1,000 mg by mouth daily.     loratadine (CLARITIN) 10 MG tablet Take 1 tablet by mouth daily as needed for allergies.     LORazepam (ATIVAN) 0.5 MG tablet TAKE ONE TABLET BY MOUTH TWICE A DAY AS NEEDED FOR ANXIETY 60 tablet 3   Multiple Vitamin (MULTIVITAMIN WITH MINERALS) TABS tablet Take 1 tablet by mouth every evening.     ondansetron (ZOFRAN) 4 MG tablet Take 1 tablet (4 mg total) by mouth every 8 (eight) hours as needed for nausea or vomiting. 20 tablet 0   Polyethyl Glycol-Propyl Glycol (SYSTANE ULTRA) 0.4-0.3 % SOLN Place 1 drop into both  eyes every evening.     ZINC PICOLINATE PO Take 22 mg by mouth every evening.     tadalafil (CIALIS) 20 MG tablet Take 1 tablet (20 mg total) by mouth daily as needed for erectile dysfunction. 20 tablet 3   No current facility-administered medications for this visit.    Physical Exam BP 121/78 (BP Location: Right Arm, Patient Position: Sitting)   Pulse 70   Resp 18   Ht 6\' 2"  (1.88 m)   Wt 176 lb 1.6 oz (79.9 kg)   SpO2 92% Comment: RA  BMI 22.87 kg/m  82 year old man in no acute distress Well-developed and well-nourished Alert and oriented x 3 with no focal deficits Lungs slightly diminished but clear with no wheezing Cardiac regular rate and rhythm  Diagnostic Tests: CT CHEST WITHOUT CONTRAST   TECHNIQUE: Multidetector CT imaging of the chest was performed using thin slice collimation for electromagnetic bronchoscopy planning  purposes, without intravenous contrast.   RADIATION DOSE REDUCTION: This exam was performed according to the departmental dose-optimization program which includes automated exposure control, adjustment of the mA and/or kV according to patient size and/or use of iterative reconstruction technique.   COMPARISON:  08/08/2022, PET 08/08/2022, CT chest 04/20/2022 and 03/24/2020. CT abdomen pelvis 08/21/2022.   FINDINGS: Cardiovascular: Atherosclerotic calcification of the aorta and coronary arteries. Heart size normal. No pericardial effusion.   Mediastinum/Nodes: Thoracic inlet lymph nodes are not enlarged by CT size criteria. No pathologically enlarged mediastinal or axillary lymph nodes. Hilar regions are difficult to definitively evaluate without IV contrast. Esophagus is grossly unremarkable.   Lungs/Pleura: Biapical pleuroparenchymal scarring. Centrilobular emphysema. New postoperative scarring in the right upper and right lower lobes. Scattered mucoid impaction. Scattered tiny pulmonary nodules measure 4 mm or less size, stable. No pleural fluid. Airway is unremarkable.   Upper Abdomen: Visualized portions of the liver, adrenal glands and right kidney are grossly unremarkable. Low-attenuation mass in the upper pole left kidney measures 7.0 cm and a slightly hyperdense 2.1 cm lesion off the left kidney, incompletely visualized. 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 No specific follow-up necessary. Visualized portions of the spleen, pancreas, stomach and bowel are grossly unremarkable. No upper abdominal adenopathy.   Musculoskeletal: Degenerative changes in the spine.   IMPRESSION: 1. 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 2. 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 3. Aortic  atherosclerosis (ICD10-I70.0). Coronary artery calcification. 4.  Emphysema (ICD10-J43.9).     Electronically Signed   By: Leanna Battles M.D.   On: 02/04/2023 16:15 I personally reviewed the CT images and concur with the findings noted above  Impression: David Buck is an 82 year old man with a history of tobacco abuse, COPD, MAC, hypertension, hypothyroidism, pneumonia, and multiple other medical problems.    Robotic assisted wedge resections of right upper and lower lobe-now about 6 months out from surgery.  Not having pain.  Wants to stop gabapentin.  Recommended that he change to 100 mg nightly for a week and then if no issues he can stop taking it altogether.  No restrictions from my standpoint.  MAC-currently under observation.  Recent CT showed no suspicious nodules.  Plan: Follow-up with Dr. Maple Hudson and Dr. Delton Coombes I will be happy to see David Buck back anytime in the future if I can be of any further assistance with his care  Loreli Slot,  MD [facility] [phone]

## 2023-04-05 DIAGNOSIS — H353221 Exudative age-related macular degeneration, left eye, with active choroidal neovascularization: Secondary | ICD-10-CM | POA: Diagnosis not present

## 2023-04-09 ENCOUNTER — Ambulatory Visit: Payer: Medicare Other | Admitting: Thoracic Surgery (Cardiothoracic Vascular Surgery)

## 2023-04-15 ENCOUNTER — Ambulatory Visit: Payer: Medicare Other | Admitting: Internal Medicine

## 2023-04-19 ENCOUNTER — Encounter: Payer: Self-pay | Admitting: Internal Medicine

## 2023-04-19 ENCOUNTER — Ambulatory Visit (INDEPENDENT_AMBULATORY_CARE_PROVIDER_SITE_OTHER): Payer: Medicare Other | Admitting: Internal Medicine

## 2023-04-19 VITALS — BP 116/66 | HR 61 | Temp 97.8°F | Ht 74.0 in | Wt 182.1 lb

## 2023-04-19 DIAGNOSIS — Z23 Encounter for immunization: Secondary | ICD-10-CM

## 2023-04-19 DIAGNOSIS — E034 Atrophy of thyroid (acquired): Secondary | ICD-10-CM

## 2023-04-19 DIAGNOSIS — R4589 Other symptoms and signs involving emotional state: Secondary | ICD-10-CM

## 2023-04-19 DIAGNOSIS — G250 Essential tremor: Secondary | ICD-10-CM | POA: Diagnosis not present

## 2023-04-19 DIAGNOSIS — R739 Hyperglycemia, unspecified: Secondary | ICD-10-CM | POA: Diagnosis not present

## 2023-04-19 DIAGNOSIS — R1032 Left lower quadrant pain: Secondary | ICD-10-CM | POA: Diagnosis not present

## 2023-04-19 DIAGNOSIS — J449 Chronic obstructive pulmonary disease, unspecified: Secondary | ICD-10-CM

## 2023-04-19 DIAGNOSIS — R972 Elevated prostate specific antigen [PSA]: Secondary | ICD-10-CM | POA: Diagnosis not present

## 2023-04-19 NOTE — Assessment & Plan Note (Signed)
Better Cont w/Wellbutrin XL 150 mg daily.  David Buck is exercising now 2/week - a circuit class

## 2023-04-19 NOTE — Progress Notes (Signed)
Subjective:  Patient ID: David Buck, male    DOB: 05-21-1941  Age: 82 y.o. MRN: [id number]  CC: Medical Management of Chronic Issues ( f/u appt, no concerns )   HPI David Buck presents for COPD David Buck is exercising now 2/week - a circuit class  Outpatient Medications Prior to Visit  Medication Sig Dispense Refill   ascorbic acid (VITAMIN C) 500 MG tablet Take 500 mg by mouth daily.     buPROPion (WELLBUTRIN XL) 150 MG 24 hr tablet Take 1 tablet (150 mg total) by mouth daily. Annual appt due in Sept must see provider for future refills 90 tablet 0   Cholecalciferol (VITAMIN D3) 50 MCG (2000 UT) TABS Take 2,000 Units by mouth every evening.     faricimab-svoa (VABYSMO) 6 MG/0.05ML SOLN intravitreal injection 6 mg by Intravitreal route every 30 (thirty) days. Once every 4 weeks.     GLUCOSAMINE HCL PO Take 1,000 mg by mouth daily.     loratadine (CLARITIN) 10 MG tablet Take 1 tablet by mouth daily as needed for allergies.     LORazepam (ATIVAN) 0.5 MG tablet TAKE ONE TABLET BY MOUTH TWICE A DAY AS NEEDED FOR ANXIETY 60 tablet 3   Multiple Vitamin (MULTIVITAMIN WITH MINERALS) TABS tablet Take 1 tablet by mouth every evening.     ondansetron (ZOFRAN) 4 MG tablet Take 1 tablet (4 mg total) by mouth every 8 (eight) hours as needed for nausea or vomiting. 20 tablet 0   Polyethyl Glycol-Propyl Glycol (SYSTANE ULTRA) 0.4-0.3 % SOLN Place 1 drop into both eyes every evening.     ZINC PICOLINATE PO Take 22 mg by mouth every evening.     gabapentin (NEURONTIN) 100 MG capsule TAKE ONE CAPSULE BY MOUTH NIGHTLY FOR 3 NIGHTS, THEN INCREASE TO ONE CAPSULE TWICE DAILY FOR 3 DAYS, THEN IF TOLERATED, INCREASE TO ONE CAPSULE THREE TIMES DAILY THEREAFTER 90 capsule 3   tadalafil (CIALIS) 20 MG tablet Take 1 tablet (20 mg total) by mouth daily as needed for erectile dysfunction. 20 tablet 3   No facility-administered medications prior to visit.    ROS: Review of Systems  Constitutional:   Negative for appetite change, fatigue and unexpected weight change.  HENT:  Negative for congestion, nosebleeds, sneezing, sore throat and trouble swallowing.   Eyes:  Positive for visual disturbance. Negative for itching.  Respiratory:  Positive for shortness of breath. Negative for cough.   Cardiovascular:  Negative for chest pain, palpitations and leg swelling.  Gastrointestinal:  Negative for abdominal distention, blood in stool, diarrhea and nausea.  Genitourinary:  Negative for frequency and hematuria.  Musculoskeletal:  Negative for back pain, gait problem, joint swelling and neck pain.  Skin:  Negative for rash.  Neurological:  Negative for dizziness, tremors, speech difficulty and weakness.  Psychiatric/Behavioral:  Positive for dysphoric mood. Negative for agitation, sleep disturbance and suicidal ideas. The patient is nervous/anxious.     Objective:  BP 116/66   Pulse 61   Temp 97.8 F (36.6 C) (Oral)   Ht 6\' 2"  (1.88 m)   Wt 182 lb 2 oz (82.6 kg)   SpO2 95%   BMI 23.38 kg/m   BP Readings from Last 3 Encounters:  04/19/23 116/66  03/19/23 121/78  02/19/23 104/68    Wt Readings from Last 3 Encounters:  04/19/23 182 lb 2 oz (82.6 kg)  03/19/23 176 lb 1.6 oz (79.9 kg)  02/19/23 178 lb 9.6 oz (81 kg)    Physical Exam Constitutional:  General: He is not in acute distress.    Appearance: He is well-developed.     Comments: NAD  Eyes:     Conjunctiva/sclera: Conjunctivae normal.     Pupils: Pupils are equal, round, and reactive to light.  Neck:     Thyroid: No thyromegaly.     Vascular: No JVD.  Cardiovascular:     Rate and Rhythm: Normal rate and regular rhythm.     Heart sounds: Normal heart sounds. No murmur heard.    No friction rub. No gallop.  Pulmonary:     Effort: Pulmonary effort is normal. No respiratory distress.     Breath sounds: Normal breath sounds. No wheezing or rales.  Chest:     Chest wall: No tenderness.  Abdominal:     General:  Bowel sounds are normal. There is no distension.     Palpations: Abdomen is soft. There is no mass.     Tenderness: There is no abdominal tenderness. There is no guarding or rebound.  Musculoskeletal:        General: No tenderness. Normal range of motion.     Cervical back: Normal range of motion.  Lymphadenopathy:     Cervical: No cervical adenopathy.  Skin:    General: Skin is warm and dry.     Findings: No rash.  Neurological:     Mental Status: He is alert and oriented to person, place, and time.     Cranial Nerves: No cranial nerve deficit.     Motor: No abnormal muscle tone.     Coordination: Coordination normal.     Gait: Gait normal.     Deep Tendon Reflexes: Reflexes are normal and symmetric.  Psychiatric:        Behavior: Behavior normal.        Thought Content: Thought content normal.        Judgment: Judgment normal.     Lab Results  Component Value Date   WBC 8.1 12/12/2022   HGB 12.2 (L) 12/12/2022   HCT 37.5 (L) 12/12/2022   PLT 273.0 12/12/2022   GLUCOSE 82 12/12/2022   CHOL 144 05/22/2022   TRIG 56.0 05/22/2022   HDL 65.00 05/22/2022   LDLCALC 68 05/22/2022   ALT 19 12/12/2022   AST 22 12/12/2022   NA 138 12/12/2022   K 4.5 12/12/2022   CL 104 12/12/2022   CREATININE 1.18 12/12/2022   BUN 25 (H) 12/12/2022   CO2 28 12/12/2022   TSH 4.58 05/22/2022   PSA 3.08 05/22/2022   INR 1.0 08/29/2022   HGBA1C 6.0 05/22/2022    CT Super D Chest Wo Contrast  Result Date: 02/04/2023 CLINICAL DATA:  Pulmonary nodules. EXAM: CT CHEST WITHOUT CONTRAST TECHNIQUE: Multidetector CT imaging of the chest was performed using thin slice collimation for electromagnetic bronchoscopy planning purposes, without intravenous contrast. RADIATION DOSE REDUCTION: This exam was performed according to the departmental dose-optimization program which includes automated exposure control, adjustment of the mA and/or kV according to patient size and/or use of iterative reconstruction  technique. COMPARISON:  08/08/2022, PET 08/08/2022, CT chest 04/20/2022 and 03/24/2020. CT abdomen pelvis 08/21/2022. FINDINGS: Cardiovascular: Atherosclerotic calcification of the aorta and coronary arteries. Heart size normal. No pericardial effusion. Mediastinum/Nodes: Thoracic inlet lymph nodes are not enlarged by CT size criteria. No pathologically enlarged mediastinal or axillary lymph nodes. Hilar regions are difficult to definitively evaluate without IV contrast. Esophagus is grossly unremarkable. Lungs/Pleura: Biapical pleuroparenchymal scarring. Centrilobular emphysema. New postoperative scarring in the right upper and right  lower lobes. Scattered mucoid impaction. Scattered tiny pulmonary nodules measure 4 mm or less size, stable. No pleural fluid. Airway is unremarkable. Upper Abdomen: Visualized portions of the liver, adrenal glands and right kidney are grossly unremarkable. Low-attenuation mass in the upper pole left kidney measures 7.0 cm and a slightly hyperdense 2.1 cm lesion off the left kidney, incompletely visualized. 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 No specific follow-up necessary. Visualized portions of the spleen, pancreas, stomach and bowel are grossly unremarkable. No upper abdominal adenopathy. Musculoskeletal: Degenerative changes in the spine. IMPRESSION: 1. 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 2. 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 3. Aortic atherosclerosis (ICD10-I70.0). Coronary artery calcification. 4.  Emphysema (ICD10-J43.9). Electronically Signed   By: Leanna Battles M.D.   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      Relevant Orders   TSH   Urinalysis   CBC with Differential/Platelet   Lipid panel   PSA   Comprehensive metabolic panel   Essential tremor    Mild - stable Will watch      Relevant Orders   TSH   Urinalysis   CBC with Differential/Platelet   Lipid panel   PSA   Comprehensive metabolic panel   COPD mixed type (HCC)    MDI samples prn David Buck is exercising now 2/week - a circuit class      Elevated PSA    Monitoring PSA      Relevant Orders   PSA   Groin pain, left - Primary    Probable hernia relapse on the L      Relevant Orders   Ambulatory referral to General Surgery   Depressed mood    Better Cont w/Wellbutrin XL 150 mg daily.  David Buck is exercising now 2/week - a circuit class      Other Visit Diagnoses     Need for vaccination       Relevant Orders   Flu Vaccine Trivalent High Dose (Fluad) (Completed)   Hyperglycemia       Relevant Orders   Hemoglobin A1c         No orders of the defined types were placed in this encounter.     Follow-up: Return in about 3 months (around 07/20/2023) for a follow-up visit.  Sonda Primes, MD

## 2023-04-19 NOTE — Assessment & Plan Note (Signed)
Mild - stable Will watch

## 2023-04-19 NOTE — Assessment & Plan Note (Signed)
Probable hernia relapse on the L

## 2023-04-19 NOTE — Assessment & Plan Note (Signed)
Monitoring PSA 

## 2023-04-19 NOTE — Assessment & Plan Note (Addendum)
MDI samples prn David Buck is exercising now 2/week - a circuit class

## 2023-04-19 NOTE — Assessment & Plan Note (Signed)
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

## 2023-04-22 ENCOUNTER — Encounter: Payer: Self-pay | Admitting: Internal Medicine

## 2023-04-25 NOTE — Addendum Note (Signed)
Addended by: Delsa Grana R on: 04/25/2023 10:01 AM   Modules accepted: Orders

## 2023-04-26 HISTORY — PX: STRABISMUS SURGERY: SHX218

## 2023-04-30 ENCOUNTER — Other Ambulatory Visit (INDEPENDENT_AMBULATORY_CARE_PROVIDER_SITE_OTHER): Payer: Medicare Other

## 2023-04-30 DIAGNOSIS — G250 Essential tremor: Secondary | ICD-10-CM | POA: Diagnosis not present

## 2023-04-30 DIAGNOSIS — R739 Hyperglycemia, unspecified: Secondary | ICD-10-CM | POA: Diagnosis not present

## 2023-04-30 DIAGNOSIS — E034 Atrophy of thyroid (acquired): Secondary | ICD-10-CM | POA: Diagnosis not present

## 2023-04-30 DIAGNOSIS — R972 Elevated prostate specific antigen [PSA]: Secondary | ICD-10-CM

## 2023-04-30 LAB — LIPID PANEL
Cholesterol: 144 mg/dL (ref 0–200)
HDL: 63.1 mg/dL (ref >39.00)
LDL Cholesterol: 71 mg/dL (ref 0–99)
NonHDL: 80.74
Total CHOL/HDL Ratio: 2
Triglycerides: 48 mg/dL (ref 0.0–149.0)
VLDL: 9.6 mg/dL (ref 0.0–40.0)

## 2023-04-30 LAB — CBC WITH DIFFERENTIAL/PLATELET
Basophils Absolute: 0.1 10*3/uL (ref 0.0–0.1)
Basophils Relative: 0.9 % (ref 0.0–3.0)
Eosinophils Absolute: 0.4 10*3/uL (ref 0.0–0.7)
Eosinophils Relative: 6.4 % — ABNORMAL HIGH (ref 0.0–5.0)
HCT: 40.6 % (ref 39.0–52.0)
Hemoglobin: 13.1 g/dL (ref 13.0–17.0)
Lymphocytes Relative: 20.4 % (ref 12.0–46.0)
Lymphs Abs: 1.4 10*3/uL (ref 0.7–4.0)
MCHC: 32.2 g/dL (ref 30.0–36.0)
MCV: 95.5 fL (ref 78.0–100.0)
Monocytes Absolute: 0.6 10*3/uL (ref 0.1–1.0)
Monocytes Relative: 8.9 % (ref 3.0–12.0)
Neutro Abs: 4.3 10*3/uL (ref 1.4–7.7)
Neutrophils Relative %: 63.4 % (ref 43.0–77.0)
Platelets: 295 10*3/uL (ref 150.0–400.0)
RBC: 4.25 Mil/uL (ref 4.22–5.81)
RDW: 12.8 % (ref 11.5–15.5)
WBC: 6.8 10*3/uL (ref 4.0–10.5)

## 2023-04-30 LAB — PSA: PSA: 3.34 ng/mL (ref 0.10–4.00)

## 2023-04-30 LAB — COMPREHENSIVE METABOLIC PANEL
ALT: 35 U/L (ref 0–53)
AST: 28 U/L (ref 0–37)
Albumin: 4.2 g/dL (ref 3.5–5.2)
Alkaline Phosphatase: 81 U/L (ref 39–117)
BUN: 24 mg/dL — ABNORMAL HIGH (ref 6–23)
CO2: 31 meq/L (ref 19–32)
Calcium: 9.8 mg/dL (ref 8.4–10.5)
Chloride: 101 meq/L (ref 96–112)
Creatinine, Ser: 1.29 mg/dL (ref 0.40–1.50)
GFR: 52.09 mL/min — ABNORMAL LOW (ref >60.00)
Glucose, Bld: 105 mg/dL — ABNORMAL HIGH (ref 70–99)
Potassium: 5.1 meq/L (ref 3.5–5.1)
Sodium: 138 meq/L (ref 135–145)
Total Bilirubin: 0.5 mg/dL (ref 0.2–1.2)
Total Protein: 6.9 g/dL (ref 6.0–8.3)

## 2023-04-30 LAB — URINALYSIS
Bilirubin Urine: NEGATIVE
Ketones, ur: NEGATIVE
Leukocytes,Ua: NEGATIVE
Nitrite: NEGATIVE
Specific Gravity, Urine: 1.02 (ref 1.000–1.030)
Total Protein, Urine: NEGATIVE
Urine Glucose: NEGATIVE
Urobilinogen, UA: 0.2 — AB (ref 0.0–1.0)
pH: 6 (ref 5.0–8.0)

## 2023-04-30 LAB — TSH: TSH: 7.35 u[IU]/mL — ABNORMAL HIGH (ref 0.35–5.50)

## 2023-04-30 LAB — HEMOGLOBIN A1C: Hgb A1c MFr Bld: 5.8 % (ref 4.6–6.5)

## 2023-05-02 DIAGNOSIS — R1032 Left lower quadrant pain: Secondary | ICD-10-CM | POA: Diagnosis not present

## 2023-05-03 ENCOUNTER — Other Ambulatory Visit: Payer: Self-pay | Admitting: General Surgery

## 2023-05-03 DIAGNOSIS — H4421 Degenerative myopia, right eye: Secondary | ICD-10-CM | POA: Diagnosis not present

## 2023-05-03 DIAGNOSIS — H59812 Chorioretinal scars after surgery for detachment, left eye: Secondary | ICD-10-CM | POA: Diagnosis not present

## 2023-05-03 DIAGNOSIS — H353221 Exudative age-related macular degeneration, left eye, with active choroidal neovascularization: Secondary | ICD-10-CM | POA: Diagnosis not present

## 2023-05-03 DIAGNOSIS — K4190 Unilateral femoral hernia, without obstruction or gangrene, not specified as recurrent: Secondary | ICD-10-CM

## 2023-05-03 DIAGNOSIS — H35373 Puckering of macula, bilateral: Secondary | ICD-10-CM | POA: Diagnosis not present

## 2023-05-06 DIAGNOSIS — Z9889 Other specified postprocedural states: Secondary | ICD-10-CM | POA: Diagnosis not present

## 2023-05-06 DIAGNOSIS — H532 Diplopia: Secondary | ICD-10-CM | POA: Diagnosis not present

## 2023-05-06 DIAGNOSIS — H35052 Retinal neovascularization, unspecified, left eye: Secondary | ICD-10-CM | POA: Diagnosis not present

## 2023-05-06 DIAGNOSIS — H5022 Vertical strabismus, left eye: Secondary | ICD-10-CM | POA: Diagnosis not present

## 2023-05-10 ENCOUNTER — Encounter (HOSPITAL_BASED_OUTPATIENT_CLINIC_OR_DEPARTMENT_OTHER): Payer: Self-pay | Admitting: Cardiovascular Disease

## 2023-05-13 ENCOUNTER — Telehealth: Payer: Self-pay | Admitting: *Deleted

## 2023-05-13 ENCOUNTER — Encounter: Payer: Self-pay | Admitting: Internal Medicine

## 2023-05-13 NOTE — Telephone Encounter (Signed)
FYI [facility] is faxing over results from 04/30/23.  Pt has been notified.

## 2023-05-13 NOTE — Telephone Encounter (Signed)
Pt stated that labs have not been released yet and was told by his office to call here, since they were drawn here.

## 2023-05-13 NOTE — Telephone Encounter (Signed)
Spoke with Clydie Braun at [facility].  Labs was done and resulted.  She tried manually sending to Epic but would not go through.  She will fax over results to pts PCP.

## 2023-05-14 ENCOUNTER — Other Ambulatory Visit: Payer: Self-pay | Admitting: Internal Medicine

## 2023-05-15 NOTE — Telephone Encounter (Signed)
See mychart message from Dr. Posey Rea office.

## 2023-05-15 NOTE — Telephone Encounter (Signed)
There still has been no labs sent over from [facility].

## 2023-05-15 NOTE — Telephone Encounter (Signed)
Please advise 

## 2023-05-20 ENCOUNTER — Ambulatory Visit (HOSPITAL_BASED_OUTPATIENT_CLINIC_OR_DEPARTMENT_OTHER): Payer: Medicare Other | Admitting: Cardiovascular Disease

## 2023-05-20 ENCOUNTER — Encounter (HOSPITAL_BASED_OUTPATIENT_CLINIC_OR_DEPARTMENT_OTHER): Payer: Self-pay | Admitting: Cardiovascular Disease

## 2023-05-20 VITALS — BP 110/62 | HR 65 | Ht 74.0 in | Wt 183.6 lb

## 2023-05-20 DIAGNOSIS — R0609 Other forms of dyspnea: Secondary | ICD-10-CM | POA: Insufficient documentation

## 2023-05-20 DIAGNOSIS — I2583 Coronary atherosclerosis due to lipid rich plaque: Secondary | ICD-10-CM

## 2023-05-20 DIAGNOSIS — G4733 Obstructive sleep apnea (adult) (pediatric): Secondary | ICD-10-CM

## 2023-05-20 DIAGNOSIS — I4510 Unspecified right bundle-branch block: Secondary | ICD-10-CM

## 2023-05-20 DIAGNOSIS — I10 Essential (primary) hypertension: Secondary | ICD-10-CM | POA: Diagnosis not present

## 2023-05-20 DIAGNOSIS — I700 Atherosclerosis of aorta: Secondary | ICD-10-CM | POA: Diagnosis not present

## 2023-05-20 DIAGNOSIS — R9431 Abnormal electrocardiogram [ECG] [EKG]: Secondary | ICD-10-CM

## 2023-05-20 DIAGNOSIS — G459 Transient cerebral ischemic attack, unspecified: Secondary | ICD-10-CM

## 2023-05-20 HISTORY — DX: Other forms of dyspnea: R06.09

## 2023-05-20 MED ORDER — METOPROLOL TARTRATE 25 MG PO TABS: ORAL_TABLET | ORAL | 0 refills | Status: DC

## 2023-05-20 NOTE — Patient Instructions (Signed)
Medication Instructions:  HOLD YOUR OLMESARTAN NIGHT BEFORE CT, TAKE METOPROLOL 25 MG 1 TABLET 2 HOURS PRIOR TO CT   *If you need a refill on your cardiac medications before your next appointment, please call your pharmacy*  Lab Work: [facility] IN ADDITION TO OTHER LABS YOUR PRIMARY REQUESTED   If you have labs (blood work) drawn today and your tests are completely normal, you will receive your results only by: MyChart Message (if you have MyChart) OR A paper copy in the mail If you have any lab test that is abnormal or we need to change your treatment, we will call you to review the results.  Testing/Procedures: Your physician has requested that you have cardiac CT. Cardiac computed tomography (CT) is a painless test that uses an x-ray machine to take clear, detailed pictures of your heart. For further information please visit [email or web address] Please follow instruction sheet as given. IF YOU DO NOT HEAR IN 2 WEEKS CALL THE OFFICE TO FOLLOW UP   Follow-Up: At [facility], you and your health needs are our priority.  As part of our continuing mission to provide you with exceptional heart care, we have created designated Provider Care Teams.  These Care Teams include your primary Cardiologist (physician) and Advanced Practice Providers (APPs -  Physician Assistants and Nurse Practitioners) who all work together to provide you with the care you need, when you need it.  We recommend signing up for the patient portal called "MyChart".  Sign up information is provided on this After Visit Summary.  MyChart is used to connect with patients for Virtual Visits (Telemedicine).  Patients are able to view lab/test results, encounter notes, upcoming appointments, etc.  Non-urgent messages can be sent to your provider as well.   To learn more about what you can do with MyChart, go to [email or web address].    Your next appointment:   12 month(s)  Provider:   Chilton Si, MD or Gillian Shields, NP    Other Instructions    Your cardiac CT will be scheduled at one of the below locations:   [facility] [address] [place], [place] [address] [phone]  OR  [facility] [address] [place], [place] [address] [phone]  OR   [facility] [address] [place], [place] [address] [phone]  If scheduled at [facility], please arrive at the [facility] Entrance (Entrance C2) of [facility] 30 minutes prior to test start time. You can use the FREE valet parking offered at entrance C (encouraged to control the heart rate for the test)  Proceed to the [facility] Radiology Department (first floor) to check-in and test prep.  All radiology patients and guests should use entrance C2 at [facility], accessed from [facility], even though the hospital's physical address listed is [address].    If scheduled at [facility] or [facility], please arrive 15 mins early for check-in and test prep.  There is spacious parking and easy access to the radiology department from the [facility] Vascular entrance. Please enter here and check-in with the desk attendant.   Please follow these instructions carefully (unless otherwise directed):  An IV will be required for this test and Nitroglycerin will be given.  Hold all erectile dysfunction medications at least 3 days (72 hrs) prior to test. (Ie viagra, cialis, sildenafil, tadalafil, etc)  On the Night Before the Test: Be sure to Drink plenty of water. Do not consume any caffeinated/decaffeinated beverages or chocolate 12 hours prior to your test. Do not take any antihistamines 12 hours prior to your test.  On the Day of the Test: Drink plenty of water until 1 hour prior to the test. Do not eat any food 1 hour prior to  test. You may take your regular medications prior to the test.  Take metoprolol (Lopressor) two hours prior to test. If you take Furosemide/Hydrochlorothiazide/Spironolactone, please HOLD on the morning of the test.  After the Test: Drink plenty of water. After receiving IV contrast, you may experience a mild flushed feeling. This is normal. On occasion, you may experience a mild rash up to 24 hours after the test. This is not dangerous. If this occurs, you can take Benadryl 25 mg and increase your fluid intake. If you experience trouble breathing, this can be serious. If it is severe call 911 IMMEDIATELY. If it is mild, please call our office. If you take any of these medications: Glipizide/Metformin, Avandament, Glucavance, please do not take 48 hours after completing test unless otherwise instructed.  We will call to schedule your test 2-4 weeks out understanding that some insurance companies will need an authorization prior to the service being performed.   For more information and frequently asked questions, please visit our website : [email or web address]  For non-scheduling related questions, please contact the cardiac imaging nurse navigator should you have any questions/concerns: Cardiac Imaging Nurse Navigators Direct Office Dial: [phone]   For scheduling needs, including cancellations and rescheduling, please call [place], [phone].  Cardiac CT Angiogram A cardiac CT angiogram is a procedure to look at the heart and the area around the heart. It may be done to help find the cause of chest pains or other symptoms of heart disease. During this procedure, a substance called contrast dye is injected into a vein in the arm. The contrast highlights the blood vessels in the area to be checked. A large X-ray machine (CT scanner), then takes detailed pictures of the heart and the surrounding area. The procedure is also sometimes called a coronary CT angiogram, coronary  artery scanning, or CTA. A cardiac CT angiogram allows the health care provider to see how well blood is flowing to and from the heart. The provider will be able to see if there are any problems, such as: Blockage or narrowing of the arteries in the heart. Fluid around the heart. Signs of weakness or disease in the muscles, valves, and tissues of the heart. Tell a health care provider about: Any allergies you have. This is especially important if you have had a previous allergic reaction to medicines, contrast dye, or iodine. All medicines you are taking, including vitamins, herbs, eye drops, creams, and over-the-counter medicines. Any bleeding problems you have. Any surgeries you have had. Any medical conditions you have, including kidney problems or kidney failure. Whether you are pregnant or may be pregnant. Any anxiety disorders, chronic pain, or other conditions you have. These may increase your stress or prevent you from lying still. Any history of abnormal heart rhythms or heart procedures. What are the risks? Your provider will talk with you about risks. These may include: Bleeding. Infection. Allergic reactions to medicines or dyes. Damage to other structures or organs. Kidney damage from the contrast dye. Increased risk of cancer from radiation exposure. This risk is low. Talk with your provider about: The risks and benefits of testing. How  you can receive the lowest dose of radiation. What happens before the procedure? Wear comfortable clothing and remove any jewelry, glasses, dentures, and hearing aids. Follow instructions from your provider about eating and drinking. These may include: 12 hours before the procedure Avoid caffeine. This includes tea, coffee, soda, energy drinks, and diet pills. Drink plenty of water or other fluids that do not have caffeine in them. Being well hydrated can prevent complications. 4-6 hours before the procedure Stop eating and drinking. This  will reduce the risk of nausea from the contrast dye. Ask your provider about changing or stopping your regular medicines. These include: Diabetes medicines. Medicines to treat problems with erections (erectile dysfunction). If you have kidney problems, you may need to receive IV hydration before and after the test. What happens during the procedure?  Hair on your chest may need to be removed so that small sticky patches called electrodes can be placed on your chest. These will transmit information that helps to monitor your heart during the procedure. An IV will be inserted into one of your veins. You might be given a medicine to control your heart rate during the procedure. This will help to ensure that good images are obtained. You will be asked to lie on an exam table. This table will slide in and out of the CT machine during the procedure. Contrast dye will be injected into the IV. You might feel warm, or you may get a metallic taste in your mouth. You may be given medicines to relax or dilate the arteries in your heart. If you are allergic to contrast dyes or iodine you may be given medicine before the test to reduce the risk of an allergic reaction. The table that you are lying on will move into the CT machine tunnel for the scan. The person running the machine will give you instructions while the scans are being done. You may be asked to: Keep your arms above your head. Hold your breath for short periods. Stay very still, even if the table is moving. The procedure may vary among providers and hospitals. What can I expect after the procedure? After your procedure, it is common to have: A metallic taste in your mouth from the contrast dye. A feeling of warmth. A headache from the heart medicine. Follow these instructions at home: Take over-the-counter and prescription medicines only as told by your provider. If you are told, drink enough fluid to keep your pee pale yellow. This will  help to flush the contrast dye out of your body. Most people can return to their normal activities right after the procedure. Ask your provider what activities are safe for you. It is up to you to get the results of your procedure. Ask your provider, or the department that is doing the procedure, when your results will be ready. Contact a health care provider if: You have any symptoms of allergy to the contrast dye. These include: Shortness of breath. Rash or hives. A racing heartbeat. You notice a change in your peeing (urination). This information is not intended to replace advice given to you by your health care provider. Make sure you discuss any questions you have with your health care provider. Document Revised: 01/12/2022 Document Reviewed: 01/12/2022 Elsevier Patient Education  2024 [employer].

## 2023-05-20 NOTE — Progress Notes (Signed)
Cardiology Office Note:  .   Date:  05/20/2023  ID:  David Buck, DOB 1941/02/07, MRN [id number] PCP: Tresa Garter, MD  [facility] Providers Cardiologist:  None    History of Present Illness: .   David Buck is a 82 y.o. male with OSA, COPD, MAC, hypothyroidism hypertension, aortic atherosclerosis, coronary calcification, hyperlipidemia, and Raynaud's disorder who is being seen today for the evaluation of shortness of breath at the request of [name], Georgina Quint, MD.  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  He has followed with Dr. Dorris Fetch and continued to note some shortness of breath.  He saw Dr. Maple Hudson and was given an inhaler but had not tried it yet.  He previously saw Dr. Allyson Sabal in 2019 after an episode of syncope.  He was on hydrochlorothiazide and the syncope occurred soon after increasing the dose.  EMS was called and he was reportedly hypotensive and bradycardic.  It was thought that this was at least partially vagal in nature.  HCTZ was discontinued.  Blood pressures were been uncontrolled and amlodipine was started.  Discussed the use of AI scribe software for clinical note transcription with the patient, who gave verbal consent to proceed.  History of Present Illness   David Buck presents with complaints of persistent belching, flatulence, and a sensation of blocked ears. The patient reports these symptoms have been ongoing, with the ear blockage appearing to worsen in the late afternoon and improve by late evening. The patient has been managing these symptoms with loratadine as needed.  He saw ENT and is still unclear on why this occurs.    He also reports a significant change in exercise tolerance post-thoracic surgery, experiencing shortness of breath with exertion. This is a new development since the surgery, and despite a recovery in hemoglobin and  hematocrit levels post-surgery, the patient continues to experience this breathlessness.  When he checks his O2 sat at home it is in the 90s even with exertion.  He has no exertional CP, LE edema, orthopnea or PND.  His diet is reported to be mixed, with some processed and fried foods included, but overall fairly healthy.    The patient also mentions an upcoming CT scan for a femoral hernia. The patient's family history includes cerebral hemorrhage in the father. The patient's exercise routine includes a circuits class twice a week and occasional dog walking.      ROS:  As per HPI  Studies Reviewed: Marland Kitchen       Chest CT 01/2023: IMPRESSION: 1. 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 2. 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 3. Aortic atherosclerosis (ICD10-I70.0). Coronary artery calcification. 4.  Emphysema (ICD10-J43.9).    Risk Assessment/Calculations:             Physical Exam:    VS:  BP 110/62 (BP Location: Left Arm, Patient Position: Sitting, Cuff Size: Normal)   Pulse 65   Ht 6\' 2"  (1.88 m)   Wt 183 lb 9.6 oz (83.3 kg)   SpO2 97%   BMI 23.57 kg/m  , BMI Body mass index is 23.57 kg/m. GENERAL:  Well appearing HEENT: Pupils equal round and reactive, fundi not visualized, oral mucosa unremarkable NECK:  No jugular venous distention, waveform within normal limits, carotid upstroke brisk  and symmetric, no bruits, no thyromegaly LUNGS:  Clear to auscultation bilaterally HEART:  RRR.  PMI not displaced or sustained,S1 and S2 within normal limits, no S3, no S4, no clicks, no rubs, no murmurs ABD:  Flat, positive bowel sounds normal in frequency in pitch, no bruits, no rebound, no guarding, no midline pulsatile mass, no hepatomegaly, no splenomegaly EXT:  2 plus pulses throughout, no edema, no cyanosis no clubbing SKIN:  No rashes no  nodules NEURO:  Cranial nerves II through XII grossly intact, motor grossly intact throughout PSYCH:  Cognitively intact, oriented to person place and time   ASSESSMENT AND PLAN: .    # Coronary Artery Disease Plaque noted in aorta and coronary arteries on previous CT scan. LDL within target range. Family history of cerebral hemorrhage. -Order coronary CT-A to assess severity of coronary artery disease. -Order Lp(a) to assess genetic risk. -Advise patient to hold Olmesartan night before CT angiogram.  # Shortness of Breath Post thoracic surgery for mycobacterium Avium. Hemoglobin and hematocrit levels dropped post-surgery but have since improved. -Continue monitoring symptoms.  # Hyperlipidemia Previous lipid panel results were satisfactory. -Redraw lipid panel along with Lp(a) when patient is fasting.  # General Health Maintenance -Encourage healthier dietary choices, particularly reducing intake of processed meats and pastries. -Follow-up in a year, or sooner if any concerning results from tests.      Dispo: f/u 1 year  Signed, Chilton Si, MD

## 2023-05-21 ENCOUNTER — Ambulatory Visit: Payer: Medicare Other | Admitting: Internal Medicine

## 2023-05-21 ENCOUNTER — Encounter: Payer: Self-pay | Admitting: Internal Medicine

## 2023-05-21 VITALS — BP 126/80 | HR 65 | Temp 97.7°F | Ht 74.0 in | Wt 183.4 lb

## 2023-05-21 DIAGNOSIS — G250 Essential tremor: Secondary | ICD-10-CM | POA: Diagnosis not present

## 2023-05-21 DIAGNOSIS — E079 Disorder of thyroid, unspecified: Secondary | ICD-10-CM

## 2023-05-21 DIAGNOSIS — H0589 Other disorders of orbit: Secondary | ICD-10-CM | POA: Diagnosis not present

## 2023-05-21 DIAGNOSIS — H539 Unspecified visual disturbance: Secondary | ICD-10-CM | POA: Diagnosis not present

## 2023-05-21 DIAGNOSIS — R140 Abdominal distension (gaseous): Secondary | ICD-10-CM | POA: Diagnosis not present

## 2023-05-21 DIAGNOSIS — E034 Atrophy of thyroid (acquired): Secondary | ICD-10-CM | POA: Diagnosis not present

## 2023-05-21 DIAGNOSIS — R1032 Left lower quadrant pain: Secondary | ICD-10-CM

## 2023-05-21 DIAGNOSIS — I10 Essential (primary) hypertension: Secondary | ICD-10-CM | POA: Diagnosis not present

## 2023-05-21 MED ORDER — ALIGN 4 MG PO CAPS: 1.0000 | ORAL_CAPSULE | Freq: Every day | ORAL | 0 refills | Status: DC

## 2023-05-21 MED ORDER — METRONIDAZOLE 500 MG PO TABS: 500.0000 mg | ORAL_TABLET | Freq: Three times a day (TID) | ORAL | 0 refills | Status: DC

## 2023-05-21 NOTE — Assessment & Plan Note (Signed)
Surgery is pending in Dec 2024 at [facility] - Dr Zachery Conch

## 2023-05-21 NOTE — Assessment & Plan Note (Signed)
Check TSH, FT4, FT3 Repeat CMET

## 2023-05-21 NOTE — Assessment & Plan Note (Addendum)
F/u w/Dr Andrey Campanile; CT is pending

## 2023-05-21 NOTE — Assessment & Plan Note (Addendum)
?  etiology Start Metronidazole x 10 d and a probiotic Abd CT is pending w/Dr Andrey Campanile

## 2023-05-21 NOTE — Progress Notes (Signed)
Subjective:  Patient ID: David Buck, male    DOB: January 28, 1941  Age: 82 y.o. MRN: [id number]  CC: Follow-up and Bloated (Notes of abdominal fullness and gas/belching x1-2 weeks. Notes of lack of appetite due to fullness. Has gotten better since initially making the appointment)   HPI David Buck presents for abn TSH, elev glucose, decreased GFR C/o burping, gas, bloating x 1-2 months. No pain  Outpatient Medications Prior to Visit  Medication Sig Dispense Refill   ascorbic acid (VITAMIN C) 500 MG tablet Take 500 mg by mouth daily.     buPROPion (WELLBUTRIN XL) 150 MG 24 hr tablet TAKE 1 TABLET BY MOUTH DAILY. MUST SEE PROVIDER FOR FURTHER REFILLS 90 tablet 1   Cholecalciferol (VITAMIN D3) 50 MCG (2000 UT) TABS Take 2,000 Units by mouth every evening.     faricimab-svoa (VABYSMO) 6 MG/0.05ML SOLN intravitreal injection 6 mg by Intravitreal route every 30 (thirty) days. Once every 4 weeks.     GLUCOSAMINE HCL PO Take 1,000 mg by mouth daily.     loratadine (CLARITIN) 10 MG tablet Take 1 tablet by mouth daily as needed for allergies.     LORazepam (ATIVAN) 0.5 MG tablet TAKE ONE TABLET BY MOUTH TWICE A DAY AS NEEDED FOR ANXIETY 60 tablet 3   metoprolol tartrate (LOPRESSOR) 25 MG tablet Take 1 tablet (25 mg) TWO hours prior to CT scan 1 tablet 0   Multiple Vitamin (MULTIVITAMIN WITH MINERALS) TABS tablet Take 1 tablet by mouth every evening.     olmesartan (BENICAR) 20 MG tablet Take 20 mg by mouth daily.     Polyethyl Glycol-Propyl Glycol (SYSTANE ULTRA) 0.4-0.3 % SOLN Place 1 drop into both eyes every evening.     ZINC PICOLINATE PO Take 22 mg by mouth every evening.     ondansetron (ZOFRAN) 4 MG tablet Take 1 tablet (4 mg total) by mouth every 8 (eight) hours as needed for nausea or vomiting. (Patient not taking: Reported on 05/20/2023) 20 tablet 0   tadalafil (CIALIS) 20 MG tablet Take 1 tablet (20 mg total) by mouth daily as needed for erectile dysfunction. 20 tablet 3   No  facility-administered medications prior to visit.    ROS: Review of Systems  Constitutional:  Negative for appetite change, fatigue and unexpected weight change.  HENT:  Negative for congestion, nosebleeds, sneezing, sore throat and trouble swallowing.   Eyes:  Positive for visual disturbance. Negative for itching.  Respiratory:  Negative for cough.   Cardiovascular:  Negative for chest pain, palpitations and leg swelling.  Gastrointestinal:  Positive for abdominal distention. Negative for abdominal pain, anal bleeding, blood in stool, constipation, diarrhea, nausea, rectal pain and vomiting.  Genitourinary:  Negative for frequency and hematuria.  Musculoskeletal:  Negative for back pain, gait problem, joint swelling and neck pain.  Skin:  Negative for rash.  Neurological:  Negative for dizziness, tremors, speech difficulty, weakness and light-headedness.  Psychiatric/Behavioral:  Negative for agitation, dysphoric mood and sleep disturbance. The patient is not nervous/anxious.     Objective:  BP 126/80   Pulse 65   Temp 97.7 F (36.5 C) (Oral)   Ht 6\' 2"  (1.88 m)   Wt 183 lb 6.4 oz (83.2 kg)   SpO2 97%   BMI 23.55 kg/m   BP Readings from Last 3 Encounters:  05/21/23 126/80  05/20/23 110/62  04/19/23 116/66    Wt Readings from Last 3 Encounters:  05/21/23 183 lb 6.4 oz (83.2 kg)  05/20/23 183  lb 9.6 oz (83.3 kg)  04/19/23 182 lb 2 oz (82.6 kg)    Physical Exam Constitutional:      General: He is not in acute distress.    Appearance: Normal appearance. He is well-developed.     Comments: NAD  Eyes:     Conjunctiva/sclera: Conjunctivae normal.     Pupils: Pupils are equal, round, and reactive to light.  Neck:     Thyroid: No thyromegaly.     Vascular: No JVD.  Cardiovascular:     Rate and Rhythm: Normal rate and regular rhythm.     Heart sounds: Normal heart sounds. No murmur heard.    No friction rub. No gallop.  Pulmonary:     Effort: Pulmonary effort is  normal. No respiratory distress.     Breath sounds: Normal breath sounds. No wheezing or rales.  Chest:     Chest wall: No tenderness.  Abdominal:     General: Bowel sounds are normal. There is no distension.     Palpations: Abdomen is soft. There is no mass.     Tenderness: There is no abdominal tenderness. There is no guarding or rebound.     Hernia: A hernia is present.  Musculoskeletal:        General: No tenderness. Normal range of motion.     Cervical back: Normal range of motion.  Lymphadenopathy:     Cervical: No cervical adenopathy.  Skin:    General: Skin is warm and dry.     Findings: No rash.  Neurological:     Mental Status: He is alert and oriented to person, place, and time.     Cranial Nerves: No cranial nerve deficit.     Motor: No abnormal muscle tone.     Coordination: Coordination normal.     Gait: Gait normal.     Deep Tendon Reflexes: Reflexes are normal and symmetric.  Psychiatric:        Behavior: Behavior normal.        Thought Content: Thought content normal.        Judgment: Judgment normal.     Lab Results  Component Value Date   WBC 8.1 12/12/2022   HGB 12.2 (L) 12/12/2022   HCT 37.5 (L) 12/12/2022   PLT 273.0 12/12/2022   GLUCOSE 82 12/12/2022   CHOL 144 05/22/2022   TRIG 56.0 05/22/2022   HDL 65.00 05/22/2022   LDLCALC 68 05/22/2022   ALT 19 12/12/2022   AST 22 12/12/2022   NA 138 12/12/2022   K 4.5 12/12/2022   CL 104 12/12/2022   CREATININE 1.18 12/12/2022   BUN 25 (H) 12/12/2022   CO2 28 12/12/2022   TSH 4.58 05/22/2022   PSA 3.08 05/22/2022   INR 1.0 08/29/2022   HGBA1C 6.0 05/22/2022    CT Super D Chest Wo Contrast  Result Date: 02/04/2023 CLINICAL DATA:  Pulmonary nodules. EXAM: CT CHEST WITHOUT CONTRAST TECHNIQUE: Multidetector CT imaging of the chest was performed using thin slice collimation for electromagnetic bronchoscopy planning purposes, without intravenous contrast. RADIATION DOSE REDUCTION: This exam was  performed according to the departmental dose-optimization program which includes automated exposure control, adjustment of the mA and/or kV according to patient size and/or use of iterative reconstruction technique. COMPARISON:  08/08/2022, PET 08/08/2022, CT chest 04/20/2022 and 03/24/2020. CT abdomen pelvis 08/21/2022. FINDINGS: Cardiovascular: Atherosclerotic calcification of the aorta and coronary arteries. Heart size normal. No pericardial effusion. Mediastinum/Nodes: Thoracic inlet lymph nodes are not enlarged by CT size criteria. No pathologically  enlarged mediastinal or axillary lymph nodes. Hilar regions are difficult to definitively evaluate without IV contrast. Esophagus is grossly unremarkable. Lungs/Pleura: Biapical pleuroparenchymal scarring. Centrilobular emphysema. New postoperative scarring in the right upper and right lower lobes. Scattered mucoid impaction. Scattered tiny pulmonary nodules measure 4 mm or less size, stable. No pleural fluid. Airway is unremarkable. Upper Abdomen: Visualized portions of the liver, adrenal glands and right kidney are grossly unremarkable. Low-attenuation mass in the upper pole left kidney measures 7.0 cm and a slightly hyperdense 2.1 cm lesion off the left kidney, incompletely visualized. 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 No specific follow-up necessary. Visualized portions of the spleen, pancreas, stomach and bowel are grossly unremarkable. No upper abdominal adenopathy. Musculoskeletal: Degenerative changes in the spine. IMPRESSION: 1. 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 2. 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 3. Aortic atherosclerosis (ICD10-I70.0). Coronary artery calcification. 4.  Emphysema (ICD10-J43.9). Electronically Signed   By: Leanna Battles M.D.    On: 02/04/2023 16:15    Assessment & Plan:   Problem List Items Addressed This Visit     Hypothyroidism    Check TSH, FT4, FT3 Repeat CMET      Relevant Orders   T4, free   T3, free   TSH   Comprehensive metabolic panel   Essential tremor    Mild No change      Essential hypertension     On Benicar      Vision changes    Surgery is pending in Dec 2024 at [facility] - Dr Zachery Conch      Thyroid orbitopathy    Surgery is pending in Dec 2024 at [facility] - Dr Zachery Conch      Groin pain, left    F/u w/Dr Andrey Campanile; CT is pending      Bloating - Primary    ?etiology Start Metronidazole x 10 d and a probiotic Abd CT is pending w/Dr Andrey Campanile      Relevant Orders   Comprehensive metabolic panel      Meds ordered this encounter  Medications   metroNIDAZOLE (FLAGYL) 500 MG tablet    Sig: Take 1 tablet (500 mg total) by mouth 3 (three) times daily.    Dispense:  30 tablet    Refill:  0   Probiotic Product (ALIGN) 4 MG CAPS    Sig: Take 1 capsule (4 mg total) by mouth daily.    Dispense:  30 capsule    Refill:  0      Follow-up: Return in about 3 months (around 08/21/2023) for a follow-up visit.  Sonda Primes, MD

## 2023-05-21 NOTE — Assessment & Plan Note (Signed)
Surgery is pending in Dec 2024 at [facility] - Dr Zachery Conch

## 2023-05-21 NOTE — Assessment & Plan Note (Signed)
Mild. No change

## 2023-05-21 NOTE — Assessment & Plan Note (Signed)
On Benicar

## 2023-05-24 ENCOUNTER — Encounter (HOSPITAL_BASED_OUTPATIENT_CLINIC_OR_DEPARTMENT_OTHER): Payer: Self-pay | Admitting: Cardiovascular Disease

## 2023-05-30 ENCOUNTER — Ambulatory Visit
Admission: RE | Admit: 2023-05-30 | Discharge: 2023-05-30 | Disposition: A | Discharge status: Home / Self Care | Payer: Medicare Other | Source: Ambulatory Visit | Attending: General Surgery | Admitting: General Surgery

## 2023-05-30 DIAGNOSIS — I700 Atherosclerosis of aorta: Secondary | ICD-10-CM | POA: Diagnosis not present

## 2023-05-30 DIAGNOSIS — K4190 Unilateral femoral hernia, without obstruction or gangrene, not specified as recurrent: Secondary | ICD-10-CM

## 2023-05-30 MED ORDER — IOPAMIDOL (ISOVUE-300) INJECTION 61%
200.0000 mL | Freq: Once | INTRAVENOUS | Status: AC | PRN
Start: 2023-05-30 — End: 2023-05-30
  Administered 2023-05-30: 80 mL via INTRAVENOUS

## 2023-05-31 DIAGNOSIS — H353221 Exudative age-related macular degeneration, left eye, with active choroidal neovascularization: Secondary | ICD-10-CM | POA: Diagnosis not present

## 2023-06-03 ENCOUNTER — Other Ambulatory Visit (INDEPENDENT_AMBULATORY_CARE_PROVIDER_SITE_OTHER): Payer: Medicare Other

## 2023-06-03 ENCOUNTER — Telehealth (HOSPITAL_BASED_OUTPATIENT_CLINIC_OR_DEPARTMENT_OTHER): Payer: Self-pay

## 2023-06-03 ENCOUNTER — Encounter (HOSPITAL_COMMUNITY): Payer: Self-pay

## 2023-06-03 DIAGNOSIS — R140 Abdominal distension (gaseous): Secondary | ICD-10-CM

## 2023-06-03 DIAGNOSIS — E034 Atrophy of thyroid (acquired): Secondary | ICD-10-CM

## 2023-06-03 DIAGNOSIS — I700 Atherosclerosis of aorta: Secondary | ICD-10-CM | POA: Diagnosis not present

## 2023-06-03 LAB — TSH: TSH: 7.97 u[IU]/mL — ABNORMAL HIGH (ref 0.35–5.50)

## 2023-06-03 LAB — T4, FREE: Free T4: 0.86 ng/dL (ref 0.60–1.60)

## 2023-06-03 LAB — T3, FREE: T3, Free: 3.2 pg/mL (ref 2.3–4.2)

## 2023-06-03 NOTE — Telephone Encounter (Signed)
Order replaced for lab.

## 2023-06-04 LAB — COMPREHENSIVE METABOLIC PANEL
ALT: 23 U/L (ref 0–53)
AST: 23 U/L (ref 0–37)
Albumin: 4.3 g/dL (ref 3.5–5.2)
Alkaline Phosphatase: 75 U/L (ref 39–117)
BUN: 27 mg/dL — ABNORMAL HIGH (ref 6–23)
CO2: 28 meq/L (ref 19–32)
Calcium: 9.7 mg/dL (ref 8.4–10.5)
Chloride: 103 meq/L (ref 96–112)
Creatinine, Ser: 1.23 mg/dL (ref 0.40–1.50)
GFR: 55.12 mL/min — ABNORMAL LOW (ref >60.00)
Glucose, Bld: 96 mg/dL (ref 70–99)
Potassium: 4.4 meq/L (ref 3.5–5.1)
Sodium: 138 meq/L (ref 135–145)
Total Bilirubin: 0.7 mg/dL (ref 0.2–1.2)
Total Protein: 7.1 g/dL (ref 6.0–8.3)

## 2023-06-04 LAB — LIPOPROTEIN A (LPA): Lipoprotein (a): 12.1 nmol/L (ref <75.0)

## 2023-06-05 ENCOUNTER — Ambulatory Visit (HOSPITAL_COMMUNITY)
Admission: RE | Admit: 2023-06-05 | Discharge: 2023-06-05 | Disposition: A | Discharge status: Home / Self Care | Payer: Medicare Other | Source: Ambulatory Visit | Attending: Cardiovascular Disease | Admitting: Cardiovascular Disease

## 2023-06-05 DIAGNOSIS — R931 Abnormal findings on diagnostic imaging of heart and coronary circulation: Secondary | ICD-10-CM | POA: Insufficient documentation

## 2023-06-05 DIAGNOSIS — R9431 Abnormal electrocardiogram [ECG] [EKG]: Secondary | ICD-10-CM | POA: Diagnosis not present

## 2023-06-05 DIAGNOSIS — I4510 Unspecified right bundle-branch block: Secondary | ICD-10-CM | POA: Insufficient documentation

## 2023-06-05 DIAGNOSIS — I2510 Atherosclerotic heart disease of native coronary artery without angina pectoris: Secondary | ICD-10-CM | POA: Diagnosis not present

## 2023-06-05 DIAGNOSIS — R0609 Other forms of dyspnea: Secondary | ICD-10-CM | POA: Diagnosis not present

## 2023-06-05 MED ORDER — NITROGLYCERIN 0.4 MG SL SUBL
SUBLINGUAL_TABLET | SUBLINGUAL | Status: AC
  Filled 2023-06-05: qty 2

## 2023-06-05 MED ORDER — IOHEXOL 350 MG/ML SOLN
95.0000 mL | Freq: Once | INTRAVENOUS | Status: AC | PRN
  Administered 2023-06-05: 95 mL via INTRAVENOUS

## 2023-06-05 MED ORDER — NITROGLYCERIN 0.4 MG SL SUBL
0.8000 mg | SUBLINGUAL_TABLET | Freq: Once | SUBLINGUAL | Status: AC
  Administered 2023-06-05: 0.8 mg via SUBLINGUAL

## 2023-06-06 ENCOUNTER — Other Ambulatory Visit: Payer: Self-pay | Admitting: Cardiology

## 2023-06-06 ENCOUNTER — Ambulatory Visit (HOSPITAL_BASED_OUTPATIENT_CLINIC_OR_DEPARTMENT_OTHER)
Admission: RE | Admit: 2023-06-06 | Discharge: 2023-06-06 | Disposition: A | Discharge status: Home / Self Care | Payer: Medicare Other | Source: Ambulatory Visit | Attending: Cardiology | Admitting: Cardiology

## 2023-06-06 DIAGNOSIS — R931 Abnormal findings on diagnostic imaging of heart and coronary circulation: Secondary | ICD-10-CM | POA: Diagnosis not present

## 2023-06-06 DIAGNOSIS — R0609 Other forms of dyspnea: Secondary | ICD-10-CM | POA: Diagnosis not present

## 2023-06-06 DIAGNOSIS — I4510 Unspecified right bundle-branch block: Secondary | ICD-10-CM | POA: Diagnosis not present

## 2023-06-06 DIAGNOSIS — R9431 Abnormal electrocardiogram [ECG] [EKG]: Secondary | ICD-10-CM | POA: Diagnosis not present

## 2023-06-07 ENCOUNTER — Encounter (HOSPITAL_BASED_OUTPATIENT_CLINIC_OR_DEPARTMENT_OTHER): Payer: Self-pay | Admitting: Cardiovascular Disease

## 2023-06-07 DIAGNOSIS — Z5181 Encounter for therapeutic drug level monitoring: Secondary | ICD-10-CM

## 2023-06-07 DIAGNOSIS — E785 Hyperlipidemia, unspecified: Secondary | ICD-10-CM

## 2023-06-07 DIAGNOSIS — I10 Essential (primary) hypertension: Secondary | ICD-10-CM

## 2023-06-10 MED ORDER — ROSUVASTATIN CALCIUM 10 MG PO TABS: 10.0000 mg | ORAL_TABLET | Freq: Every day | ORAL | 3 refills | Status: DC

## 2023-06-12 DIAGNOSIS — Z9889 Other specified postprocedural states: Secondary | ICD-10-CM | POA: Diagnosis not present

## 2023-06-12 DIAGNOSIS — G250 Essential tremor: Secondary | ICD-10-CM | POA: Diagnosis not present

## 2023-06-12 DIAGNOSIS — I2510 Atherosclerotic heart disease of native coronary artery without angina pectoris: Secondary | ICD-10-CM | POA: Diagnosis not present

## 2023-06-12 DIAGNOSIS — H532 Diplopia: Secondary | ICD-10-CM | POA: Diagnosis not present

## 2023-06-12 DIAGNOSIS — E039 Hypothyroidism, unspecified: Secondary | ICD-10-CM | POA: Diagnosis not present

## 2023-06-12 DIAGNOSIS — G4730 Sleep apnea, unspecified: Secondary | ICD-10-CM | POA: Diagnosis not present

## 2023-06-12 DIAGNOSIS — H5022 Vertical strabismus, left eye: Secondary | ICD-10-CM | POA: Diagnosis not present

## 2023-06-12 DIAGNOSIS — Z79899 Other long term (current) drug therapy: Secondary | ICD-10-CM | POA: Diagnosis not present

## 2023-06-12 DIAGNOSIS — Z7982 Long term (current) use of aspirin: Secondary | ICD-10-CM | POA: Diagnosis not present

## 2023-06-12 DIAGNOSIS — I10 Essential (primary) hypertension: Secondary | ICD-10-CM | POA: Diagnosis not present

## 2023-06-12 DIAGNOSIS — Z87891 Personal history of nicotine dependence: Secondary | ICD-10-CM | POA: Diagnosis not present

## 2023-06-13 DIAGNOSIS — Z9889 Other specified postprocedural states: Secondary | ICD-10-CM | POA: Diagnosis not present

## 2023-06-13 DIAGNOSIS — H5022 Vertical strabismus, left eye: Secondary | ICD-10-CM | POA: Diagnosis not present

## 2023-06-24 ENCOUNTER — Other Ambulatory Visit: Payer: Self-pay | Admitting: Internal Medicine

## 2023-07-02 DIAGNOSIS — H4421 Degenerative myopia, right eye: Secondary | ICD-10-CM | POA: Diagnosis not present

## 2023-07-02 DIAGNOSIS — H35373 Puckering of macula, bilateral: Secondary | ICD-10-CM | POA: Diagnosis not present

## 2023-07-02 DIAGNOSIS — H59812 Chorioretinal scars after surgery for detachment, left eye: Secondary | ICD-10-CM | POA: Diagnosis not present

## 2023-07-02 DIAGNOSIS — H353221 Exudative age-related macular degeneration, left eye, with active choroidal neovascularization: Secondary | ICD-10-CM | POA: Diagnosis not present

## 2023-07-16 ENCOUNTER — Ambulatory Visit (INDEPENDENT_AMBULATORY_CARE_PROVIDER_SITE_OTHER): Payer: Medicare Other | Admitting: Internal Medicine

## 2023-07-16 ENCOUNTER — Encounter: Payer: Self-pay | Admitting: Internal Medicine

## 2023-07-16 VITALS — BP 108/60 | HR 69 | Temp 97.8°F | Ht 74.0 in | Wt 177.0 lb

## 2023-07-16 DIAGNOSIS — E079 Disorder of thyroid, unspecified: Secondary | ICD-10-CM

## 2023-07-16 DIAGNOSIS — R2689 Other abnormalities of gait and mobility: Secondary | ICD-10-CM | POA: Diagnosis not present

## 2023-07-16 DIAGNOSIS — I10 Essential (primary) hypertension: Secondary | ICD-10-CM | POA: Diagnosis not present

## 2023-07-16 DIAGNOSIS — R0609 Other forms of dyspnea: Secondary | ICD-10-CM

## 2023-07-16 DIAGNOSIS — E034 Atrophy of thyroid (acquired): Secondary | ICD-10-CM | POA: Diagnosis not present

## 2023-07-16 DIAGNOSIS — H0589 Other disorders of orbit: Secondary | ICD-10-CM

## 2023-07-16 MED ORDER — PANTOPRAZOLE SODIUM 40 MG PO TBEC: 40.0000 mg | DELAYED_RELEASE_TABLET | Freq: Every day | ORAL | 1 refills | Status: DC

## 2023-07-16 MED ORDER — LORAZEPAM 0.5 MG PO TABS: ORAL_TABLET | ORAL | 3 refills | Status: DC

## 2023-07-16 NOTE — Assessment & Plan Note (Signed)
 Check TSH, FT4, FT3 Repeat CMET

## 2023-07-16 NOTE — Progress Notes (Signed)
Subjective:  Patient ID: David Buck, male    DOB: 1941/03/01  Age: 83 y.o. MRN: [id number]  CC: Medical Management of Chronic Issues (3 MNTH F/U)   HPI David Buck presents for fatigue, GERD sx's - he did not take Pantoprazole for ?reason C/o DOE - chronic Bloating is much better  Outpatient Medications Prior to Visit  Medication Sig Dispense Refill   ascorbic acid (VITAMIN C) 500 MG tablet Take 500 mg by mouth daily.     buPROPion (WELLBUTRIN XL) 150 MG 24 hr tablet TAKE 1 TABLET BY MOUTH DAILY. MUST SEE PROVIDER FOR FURTHER REFILLS 90 tablet 1   Cholecalciferol (VITAMIN D3) 50 MCG (2000 UT) TABS Take 2,000 Units by mouth every evening.     faricimab-svoa (VABYSMO) 6 MG/0.05ML SOLN intravitreal injection 6 mg by Intravitreal route every 30 (thirty) days. Once every 4 weeks.     GLUCOSAMINE HCL PO Take 1,000 mg by mouth daily.     loratadine (CLARITIN) 10 MG tablet Take 1 tablet by mouth daily as needed for allergies.     metroNIDAZOLE (FLAGYL) 500 MG tablet Take 1 tablet (500 mg total) by mouth 3 (three) times daily. 30 tablet 0   Multiple Vitamin (MULTIVITAMIN WITH MINERALS) TABS tablet Take 1 tablet by mouth every evening.     olmesartan (BENICAR) 20 MG tablet TAKE 1 TABLET BY MOUTH DAILY 90 tablet 3   Polyethyl Glycol-Propyl Glycol (SYSTANE ULTRA) 0.4-0.3 % SOLN Place 1 drop into both eyes every evening.     Probiotic Product (ALIGN) 4 MG CAPS Take 1 capsule (4 mg total) by mouth daily. 30 capsule 0   rosuvastatin (CRESTOR) 10 MG tablet Take 1 tablet (10 mg total) by mouth daily. 90 tablet 3   ZINC PICOLINATE PO Take 22 mg by mouth every evening.     LORazepam (ATIVAN) 0.5 MG tablet TAKE ONE TABLET BY MOUTH TWICE A DAY AS NEEDED FOR ANXIETY 60 tablet 3   ondansetron (ZOFRAN) 4 MG tablet Take 1 tablet (4 mg total) by mouth every 8 (eight) hours as needed for nausea or vomiting. (Patient not taking: Reported on 05/20/2023) 20 tablet 0   tadalafil (CIALIS) 20 MG tablet Take  1 tablet (20 mg total) by mouth daily as needed for erectile dysfunction. 20 tablet 3   metoprolol tartrate (LOPRESSOR) 25 MG tablet Take 1 tablet (25 mg) TWO hours prior to CT scan 1 tablet 0   No facility-administered medications prior to visit.    ROS: Review of Systems  Constitutional:  Negative for appetite change, fatigue and unexpected weight change.  HENT:  Negative for congestion, nosebleeds, sneezing, sore throat and trouble swallowing.   Eyes:  Negative for itching and visual disturbance.  Respiratory:  Negative for cough.   Cardiovascular:  Negative for chest pain, palpitations and leg swelling.  Gastrointestinal:  Positive for abdominal distention. Negative for abdominal pain, blood in stool, diarrhea, nausea and vomiting.  Genitourinary:  Negative for frequency and hematuria.  Musculoskeletal:  Negative for back pain, gait problem, joint swelling and neck pain.  Skin:  Negative for rash.  Neurological:  Negative for dizziness, tremors, speech difficulty and weakness.  Psychiatric/Behavioral:  Negative for agitation, dysphoric mood, sleep disturbance and suicidal ideas. The patient is nervous/anxious.     Objective:  BP 108/60 (BP Location: Left Arm, Patient Position: Sitting, Cuff Size: Normal)   Pulse 69   Temp 97.8 F (36.6 C) (Oral)   Ht 6\' 2"  (1.88 m)   Wt 177  lb (80.3 kg)   SpO2 95%   BMI 22.73 kg/m   BP Readings from Last 3 Encounters:  07/16/23 108/60  06/05/23 113/68  05/21/23 126/80    Wt Readings from Last 3 Encounters:  07/16/23 177 lb (80.3 kg)  05/21/23 183 lb 6.4 oz (83.2 kg)  05/20/23 183 lb 9.6 oz (83.3 kg)    Physical Exam Constitutional:      General: He is not in acute distress.    Appearance: He is well-developed.     Comments: NAD  Eyes:     Conjunctiva/sclera: Conjunctivae normal.     Pupils: Pupils are equal, round, and reactive to light.  Neck:     Thyroid: No thyromegaly.     Vascular: No JVD.  Cardiovascular:     Rate and  Rhythm: Normal rate and regular rhythm.     Heart sounds: Normal heart sounds. No murmur heard.    No friction rub. No gallop.  Pulmonary:     Effort: Pulmonary effort is normal. No respiratory distress.     Breath sounds: Normal breath sounds. No wheezing or rales.  Chest:     Chest wall: No tenderness.  Abdominal:     General: Bowel sounds are normal. There is no distension.     Palpations: Abdomen is soft. There is no mass.     Tenderness: There is no abdominal tenderness. There is no guarding or rebound.  Musculoskeletal:        General: No tenderness. Normal range of motion.     Cervical back: Normal range of motion.  Lymphadenopathy:     Cervical: No cervical adenopathy.  Skin:    General: Skin is warm and dry.     Findings: No rash.  Neurological:     Mental Status: He is alert and oriented to person, place, and time.     Cranial Nerves: No cranial nerve deficit.     Motor: No abnormal muscle tone.     Coordination: Coordination normal.     Gait: Gait normal.     Deep Tendon Reflexes: Reflexes are normal and symmetric.  Psychiatric:        Behavior: Behavior normal.        Thought Content: Thought content normal.        Judgment: Judgment normal.     Lab Results  Component Value Date   WBC 6.8 04/30/2023   HGB 13.1 04/30/2023   HCT 40.6 04/30/2023   PLT 295.0 04/30/2023   GLUCOSE 96 06/03/2023   CHOL 144 04/30/2023   TRIG 48.0 04/30/2023   HDL 63.10 04/30/2023   LDLCALC 71 04/30/2023   ALT 23 06/03/2023   AST 23 06/03/2023   NA 138 06/03/2023   K 4.4 06/03/2023   CL 103 06/03/2023   CREATININE 1.23 06/03/2023   BUN 27 (H) 06/03/2023   CO2 28 06/03/2023   TSH 7.97 (H) 06/03/2023   PSA 3.34 04/30/2023   INR 1.0 08/29/2022   HGBA1C 5.8 04/30/2023    CT CORONARY FFR DATA PREP & FLUID ANALYSIS Result Date: 06/06/2023 EXAM: CT FFR ANALYSIS CLINICAL DATA:  ECG abnormal, intermediate CAD risk Dyspnea on exertion (DOE) FINDINGS: FFRct analysis was performed  on the original cardiac CT angiogram dataset. Diagrammatic representation of the FFRct analysis is provided in a separate PDF document in PACS. This dictation was created using the PDF document and an interactive 3D model of the results. 3D model is not available in the EMR/PACS. Normal FFR range is >0.80. Indeterminate (  grey) zone is 0.76-0.80. 1. Left Main: FFR = 0.99 2. LAD: Proximal FFR = 0.97, mid FFR = 0.93, distal FFR = 0.89 3. D1: Proximal FFR = 0.95, distal FFR = 0.88 4. LCX: Proximal FFR = 0.99, distal FFR = 0.85 5. RCA: Proximal FFR = 0.99, mid FFR =0.93, distal FFR = 0.93 IMPRESSION: 1.  CT FFR analysis showed no significant stenosis. RECOMMENDATIONS: Goal directed medical therapy and aggressive risk factor modification for secondary prevention of coronary artery disease. Electronically Signed   By: Tessa Lerner D.O.   On: 06/06/2023 14:29    Assessment & Plan:   Problem List Items Addressed This Visit     Hypothyroidism - Primary   Check TSH, FT4, FT3 Repeat CMET      Relevant Orders   T3, free   T4, free   TSH   Comprehensive metabolic panel   Essential hypertension    On Benicar      Thyroid orbitopathy   S/p recent strabismus surgery in 05/2023      Relevant Orders   Comprehensive metabolic panel   Balance disorder   S/p recent strabismus surgery in 05/2023      DOE (dyspnea on exertion)   David Buck is walking very little. Pt is seeing Dr Maple Hudson Try to exercise more         Meds ordered this encounter  Medications   LORazepam (ATIVAN) 0.5 MG tablet    Sig: TAKE ONE TABLET BY MOUTH TWICE A DAY AS NEEDED FOR ANXIETY    Dispense:  60 tablet    Refill:  3   pantoprazole (PROTONIX) 40 MG tablet    Sig: Take 1 tablet (40 mg total) by mouth daily.    Dispense:  90 tablet    Refill:  1      Follow-up: Return in about 3 months (around 10/14/2023) for a follow-up visit.  Sonda Primes, MD

## 2023-07-16 NOTE — Assessment & Plan Note (Signed)
S/p recent strabismus surgery in 05/2023

## 2023-07-16 NOTE — Progress Notes (Signed)
Subjective:  Patient ID: David Buck, male    DOB: Dec 01, 1940  Age: 83 y.o. MRN: [id number]  CC: Medical Management of Chronic Issues (3 MNTH F/U)   HPI David Buck presents for fatigue, GERD sx's - he did not take Pantoprazole for ?reason C/o DOE - chronic Bloating is much better  Outpatient Medications Prior to Visit  Medication Sig Dispense Refill   ascorbic acid (VITAMIN C) 500 MG tablet Take 500 mg by mouth daily.     buPROPion (WELLBUTRIN XL) 150 MG 24 hr tablet TAKE 1 TABLET BY MOUTH DAILY. MUST SEE PROVIDER FOR FURTHER REFILLS 90 tablet 1   Cholecalciferol (VITAMIN D3) 50 MCG (2000 UT) TABS Take 2,000 Units by mouth every evening.     faricimab-svoa (VABYSMO) 6 MG/0.05ML SOLN intravitreal injection 6 mg by Intravitreal route every 30 (thirty) days. Once every 4 weeks.     GLUCOSAMINE HCL PO Take 1,000 mg by mouth daily.     loratadine (CLARITIN) 10 MG tablet Take 1 tablet by mouth daily as needed for allergies.     metroNIDAZOLE (FLAGYL) 500 MG tablet Take 1 tablet (500 mg total) by mouth 3 (three) times daily. 30 tablet 0   Multiple Vitamin (MULTIVITAMIN WITH MINERALS) TABS tablet Take 1 tablet by mouth every evening.     olmesartan (BENICAR) 20 MG tablet TAKE 1 TABLET BY MOUTH DAILY 90 tablet 3   Polyethyl Glycol-Propyl Glycol (SYSTANE ULTRA) 0.4-0.3 % SOLN Place 1 drop into both eyes every evening.     Probiotic Product (ALIGN) 4 MG CAPS Take 1 capsule (4 mg total) by mouth daily. 30 capsule 0   rosuvastatin (CRESTOR) 10 MG tablet Take 1 tablet (10 mg total) by mouth daily. 90 tablet 3   ZINC PICOLINATE PO Take 22 mg by mouth every evening.     LORazepam (ATIVAN) 0.5 MG tablet TAKE ONE TABLET BY MOUTH TWICE A DAY AS NEEDED FOR ANXIETY 60 tablet 3   ondansetron (ZOFRAN) 4 MG tablet Take 1 tablet (4 mg total) by mouth every 8 (eight) hours as needed for nausea or vomiting. (Patient not taking: Reported on 05/20/2023) 20 tablet 0   tadalafil (CIALIS) 20 MG tablet Take  1 tablet (20 mg total) by mouth daily as needed for erectile dysfunction. 20 tablet 3   metoprolol tartrate (LOPRESSOR) 25 MG tablet Take 1 tablet (25 mg) TWO hours prior to CT scan 1 tablet 0   No facility-administered medications prior to visit.    ROS: Review of Systems  Constitutional:  Negative for appetite change, fatigue and unexpected weight change.  HENT:  Negative for congestion, nosebleeds, sneezing, sore throat and trouble swallowing.   Eyes:  Negative for itching and visual disturbance.  Respiratory:  Negative for cough.   Cardiovascular:  Negative for chest pain, palpitations and leg swelling.  Gastrointestinal:  Positive for abdominal distention. Negative for abdominal pain, blood in stool, diarrhea, nausea and vomiting.  Genitourinary:  Negative for frequency and hematuria.  Musculoskeletal:  Negative for back pain, gait problem, joint swelling and neck pain.  Skin:  Negative for rash.  Neurological:  Negative for dizziness, tremors, speech difficulty and weakness.  Psychiatric/Behavioral:  Negative for agitation, dysphoric mood, sleep disturbance and suicidal ideas. The patient is nervous/anxious.     Objective:  BP 108/60 (BP Location: Left Arm, Patient Position: Sitting, Cuff Size: Normal)   Pulse 69   Temp 97.8 F (36.6 C) (Oral)   Ht 6\' 2"  (1.88 m)   Wt 177  lb (80.3 kg)   SpO2 95%   BMI 22.73 kg/m   BP Readings from Last 3 Encounters:  07/16/23 108/60  06/05/23 113/68  05/21/23 126/80    Wt Readings from Last 3 Encounters:  07/16/23 177 lb (80.3 kg)  05/21/23 183 lb 6.4 oz (83.2 kg)  05/20/23 183 lb 9.6 oz (83.3 kg)    Physical Exam Constitutional:      General: He is not in acute distress.    Appearance: He is well-developed.     Comments: NAD  Eyes:     Conjunctiva/sclera: Conjunctivae normal.     Pupils: Pupils are equal, round, and reactive to light.  Neck:     Thyroid: No thyromegaly.     Vascular: No JVD.  Cardiovascular:     Rate and  Rhythm: Normal rate and regular rhythm.     Heart sounds: Normal heart sounds. No murmur heard.    No friction rub. No gallop.  Pulmonary:     Effort: Pulmonary effort is normal. No respiratory distress.     Breath sounds: Normal breath sounds. No wheezing or rales.  Chest:     Chest wall: No tenderness.  Abdominal:     General: Bowel sounds are normal. There is no distension.     Palpations: Abdomen is soft. There is no mass.     Tenderness: There is no abdominal tenderness. There is no guarding or rebound.  Musculoskeletal:        General: No tenderness. Normal range of motion.     Cervical back: Normal range of motion.  Lymphadenopathy:     Cervical: No cervical adenopathy.  Skin:    General: Skin is warm and dry.     Findings: No rash.  Neurological:     Mental Status: He is alert and oriented to person, place, and time.     Cranial Nerves: No cranial nerve deficit.     Motor: No abnormal muscle tone.     Coordination: Coordination normal.     Gait: Gait normal.     Deep Tendon Reflexes: Reflexes are normal and symmetric.  Psychiatric:        Behavior: Behavior normal.        Thought Content: Thought content normal.        Judgment: Judgment normal.     Lab Results  Component Value Date   WBC 6.8 04/30/2023   HGB 13.1 04/30/2023   HCT 40.6 04/30/2023   PLT 295.0 04/30/2023   GLUCOSE 96 06/03/2023   CHOL 144 04/30/2023   TRIG 48.0 04/30/2023   HDL 63.10 04/30/2023   LDLCALC 71 04/30/2023   ALT 23 06/03/2023   AST 23 06/03/2023   NA 138 06/03/2023   K 4.4 06/03/2023   CL 103 06/03/2023   CREATININE 1.23 06/03/2023   BUN 27 (H) 06/03/2023   CO2 28 06/03/2023   TSH 7.97 (H) 06/03/2023   PSA 3.34 04/30/2023   INR 1.0 08/29/2022   HGBA1C 5.8 04/30/2023    CT CORONARY FFR DATA PREP & FLUID ANALYSIS Result Date: 06/06/2023 EXAM: CT FFR ANALYSIS CLINICAL DATA:  ECG abnormal, intermediate CAD risk Dyspnea on exertion (DOE) FINDINGS: FFRct analysis was performed  on the original cardiac CT angiogram dataset. Diagrammatic representation of the FFRct analysis is provided in a separate PDF document in PACS. This dictation was created using the PDF document and an interactive 3D model of the results. 3D model is not available in the EMR/PACS. Normal FFR range is >0.80. Indeterminate (  grey) zone is 0.76-0.80. 1. Left Main: FFR = 0.99 2. LAD: Proximal FFR = 0.97, mid FFR = 0.93, distal FFR = 0.89 3. D1: Proximal FFR = 0.95, distal FFR = 0.88 4. LCX: Proximal FFR = 0.99, distal FFR = 0.85 5. RCA: Proximal FFR = 0.99, mid FFR =0.93, distal FFR = 0.93 IMPRESSION: 1.  CT FFR analysis showed no significant stenosis. RECOMMENDATIONS: Goal directed medical therapy and aggressive risk factor modification for secondary prevention of coronary artery disease. Electronically Signed   By: Tessa Lerner D.O.   On: 06/06/2023 14:29    Assessment & Plan:   Problem List Items Addressed This Visit     Hypothyroidism - Primary   Check TSH, FT4, FT3 Repeat CMET      Essential hypertension    On Benicar      Thyroid orbitopathy   S/p recent strabismus surgery in 05/2023      Balance disorder   S/p recent strabismus surgery in 05/2023      DOE (dyspnea on exertion)   David Buck is walking very little. Pt is seeing Dr Maple Hudson Try to exercise more         Meds ordered this encounter  Medications   LORazepam (ATIVAN) 0.5 MG tablet    Sig: TAKE ONE TABLET BY MOUTH TWICE A DAY AS NEEDED FOR ANXIETY    Dispense:  60 tablet    Refill:  3   pantoprazole (PROTONIX) 40 MG tablet    Sig: Take 1 tablet (40 mg total) by mouth daily.    Dispense:  90 tablet    Refill:  1      Follow-up: Return in about 3 months (around 10/14/2023) for a follow-up visit.  Sonda Primes, MD

## 2023-07-16 NOTE — Assessment & Plan Note (Signed)
 On Benicar

## 2023-07-16 NOTE — Assessment & Plan Note (Signed)
David Buck is walking very little. Pt is seeing Dr Maple Hudson Try to exercise more

## 2023-07-30 DIAGNOSIS — H353221 Exudative age-related macular degeneration, left eye, with active choroidal neovascularization: Secondary | ICD-10-CM | POA: Diagnosis not present

## 2023-08-20 ENCOUNTER — Ambulatory Visit
Admission: RE | Admit: 2023-08-20 | Discharge: 2023-08-20 | Disposition: A | Discharge status: Home / Self Care | Payer: Medicare Other | Source: Ambulatory Visit | Attending: Internal Medicine | Admitting: Internal Medicine

## 2023-08-20 DIAGNOSIS — A310 Pulmonary mycobacterial infection: Secondary | ICD-10-CM

## 2023-08-20 DIAGNOSIS — A312 Disseminated mycobacterium avium-intracellulare complex (DMAC): Secondary | ICD-10-CM | POA: Diagnosis not present

## 2023-08-20 DIAGNOSIS — R918 Other nonspecific abnormal finding of lung field: Secondary | ICD-10-CM

## 2023-08-20 DIAGNOSIS — R911 Solitary pulmonary nodule: Secondary | ICD-10-CM | POA: Diagnosis not present

## 2023-08-20 DIAGNOSIS — I2510 Atherosclerotic heart disease of native coronary artery without angina pectoris: Secondary | ICD-10-CM | POA: Diagnosis not present

## 2023-08-20 DIAGNOSIS — I700 Atherosclerosis of aorta: Secondary | ICD-10-CM | POA: Diagnosis not present

## 2023-08-21 ENCOUNTER — Telehealth: Payer: Self-pay | Admitting: *Deleted

## 2023-08-21 NOTE — Telephone Encounter (Signed)
 Spoke with reading room to finalize patients CT scan before follow up appt. Tomorrow with Dr. Jetty Duhamel.

## 2023-08-21 NOTE — Progress Notes (Signed)
 Subjective:    Patient ID: David Buck, male    DOB: [birth year], 83 y.o.   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 Bronchoscopy (Dr Everardo All) 04/14/20- BAL-Neg Fungal, Neg AFB, Pos Pseudomonas broadly sens. Had doxycycline prior to procedure, then Levaquin 500 mg x 7 days after. Cytology neg. Lung Bx Positibve MAIC 2024 PFT 07/12/22- Resection lung nodule 08/29/22- > +MAIC. 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 No active disease as of CT 01/30/23. We will follow this for activity- updating CT in 6 months. He noticed he was quite short of breath for a few days right after surgery, but that has resolved back to baseline.  He is not very active.  I encouraged him to walk for endurance.  He is going to a regular exercise program.  No cough or wheeze.  No phlegm.  He did not like Bevespi-made him lightheaded and felt "odd".  We are going to let him try  sample Spiriva for comparison.   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 Neurology/ [facility] following for OSA (HST 12/21/21 AHI 16.1, desat to 80%> CPAP)  Severe emphysema, stable scarring on cardiac CT in December. Wife and daughter Conservation officer, nature) here today CT chest 08/19/25 IMPRESSION: 1. 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 2. Increased tiny peripheral tree-in-bud nodules within the posterior and lateral right lung base. Findings are compatible with atypical infection such as MAI. 3. Stable tiny nodule within the periphery of the left apex measuring 3 mm. 4. Coronary artery calcifications. 5. Aortic Atherosclerosis (ICD10-I70.0) and Emphysema (ICD10-J43.9). Discussed the use of AI scribe software for clinical note transcription with the patient, who gave verbal consent to proceed.  History of Present Illness   The patient, with a history of thoracic surgery/ wedge resections which identified MAIC, and COPD/emphysema, presents with shortness of breath. He reports that the shortness of breath began after his thoracic surgery and has persisted since. He also notes that he feels more short of breath when his stomach is full. The patient's pulmonary function test results from a year ago indicate moderate obstructive airways disease and severe emphysema is noted on CT. The patient is  currently using Spiriva Respimat, an inhaler,  which he reports does not cause any adverse reactions. 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 He denies any cough or night sweats. He gets very little physical exercise.       ROS-see HPI  + = positive Constitutional:   No-   weight loss, night sweats, fevers, chills, fatigue, lassitude. HEENT:   No-  headaches, difficulty swallowing, tooth/dental problems, sore throat,       No-  sneezing, itching, ear ache, +nasal congestion, +post nasal drip,  CV:  No-   chest pain, orthopnea, PND, swelling in lower extremities, anasarca, dizziness, palpitations Resp: +  shortness of breath with exertion or at rest.             productive cough,  No- non-productive cough,  coughing up of blood.              No-   change in color of mucus.  No- wheezing.   Skin: No-   rash or lesions. GI:  No-   heartburn, indigestion, abdominal pain, nausea, vomiting,  GU:  MS:  No-   joint pain or swelling.  Neuro-     nothing unusual Psych:  No- change in mood or affect. No depression or anxiety.  No memory loss.  Objective:   Physical Exam General- Alert, Oriented, Affect-appropriate, Distress- none acute.+ tall slender man Skin- rash-none, lesions- none, excoriation- none Lymphadenopathy- none Head- atraumatic            Eyes-             Ears- Hearing, canals normal            Nose- Clear, No- Septal dev, +mucus postnasal drip, polyps, erosion, perforation             Throat- Mallampati II , mucosa clear , drainage- none, tonsils- atrophic  dentures Neck- flexible , trachea midline, no stridor , thyroid nl, carotid no bruit Chest - symmetrical excursion , unlabored           Heart/CV- RRR , no murmur , no gallop  , no rub, nl s1 s2                           - JVD- none , edema- none, stasis changes- none, varices- none           Lung- Clear, unlabored, wheeze- none, cough-none, dullness-none, rub- none            Chest wall-  Abd-  Br/ Gen/ Rectal- Not done, not indicated Extrem- cyanosis- none, clubbing, none, atrophy- none, strength- nl Neuro-+ mild head bobbing tremor    Assessment & Plan:  Assessment and Plan    COPD/Emphysema Shortness of breath, especially after eating or bending over. Pulmonary function test a year ago showed moderate obstructive airways disease. Recent CT scan shows severe emphysema. Patient has been trialing Spiriva Respimat with some perceived benefit. -Continue Spiriva Respimat as needed, with instructions to use consistently for a few days, then stop for a few days to assess for any noticeable difference in symptoms. -Encourage regular physical activity to maintain endurance and muscle strength.  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 Recent CT scan shows some areas of inflammation in the right lung base, suggesting possible active infection. -Refer to Infectious Disease specialists at [facility] for evaluation and potential treatment. -Consider long-term multi-drug antibiotic regimen if deemed necessary by  Infectious Disease specialist.  Follow-up in 6 months to reassess symptoms and response to treatment.

## 2023-08-22 ENCOUNTER — Encounter: Payer: Self-pay | Admitting: Internal Medicine

## 2023-08-22 ENCOUNTER — Ambulatory Visit: Payer: Medicare Other | Admitting: Internal Medicine

## 2023-08-22 VITALS — BP 100/78 | HR 69 | Temp 97.7°F | Ht 74.0 in | Wt 184.0 lb

## 2023-08-22 DIAGNOSIS — J449 Chronic obstructive pulmonary disease, unspecified: Secondary | ICD-10-CM | POA: Diagnosis not present

## 2023-08-22 DIAGNOSIS — A310 Pulmonary mycobacterial infection: Secondary | ICD-10-CM | POA: Diagnosis not present

## 2023-08-22 NOTE — Patient Instructions (Signed)
 Order- referral to Infectious Disease    dx Pulmonary MAC infection

## 2023-08-23 ENCOUNTER — Other Ambulatory Visit: Payer: Medicare Other

## 2023-08-26 ENCOUNTER — Encounter: Payer: Self-pay | Admitting: Internal Medicine

## 2023-08-28 DIAGNOSIS — H353221 Exudative age-related macular degeneration, left eye, with active choroidal neovascularization: Secondary | ICD-10-CM | POA: Diagnosis not present

## 2023-08-28 DIAGNOSIS — H43392 Other vitreous opacities, left eye: Secondary | ICD-10-CM | POA: Diagnosis not present

## 2023-08-28 DIAGNOSIS — H35373 Puckering of macula, bilateral: Secondary | ICD-10-CM | POA: Diagnosis not present

## 2023-08-28 DIAGNOSIS — H26492 Other secondary cataract, left eye: Secondary | ICD-10-CM | POA: Diagnosis not present

## 2023-08-28 DIAGNOSIS — H43813 Vitreous degeneration, bilateral: Secondary | ICD-10-CM | POA: Diagnosis not present

## 2023-08-29 DIAGNOSIS — L578 Other skin changes due to chronic exposure to nonionizing radiation: Secondary | ICD-10-CM | POA: Diagnosis not present

## 2023-08-29 DIAGNOSIS — D1801 Hemangioma of skin and subcutaneous tissue: Secondary | ICD-10-CM | POA: Diagnosis not present

## 2023-08-29 DIAGNOSIS — L738 Other specified follicular disorders: Secondary | ICD-10-CM | POA: Diagnosis not present

## 2023-08-30 DIAGNOSIS — H353221 Exudative age-related macular degeneration, left eye, with active choroidal neovascularization: Secondary | ICD-10-CM | POA: Diagnosis not present

## 2023-09-05 DIAGNOSIS — K4091 Unilateral inguinal hernia, without obstruction or gangrene, recurrent: Secondary | ICD-10-CM | POA: Diagnosis not present

## 2023-09-05 DIAGNOSIS — R1032 Left lower quadrant pain: Secondary | ICD-10-CM | POA: Diagnosis not present

## 2023-09-10 ENCOUNTER — Ambulatory Visit (INDEPENDENT_AMBULATORY_CARE_PROVIDER_SITE_OTHER): Admitting: Sports Medicine

## 2023-09-10 ENCOUNTER — Encounter: Payer: Self-pay | Admitting: Sports Medicine

## 2023-09-10 VITALS — BP 130/80 | Ht 74.0 in | Wt 184.0 lb

## 2023-09-10 DIAGNOSIS — M25552 Pain in left hip: Secondary | ICD-10-CM | POA: Diagnosis present

## 2023-09-10 NOTE — Progress Notes (Signed)
   Subjective:    Patient ID: David Buck, male    DOB: Aug 13, 1940, 83 y.o.   MRN: [id number]  HPI chief complaint: Left groin pain  David Buck presents today with his wife to discuss left groin pain.  He has had intermittent pain for several months.  He has a history of bilateral inguinal hernia repairs and initially thought that his pain may be coming from a new hernia.  He was evaluated by Dr. Andrey Campanile and a CT scan showed a very small left-sided hernia containing only a small amount of fat.  Dr. Andrey Campanile then recommended consultation with me to rule out musculoskeletal causes of his groin pain.  He states that he will have an occasional twinge of left groin pain that occurs every 7 to 10 days and lasts only a couple of hours.  However, recently he had an episode that lasted a couple of days and was very uncomfortable.  That is what led to the appointment with Dr. Andrey Campanile.  I was able to personally review the CT scan of his pelvis which shows a very mild amount of arthritis in both hips.  He denies radiating pain.  Past medical history reviewed Medications reviewed Allergies reviewed   Review of Systems As above    Objective:   Physical Exam  Well-developed, well-nourished.  No acute distress  Left hip: Smooth painless hip range of motion with a negative logroll.  No tenderness to palpation.  Negative Faber negative FADIR.  Neurovascularly intact distally.      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  The best way to diagnose and rule out intra-articular hip pathology is with a diagnostic cortisone injection.  However, currently he is not in any pain.  I have recommended that he contact our [facility] office immediately if he has severe pain that returns and  lasts more than a day or so and  a diagnostic ultrasound-guided hip injection can be expedited.  But if his symptoms return to his baseline of a single 2-hour episode every 7 to 10 days then I would recommend no further workup or treatment at all.  Follow-up as needed.  This note was dictated using Dragon naturally speaking software and may contain errors in syntax, spelling, or content which have not been identified prior to signing this note.

## 2023-09-11 DIAGNOSIS — H353221 Exudative age-related macular degeneration, left eye, with active choroidal neovascularization: Secondary | ICD-10-CM | POA: Diagnosis not present

## 2023-09-11 DIAGNOSIS — H353111 Nonexudative age-related macular degeneration, right eye, early dry stage: Secondary | ICD-10-CM | POA: Diagnosis not present

## 2023-09-11 DIAGNOSIS — H40013 Open angle with borderline findings, low risk, bilateral: Secondary | ICD-10-CM | POA: Diagnosis not present

## 2023-09-11 DIAGNOSIS — H35722 Serous detachment of retinal pigment epithelium, left eye: Secondary | ICD-10-CM | POA: Diagnosis not present

## 2023-09-12 ENCOUNTER — Other Ambulatory Visit: Payer: Self-pay

## 2023-09-12 ENCOUNTER — Encounter: Payer: Self-pay | Admitting: Internal Medicine

## 2023-09-12 ENCOUNTER — Ambulatory Visit (INDEPENDENT_AMBULATORY_CARE_PROVIDER_SITE_OTHER): Admitting: Internal Medicine

## 2023-09-12 VITALS — BP 120/78 | HR 69 | Temp 97.6°F | Ht 74.0 in | Wt 182.0 lb

## 2023-09-12 DIAGNOSIS — A310 Pulmonary mycobacterial infection: Secondary | ICD-10-CM

## 2023-09-12 DIAGNOSIS — J449 Chronic obstructive pulmonary disease, unspecified: Secondary | ICD-10-CM

## 2023-09-12 NOTE — Progress Notes (Signed)
 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/19/2022   S/P robot-assisted surgical procedure 11/13/2022   Right upper lobe pulmonary nodule 08/30/2022   Coin lesion 05/28/2022   GERD (gastroesophageal reflux disease) 03/15/2022   OSA (obstructive sleep apnea) 01/25/2022   Arthralgia 10/15/2021   Balance disorder 10/15/2021   Allergic rhinitis 10/12/2021   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   History of colonic polyps 10/12/2021   Nonspecific abnormal findings on radiological and examination of lung field 10/12/2021   Sensorineural hearing loss, 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   Unspecified protein-calorie malnutrition (HCC) 10/12/2021   Chest wall pain 10/12/2021   Status post eye surgery 08/10/2021   Choroidal neovascular membrane, 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   Groin pain, left 07/11/2021   Tremor 12/19/2020   Eustachian tube dysfunction, right 06/20/2020   Hemoptysis 04/08/2020   Depression 03/22/2020   Gait disorder 02/09/2020   Diplopia 10/21/2019   Hypotropia of right eye 08/25/2019   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   Vision changes 04/28/2019   Osteoarthritis of carpometacarpal (CMC) joint of thumb 03/11/2019   Coronary atherosclerosis 08/26/2018   Aortic atherosclerosis (HCC) 08/10/2018   Presbycusis of both ears 02/11/2018   Tinnitus 01/21/2018   Syncope 12/02/2017   Bradycardia    Preop exam for internal medicine 12/13/2015   Microhematuria 09/12/2015   Fatigue 08/01/2015   UTI (urinary tract infection) 08/01/2015   Libido, decreased 08/01/2015   TIA (transient ischemic attack) 12/03/2014   Vertigo 10/20/2014   Hearing loss d/t noise 10/20/2014   Elevated PSA 11/30/2013   Osteoarthritis of CMC joint of thumb  11/13/2013   Thumb pain 11/12/2013   Essential hypertension 10/17/2012   Elevated blood pressure 11/05/2011   Well adult exam 11/03/2010   ACTINIC KERATOSIS 05/05/2010   Pulmonary nodules 01/30/2010   Essential tremor 02/07/2009   Seasonal and perennial allergic rhinitis 07/13/2008   ABNORMAL GLUCOSE NEC 07/13/2008   SINUSITIS, ACUTE 02/03/2008   TOBACCO USE DISORDER/SMOKER-SMOKING CESSATION DISCUSSED 10/02/2007   COPD mixed type (HCC) 08/04/2007   ACQUIRED CYST OF KIDNEY 08/04/2007   Hypothyroidism 07/24/2007   RBBB 07/23/2007   RAYNAUD'S SYNDROME 07/23/2007   Osteoarthritis 07/23/2007   WHEEZING 07/23/2007   Abnormal chest CT 07/23/2007   Abnormality of lung on CXR 07/23/2007   Raynaud's phenomenon 07/23/2007      HPI: David Buck is a 83 y.o. male with hx raynauds' phenomenom, smoking hx, pulm nodules, copd, allergic rhinitis, osa on cpap, autoimmune thyroid disease with ocular myopathy, referred here by pulmonology for evaluation of ntm lung infection   I reviewed chart: Dr Roxy Cedar last office visit 08/22/23 BAL 03/2020 afb cx negative; cx with pseudomonas aeruginosa 08/2022 lung nodule biopsy positive for MAC. Stable symptoms and no plan for treatment of mac by pulm 07/2023 chest ct IMPRESSION: 1. 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 2. Increased tiny peripheral tree-in-bud nodules within the posterior and lateral right lung base. Findings are compatible with atypical infection such as MAI. 3. Stable tiny nodule within the periphery of the left apex measuring 3 mm. 4. Coronary artery calcifications. 5. Aortic Atherosclerosis (ICD10-I70.0) and Emphysema (ICD10-J43.9). Discussed the use of AI scribe software for clinical note transcription with the  patient, who gave verbal consent to proceed.   Per pulm's hx on 08/22/23 patient "sob began after thoracic  surgery and had persisted"  PFT: 06/2022 Fvc 3.9 (82%); fev1 2.29 (67%); fev1/fvc (59%)    Baseline activity: 3 flight of stairs He lives In retirement community and doesn't do much chores He does machine exercise 2 set of 12 reps of each resistance training Not very active per wife for lack of trying Pulse ox at home mid 90s; exercise high 90s without o2 supplement Likes to just watch tv/be on computer, and read -- fairly sedentary  Compared to before surgery he feels sob with long conversation  Really there is nothing to compare to.  No fever, chill, nightsweat, weight loss Appetite "too good" No cough  I also reviewed pathology 08/2022 -- no granuloma FINAL MICROSCOPIC DIAGNOSIS:  A. RIGHT LUNG, LOWER LOBE, WEDGE RESECTION: Fibroelastotic scar with focal acute and chronic inflammation (see comment) Emphysematous changes Negative for granulomas and carcinoma  B. LYMPH NODE, LEVEL 9, EXCISION: One benign lymph node, negative for tumor (0/1)  C. LYMPH NODE, LEVEL 7, EXCISION: One benign lymph node, negative for tumor (0/1)  D. LYMPH NODE, LEVEL 8, EXCISION: One benign lymph node, negative for tumor (0/1)  E. LYMPH NODE, LEVEL 7 #2, EXCISION: One benign lymph node, negative for tumor (0/1)  F. LYMPH NODE, LEVEL 11, EXCISION: One benign lymph node, negative for tumor (0/1)  G. LYMPH NODE, LEVEL 4, EXCISION: One benign lymph node, negative for tumor (0/1)  H. RIGHT LUNG, LOWER LOBE, NODULE, EXCISION: Granulomatous inflammation with associated organizing pneumonia (see comment) Acid-fast bacilli and fungal stains negative (AFB, GMS; adequate controls) Negative for carcinoma  I. 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   Hypertension    Hypothyroidism    Hx   LAFB (left anterior fascicular block) 2008   on EKG   Meniere's disease    OSA (obstructive sleep apnea) 01/25/2022   Osteoarthritis of right knee    Dr Eulah Pont   Pneumonia    2017, 2022   Posterior vitreous detachment 2011   R, body- Dr Allyne Gee   Prostatitis    Tinnitus     Social History   Tobacco Use   Smoking status: Former    Current packs/day: 0.00    Average packs/day: 1 pack/day for 55.6 years (55.6 ttl pk-yrs)    Types: Cigarettes    Start date: 19    Quit date: 01/17/2021    Years since quitting: 2.6   Smokeless tobacco: Never  Vaping Use   Vaping status: Never Used  Substance Use Topics   Alcohol use: Yes    Comment: rarely   Drug use: No    Family History  Problem Relation Age of Onset   CVA Mother    Cancer Mother        ovarian and bowel   Other Father        cerebral hemorrage   CVA Father        brain bleed   Hypertension Other     Allergies  Allergen  Reactions   Cefdinir Rash   Cephalosporins Rash    OBJECTIVE: Vitals:   09/12/23 1504  Weight: 182 lb (82.6 kg)  Height: 6\' 2"  (1.88 m)   Body mass index is 23.37 kg/m.   Physical Exam General/constitutional: no distress, pleasant HEENT: Normocephalic, PER, Conj Clear, EOMI, Oropharynx clear Neck supple CV: rrr no mrg Lungs: clear to auscultation, normal respiratory effort Abd: Soft, Nontender Ext: no edema Skin: No Rash Neuro: nonfocal MSK: no peripheral joint swelling/tenderness/warmth; back spines nontender    Lab: Lab Results  Component Value Date   WBC 6.8 04/30/2023   HGB 13.1 04/30/2023   HCT 40.6 04/30/2023   MCV 95.5 04/30/2023   PLT 295.0 04/30/2023   Last metabolic panel Lab Results  Component Value Date   GLUCOSE 96 06/03/2023   NA 138 06/03/2023   K 4.4 06/03/2023   CL 103 06/03/2023   CO2 28 06/03/2023    BUN 27 (H) 06/03/2023   CREATININE 1.23 06/03/2023   GFR 55.12 (L) 06/03/2023   CALCIUM 9.7 06/03/2023   PROT 7.1 06/03/2023   ALBUMIN 4.3 06/03/2023   BILITOT 0.7 06/03/2023   ALKPHOS 75 06/03/2023   AST 23 06/03/2023   ALT 23 06/03/2023   ANIONGAP 8 09/04/2022    Microbiology: 08/2022 biopsy cx Mac Amikacin Comment  Comment: (NOTE) 16.0 ug/mL Susceptible Breakpoints have been established for only some organism-drug combinations as indicated. This test was developed and its performance characteristics determined by [facility]. It has not been cleared or approved by the Food and Drug Administration.  Ciprofloxacin >8.0 ug/mL  Comment: (NOTE) Breakpoints have been established for only some organism-drug combinations as indicated. This test was developed and its performance characteristics determined by [facility]. It has not been cleared or approved by the Food and Drug Administration.  Clarithromycin 2.0 ug/mL Susceptible  Comment: (NOTE) Breakpoints have been established for only some organism-drug combinations as indicated. This test was developed and its performance characteristics determined by [facility]. It has not been cleared or approved by the Food and Drug Administration.  Clofazimine NOT PERFORMED  Comment: (NOTE) Test not performed Breakpoints have been established for only some organism-drug combinations as indicated. This test was developed and its performance characteristics determined by [facility]. It has not been cleared or approved by the Food and Drug Administration.  Doxycycline >8.0 ug/mL  Comment: (NOTE) Breakpoints have been established for only some organism-drug combinations as indicated. This test was developed and its performance characteristics determined by [facility]. It has not been cleared or approved by the Food and Drug Administration.  Linezolid 32.0 ug/mL Resistant  Comment: (NOTE) Breakpoints have been established for only some  organism-drug combinations as indicated. This test was developed and its performance characteristics determined by [facility]. It has not been cleared or approved by the Food and Drug Administration.  Minocycline >8.0 ug/mL  Comment: (NOTE) Breakpoints have been established for only some organism-drug combinations as indicated. This test was developed and its performance characteristics determined by [facility]. It has not been cleared or approved by the Food and Drug Administration.  Moxifloxacin 4.0 ug/mL Resistant  Comment: (NOTE) Breakpoints have been established for only some organism-drug combinations as indicated. This test was developed and its performance characteristics determined by [facility]. It has not been cleared or approved by the Food and Drug Administration.  Rifabutin 0.5 ug/mL  Comment: (NOTE) Breakpoints have been established for only some organism-drug combinations as indicated. This test was developed and its performance characteristics determined by [facility]. It has not been  cleared or approved by the Food and Drug Administration.  Rifampin >4.0 ug/mL  Comment: (NOTE) Breakpoints have been established for only some organism-drug combinations as indicated. This test was developed and its performance characteristics determined by [facility]. It has not been cleared or approved by the Food and Drug Administration.  Streptomycin >32.0 ug/mL  Comment: (NOTE) Breakpoints have been established for only some organism-drug combinations as indicated. This test was developed and its performance characteristics determined by [facility]. It has not been cleared or approved by the Food and Drug Administration.  Trimethoprim/Sulfa 2/38 ug/mL  Comment: (NOTE) Breakpoints have been established for only some organism-drug combinations as indicated.     Serology:  Imaging: Reviewed  07/2023 chest ct IMPRESSION: 1. Stable postsurgical changes from right upper and right lower  lobe wedge resections with pathology report compatible with mycobacterium avium complex. 2. Increased tiny peripheral tree-in-bud nodules within the posterior and lateral right lung base. Findings are compatible with atypical infection such as MAI. 3. Stable tiny nodule within the periphery of the left apex measuring 3 mm. 4. Coronary artery calcifications. 5. Aortic Atherosclerosis (ICD10-I70.0) and Emphysema (ICD10-J43.9). Discussed the use of AI scribe software for clinical note transcription with the patient, who gave verbal consent to proceed.  Assessment/plan: Problem List Items Addressed This Visit   None Visit Diagnoses       Chronic obstructive pulmonary disease, unspecified COPD type (HCC)    -  Primary     Nontuberculous mycobacterial disease of lung (HCC)             We discussed the required criteria (micro, clinical, imaging) requirement for dx of ntm lung Patient had one time 08/2022 lung biopsy for concern of cancer and that showed on culture only MAC. However the pathology didn't show any granuloma He has minimal change in ct tree in bud/perivascular nodule within the past year  He does have moderate copd physiology. Had quit smoking  He doesn't really have any objective comparision in terms of decline in functional status outside of subjective short of breath. But per his wife, she reports she hasn't seen anything in normal routine with her husband that he hasn't been able to do compared to last year before the surgery   I discussed also the toxicity of regimen of abx if we treat, the low success rate about 60%, and the high relapse rate. So at this time I am not anywhere near wanting to treat him   But to prepare in case we have to, I recommend: -repeat pft this year -monitor clinically for function sign of decline, b-sx, chronic pneumonia -repeat chest ct in 9-12 months -potentially repeat pft in a year if clinical decline -also within the next year I  would like to see 3 more sputums growing MAC   I will forward to dr Maple Hudson to help with pft and potentially get 3 sputum sets separate by 2-4 weeks apart (the sputum if patient can't get I wouldn't push for bronchoscopy given his rather benign or lack of symptoms), but at least get a pft within the next several months and repeat chest ct in around 9-12 months   F/u 9 months after pft and repeat chest ct   Follow-up: Return in about 9 months (around 06/13/2024).  Raymondo Band, MD [facility] 09/12/2023, 3:05 PM

## 2023-09-12 NOTE — Patient Instructions (Signed)
 At this time you might not even have MAC infection   We'll want repeat ct in 9 months, and if you can, get 3 more set of sputum 2 to 4 weeks apart to prove persistent of mac in the lung   Also please arrange with your lung doctor repeat pft this year and potentially again next year    Once we satisfy clinical decline (imaging, function) with persistent of bacteria in the lung, we'll revisit if you want to be on treatment   See me in 9 months

## 2023-09-23 DIAGNOSIS — H26492 Other secondary cataract, left eye: Secondary | ICD-10-CM | POA: Diagnosis not present

## 2023-09-27 DIAGNOSIS — H353221 Exudative age-related macular degeneration, left eye, with active choroidal neovascularization: Secondary | ICD-10-CM | POA: Diagnosis not present

## 2023-10-07 ENCOUNTER — Other Ambulatory Visit

## 2023-10-07 DIAGNOSIS — E034 Atrophy of thyroid (acquired): Secondary | ICD-10-CM

## 2023-10-07 DIAGNOSIS — E079 Disorder of thyroid, unspecified: Secondary | ICD-10-CM | POA: Diagnosis not present

## 2023-10-07 DIAGNOSIS — H0589 Other disorders of orbit: Secondary | ICD-10-CM

## 2023-10-07 DIAGNOSIS — H43392 Other vitreous opacities, left eye: Secondary | ICD-10-CM | POA: Diagnosis not present

## 2023-10-07 LAB — COMPREHENSIVE METABOLIC PANEL WITH GFR
ALT: 21 U/L (ref 0–53)
AST: 20 U/L (ref 0–37)
Albumin: 4.4 g/dL (ref 3.5–5.2)
Alkaline Phosphatase: 86 U/L (ref 39–117)
BUN: 24 mg/dL — ABNORMAL HIGH (ref 6–23)
CO2: 26 meq/L (ref 19–32)
Calcium: 9.6 mg/dL (ref 8.4–10.5)
Chloride: 104 meq/L (ref 96–112)
Creatinine, Ser: 1.17 mg/dL (ref 0.40–1.50)
GFR: 58.39 mL/min — ABNORMAL LOW (ref >60.00)
Glucose, Bld: 88 mg/dL (ref 70–99)
Potassium: 4.3 meq/L (ref 3.5–5.1)
Sodium: 139 meq/L (ref 135–145)
Total Bilirubin: 0.6 mg/dL (ref 0.2–1.2)
Total Protein: 7.1 g/dL (ref 6.0–8.3)

## 2023-10-07 LAB — TSH: TSH: 6.61 u[IU]/mL — ABNORMAL HIGH (ref 0.35–5.50)

## 2023-10-07 LAB — T3, FREE: T3, Free: 3.1 pg/mL (ref 2.3–4.2)

## 2023-10-07 LAB — T4, FREE: Free T4: 0.79 ng/dL (ref 0.60–1.60)

## 2023-10-14 ENCOUNTER — Ambulatory Visit (INDEPENDENT_AMBULATORY_CARE_PROVIDER_SITE_OTHER): Payer: Medicare Other | Admitting: Internal Medicine

## 2023-10-14 ENCOUNTER — Encounter: Payer: Self-pay | Admitting: Internal Medicine

## 2023-10-14 VITALS — BP 90/56 | HR 75 | Temp 97.8°F | Ht 74.0 in | Wt 183.8 lb

## 2023-10-14 DIAGNOSIS — I10 Essential (primary) hypertension: Secondary | ICD-10-CM | POA: Diagnosis not present

## 2023-10-14 DIAGNOSIS — E034 Atrophy of thyroid (acquired): Secondary | ICD-10-CM

## 2023-10-14 DIAGNOSIS — I2583 Coronary atherosclerosis due to lipid rich plaque: Secondary | ICD-10-CM

## 2023-10-14 DIAGNOSIS — E785 Hyperlipidemia, unspecified: Secondary | ICD-10-CM | POA: Diagnosis not present

## 2023-10-14 DIAGNOSIS — F341 Dysthymic disorder: Secondary | ICD-10-CM | POA: Diagnosis not present

## 2023-10-14 DIAGNOSIS — R55 Syncope and collapse: Secondary | ICD-10-CM | POA: Diagnosis not present

## 2023-10-14 DIAGNOSIS — R2689 Other abnormalities of gait and mobility: Secondary | ICD-10-CM | POA: Diagnosis not present

## 2023-10-14 NOTE — Assessment & Plan Note (Signed)
On lovastatin 

## 2023-10-14 NOTE — Assessment & Plan Note (Signed)
 Not better after recent strabismus surgery in 05/2023

## 2023-10-14 NOTE — Assessment & Plan Note (Signed)
 Not taking

## 2023-10-14 NOTE — Addendum Note (Signed)
 Addended by: Cornellius Kropp V on: 10/14/2023 03:46 PM   Modules accepted: Level of Service

## 2023-10-14 NOTE — Progress Notes (Signed)
 Subjective:  Patient ID: David Buck, male    DOB: Sep 07, 1940  Age: 83 y.o. MRN: [id number]  CC: Medical Management of Chronic Issues (3 Month Follow Up. TSH elevated, T4 and T3 normal during las set of labs. With this being the second time TSH was elevated wanted to make sure we were aware)   HPI David Buck presents for thyroid  issues, HTN, low BP today  Outpatient Medications Prior to Visit  Medication Sig Dispense Refill   ascorbic acid (VITAMIN C) 500 MG tablet Take 500 mg by mouth daily.     buPROPion  (WELLBUTRIN  XL) 150 MG 24 hr tablet TAKE 1 TABLET BY MOUTH DAILY. MUST SEE PROVIDER FOR FURTHER REFILLS 90 tablet 1   Cholecalciferol (VITAMIN D3) 50 MCG (2000 UT) TABS Take 2,000 Units by mouth every evening.     faricimab -svoa (VABYSMO ) 6 MG/0.05ML SOLN intravitreal injection 6 mg by Intravitreal route every 30 (thirty) days. Once every 4 weeks.     GLUCOSAMINE HCL PO Take 500 mg by mouth daily.     loratadine  (CLARITIN ) 10 MG tablet Take 1 tablet by mouth daily as needed for allergies.     LORazepam  (ATIVAN ) 0.5 MG tablet TAKE ONE TABLET BY MOUTH TWICE A DAY AS NEEDED FOR ANXIETY 60 tablet 3   Multiple Vitamin (MULTIVITAMIN WITH MINERALS) TABS tablet Take 1 tablet by mouth every evening.     olmesartan  (BENICAR ) 20 MG tablet TAKE 1 TABLET BY MOUTH DAILY 90 tablet 3   pantoprazole  (PROTONIX ) 40 MG tablet Take 1 tablet (40 mg total) by mouth daily. 90 tablet 1   Polyethyl Glycol-Propyl Glycol (SYSTANE ULTRA) 0.4-0.3 % SOLN Place 1 drop into both eyes every evening.     ZINC PICOLINATE PO Take 22 mg by mouth every evening.     rosuvastatin  (CRESTOR ) 10 MG tablet Take 1 tablet (10 mg total) by mouth daily. 90 tablet 3   tadalafil  (CIALIS ) 20 MG tablet Take 1 tablet (20 mg total) by mouth daily as needed for erectile dysfunction. (Patient not taking: Reported on 09/12/2023) 20 tablet 3   metroNIDAZOLE  (FLAGYL ) 500 MG tablet Take 1 tablet (500 mg total) by mouth 3 (three) times  daily. (Patient not taking: Reported on 08/22/2023) 30 tablet 0   ondansetron  (ZOFRAN ) 4 MG tablet Take 1 tablet (4 mg total) by mouth every 8 (eight) hours as needed for nausea or vomiting. (Patient not taking: Reported on 05/20/2023) 20 tablet 0   Probiotic Product (ALIGN) 4 MG CAPS Take 1 capsule (4 mg total) by mouth daily. (Patient not taking: Reported on 08/22/2023) 30 capsule 0   No facility-administered medications prior to visit.    ROS: Review of Systems  Constitutional:  Negative for appetite change, fatigue and unexpected weight change.  HENT:  Negative for congestion, nosebleeds, sneezing, sore throat and trouble swallowing.   Eyes:  Negative for itching and visual disturbance.  Respiratory:  Negative for cough.   Cardiovascular:  Negative for chest pain, palpitations and leg swelling.  Gastrointestinal:  Negative for abdominal distention, blood in stool, diarrhea and nausea.  Genitourinary:  Negative for frequency and hematuria.  Musculoskeletal:  Negative for back pain, gait problem, joint swelling and neck pain.  Skin:  Negative for rash.  Neurological:  Negative for dizziness, tremors, speech difficulty and weakness.  Psychiatric/Behavioral:  Negative for agitation, dysphoric mood, sleep disturbance and suicidal ideas. The patient is not nervous/anxious.     Objective:  BP (!) 90/56   Pulse 75  Temp 97.8 F (36.6 C)   Ht 6\' 2"  (1.88 m)   Wt 183 lb 12.8 oz (83.4 kg)   SpO2 95%   BMI 23.60 kg/m   BP Readings from Last 3 Encounters:  10/14/23 (!) 90/56  09/12/23 120/78  09/10/23 130/80    Wt Readings from Last 3 Encounters:  10/14/23 183 lb 12.8 oz (83.4 kg)  09/12/23 182 lb (82.6 kg)  09/10/23 184 lb (83.5 kg)    Physical Exam Constitutional:      General: He is not in acute distress.    Appearance: Normal appearance. He is well-developed.     Comments: NAD  Eyes:     Conjunctiva/sclera: Conjunctivae normal.     Pupils: Pupils are equal, round, and  reactive to light.  Neck:     Thyroid : No thyromegaly.     Vascular: No JVD.  Cardiovascular:     Rate and Rhythm: Normal rate and regular rhythm.     Heart sounds: Normal heart sounds. No murmur heard.    No friction rub. No gallop.  Pulmonary:     Effort: Pulmonary effort is normal. No respiratory distress.     Breath sounds: Normal breath sounds. No wheezing or rales.  Chest:     Chest wall: No tenderness.  Abdominal:     General: Bowel sounds are normal. There is no distension.     Palpations: Abdomen is soft. There is no mass.     Tenderness: There is no abdominal tenderness. There is no guarding or rebound.  Musculoskeletal:        General: No tenderness. Normal range of motion.     Cervical back: Normal range of motion.  Lymphadenopathy:     Cervical: No cervical adenopathy.  Skin:    General: Skin is warm and dry.     Findings: No rash.  Neurological:     Mental Status: He is alert and oriented to person, place, and time.     Cranial Nerves: No cranial nerve deficit.     Motor: No abnormal muscle tone.     Coordination: Coordination normal.     Gait: Gait normal.     Deep Tendon Reflexes: Reflexes are normal and symmetric.  Psychiatric:        Behavior: Behavior normal.        Thought Content: Thought content normal.        Judgment: Judgment normal.     Lab Results  Component Value Date   WBC 6.8 04/30/2023   HGB 13.1 04/30/2023   HCT 40.6 04/30/2023   PLT 295.0 04/30/2023   GLUCOSE 88 10/07/2023   CHOL 144 04/30/2023   TRIG 48.0 04/30/2023   HDL 63.10 04/30/2023   LDLCALC 71 04/30/2023   ALT 21 10/07/2023   AST 20 10/07/2023   NA 139 10/07/2023   K 4.3 10/07/2023   CL 104 10/07/2023   CREATININE 1.17 10/07/2023   BUN 24 (H) 10/07/2023   CO2 26 10/07/2023   TSH 6.61 (H) 10/07/2023   PSA 3.34 04/30/2023   INR 1.0 08/29/2022   HGBA1C 5.8 04/30/2023    CT Chest Wo Contrast Result Date: 08/22/2023 CLINICAL DATA:  History of biopsy-proven MAI  complex within the lungs. Status post wedge resections. Follow-up lung nodules. EXAM: CT CHEST WITHOUT CONTRAST TECHNIQUE: Multidetector CT imaging of the chest was performed following the standard protocol without IV contrast. RADIATION DOSE REDUCTION: This exam was performed according to the departmental dose-optimization program which includes automated exposure control, adjustment  of the mA and/or kV according to patient size and/or use of iterative reconstruction technique. COMPARISON:  01/30/2023 FINDINGS: Cardiovascular: Normal heart size. Aortic atherosclerosis and coronary artery calcifications. Mediastinum/Nodes: Thyroid  gland, trachea, and esophagus demonstrate no significant abnormality. No enlarged mediastinal or hilar lymph nodes. Lungs/Pleura: Biapical pleuroparenchymal scarring. Emphysema. Postoperative changes within the right upper and right lower lobes. Scattered areas of mucoid impaction. No pleural effusion or airspace consolidation. Increased tiny peripheral tree-in-bud nodules within the posterior and lateral right lung base, image 154/6. Stable tiny nodule within the periphery of the left apex measuring 3 mm, image 29/6. Upper Abdomen: No acute abnormality. Partially visualized, simple appearing cyst within the left kidney measures 6.9 cm, image 191/3. No specific follow-up imaging recommended. Musculoskeletal: No chest wall mass or suspicious bone lesions identified. IMPRESSION: 1. 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 2. Increased tiny peripheral tree-in-bud nodules within the posterior and lateral right lung base. Findings are compatible with atypical infection such as MAI. 3. Stable tiny nodule within the periphery of the left apex measuring 3 mm. 4. Coronary artery calcifications. 5. Aortic Atherosclerosis (ICD10-I70.0) and Emphysema (ICD10-J43.9). Electronically Signed   By:  Kimberley Penman M.D.   On: 08/22/2023 09:10    Assessment & Plan:   Problem List Items Addressed This Visit     Hypothyroidism - Primary   Doing well on Levothyroxine  Check TSH, FT4, FT3 Repeat CMET      Relevant Orders   Comprehensive metabolic panel with GFR   TSH   T4, free   T3, free   HTN (hypertension)   Low BP - hydrate better We can reduce Olmesartan  to 10 mg/d      Syncope   No relapse      Coronary atherosclerosis   On lovastatin       Relevant Orders   Lipid panel   Depression   Not taking      Balance disorder   Not better after recent strabismus surgery in 05/2023      Relevant Orders   Comprehensive metabolic panel with GFR   Other Visit Diagnoses       Dyslipidemia       Relevant Orders   Lipid panel         No orders of the defined types were placed in this encounter.     Follow-up: Return in about 3 months (around 01/13/2024) for a follow-up visit.  Anitra Barn, MD

## 2023-10-14 NOTE — Assessment & Plan Note (Signed)
 Doing well on Levothyroxine  Check TSH, FT4, FT3 Repeat CMET

## 2023-10-14 NOTE — Assessment & Plan Note (Addendum)
 Low BP - hydrate better We can reduce Olmesartan  to 10 mg/d

## 2023-10-14 NOTE — Assessment & Plan Note (Signed)
No relapse 

## 2023-10-28 DIAGNOSIS — H353221 Exudative age-related macular degeneration, left eye, with active choroidal neovascularization: Secondary | ICD-10-CM | POA: Diagnosis not present

## 2023-11-11 DIAGNOSIS — H26491 Other secondary cataract, right eye: Secondary | ICD-10-CM | POA: Diagnosis not present

## 2023-11-15 ENCOUNTER — Ambulatory Visit (INDEPENDENT_AMBULATORY_CARE_PROVIDER_SITE_OTHER): Admitting: Family

## 2023-11-15 ENCOUNTER — Ambulatory Visit: Payer: Self-pay

## 2023-11-15 ENCOUNTER — Encounter: Payer: Self-pay | Admitting: Family

## 2023-11-15 VITALS — BP 112/74 | HR 68 | Temp 97.9°F | Ht 74.0 in | Wt 185.4 lb

## 2023-11-15 DIAGNOSIS — R059 Cough, unspecified: Secondary | ICD-10-CM | POA: Diagnosis not present

## 2023-11-15 DIAGNOSIS — J069 Acute upper respiratory infection, unspecified: Secondary | ICD-10-CM | POA: Diagnosis not present

## 2023-11-15 MED ORDER — METHYLPREDNISOLONE 4 MG PO TBPK: ORAL_TABLET | ORAL | 0 refills | Status: DC

## 2023-11-15 NOTE — Progress Notes (Unsigned)
 Acute Office Visit  Subjective:     Patient ID: David Buck, male    DOB: 15-Sep-1940, 83 y.o.   MRN: [id number]  Chief Complaint  Patient presents with  . Cough    Productive cough, brown sputum (notes this comes and goes), hoarseness 3-4 weeks. Also notes some shortness of breath (patient does have COPD and believes this to be the cause) and tightness in the chest. Has some rattling in the chest. Does have issues with seasonal allergies. Not currently taking anything    HPI Patient is in today with c/o cough with yellow sputum production, hoarseness, chest tightness x 3-4 weeks. Denies any fever and overall does not feel bad. Admits to PND. Not taking any medication. Had a CT of the chest done in Feb that was stable. Is due for a follow-up CXR with pulmonology (future order placed) soon.   Review of Systems  Constitutional:  Negative for chills, fever and weight loss.  HENT:  Negative for ear pain, sinus pain and sore throat.        Post nasal drip  Respiratory:  Positive for cough and sputum production. Negative for hemoptysis and wheezing.   Musculoskeletal: Negative.   Neurological: Negative.   Psychiatric/Behavioral: Negative.    All other systems reviewed and are negative. Past Medical History:  Diagnosis Date  . Allergy    rhinitis  . Anxiety   . COPD (chronic obstructive pulmonary disease) (HCC)   . Essential tremor    Left Arm  . Exertional dyspnea 05/20/2023  . Hepatitis   . 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 Hypertension   . Hypothyroidism    Hx  . 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 Meniere's disease   . OSA (obstructive sleep apnea) 01/25/2022  . Osteoarthritis of right knee    Dr Abigail Abler  . Pneumonia    2017, 2022  . Posterior vitreous detachment 2011   R, body- Dr Elnita Hai  . Prostatitis   . Tinnitus     Social History   Socioeconomic History  . Marital status: Married    Spouse name: Not on file  . Number of  children: Not on file  . Years of education: Not on file  . Highest education level: Bachelor's degree (e.g., BA, AB, BS)  Occupational History  . Not on file  Tobacco Use  . Smoking status: Former    Current packs/day: 0.00    Average packs/day: 1 pack/day for 55.6 years (55.6 ttl pk-yrs)    Types: Cigarettes    Start date: 21    Quit date: 01/17/2021    Years since quitting: 2.8  . Smokeless tobacco: Never  Vaping Use  . Vaping status: Never Used  Substance and Sexual Activity  . Alcohol use: Yes    Comment: rarely  . Drug use: No  . Sexual activity: Yes  Other Topics Concern  . Not on file  Social History Narrative  . Not on file   Social Drivers of Health   Financial Resource Strain: Low Risk  (04/15/2023)   Overall Financial Resource Strain (CARDIA)   . Difficulty of Paying Living Expenses: Not hard at all  Food Insecurity: No Food Insecurity (07/09/2023)   Hunger Vital Sign   . Worried About Programme researcher, broadcasting/film/video in the Last Year: Never true   . Ran Out of Food in the Last Year: Never true  Transportation Needs: No Transportation Needs (07/09/2023)  PRAPARE - Transportation   . Lack of Transportation (Medical): No   . Lack of Transportation (Non-Medical): No  Physical Activity: Insufficiently Active (07/09/2023)   Exercise Vital Sign   . Days of Exercise per Week: 2 days   . Minutes of Exercise per Session: 50 min  Stress: No Stress Concern Present (07/09/2023)   [employer] of Occupational Health - Occupational Stress Questionnaire   . Feeling of Stress : Only a little  Social Connections: Moderately Isolated (07/09/2023)   Social Connection and Isolation Panel [NHANES]   . Frequency of Communication with Friends and Family: More than three times a week   . Frequency of Social Gatherings with Friends and Family: Once a week   . Attends Religious Services: Never   . Active Member of Clubs or Organizations: No   . Attends Banker Meetings: Not on  file   . Marital Status: Married  Catering manager Violence: Not At Risk (06/28/2022)   Humiliation, Afraid, Rape, and Kick questionnaire   . Fear of Current or Ex-Partner: No   . Emotionally Abused: No   . Physically Abused: No   . Sexually Abused: No    Past Surgical History:  Procedure Laterality Date  . bilateral cataract surgery     . BRONCHOSCOPY  04/14/2008   benign  . COLONOSCOPY    . INGUINAL HERNIA REPAIR  06/26/2003   Dr Gaylyn Keas- bilateral   . INTERCOSTAL NERVE BLOCK  08/30/2022   Procedure: INTERCOSTAL NERVE BLOCK;  Surgeon: Zelphia Higashi, MD;  Location: [facility] OR;  Service: Thoracic;;  . LYMPH NODE DISSECTION  08/30/2022   Procedure: LYMPH NODE DISSECTION;  Surgeon: Zelphia Higashi, MD;  Location: [facility] OR;  Service: Thoracic;;  . right knee menisc tear repair    . STRABISMUS SURGERY Right 04/20/2021   Procedure: STRABISMUS REPAIR RIGHT EYE;  Surgeon: Ozella Blush, MD;  Location: [facility];  Service: Ophthalmology;  Laterality: Right;  . STRABISMUS SURGERY  2023   Done at [facility]  . 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   Procedure: RIGHT TOTAL KNEE ARTHROPLASTY;  Surgeon: Liliane Rei, MD;  Location: [facility] ORS;  Service: Orthopedics;  Laterality: Right;  Aaron Aas VIDEO BRONCHOSCOPY N/A 04/14/2020   Procedure: VIDEO BRONCHOSCOPY WITHOUT FLUORO;  Surgeon: Quillian Brunt, MD;  Location: [facility];  Service: Cardiopulmonary;  Laterality: N/A;    Family History  Problem Relation Age of Onset  . CVA Mother   . Cancer Mother        ovarian and bowel  . Other Father        cerebral hemorrage  . CVA Father        brain bleed  . Hypertension Other     Allergies  Allergen Reactions  . Cefdinir Rash  . Cephalosporins Rash    Current Outpatient Medications on File Prior to Visit  Medication Sig Dispense Refill  . ascorbic acid (VITAMIN C) 500 MG tablet Take 500 mg by mouth daily.    . buPROPion  (WELLBUTRIN  XL) 150 MG 24 hr tablet TAKE 1  TABLET BY MOUTH DAILY. MUST SEE PROVIDER FOR FURTHER REFILLS 90 tablet 1  . Cholecalciferol (VITAMIN D3) 50 MCG (2000 UT) TABS Take 2,000 Units by mouth every evening.    . faricimab -svoa (VABYSMO ) 6 MG/0.05ML SOLN intravitreal injection 6 mg by Intravitreal route every 30 (thirty) days. Once every 4 weeks.    Aaron Aas GLUCOSAMINE HCL PO Take 500 mg by mouth daily.    . LORazepam  (ATIVAN )  0.5 MG tablet TAKE ONE TABLET BY MOUTH TWICE A DAY AS NEEDED FOR ANXIETY 60 tablet 3  . Multiple Vitamin (MULTIVITAMIN WITH MINERALS) TABS tablet Take 1 tablet by mouth every evening.    . olmesartan  (BENICAR ) 20 MG tablet TAKE 1 TABLET BY MOUTH DAILY 90 tablet 3  . pantoprazole  (PROTONIX ) 40 MG tablet Take 1 tablet (40 mg total) by mouth daily. 90 tablet 1  . Polyethyl Glycol-Propyl Glycol (SYSTANE ULTRA) 0.4-0.3 % SOLN Place 1 drop into both eyes every evening.    Aaron Aas ZINC PICOLINATE PO Take 22 mg by mouth every evening.    . loratadine  (CLARITIN ) 10 MG tablet Take 1 tablet by mouth daily as needed for allergies. (Patient not taking: Reported on 11/15/2023)    . rosuvastatin  (CRESTOR ) 10 MG tablet Take 1 tablet (10 mg total) by mouth daily. 90 tablet 3  . tadalafil  (CIALIS ) 20 MG tablet Take 1 tablet (20 mg total) by mouth daily as needed for erectile dysfunction. (Patient not taking: Reported on 09/12/2023) 20 tablet 3   No current facility-administered medications on file prior to visit.    BP 112/74   Pulse 68   Temp 97.9 F (36.6 C)   Ht 6\' 2"  (1.88 m)   Wt 185 lb 6.4 oz (84.1 kg)   SpO2 94%   BMI 23.80 kg/m chart      Objective:    BP 112/74   Pulse 68   Temp 97.9 F (36.6 C)   Ht 6\' 2"  (1.88 m)   Wt 185 lb 6.4 oz (84.1 kg)   SpO2 94%   BMI 23.80 kg/m    Physical Exam Vitals and nursing note reviewed.  Constitutional:      Appearance: Normal appearance. He is normal weight.  HENT:     Right Ear: Tympanic membrane, ear canal and external ear normal.     Left Ear: Tympanic membrane, ear  canal and external ear normal.     Nose: Nose normal. No congestion.     Mouth/Throat:     Mouth: Mucous membranes are moist.     Pharynx: Oropharynx is clear.  Cardiovascular:     Rate and Rhythm: Normal rate and regular rhythm.     Pulses: Normal pulses.     Heart sounds: Normal heart sounds.  Pulmonary:     Effort: Pulmonary effort is normal.     Breath sounds: Normal breath sounds.  Musculoskeletal:        General: Normal range of motion.  Skin:    General: Skin is warm and dry.  Neurological:     General: No focal deficit present.     Mental Status: He is alert and oriented to person, place, and time.  Psychiatric:        Mood and Affect: Mood normal.        Behavior: Behavior normal.        Thought Content: Thought content normal.   No results found for any visits on 11/15/23.      Assessment & Plan:   Problem List Items Addressed This Visit   None Visit Diagnoses       Upper respiratory tract infection, unspecified type    -  Primary     Cough, unspecified type           Meds ordered this encounter  Medications  . methylPREDNISolone  (MEDROL  DOSEPAK) 4 MG TBPK tablet    Sig: As directed    Dispense:  21 tablet  Refill:  0   Call the office if symptoms worsen or persist. Recheck as scheduled and sooner as needed.  No follow-ups on file.  Kimmora Risenhoover B Shanay Woolman, FNP

## 2023-11-15 NOTE — Telephone Encounter (Signed)
 Chief Complaint: Productive cough Symptoms: brown sputum, SOB Frequency: x 3-4 weels Pertinent Negatives: Patient denies fever Disposition: [] ED /[] Urgent Care (no appt availability in office) / [x] Appointment(In office/virtual)/ []  [facility]/ [] Home Care/ [] Refused Recommended Disposition /[] [facility]/ []  Follow-up with PCP Additional Notes: Pt notes productive cough present x 3-4 weeks, producing brown sputum, denies hemoptysis, fever. No OV available with pulm, OV scheduled with PCP. This RN educated pt on home care, new-worsening symptoms, when to call back/seek emergent care. Pt verbalized understanding and agrees to plan.    Copied from CRM [id number]. Topic: Clinical - Red Word Triage >> Nov 15, 2023 11:19 AM Margarette Shawl wrote: Red Word that prompted transfer to Nurse Triage:   Symptoms for 3 to 4 weeks  Cough, brownish phlegm present Harshness after few minutes of talking   Shortness of breath for over year No fever, night sweats Reason for Disposition  Coughing up rusty-colored (reddish-brown) sputum  Answer Assessment - Initial Assessment Questions 1. ONSET: "When did the cough begin?"      X 4 weeks 3. SPUTUM: "Describe the color of your sputum" (none, dry cough; clear, white, yellow, green)     Brown 4. HEMOPTYSIS: "Are you coughing up any blood?" If so ask: "How much?" (flecks, streaks, tablespoons, etc.)     None 5. DIFFICULTY BREATHING: "Are you having difficulty breathing?" If Yes, ask: "How bad is it?" (e.g., mild, moderate, severe)    - MILD: No SOB at rest, mild SOB with walking, speaks normally in sentences, can lie down, no retractions, pulse < 100.    - MODERATE: SOB at rest, SOB with minimal exertion and prefers to sit, cannot lie down flat, speaks in phrases, mild retractions, audible wheezing, pulse 100-120.    - SEVERE: Very SOB at rest, speaks in single words, struggling to breathe, sitting hunched forward, retractions, pulse > 120       "All the time since surgery March 2024" 6. FEVER: "Do you have a fever?" If Yes, ask: "What is your temperature, how was it measured, and when did it start?"     None  10. OTHER SYMPTOMS: "Do you have any other symptoms?" (e.g., runny nose, wheezing, chest pain)       SOB  Protocols used: Cough - Acute Productive-A-AH

## 2023-11-21 ENCOUNTER — Other Ambulatory Visit: Payer: Self-pay | Admitting: Internal Medicine

## 2023-11-25 DIAGNOSIS — H35373 Puckering of macula, bilateral: Secondary | ICD-10-CM | POA: Diagnosis not present

## 2023-11-25 DIAGNOSIS — H43813 Vitreous degeneration, bilateral: Secondary | ICD-10-CM | POA: Diagnosis not present

## 2023-11-25 DIAGNOSIS — H4421 Degenerative myopia, right eye: Secondary | ICD-10-CM | POA: Diagnosis not present

## 2023-11-25 DIAGNOSIS — Z961 Presence of intraocular lens: Secondary | ICD-10-CM | POA: Diagnosis not present

## 2023-11-25 DIAGNOSIS — H353221 Exudative age-related macular degeneration, left eye, with active choroidal neovascularization: Secondary | ICD-10-CM | POA: Diagnosis not present

## 2023-11-26 DIAGNOSIS — E785 Hyperlipidemia, unspecified: Secondary | ICD-10-CM | POA: Diagnosis not present

## 2023-11-26 DIAGNOSIS — I10 Essential (primary) hypertension: Secondary | ICD-10-CM | POA: Diagnosis not present

## 2023-11-26 DIAGNOSIS — Z5181 Encounter for therapeutic drug level monitoring: Secondary | ICD-10-CM | POA: Diagnosis not present

## 2023-11-27 LAB — LIPID PANEL
Chol/HDL Ratio: 1.9 ratio (ref 0.0–5.0)
Cholesterol, Total: 118 mg/dL (ref 100–199)
HDL: 63 mg/dL (ref >39)
LDL Chol Calc (NIH): 44 mg/dL (ref 0–99)
Triglycerides: 46 mg/dL (ref 0–149)
VLDL Cholesterol Cal: 11 mg/dL (ref 5–40)

## 2023-11-27 LAB — COMPREHENSIVE METABOLIC PANEL WITH GFR
ALT: 20 IU/L (ref 0–44)
AST: 16 IU/L (ref 0–40)
Albumin: 4.1 g/dL (ref 3.7–4.7)
Alkaline Phosphatase: 96 IU/L (ref 44–121)
BUN/Creatinine Ratio: 18 (ref 10–24)
BUN: 20 mg/dL (ref 8–27)
Bilirubin Total: 0.5 mg/dL (ref 0.0–1.2)
CO2: 23 mmol/L (ref 20–29)
Calcium: 9.4 mg/dL (ref 8.6–10.2)
Chloride: 104 mmol/L (ref 96–106)
Creatinine, Ser: 1.09 mg/dL (ref 0.76–1.27)
Globulin, Total: 2.5 g/dL (ref 1.5–4.5)
Glucose: 90 mg/dL (ref 70–99)
Potassium: 4.7 mmol/L (ref 3.5–5.2)
Sodium: 140 mmol/L (ref 134–144)
Total Protein: 6.6 g/dL (ref 6.0–8.5)
eGFR: 68 mL/min/{1.73_m2} (ref >59)

## 2023-11-28 ENCOUNTER — Ambulatory Visit (HOSPITAL_BASED_OUTPATIENT_CLINIC_OR_DEPARTMENT_OTHER): Payer: Self-pay | Admitting: Family

## 2023-12-03 DIAGNOSIS — L718 Other rosacea: Secondary | ICD-10-CM | POA: Diagnosis not present

## 2023-12-03 DIAGNOSIS — L738 Other specified follicular disorders: Secondary | ICD-10-CM | POA: Diagnosis not present

## 2023-12-03 DIAGNOSIS — L089 Local infection of the skin and subcutaneous tissue, unspecified: Secondary | ICD-10-CM | POA: Diagnosis not present

## 2023-12-11 ENCOUNTER — Ambulatory Visit

## 2023-12-11 VITALS — Ht 74.0 in | Wt 185.0 lb

## 2023-12-11 DIAGNOSIS — Z0000 Encounter for general adult medical examination without abnormal findings: Secondary | ICD-10-CM

## 2023-12-11 NOTE — Patient Instructions (Signed)
 [name] , Thank you for taking time out of your busy schedule to complete your Annual Wellness Visit with me. I enjoyed our conversation and look forward to speaking with you again next year. I, as well as your care team,  appreciate your ongoing commitment to your health goals. Please review the following plan we discussed and let me know if I can assist you in the future. Your Game plan/ To Do List    Follow up Visits: Next Medicare AWV with our clinical staff: 12/11/2024.   Have you seen your provider in the last 6 months (3 months if uncontrolled diabetes)? Yes Next Office Visit with your provider: 01/13/2024.  Clinician Recommendations:  Aim for 30 minutes of exercise or brisk walking, 6-8 glasses of water, and 5 servings of fruits and vegetables each day. You are due for a Shingles vaccine and can get those done at your local pharmacy.        This is a list of the screening recommended for you and due dates:  Health Maintenance  Topic Date Due   Zoster (Shingles) Vaccine (1 of 2) Never done   COVID-19 Vaccine (6 - 2024-25 season) 02/24/2023   Medicare Annual Wellness Visit  06/29/2023   Flu Shot  01/24/2024   DTaP/Tdap/Td vaccine (4 - Td or Tdap) 12/03/2027   Pneumococcal Vaccine for age over 65  Completed   HPV Vaccine  Aged Out   Meningitis B Vaccine  Aged Out   Screening for Lung Cancer  Discontinued   Hepatitis C Screening  Discontinued    Advanced directives: (Copy Requested) Please bring a copy of your health care power of attorney and living will to the office to be added to your chart at your convenience. You can mail to [facility] [address] [place], [place] [address] or email to [email or web address] Advance Care Planning is important because it:  [x]  Makes sure you receive the medical care that is consistent with your values, goals, and preferences  [x]  It provides guidance to your family and loved ones and reduces their decisional burden  about whether or not they are making the right decisions based on your wishes.  Follow the link provided in your after visit summary or read over the paperwork we have mailed to you to help you started getting your Advance Directives in place. If you need assistance in completing these, please reach out to us  so that we can help you!  See attachments for Preventive Care and Fall Prevention Tips.

## 2023-12-11 NOTE — Progress Notes (Cosign Needed)
 Subjective:   David Buck is a 83 y.o. who presents for a Medicare Wellness preventive visit.  As a reminder, Annual Wellness Visits don't include a physical exam, and some assessments may be limited, especially if this visit is performed virtually. We may recommend an in-person follow-up visit with your provider if needed.  Visit Complete: Virtual I connected with  David Buck on 12/11/23 by a audio enabled telemedicine application and verified that I am speaking with the correct person using two identifiers.  Patient Location: Home  Provider Location: Home Office  I discussed the limitations of evaluation and management by telemedicine. The patient expressed understanding and agreed to proceed.  Vital Signs: Because this visit was a virtual/telehealth visit, some criteria may be missing or patient reported. Any vitals not documented were not able to be obtained and vitals that have been documented are patient reported.  VideoDeclined- This patient declined Librarian, academic. Therefore the visit was completed with audio only.  Persons Participating in Visit: Patient.  AWV Questionnaire: Yes: Patient Medicare AWV questionnaire was completed by the patient on 12/10/2023; I have confirmed that all information answered by patient is correct and no changes since this date.  Cardiac Risk Factors include: advanced age (>56men, >29 women);male gender;hypertension;Other (see comment), Risk factor comments: TIA, COPD, OSA,     Objective:    Today's Vitals   12/11/23 1311  Weight: 185 lb (83.9 kg)  Height: 6' 2 (1.88 m)   Body mass index is 23.75 kg/m.     12/11/2023    1:18 PM 08/29/2022    1:38 PM 06/28/2022    1:08 PM 06/27/2021    3:04 PM 04/20/2021    8:00 AM 04/12/2021    2:31 PM 05/16/2020   11:46 AM  Advanced Directives  Does Patient Have a Medical Advance Directive? Yes Yes Yes Yes Yes Yes Yes  Type of Engineer, mining of [place];Living will Healthcare Power of [place];Living will Healthcare Power of [place];Living will Living will;Healthcare Power of [employer] Power of [place];Living will  Living will;Healthcare Power of Attorney  Does patient want to make changes to medical advance directive?    No - Patient declined No - Patient declined  No - Patient declined  Copy of Healthcare Power of Attorney in Chart? No - copy requested No - copy requested No - copy requested No - copy requested No - copy requested No - copy requested No - copy requested  Would patient like information on creating a medical advance directive?     No - Patient declined No - Patient declined     Current Medications (verified) Outpatient Encounter Medications as of 12/11/2023  Medication Sig   ascorbic acid (VITAMIN C) 500 MG tablet Take 500 mg by mouth daily.   buPROPion  (WELLBUTRIN  XL) 150 MG 24 hr tablet TAKE 1 TABLET BY MOUTH DAILY. MUST HAVE APPOINTMENT FOR FURTHER REFILLS   Cholecalciferol (VITAMIN D3) 50 MCG (2000 UT) TABS Take 2,000 Units by mouth every evening.   doxycycline  (VIBRAMYCIN ) 100 MG capsule Take 100 mg by mouth daily.   faricimab -svoa (VABYSMO ) 6 MG/0.05ML SOLN intravitreal injection 6 mg by Intravitreal route every 30 (thirty) days. Once every 4 weeks.   loratadine  (CLARITIN ) 10 MG tablet Take 1 tablet by mouth daily as needed for allergies.   LORazepam  (ATIVAN ) 0.5 MG tablet TAKE ONE TABLET BY MOUTH TWICE A DAY AS NEEDED FOR ANXIETY   Multiple Vitamin (MULTIVITAMIN WITH MINERALS) TABS tablet Take  1 tablet by mouth every evening.   olmesartan  (BENICAR ) 20 MG tablet TAKE 1 TABLET BY MOUTH DAILY   pantoprazole  (PROTONIX ) 40 MG tablet Take 1 tablet (40 mg total) by mouth daily.   Polyethyl Glycol-Propyl Glycol (SYSTANE ULTRA) 0.4-0.3 % SOLN Place 1 drop into both eyes every evening.   prednisoLONE acetate (PRED FORTE) 1 % ophthalmic suspension Place 1 drop into both eyes.   rosuvastatin  (CRESTOR ) 10  MG tablet Take 1 tablet (10 mg total) by mouth daily.   ZINC PICOLINATE PO Take 22 mg by mouth every evening.   GLUCOSAMINE HCL PO Take 500 mg by mouth daily.   methylPREDNISolone  (MEDROL  DOSEPAK) 4 MG TBPK tablet As directed   tadalafil  (CIALIS ) 20 MG tablet Take 1 tablet (20 mg total) by mouth daily as needed for erectile dysfunction. (Patient not taking: Reported on 12/11/2023)   No facility-administered encounter medications on file as of 12/11/2023.    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   Meniere's disease    OSA (obstructive sleep apnea) 01/25/2022   Osteoarthritis of right knee    Dr Beverley   Pneumonia    2017, 2022   Posterior vitreous detachment 2011   R, body- Dr Jarold   Prostatitis    Tinnitus    Past Surgical History:  Procedure Laterality Date   bilateral cataract surgery      BRONCHOSCOPY  04/14/2008   benign   COLONOSCOPY     INGUINAL HERNIA REPAIR  06/26/2003   Dr Gladis- bilateral    INTERCOSTAL NERVE BLOCK  08/30/2022   Procedure: INTERCOSTAL NERVE BLOCK;  Surgeon: Kerrin Elspeth BROCKS, MD;  Location: [facility] OR;  Service: Thoracic;;   LYMPH NODE DISSECTION  08/30/2022   Procedure: LYMPH NODE DISSECTION;  Surgeon: Kerrin Elspeth BROCKS, MD;  Location: [facility] OR;  Service: Thoracic;;   right knee menisc tear repair     STRABISMUS SURGERY Right 04/20/2021   Procedure: STRABISMUS REPAIR RIGHT EYE;  Surgeon: Tobie Factor, MD;  Location: [facility];  Service: Ophthalmology;  Laterality: Right;   STRABISMUS SURGERY  2023   Done at [facility]   STRABISMUS SURGERY  04/2023   TOTAL  KNEE ARTHROPLASTY Right 01/16/2016   Procedure: RIGHT TOTAL KNEE ARTHROPLASTY;  Surgeon: Dempsey Moan, MD;  Location: [facility] ORS;  Service: Orthopedics;  Laterality: Right;   VIDEO BRONCHOSCOPY N/A 04/14/2020   Procedure: VIDEO BRONCHOSCOPY WITHOUT FLUORO;  Surgeon: Kassie Acquanetta Bradley, MD;  Location: [facility];  Service: Cardiopulmonary;  Laterality: N/A;   Family History  Problem Relation Age of Onset   CVA Mother    Cancer Mother        ovarian and bowel   Other Father        cerebral hemorrage   CVA Father        brain bleed   Hypertension Other    Social History   Socioeconomic History   Marital status: Married    Spouse name: Mary   Number of children: Not on file   Years of education:  Not on file   Highest education level: Bachelor's degree (e.g., BA, AB, BS)  Occupational History   Occupation: RETIRED  Tobacco Use   Smoking status: Former    Current packs/day: 0.00    Average packs/day: 1 pack/day for 55.6 years (55.6 ttl pk-yrs)    Types: Cigarettes    Start date: 28    Quit date: 01/17/2021    Years since quitting: 2.8   Smokeless tobacco: Never  Vaping Use   Vaping status: Never Used  Substance and Sexual Activity   Alcohol use: Yes    Comment: rarely   Drug use: No   Sexual activity: Yes  Other Topics Concern   Not on file  Social History Narrative   Lives with wife and 1 dog/2025   Social Drivers of Health   Financial Resource Strain: Low Risk  (12/10/2023)   Overall Financial Resource Strain (CARDIA)    Difficulty of Paying Living Expenses: Not hard at all  Food Insecurity: No Food Insecurity (12/10/2023)   Hunger Vital Sign    Worried About Running Out of Food in the Last Year: Never true    Ran Out of Food in the Last Year: Never true  Transportation Needs: No Transportation Needs (12/10/2023)   PRAPARE - Administrator, Civil Service (Medical): No    Lack of Transportation (Non-Medical): No  Physical Activity: Inactive (12/10/2023)    Exercise Vital Sign    Days of Exercise per Week: 0 days    Minutes of Exercise per Session: Not on file  Stress: No Stress Concern Present (12/10/2023)   [employer] of Occupational Health - Occupational Stress Questionnaire    Feeling of Stress: Not at all  Social Connections: Socially Isolated (12/10/2023)   Social Connection and Isolation Panel    Frequency of Communication with Friends and Family: Once a week    Frequency of Social Gatherings with Friends and Family: Once a week    Attends Religious Services: Never    Database administrator or Organizations: No    Attends Engineer, structural: Not on file    Marital Status: Married    Tobacco Counseling Counseling given: Not Answered    Clinical Intake:  Pre-visit preparation completed: Yes  Pain : No/denies pain     BMI - recorded: 23.75 Nutritional Status: BMI of 19-24  Normal Nutritional Risks: None  Lab Results  Component Value Date   HGBA1C 5.8 04/30/2023   HGBA1C 6.0 05/22/2022   HGBA1C 5.8 10/26/2010     How often do you need to have someone help you when you read instructions, pamphlets, or other written materials from your doctor or pharmacy?: 1 - Never         Activities of Daily Living     12/10/2023    5:44 PM  In your present state of health, do you have any difficulty performing the following activities:  Hearing? 0  Vision? 0  Difficulty concentrating or making decisions? 0  Walking or climbing stairs? 0  Dressing or bathing? 0  Doing errands, shopping? 0  Preparing Food and eating ? N  Using the Toilet? N  In the past six months, have you accidently leaked urine? N  Do you have problems with loss of bowel control? N  Managing your Medications? N  Managing your Finances? N  Housekeeping or managing your Housekeeping? N    Patient Care Team: [name], Karlynn GAILS, MD as PCP - General Neysa Reggy BIRCH, MD (Pulmonary  Disease) Avram Lupita BRAVO, MD  (Gastroenterology) Arvell Evalene SAUNDERS, DO as Consulting Physician (Sports Medicine) Matilda Senior, MD as Consulting Physician (Urology) Melodi Lerner, MD as Consulting Physician (Orthopedic Surgery) Gladis Evalene, MD as Referring Physician (Ophthalmology) Isidor Somerset, MD as Referring Physician (Cardiology) Claudene Elsie JUDITHANN Mickey., MD (Endocrinology) Melba Geofm Helling, MD as Referring Physician (Ophthalmology) Cleatus Collar, MD as Consulting Physician (Ophthalmology) Kerrin Elspeth BROCKS, MD as Consulting Physician (Cardiothoracic Surgery)  I have updated your Care Teams any recent Medical Services you may have received from other providers in the past year.     Assessment:   This is a routine wellness examination for Apolinar.  Hearing/Vision screen Hearing Screening - Comments:: Has hearing aides but does not wear them-per pt Vision Screening - Comments:: Denies vision issues./ [place]/Dr. Josh    Goals Addressed             This Visit's Progress    To maintain my current health status by being independent, keeping up with all my appointments and staying focused.   On track      Depression Screen     12/11/2023    1:19 PM 04/19/2023    3:14 PM 12/12/2022    3:47 PM 06/28/2022    1:11 PM 05/28/2022    4:14 PM 01/29/2022    3:49 PM 10/12/2021    3:03 PM  PHQ 2/9 Scores  PHQ - 2 Score 0 0 0 0 0 1 0  PHQ- 9 Score 0     2 0    Fall Risk     12/10/2023    5:44 PM 09/12/2023    3:10 PM 08/22/2023    3:27 PM 04/19/2023    3:14 PM 12/12/2022    3:46 PM  Fall Risk   Falls in the past year? 0 0 0 0 0  Number falls in past yr: 0  0 0 0  Injury with Fall? 0  0 0 0  Risk for fall due to :  No Fall Risks No Fall Risks No Fall Risks No Fall Risks  Follow up  Falls evaluation completed Falls evaluation completed Falls evaluation completed Falls evaluation completed    MEDICARE RISK AT HOME:  Medicare Risk at Home Any stairs in or around the home?: (Patient-Rptd)  No Home free of loose throw rugs in walkways, pet beds, electrical cords, etc?: (Patient-Rptd) No Adequate lighting in your home to reduce risk of falls?: (Patient-Rptd) Yes Life alert?: (Patient-Rptd) No Use of a cane, walker or w/c?: (Patient-Rptd) No Grab bars in the bathroom?: (Patient-Rptd) Yes Elevated toilet seat or a handicapped toilet?: (Patient-Rptd) No  TIMED UP AND GO:  Was the test performed?  No  Cognitive Function: Declined/Normal: No cognitive concerns noted by patient or family. Patient alert, oriented, able to answer questions appropriately and recall recent events. No signs of memory loss or confusion.        06/28/2022    1:11 PM  6CIT Screen  What Year? 0 points  What month? 0 points  What time? 0 points  Count back from 20 0 points  Months in reverse 0 points  Repeat phrase 0 points  Total Score 0 points    Immunizations Immunization History  Administered Date(s) Administered   BCG 04/29/2018   Fluad Quad(high Dose 65+) 04/10/2019, 04/05/2020   Fluad Trivalent(High Dose 65+) 04/19/2023   H1N1 04/25/2008   Influenza Split 05/07/2008, 04/18/2009, 04/04/2010, 04/10/2011, 04/02/2012   Influenza Whole 05/07/2008, 04/18/2009, 04/04/2010   Influenza, High  Dose Seasonal PF 03/25/2016, 04/24/2016, 04/05/2017, 04/29/2018, 04/12/2021   Influenza, Seasonal, Injecte, Preservative Fre 04/02/2012   Influenza,inj,Quad PF,6+ Mos 03/20/2013, 04/09/2014, 04/18/2015   Influenza-Unspecified 03/25/2008, 03/25/2009, 03/26/2011, 04/10/2011, 04/02/2012, 03/20/2013, 04/09/2014, 04/18/2015, 04/24/2016, 04/05/2017, 04/05/2020, 04/12/2021, 05/01/2022   Moderna Sars-Covid-2 Vaccination 06/30/2019, 07/06/2019, 07/07/2019, 08/05/2019, 05/31/2020   PPD Test 04/29/2018   Pneumococcal Conjugate-13 05/15/2013   Pneumococcal Polysaccharide-23 07/23/2007, 04/26/2017   Pneumococcal-Unspecified 07/26/2006, 07/23/2007, 05/15/2013, 04/26/2017   Td 05/09/2009   Td (Adult),5 Lf Tetanus Toxid,  Preservative Free 05/09/2009   Tdap 12/02/2017    Screening Tests Health Maintenance  Topic Date Due   Zoster Vaccines- Shingrix (1 of 2) Never done   COVID-19 Vaccine (6 - 2024-25 season) 02/24/2023   Medicare Annual Wellness (AWV)  06/29/2023   INFLUENZA VACCINE  01/24/2024   DTaP/Tdap/Td (4 - Td or Tdap) 12/03/2027   Pneumococcal Vaccine: 50+ Years  Completed   HPV VACCINES  Aged Out   Meningococcal B Vaccine  Aged Out   Lung Cancer Screening  Discontinued   Hepatitis C Screening  Discontinued    Health Maintenance  Health Maintenance Due  Topic Date Due   Zoster Vaccines- Shingrix (1 of 2) Never done   COVID-19 Vaccine (6 - 2024-25 season) 02/24/2023   Medicare Annual Wellness (AWV)  06/29/2023   Health Maintenance Items Addressed: See Nurse Notes at the end of this note  Additional Screening:  Vision Screening: Recommended annual ophthalmology exams for early detection of glaucoma and other disorders of the eye. Would you like a referral to an eye doctor? No    Dental Screening: Recommended annual dental exams for proper oral hygiene  Community Resource Referral / Chronic Care Management: CRR required this visit?  No   CCM required this visit?  No   Plan:    I have personally reviewed and noted the following in the patient's chart:   Medical and social history Use of alcohol, tobacco or illicit drugs  Current medications and supplements including opioid prescriptions. Patient is not currently taking opioid prescriptions. Functional ability and status Nutritional status Physical activity Advanced directives List of other physicians Hospitalizations, surgeries, and ER visits in previous 12 months Vitals Screenings to include cognitive, depression, and falls Referrals and appointments  In addition, I have reviewed and discussed with patient certain preventive protocols, quality metrics, and best practice recommendations. A written personalized care plan  for preventive services as well as general preventive health recommendations were provided to patient.   Greenly Rarick L Eula Mazzola, CMA   12/11/2023   After Visit Summary: (MyChart) Due to this being a telephonic visit, the after visit summary with patients personalized plan was offered to patient via MyChart   Notes: Patient is due for a Shingrix vaccine.  He had no concerns to address today.   Medical screening examination/treatment/procedure(s) were performed by non-physician practitioner and as supervising physician I was immediately available for consultation/collaboration.  I agree with above. Karlynn Noel, MD

## 2023-12-23 DIAGNOSIS — H353221 Exudative age-related macular degeneration, left eye, with active choroidal neovascularization: Secondary | ICD-10-CM | POA: Diagnosis not present

## 2024-01-07 ENCOUNTER — Other Ambulatory Visit

## 2024-01-07 DIAGNOSIS — I2583 Coronary atherosclerosis due to lipid rich plaque: Secondary | ICD-10-CM | POA: Diagnosis not present

## 2024-01-07 DIAGNOSIS — E785 Hyperlipidemia, unspecified: Secondary | ICD-10-CM | POA: Diagnosis not present

## 2024-01-07 DIAGNOSIS — E034 Atrophy of thyroid (acquired): Secondary | ICD-10-CM | POA: Diagnosis not present

## 2024-01-07 DIAGNOSIS — R2689 Other abnormalities of gait and mobility: Secondary | ICD-10-CM | POA: Diagnosis not present

## 2024-01-07 LAB — COMPREHENSIVE METABOLIC PANEL WITH GFR
ALT: 24 U/L (ref 0–53)
AST: 22 U/L (ref 0–37)
Albumin: 4.2 g/dL (ref 3.5–5.2)
Alkaline Phosphatase: 74 U/L (ref 39–117)
BUN: 27 mg/dL — ABNORMAL HIGH (ref 6–23)
CO2: 28 meq/L (ref 19–32)
Calcium: 9.6 mg/dL (ref 8.4–10.5)
Chloride: 105 meq/L (ref 96–112)
Creatinine, Ser: 1.16 mg/dL (ref 0.40–1.50)
GFR: 58.89 mL/min — ABNORMAL LOW (ref >60.00)
Glucose, Bld: 96 mg/dL (ref 70–99)
Potassium: 4 meq/L (ref 3.5–5.1)
Sodium: 138 meq/L (ref 135–145)
Total Bilirubin: 0.7 mg/dL (ref 0.2–1.2)
Total Protein: 6.8 g/dL (ref 6.0–8.3)

## 2024-01-07 LAB — LIPID PANEL
Cholesterol: 112 mg/dL (ref 0–200)
HDL: 53.7 mg/dL (ref >39.00)
LDL Cholesterol: 48 mg/dL (ref 0–99)
NonHDL: 58.42
Total CHOL/HDL Ratio: 2
Triglycerides: 54 mg/dL (ref 0.0–149.0)
VLDL: 10.8 mg/dL (ref 0.0–40.0)

## 2024-01-07 LAB — T3, FREE: T3, Free: 2.5 pg/mL (ref 2.3–4.2)

## 2024-01-07 LAB — TSH: TSH: 7.27 u[IU]/mL — ABNORMAL HIGH (ref 0.35–5.50)

## 2024-01-07 LAB — T4, FREE: Free T4: 0.74 ng/dL (ref 0.60–1.60)

## 2024-01-11 ENCOUNTER — Ambulatory Visit: Payer: Self-pay | Admitting: Internal Medicine

## 2024-01-13 ENCOUNTER — Ambulatory Visit (INDEPENDENT_AMBULATORY_CARE_PROVIDER_SITE_OTHER): Admitting: Internal Medicine

## 2024-01-13 ENCOUNTER — Encounter: Payer: Self-pay | Admitting: Internal Medicine

## 2024-01-13 VITALS — BP 112/75 | HR 70 | Temp 97.8°F | Ht 74.0 in | Wt 189.0 lb

## 2024-01-13 DIAGNOSIS — R21 Rash and other nonspecific skin eruption: Secondary | ICD-10-CM | POA: Diagnosis not present

## 2024-01-13 DIAGNOSIS — I2583 Coronary atherosclerosis due to lipid rich plaque: Secondary | ICD-10-CM | POA: Diagnosis not present

## 2024-01-13 DIAGNOSIS — H833X3 Noise effects on inner ear, bilateral: Secondary | ICD-10-CM

## 2024-01-13 DIAGNOSIS — E034 Atrophy of thyroid (acquired): Secondary | ICD-10-CM

## 2024-01-13 DIAGNOSIS — R5382 Chronic fatigue, unspecified: Secondary | ICD-10-CM

## 2024-01-13 MED ORDER — LEVOTHYROXINE SODIUM 25 MCG PO TABS: 25.0000 ug | ORAL_TABLET | Freq: Every day | ORAL | 11 refills | Status: AC

## 2024-01-13 MED ORDER — TRIAMCINOLONE ACETONIDE 0.1 % EX CREA: 1.0000 | TOPICAL_CREAM | Freq: Three times a day (TID) | CUTANEOUS | 1 refills | Status: AC

## 2024-01-13 NOTE — Assessment & Plan Note (Signed)
 I think David Buck has symptoms of hypothyroidism (weight gain, hoarseness, fatigue, dry skin).  Will start on Unithroid  12.5 mcg a day for a few days then 25 mcg a day.  Obtain labs in a couple months

## 2024-01-13 NOTE — Assessment & Plan Note (Addendum)
?  etiology x 12 months S/p skin bx Triamc 0.1 % bid Stop Zinc, Glucosamine

## 2024-01-13 NOTE — Assessment & Plan Note (Signed)
 On rosuvastatin

## 2024-01-13 NOTE — Patient Instructions (Signed)
 Stop Zinc, Glucosamine

## 2024-01-13 NOTE — Assessment & Plan Note (Signed)
 David Buck is not using hearing aids.  He was asked to start using hearing aids daily

## 2024-01-13 NOTE — Progress Notes (Signed)
 Subjective:  Patient ID: David Buck, male    DOB: [birth year]  Age: 83 y.o. MRN: [id number]  CC: Medical Management of Chronic Issues (3 MNTH F/U, pt states he has some dermatitis issues on his back, hoarseness and gradual weight gain... Pt also has concerns about tremors ing t hand that sometimes happens in the rt hand... Discuss supplants for magnesium  and creatinine )   HPI David Buck presents for dermatitis on the back x 6 months Dr Marc - Derm  C/o wt gain, voice hoarseness, rash of 1 year duration.  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  There is no pain or itching.  The rash is not better with treatment so far   Outpatient Medications Prior to Visit  Medication Sig Dispense Refill   ascorbic acid (VITAMIN C) 500 MG tablet Take 500 mg by mouth daily.     buPROPion  (WELLBUTRIN  XL) 150 MG 24 hr tablet TAKE 1 TABLET BY MOUTH DAILY. MUST HAVE APPOINTMENT FOR FURTHER REFILLS 90 tablet 1   Cholecalciferol (VITAMIN D3) 50 MCG (2000 UT) TABS Take 2,000 Units by mouth every evening.     faricimab -svoa (VABYSMO ) 6 MG/0.05ML SOLN intravitreal injection 6 mg by Intravitreal route every 30 (thirty) days. Once every 4 weeks.     gabapentin  (NEURONTIN ) 100 MG capsule Take 100 mg by mouth 3 (three) times daily.     loratadine  (CLARITIN ) 10 MG tablet Take 1 tablet by mouth daily as needed for allergies.     LORazepam  (ATIVAN ) 0.5 MG tablet TAKE ONE TABLET BY MOUTH TWICE A DAY AS NEEDED FOR ANXIETY 60 tablet 3   Multiple Vitamin (MULTIVITAMIN WITH MINERALS) TABS tablet Take 1 tablet by mouth every evening.     olmesartan  (BENICAR ) 20 MG tablet TAKE 1 TABLET BY MOUTH DAILY 90 tablet 3   pantoprazole  (PROTONIX ) 40 MG tablet Take 1 tablet (40 mg total) by mouth daily. 90 tablet 1   Polyethyl Glycol-Propyl Glycol (SYSTANE ULTRA) 0.4-0.3 % SOLN Place 1 drop into both eyes every evening.     prednisoLONE acetate (PRED FORTE) 1 % ophthalmic suspension Place 1 drop  into both eyes.     rosuvastatin  (CRESTOR ) 10 MG tablet Take 1 tablet (10 mg total) by mouth daily. 90 tablet 3   ZINC PICOLINATE PO Take 22 mg by mouth every evening.     tadalafil  (CIALIS ) 20 MG tablet Take 1 tablet (20 mg total) by mouth daily as needed for erectile dysfunction. (Patient not taking: Reported on 01/13/2024) 20 tablet 3   doxycycline  (VIBRAMYCIN ) 100 MG capsule Take 100 mg by mouth daily.     GLUCOSAMINE HCL PO Take 500 mg by mouth daily.     methylPREDNISolone  (MEDROL  DOSEPAK) 4 MG TBPK tablet As directed 21 tablet 0   No facility-administered medications prior to visit.    ROS: Review of Systems  Constitutional:  Positive for fatigue and unexpected weight change. Negative for appetite change.  HENT:  Positive for voice change. Negative for congestion, nosebleeds, sneezing, sore throat and trouble swallowing.   Eyes:  Negative for itching and visual disturbance.  Respiratory:  Negative for cough and shortness of breath.   Cardiovascular:  Negative for chest pain, palpitations and leg swelling.  Gastrointestinal:  Negative for abdominal distention, blood in stool, diarrhea and nausea.  Genitourinary:  Negative for frequency and hematuria.  Musculoskeletal:  Positive for arthralgias. Negative for back pain, gait problem, joint swelling and neck pain.  Skin:  Positive for rash.  Neurological:  Negative for dizziness, tremors, speech difficulty and weakness.  Psychiatric/Behavioral:  Positive for sleep disturbance. Negative for agitation and dysphoric mood. The patient is nervous/anxious.     Objective:  BP 112/75   Pulse 70   Temp 97.8 F (36.6 C) (Oral)   Ht 6' 2 (1.88 m)   Wt 189 lb (85.7 kg)   SpO2 96%   BMI 24.27 kg/m   BP Readings from Last 3 Encounters:  01/13/24 112/75  11/15/23 112/74  10/14/23 (!) 90/56    Wt Readings from Last 3 Encounters:  01/13/24 189 lb (85.7 kg)  12/11/23 185 lb (83.9 kg)  11/15/23 185 lb 6.4 oz (84.1 kg)    Physical  Exam Constitutional:      General: He is not in acute distress.    Appearance: He is well-developed.     Comments: NAD  Eyes:     Conjunctiva/sclera: Conjunctivae normal.     Pupils: Pupils are equal, round, and reactive to light.  Neck:     Thyroid : No thyromegaly.     Vascular: No JVD.  Cardiovascular:     Rate and Rhythm: Normal rate and regular rhythm.     Heart sounds: Normal heart sounds. No murmur heard.    No friction rub. No gallop.  Pulmonary:     Effort: Pulmonary effort is normal. No respiratory distress.     Breath sounds: Normal breath sounds. No wheezing or rales.  Chest:     Chest wall: No tenderness.  Abdominal:     General: Bowel sounds are normal. There is no distension.     Palpations: Abdomen is soft. There is no mass.     Tenderness: There is no abdominal tenderness. There is no guarding or rebound.  Musculoskeletal:        General: No tenderness. Normal range of motion.     Cervical back: Normal range of motion.     Right lower leg: No edema.     Left lower leg: No edema.  Lymphadenopathy:     Cervical: No cervical adenopathy.  Skin:    General: Skin is warm and dry.     Findings: Rash present. No bruising.  Neurological:     Mental Status: He is alert and oriented to person, place, and time.     Cranial Nerves: No cranial nerve deficit.     Motor: No abnormal muscle tone.     Coordination: Coordination normal.     Gait: Gait normal.     Deep Tendon Reflexes: Reflexes are normal and symmetric.  Psychiatric:        Behavior: Behavior normal.        Thought Content: Thought content normal.        Judgment: Judgment normal.   Rash-see picture above  Lab Results  Component Value Date   WBC 6.8 04/30/2023   HGB 13.1 04/30/2023   HCT 40.6 04/30/2023   PLT 295.0 04/30/2023   GLUCOSE 96 01/07/2024   CHOL 112 01/07/2024   TRIG 54.0 01/07/2024   HDL 53.70 01/07/2024   LDLCALC 48 01/07/2024   ALT 24 01/07/2024   AST 22 01/07/2024   NA 138  01/07/2024   K 4.0 01/07/2024   CL 105 01/07/2024   CREATININE 1.16 01/07/2024   BUN 27 (H) 01/07/2024   CO2 28 01/07/2024   TSH 7.27 (H) 01/07/2024   PSA 3.34 04/30/2023   INR 1.0 08/29/2022   HGBA1C 5.8 04/30/2023    CT  Chest Wo Contrast Result Date: 08/22/2023 CLINICAL DATA:  History of biopsy-proven MAI complex within the lungs. Status post wedge resections. Follow-up lung nodules. EXAM: CT CHEST WITHOUT CONTRAST TECHNIQUE: Multidetector CT imaging of the chest was performed following the standard protocol without IV contrast. RADIATION DOSE REDUCTION: This exam was performed according to the departmental dose-optimization program which includes automated exposure control, adjustment of the mA and/or kV according to patient size and/or use of iterative reconstruction technique. COMPARISON:  01/30/2023 FINDINGS: Cardiovascular: Normal heart size. Aortic atherosclerosis and coronary artery calcifications. Mediastinum/Nodes: Thyroid  gland, trachea, and esophagus demonstrate no significant abnormality. No enlarged mediastinal or hilar lymph nodes. Lungs/Pleura: Biapical pleuroparenchymal scarring. Emphysema. Postoperative changes within the right upper and right lower lobes. Scattered areas of mucoid impaction. No pleural effusion or airspace consolidation. Increased tiny peripheral tree-in-bud nodules within the posterior and lateral right lung base, image 154/6. Stable tiny nodule within the periphery of the left apex measuring 3 mm, image 29/6. Upper Abdomen: No acute abnormality. Partially visualized, simple appearing cyst within the left kidney measures 6.9 cm, image 191/3. No specific follow-up imaging recommended. Musculoskeletal: No chest wall mass or suspicious bone lesions identified. IMPRESSION: 1. 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 2. Increased tiny peripheral tree-in-bud  nodules within the posterior and lateral right lung base. Findings are compatible with atypical infection such as MAI. 3. Stable tiny nodule within the periphery of the left apex measuring 3 mm. 4. Coronary artery calcifications. 5. Aortic Atherosclerosis (ICD10-I70.0) and Emphysema (ICD10-J43.9). Electronically Signed   By: Waddell Calk M.D.   On: 08/22/2023 09:10    Assessment & Plan:   Problem List Items Addressed This Visit     Coronary atherosclerosis   On rosuvastatin       Fatigue   I think David Buck has symptoms of hypothyroidism (weight gain, hoarseness, fatigue, dry skin).  Will start on Unithroid  12.5 mcg a day for a few days then 25 mcg a day.  Obtain labs in a couple months      Hearing loss d/t noise   David Buck is not using hearing aids.  He was asked to start using hearing aids daily      Hypothyroidism   I think David Buck has symptoms of hypothyroidism (weight gain, hoarseness, fatigue, dry skin).  Will start on Unithroid  12.5 mcg a day for a few days then 25 mcg a day.  Obtain labs in a couple months      Relevant Medications   levothyroxine  (SYNTHROID ) 25 MCG tablet   Other Relevant Orders   TSH   T4, free   Rash and nonspecific skin eruption - Primary   ?etiology x 12 months S/p skin bx Triamc 0.1 % bid Stop Zinc, Glucosamine         Meds ordered this encounter  Medications   triamcinolone  cream (KENALOG ) 0.1 %    Sig: Apply 1 Application topically 3 (three) times daily.    Dispense:  160 g    Refill:  1   levothyroxine  (SYNTHROID ) 25 MCG tablet    Sig: Take 1 tablet (25 mcg total) by mouth daily.    Dispense:  30 tablet    Refill:  11      Follow-up: Return in about 2 months (around 03/15/2024) for Wellness Exam.  Marolyn Noel, MD

## 2024-01-15 DIAGNOSIS — H43392 Other vitreous opacities, left eye: Secondary | ICD-10-CM | POA: Diagnosis not present

## 2024-01-21 DIAGNOSIS — H353221 Exudative age-related macular degeneration, left eye, with active choroidal neovascularization: Secondary | ICD-10-CM | POA: Diagnosis not present

## 2024-02-19 NOTE — Progress Notes (Signed)
 Subjective:    Patient ID: David Buck, male    DOB: March 06, 1941, 83 y.o.   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 Bronchoscopy (Dr Kassie) 04/14/20- BAL-Neg Fungal, Neg AFB, Pos Pseudomonas broadly sens. Had doxycycline  prior to procedure, then Levaquin  500 mg x 7 days after. Cytology neg. Lung Bx Positibve MAIC 2024 PFT 07/12/22- Resection lung nodule 08/29/22- > +MAIC. 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 Neurology/ [facility] following for OSA (HST 12/21/21 AHI 16.1, desat to 80%> CPAP)  Severe emphysema, stable scarring on cardiac CT in December. Wife and daughter Conservation officer, nature) here today CT chest 08/20/23 IMPRESSION: 1. 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 2. Increased tiny peripheral tree-in-bud nodules within the posterior and lateral right  lung base. Findings are compatible with atypical infection such as MAI. 3. Stable tiny nodule within the periphery of the left apex measuring 3 mm. 4. Coronary artery calcifications. 5. Aortic Atherosclerosis (ICD10-I70.0) and Emphysema (ICD10-J43.9). Discussed the use of AI scribe software for clinical note transcription with the patient, who gave verbal consent to proceed.  History of Present Illness   The patient, with a history of thoracic surgery/ wedge resections which identified MAIC, and COPD/emphysema, presents with shortness of breath. He reports that the shortness of breath began after his thoracic surgery and has persisted since. He also notes that he feels more short of breath when his stomach is full. The patient's pulmonary function test results from a year ago indicate moderate obstructive airways disease and severe emphysema is noted on CT. The patient is currently using Spiriva Respimat, an inhaler, which he reports does not cause any adverse reactions. 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 He denies any cough or night sweats. He gets very little physical exercise.   Assessment and Plan:    COPD/Emphysema Shortness of breath, especially after eating or bending over. Pulmonary function test a year ago showed moderate obstructive airways disease. Recent CT scan shows severe emphysema. Patient has been trialing Spiriva Respimat with some perceived benefit. -Continue Spiriva Respimat as needed, with instructions to use consistently for a few days, then stop for a few days to assess for any noticeable difference in symptoms. -Encourage regular physical activity to maintain endurance and muscle strength.  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 Recent CT scan shows some areas of inflammation in the right lung base, suggesting possible active infection. -Refer to Infectious  Disease specialists at [facility] for evaluation and potential treatment. -Consider long-term multi-drug antibiotic regimen if deemed necessary by Infectious Disease specialist.  Follow-up in 6 months  to reassess symptoms and response to treatment.        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 Neurology/ [facility] following for OSA (HST 12/21/21 AHI 16.1, desat to 80%> CPAP)  Seen by ID- Dr Overton- in March- appreciate thoughtful note: But to prepare in case we have to, I recommend: -repeat pft this year -monitor clinically for function sign of decline, b-sx, chronic pneumonia -repeat chest ct in 9-12 months -potentially repeat pft in a year if clinical decline -also within the next year I would like to see 3 more sputums growing MAC  Discussed the use of AI scribe software for clinical note transcription with the patient, who gave verbal consent to proceed.  History of Present Illness   David Buck is an 83 year old male with emphysema and indolent atypical AFB infection who presents with shortness of breath and hoarseness.  Shortness of breath occurs primarily on exertion, with pulse oximetry readings ranging from 93% to 95%, occasionally reaching 100% during exercise. He has stopped exercising recently. There is no persistent cough or significant sputum production, though occasional greenish sputum is present, likely due to postnasal drip.  Intermittent hoarseness is present, with relief from  loratadine . He has a long history of smoking. Previous use of a Bevespi  inhaler resulted in an adverse reaction, and albuterol  did not significantly impact symptoms during the last pulmonary function test.   Infectious Disease Consultant did not feel treatment for ntm is indicated at this time, but advised f/u CT and PFT.     Assessment and Plan:    Pulmonary mycobacterial infection Chronic infection managed conservatively due to potential treatment ineffectiveness and recurrence risk. Treatment avoided unless necessary. - Order pulmonary function test. - Schedule CT scan for February.  Emphysema Chronic emphysema with exertional dyspnea. No significant albuterol  response, indicating lack of smooth muscle spasm. Oxygen saturation 93-95%. Exercise improves lung efficiency but not tissue regeneration. - Encourage exercise to improve efficiency of lung use.  Intermittent hoarseness Intermittent hoarseness likely related to allergies or reflux. Loratadine  provides relief, suggesting allergy component. - Consider ENT referral if hoarseness persists.     ROS-see HPI  + = positive Constitutional:   No-   weight loss, night sweats, fevers, chills, fatigue, lassitude. HEENT:   No-  headaches, difficulty swallowing, tooth/dental problems, sore throat,       No-  sneezing, itching, ear ache, +nasal congestion, +post nasal drip,  CV:  No-   chest pain, orthopnea, PND, swelling in lower extremities, anasarca, dizziness, palpitations Resp: +  shortness of breath with exertion or at rest.             productive cough,  No- non-productive cough,  coughing up of blood.              No-   change in color of mucus.  No- wheezing.   Skin: No-   rash or lesions. GI:  No-   heartburn, indigestion, abdominal pain, nausea, vomiting,  GU:  MS:  No-   joint pain or swelling.  Neuro-     nothing  unusual Psych:  No- change in mood or affect. No depression or anxiety.  No memory loss.  Objective:   Physical  Exam General- Alert, Oriented, Affect-appropriate, Distress- none acute.+ tall slender man Skin- rash-none, lesions- none, excoriation- none Lymphadenopathy- none Head- atraumatic            Eyes-             Ears- Hearing, canals normal            Nose- Clear, No- Septal dev, +mucus postnasal drip, polyps, erosion, perforation             Throat- Mallampati II , mucosa clear , drainage- none, tonsils- atrophic  dentures Neck- flexible , trachea midline, no stridor , thyroid  nl, carotid no bruit Chest - symmetrical excursion , unlabored           Heart/CV- RRR , no murmur , no gallop  , no rub, nl s1 s2                           - JVD- none , edema- none, stasis changes- none, varices- none           Lung- Clear, unlabored, wheeze- none, cough-none, dullness-none, rub- none           Chest wall-  Abd-  Br/ Gen/ Rectal- Not done, not indicated Extrem- cyanosis- none, clubbing, none, atrophy- none, strength- nl Neuro-+ mild head bobbing tremor

## 2024-02-20 ENCOUNTER — Ambulatory Visit: Payer: Medicare Other | Admitting: Internal Medicine

## 2024-02-20 ENCOUNTER — Encounter: Payer: Self-pay | Admitting: Internal Medicine

## 2024-02-20 VITALS — BP 98/64 | HR 84 | Temp 97.7°F | Ht 74.0 in | Wt 187.2 lb

## 2024-02-20 DIAGNOSIS — A319 Mycobacterial infection, unspecified: Secondary | ICD-10-CM

## 2024-02-20 DIAGNOSIS — J439 Emphysema, unspecified: Secondary | ICD-10-CM | POA: Diagnosis not present

## 2024-02-20 DIAGNOSIS — R490 Dysphonia: Secondary | ICD-10-CM

## 2024-02-20 DIAGNOSIS — J479 Bronchiectasis, uncomplicated: Secondary | ICD-10-CM

## 2024-02-20 NOTE — Patient Instructions (Signed)
 Order- schedule PFT    dx bronchiectasis, uncomplicated  Order- schedule CT chest, no contrast, in 6 months   dx bronchiectasis, atypical AFB

## 2024-02-24 ENCOUNTER — Encounter: Payer: Self-pay | Admitting: Internal Medicine

## 2024-02-25 DIAGNOSIS — H353221 Exudative age-related macular degeneration, left eye, with active choroidal neovascularization: Secondary | ICD-10-CM | POA: Diagnosis not present

## 2024-03-09 ENCOUNTER — Other Ambulatory Visit: Payer: Self-pay | Admitting: Internal Medicine

## 2024-03-10 ENCOUNTER — Other Ambulatory Visit (INDEPENDENT_AMBULATORY_CARE_PROVIDER_SITE_OTHER)

## 2024-03-10 ENCOUNTER — Ambulatory Visit: Payer: Self-pay | Admitting: Internal Medicine

## 2024-03-10 DIAGNOSIS — E034 Atrophy of thyroid (acquired): Secondary | ICD-10-CM

## 2024-03-10 LAB — TSH: TSH: 3.42 u[IU]/mL (ref 0.35–5.50)

## 2024-03-10 LAB — T4, FREE: Free T4: 1.03 ng/dL (ref 0.60–1.60)

## 2024-03-16 ENCOUNTER — Ambulatory Visit (INDEPENDENT_AMBULATORY_CARE_PROVIDER_SITE_OTHER): Admitting: Internal Medicine

## 2024-03-16 ENCOUNTER — Encounter: Payer: Self-pay | Admitting: Internal Medicine

## 2024-03-16 VITALS — BP 130/96 | HR 70 | Temp 98.4°F | Ht 74.0 in | Wt 185.8 lb

## 2024-03-16 DIAGNOSIS — R490 Dysphonia: Secondary | ICD-10-CM | POA: Diagnosis not present

## 2024-03-16 DIAGNOSIS — N320 Bladder-neck obstruction: Secondary | ICD-10-CM

## 2024-03-16 DIAGNOSIS — R972 Elevated prostate specific antigen [PSA]: Secondary | ICD-10-CM

## 2024-03-16 DIAGNOSIS — R1013 Epigastric pain: Secondary | ICD-10-CM | POA: Diagnosis not present

## 2024-03-16 NOTE — Assessment & Plan Note (Signed)
 David Buck declined meds He will try saw palmetto

## 2024-03-16 NOTE — Assessment & Plan Note (Signed)
 Last PSA 10 months ago Monitoring PSA F/u w/Urology

## 2024-03-16 NOTE — Assessment & Plan Note (Signed)
 New ENT refref

## 2024-03-16 NOTE — Progress Notes (Signed)
 Subjective:  Patient ID: David Buck, male    DOB: [birth year]  Age: 83 y.o. MRN: [id number]  CC: Follow-up (Patient has few things to discuss with you. )   HPI David Buck presents for thyroid  tests, anxiety, itching, BPH sx's C/o upper abd discomfort, bloating - chronic C/o being hoarse  Outpatient Medications Prior to Visit  Medication Sig Dispense Refill   ascorbic acid (VITAMIN C) 500 MG tablet Take 500 mg by mouth daily.     buPROPion  (WELLBUTRIN  XL) 150 MG 24 hr tablet TAKE 1 TABLET BY MOUTH DAILY. MUST HAVE APPOINTMENT FOR FURTHER REFILLS 90 tablet 1   Cholecalciferol (VITAMIN D3) 50 MCG (2000 UT) TABS Take 2,000 Units by mouth every evening.     faricimab -svoa (VABYSMO ) 6 MG/0.05ML SOLN intravitreal injection 6 mg by Intravitreal route every 30 (thirty) days. Once every 4 weeks.     levothyroxine  (SYNTHROID ) 25 MCG tablet Take 1 tablet (25 mcg total) by mouth daily. 30 tablet 11   loratadine  (CLARITIN ) 10 MG tablet Take 1 tablet by mouth daily as needed for allergies.     LORazepam  (ATIVAN ) 0.5 MG tablet TAKE ONE TABLET BY MOUTH TWICE A DAY AS NEEDED FOR ANXIETY 60 tablet 1   Multiple Vitamin (MULTIVITAMIN WITH MINERALS) TABS tablet Take 1 tablet by mouth every evening.     olmesartan  (BENICAR ) 20 MG tablet TAKE 1 TABLET BY MOUTH DAILY 90 tablet 3   Polyethyl Glycol-Propyl Glycol (SYSTANE ULTRA) 0.4-0.3 % SOLN Place 1 drop into both eyes every evening.     rosuvastatin  (CRESTOR ) 10 MG tablet Take 1 tablet (10 mg total) by mouth daily. 90 tablet 3   triamcinolone  cream (KENALOG ) 0.1 % Apply 1 Application topically 3 (three) times daily. 160 g 1   ZINC PICOLINATE PO Take 22 mg by mouth every evening.     prednisoLONE acetate (PRED FORTE) 1 % ophthalmic suspension Place 1 drop into both eyes. (Patient not taking: Reported on 03/16/2024)     tadalafil  (CIALIS ) 20 MG tablet Take 1 tablet (20 mg total) by mouth daily as needed for erectile dysfunction. (Patient not taking:  Reported on 03/16/2024) 20 tablet 3   No facility-administered medications prior to visit.    ROS: Review of Systems  Constitutional:  Negative for appetite change, fatigue and unexpected weight change.  HENT:  Negative for congestion, nosebleeds, sneezing, sore throat and trouble swallowing.   Eyes:  Negative for itching and visual disturbance.  Respiratory:  Negative for cough.   Cardiovascular:  Negative for chest pain, palpitations and leg swelling.  Gastrointestinal:  Positive for abdominal distention and abdominal pain. Negative for blood in stool, diarrhea and nausea.  Genitourinary:  Positive for frequency. Negative for hematuria.  Musculoskeletal:  Negative for back pain, gait problem, joint swelling and neck pain.  Skin:  Negative for rash.  Neurological:  Negative for dizziness, tremors, speech difficulty and weakness.  Psychiatric/Behavioral:  Negative for agitation, dysphoric mood and sleep disturbance. The patient is not nervous/anxious.     Objective:  BP (!) 130/96   Pulse 70   Temp 98.4 F (36.9 C) (Oral)   Ht 6' 2 (1.88 m)   Wt 185 lb 12.8 oz (84.3 kg)   SpO2 95%   BMI 23.86 kg/m   BP Readings from Last 3 Encounters:  03/16/24 (!) 130/96  02/20/24 98/64  01/13/24 112/75    Wt Readings from Last 3 Encounters:  03/16/24 185 lb 12.8 oz (84.3 kg)  02/20/24 187 lb 3.2  oz (84.9 kg)  01/13/24 189 lb (85.7 kg)    Physical Exam Constitutional:      General: He is not in acute distress.    Appearance: Normal appearance. He is well-developed.     Comments: NAD  Eyes:     Conjunctiva/sclera: Conjunctivae normal.     Pupils: Pupils are equal, round, and reactive to light.  Neck:     Thyroid : No thyromegaly.     Vascular: No JVD.  Cardiovascular:     Rate and Rhythm: Normal rate and regular rhythm.     Heart sounds: Normal heart sounds. No murmur heard.    No friction rub. No gallop.  Pulmonary:     Effort: Pulmonary effort is normal. No respiratory  distress.     Breath sounds: Normal breath sounds. No wheezing or rales.  Chest:     Chest wall: No tenderness.  Abdominal:     General: Bowel sounds are normal. There is no distension.     Palpations: Abdomen is soft. There is no mass.     Tenderness: There is no abdominal tenderness. There is no guarding or rebound.  Musculoskeletal:        General: No tenderness. Normal range of motion.     Cervical back: Normal range of motion.  Lymphadenopathy:     Cervical: No cervical adenopathy.  Skin:    General: Skin is warm and dry.     Findings: No rash.  Neurological:     Mental Status: He is alert and oriented to person, place, and time.     Cranial Nerves: No cranial nerve deficit.     Motor: No abnormal muscle tone.     Coordination: Coordination normal.     Gait: Gait normal.     Deep Tendon Reflexes: Reflexes are normal and symmetric.  Psychiatric:        Behavior: Behavior normal.        Thought Content: Thought content normal.        Judgment: Judgment normal.     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 Follow-up lung nodules. EXAM: CT CHEST WITHOUT CONTRAST TECHNIQUE: Multidetector CT imaging of the chest was performed following the standard protocol without IV contrast. RADIATION DOSE REDUCTION: This exam was performed according to the departmental  dose-optimization program which includes automated exposure control, adjustment of the mA and/or kV according to patient size and/or use of iterative reconstruction technique. COMPARISON:  01/30/2023 FINDINGS: Cardiovascular: Normal heart size. Aortic atherosclerosis and coronary artery calcifications. Mediastinum/Nodes: Thyroid  gland, trachea, and esophagus demonstrate no significant abnormality. No enlarged mediastinal or hilar lymph nodes. Lungs/Pleura: Biapical pleuroparenchymal scarring. Emphysema. Postoperative changes within the right upper and right lower lobes. Scattered areas of mucoid impaction. No pleural effusion  or airspace consolidation. Increased tiny peripheral tree-in-bud nodules within the posterior and lateral right lung base, image 154/6. Stable tiny nodule within the periphery of the left apex measuring 3 mm, image 29/6. Upper Abdomen: No acute abnormality. Partially visualized, simple appearing cyst within the left kidney measures 6.9 cm, image 191/3. No specific follow-up imaging recommended. Musculoskeletal: No chest wall mass or suspicious bone lesions identified. IMPRESSION: 1. 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 2. Increased tiny peripheral tree-in-bud nodules within the posterior and lateral right lung base. Findings are compatible with atypical infection such as MAI. 3. Stable tiny nodule within the periphery of the left apex measuring 3 mm. 4. Coronary artery calcifications. 5. Aortic Atherosclerosis (ICD10-I70.0) and Emphysema (ICD10-J43.9). Electronically Signed   By: Waddell Calk M.D.   On: 08/22/2023 09:10    Assessment & Plan:   Problem List Items Addressed This Visit     Bladder neck obstruction   Jerel declined meds He will try saw palmetto      Elevated PSA   Last PSA 10 months ago Monitoring PSA F/u w/Urology      Hoarseness of voice - Primary    New ENT refref      Relevant Orders   Ambulatory referral to ENT   Other Visit Diagnoses       Epigastric pain       Relevant Orders   US  Abdomen Complete         No orders of the defined types were placed in this encounter.     Follow-up: Return in about 3 months (around 06/15/2024) for a follow-up visit.  Marolyn Noel, MD

## 2024-03-24 DIAGNOSIS — H353221 Exudative age-related macular degeneration, left eye, with active choroidal neovascularization: Secondary | ICD-10-CM | POA: Diagnosis not present

## 2024-03-25 DIAGNOSIS — J3089 Other allergic rhinitis: Secondary | ICD-10-CM | POA: Diagnosis not present

## 2024-03-25 DIAGNOSIS — K219 Gastro-esophageal reflux disease without esophagitis: Secondary | ICD-10-CM | POA: Diagnosis not present

## 2024-03-25 DIAGNOSIS — J302 Other seasonal allergic rhinitis: Secondary | ICD-10-CM | POA: Diagnosis not present

## 2024-03-25 DIAGNOSIS — J387 Other diseases of larynx: Secondary | ICD-10-CM | POA: Diagnosis not present

## 2024-03-25 DIAGNOSIS — R490 Dysphonia: Secondary | ICD-10-CM | POA: Diagnosis not present

## 2024-03-27 ENCOUNTER — Ambulatory Visit
Admission: RE | Admit: 2024-03-27 | Discharge: 2024-03-27 | Disposition: A | Discharge status: Home / Self Care | Source: Ambulatory Visit | Attending: Internal Medicine | Admitting: Internal Medicine

## 2024-03-27 DIAGNOSIS — R109 Unspecified abdominal pain: Secondary | ICD-10-CM | POA: Diagnosis not present

## 2024-03-29 ENCOUNTER — Ambulatory Visit: Payer: Self-pay | Admitting: Internal Medicine

## 2024-04-08 ENCOUNTER — Ambulatory Visit (INDEPENDENT_AMBULATORY_CARE_PROVIDER_SITE_OTHER)

## 2024-04-08 ENCOUNTER — Ambulatory Visit: Admitting: Internal Medicine

## 2024-04-08 DIAGNOSIS — Z23 Encounter for immunization: Secondary | ICD-10-CM | POA: Diagnosis not present

## 2024-04-27 DIAGNOSIS — Z961 Presence of intraocular lens: Secondary | ICD-10-CM | POA: Diagnosis not present

## 2024-04-27 DIAGNOSIS — H35373 Puckering of macula, bilateral: Secondary | ICD-10-CM | POA: Diagnosis not present

## 2024-04-27 DIAGNOSIS — H4421 Degenerative myopia, right eye: Secondary | ICD-10-CM | POA: Diagnosis not present

## 2024-04-27 DIAGNOSIS — H353221 Exudative age-related macular degeneration, left eye, with active choroidal neovascularization: Secondary | ICD-10-CM | POA: Diagnosis not present

## 2024-04-27 DIAGNOSIS — H43813 Vitreous degeneration, bilateral: Secondary | ICD-10-CM | POA: Diagnosis not present

## 2024-05-25 DIAGNOSIS — H353221 Exudative age-related macular degeneration, left eye, with active choroidal neovascularization: Secondary | ICD-10-CM | POA: Diagnosis not present

## 2024-05-25 LAB — OPHTHALMOLOGY REPORT-SCANNED

## 2024-05-28 ENCOUNTER — Other Ambulatory Visit: Payer: Self-pay | Admitting: Internal Medicine

## 2024-06-01 ENCOUNTER — Ambulatory Visit: Admitting: Internal Medicine

## 2024-06-07 ENCOUNTER — Other Ambulatory Visit (HOSPITAL_BASED_OUTPATIENT_CLINIC_OR_DEPARTMENT_OTHER): Payer: Self-pay | Admitting: Family

## 2024-06-08 DIAGNOSIS — Z961 Presence of intraocular lens: Secondary | ICD-10-CM | POA: Diagnosis not present

## 2024-06-08 DIAGNOSIS — H524 Presbyopia: Secondary | ICD-10-CM | POA: Diagnosis not present

## 2024-06-12 ENCOUNTER — Other Ambulatory Visit: Payer: Self-pay | Admitting: Internal Medicine

## 2024-06-22 ENCOUNTER — Ambulatory Visit: Admitting: Internal Medicine

## 2024-07-02 ENCOUNTER — Ambulatory Visit: Admitting: Internal Medicine

## 2024-07-10 ENCOUNTER — Other Ambulatory Visit: Payer: Self-pay

## 2024-07-10 MED ORDER — ROSUVASTATIN CALCIUM 10 MG PO TABS: 10.0000 mg | ORAL_TABLET | Freq: Every day | ORAL | 0 refills | Status: DC

## 2024-07-10 NOTE — Telephone Encounter (Signed)
 Lipid anel done 01/07/24

## 2024-07-16 ENCOUNTER — Encounter: Payer: Self-pay | Admitting: Internal Medicine

## 2024-07-16 ENCOUNTER — Ambulatory Visit: Admitting: Internal Medicine

## 2024-07-16 VITALS — BP 98/62 | HR 88 | Ht 74.0 in | Wt 182.6 lb

## 2024-07-16 DIAGNOSIS — R918 Other nonspecific abnormal finding of lung field: Secondary | ICD-10-CM

## 2024-07-16 DIAGNOSIS — E034 Atrophy of thyroid (acquired): Secondary | ICD-10-CM | POA: Diagnosis not present

## 2024-07-16 DIAGNOSIS — F4320 Adjustment disorder, unspecified: Secondary | ICD-10-CM

## 2024-07-16 DIAGNOSIS — H6121 Impacted cerumen, right ear: Secondary | ICD-10-CM | POA: Diagnosis not present

## 2024-07-16 NOTE — Progress Notes (Signed)
 "  Subjective:  Patient ID: David Buck, male    DOB: 24-May-1941  Age: 84 y.o. MRN: [id number]  CC: Medical Management of Chronic Issues (3 Month follow up)   HPI David Buck presents for MIC, hypothyroidism C/o having a pot belly, it used to be flat.  F/u on chest CT Dog died yesterday -David Buck is very upset Complaining of slight right ear discomfort, no pain He is here with his wife [facility] Outpatient Medications Prior to Visit  Medication Sig Dispense Refill   ascorbic acid (VITAMIN C) 500 MG tablet Take 500 mg by mouth daily.     buPROPion  (WELLBUTRIN  XL) 150 MG 24 hr tablet TAKE 1 TABLET BY MOUTH DAILY 90 tablet 1   Cholecalciferol (VITAMIN D3) 50 MCG (2000 UT) TABS Take 2,000 Units by mouth every evening.     faricimab -svoa (VABYSMO ) 6 MG/0.05ML SOLN intravitreal injection 6 mg by Intravitreal route every 30 (thirty) days. Once every 4 weeks.     levothyroxine  (SYNTHROID ) 25 MCG tablet Take 1 tablet (25 mcg total) by mouth daily. 30 tablet 11   loratadine  (CLARITIN ) 10 MG tablet Take 1 tablet by mouth daily as needed for allergies.     LORazepam  (ATIVAN ) 0.5 MG tablet TAKE 1 TABLET BY MOUTH 2 TIMES A DAY AS NEEDED FOR ANXIETY 60 tablet 0   Multiple Vitamin (MULTIVITAMIN WITH MINERALS) TABS tablet Take 1 tablet by mouth every evening.     olmesartan  (BENICAR ) 20 MG tablet TAKE 1 TABLET BY MOUTH DAILY 90 tablet 3   Polyethyl Glycol-Propyl Glycol (SYSTANE ULTRA) 0.4-0.3 % SOLN Place 1 drop into both eyes every evening.     rosuvastatin  (CRESTOR ) 10 MG tablet Take 1 tablet (10 mg total) by mouth daily. 15 tablet 0   triamcinolone  cream (KENALOG ) 0.1 % Apply 1 Application topically 3 (three) times daily. 160 g 1   ZINC PICOLINATE PO Take 22 mg by mouth every evening.     tadalafil  (CIALIS ) 20 MG tablet Take 1 tablet (20 mg total) by mouth daily as needed for erectile dysfunction. (Patient not taking: Reported on 07/16/2024) 20 tablet 3   prednisoLONE acetate (PRED FORTE) 1 %  ophthalmic suspension Place 1 drop into both eyes. (Patient not taking: Reported on 03/16/2024)     No facility-administered medications prior to visit.    ROS: Review of Systems  Constitutional:  Negative for appetite change, fatigue and unexpected weight change.  HENT:  Negative for congestion, nosebleeds, sneezing, sore throat and trouble swallowing.   Eyes:  Negative for itching and visual disturbance.  Respiratory:  Negative for cough.   Cardiovascular:  Negative for chest pain, palpitations and leg swelling.  Gastrointestinal:  Negative for abdominal distention, blood in stool, diarrhea and nausea.  Genitourinary:  Negative for frequency and hematuria.  Musculoskeletal:  Negative for back pain, gait problem, joint swelling and neck pain.  Skin:  Negative for rash.  Neurological:  Negative for dizziness, tremors, speech difficulty and weakness.  Psychiatric/Behavioral:  Negative for agitation, dysphoric mood and sleep disturbance. The patient is not nervous/anxious.     Objective:  BP 98/62   Pulse 88   Ht 6' 2 (1.88 m)   Wt 182 lb 9.6 oz (82.8 kg)   SpO2 95%   BMI 23.44 kg/m   BP Readings from Last 3 Encounters:  07/16/24 98/62  03/16/24 (!) 130/96  02/20/24 98/64    Wt Readings from Last 3 Encounters:  07/16/24 182 lb 9.6 oz (82.8 kg)  03/16/24 185  lb 12.8 oz (84.3 kg)  02/20/24 187 lb 3.2 oz (84.9 kg)    Physical Exam Constitutional:      General: He is not in acute distress.    Appearance: He is well-developed.     Comments: NAD  Eyes:     Conjunctiva/sclera: Conjunctivae normal.     Pupils: Pupils are equal, round, and reactive to light.  Neck:     Thyroid : No thyromegaly.     Vascular: No JVD.  Cardiovascular:     Rate and Rhythm: Normal rate and regular rhythm.     Heart sounds: Normal heart sounds. No murmur heard.    No friction rub. No gallop.  Pulmonary:     Effort: Pulmonary effort is normal. No respiratory distress.     Breath sounds: Normal  breath sounds. No wheezing or rales.  Chest:     Chest wall: No tenderness.  Abdominal:     General: Bowel sounds are normal. There is no distension.     Palpations: Abdomen is soft. There is no mass.     Tenderness: There is no abdominal tenderness. There is no guarding or rebound.  Musculoskeletal:        General: No tenderness. Normal range of motion.     Cervical back: Normal range of motion.  Lymphadenopathy:     Cervical: No cervical adenopathy.  Skin:    General: Skin is warm and dry.     Findings: No rash.  Neurological:     Mental Status: He is alert and oriented to person, place, and time.     Cranial Nerves: No cranial nerve deficit.     Motor: No abnormal muscle tone.     Coordination: Coordination normal.     Gait: Gait normal.     Deep Tendon Reflexes: Reflexes are normal and symmetric.  Psychiatric:        Behavior: Behavior normal.        Thought Content: Thought content normal.        Judgment: Judgment normal.   Wax in the R ear Abdomen is fairly flat, normal for age.  No mass.  No organomegaly Lab Results  Component Value Date   WBC 6.8 04/30/2023   HGB 13.1 04/30/2023   HCT 40.6 04/30/2023   PLT 295.0 04/30/2023   GLUCOSE 96 01/07/2024   CHOL 112 01/07/2024   TRIG 54.0 01/07/2024   HDL 53.70 01/07/2024   LDLCALC 48 01/07/2024   ALT 24 01/07/2024   AST 22 01/07/2024   NA 138 01/07/2024   K 4.0 01/07/2024   CL 105 01/07/2024   CREATININE 1.16 01/07/2024   BUN 27 (H) 01/07/2024   CO2 28 01/07/2024   TSH 3.42 03/10/2024   PSA 3.34 04/30/2023   INR 1.0 08/29/2022   HGBA1C 5.8 04/30/2023    US  Abdomen Complete Result Date: 03/29/2024 CLINICAL DATA:  abd pain, bloating. Thx EXAM: ABDOMEN ULTRASOUND COMPLETE COMPARISON:  August 21, 2022 FINDINGS: Gallbladder: No gallstones or wall thickening visualized. No sonographic Murphy sign noted by sonographer. Common bile duct: Diameter: Visualized portion measures 2 mm, within normal limits. Majority is  not visualized. Liver: No focal lesion identified. Heterogeneous and coarsened in parenchymal echogenicity. Portal vein is patent on color Doppler imaging with normal direction of blood flow towards the liver. IVC: No abnormality visualized. Pancreas: Visualized portion unremarkable. Spleen: Size and appearance within normal limits. Right Kidney: Length: 11.2 cm. Echogenicity within normal limits. No hydronephrosis visualized. There is a near anechoic mass of the  posterior RIGHT kidney measuring up to 1.7 cm. This was previously characterized as a benign cyst on prior CT and is suboptimally assessed on today's exam. Portions are suboptimally assessed secondary to shadowing bowel gas. Left Kidney: Length: 11.0 cm. Echogenicity within normal limits. No hydronephrosis visualized. Benign cysts are noted measuring up to 7.6 cm (for which no dedicated imaging follow-up is recommended). Portions are suboptimally assessed secondary to shadowing bowel gas. Abdominal aorta: No aneurysm visualized. Other findings: None. IMPRESSION: 1. No sonographic etiology for abdominal pain identified. If persistent clinical concern, recommend dedicated cross-sectional imaging. 2. Heterogeneous and coarsened hepatic parenchymal echogenicity as can be seen in the setting of hepatic steatosis or underlying hepatocellular disease. Electronically Signed   By: Corean Salter M.D.   On: 03/29/2024 09:49    Assessment & Plan:   Problem List Items Addressed This Visit     Hypothyroidism   I think David Buck has symptoms of hypothyroidism (weight gain, hoarseness, fatigue, dry skin).  Will start on Unithroid  12.5 mcg a day for a few days then 25 mcg a day.  Obtain labs in a couple months      Pulmonary nodules - Primary   Chest CT pending.  Follow-up with pulmonology      Cerumen impaction   Partial in the right ear - he will irrigate at home with the wax removing.      Grief reaction   Dog died yesterday -David Buck is very  upset Discussed         No orders of the defined types were placed in this encounter.     Follow-up: Return in about 4 months (around 11/13/2024) for a follow-up visit.  Marolyn Noel, MD "

## 2024-07-16 NOTE — Patient Instructions (Signed)

## 2024-07-16 NOTE — Assessment & Plan Note (Signed)
 I think David Buck has symptoms of hypothyroidism (weight gain, hoarseness, fatigue, dry skin).  Will start on Unithroid  12.5 mcg a day for a few days then 25 mcg a day.  Obtain labs in a couple months

## 2024-07-19 DIAGNOSIS — H6120 Impacted cerumen, unspecified ear: Secondary | ICD-10-CM | POA: Insufficient documentation

## 2024-07-19 DIAGNOSIS — F4320 Adjustment disorder, unspecified: Secondary | ICD-10-CM | POA: Insufficient documentation

## 2024-07-19 NOTE — Assessment & Plan Note (Signed)
 Partial in the right ear - he will irrigate at home with the wax removing.

## 2024-07-19 NOTE — Assessment & Plan Note (Signed)
 Chest CT pending.  Follow-up with pulmonology

## 2024-07-19 NOTE — Assessment & Plan Note (Signed)
 Dog died yesterday -David Buck is very upset Discussed

## 2024-07-21 ENCOUNTER — Ambulatory Visit: Admitting: Internal Medicine

## 2024-07-22 ENCOUNTER — Telehealth: Payer: Self-pay | Admitting: Cardiovascular Disease

## 2024-07-22 ENCOUNTER — Other Ambulatory Visit: Payer: Self-pay | Admitting: Internal Medicine

## 2024-07-27 ENCOUNTER — Other Ambulatory Visit

## 2024-07-28 ENCOUNTER — Ambulatory Visit: Admitting: Internal Medicine

## 2024-07-28 ENCOUNTER — Other Ambulatory Visit: Payer: Self-pay

## 2024-07-28 ENCOUNTER — Encounter: Payer: Self-pay | Admitting: Internal Medicine

## 2024-07-28 VITALS — BP 118/79 | HR 72 | Temp 97.4°F | Ht 74.0 in | Wt 185.0 lb

## 2024-07-28 DIAGNOSIS — A310 Pulmonary mycobacterial infection: Secondary | ICD-10-CM | POA: Diagnosis present

## 2024-07-28 NOTE — Patient Instructions (Signed)
 You seem to be at baseline  Please make sure you are in a moderately healthy activity routine and keep a mental note of where your baseline is. So we can track this   Let me know when you have done your pft/chest ct   Otherwise see me 05/2025

## 2024-07-30 MED ORDER — ROSUVASTATIN CALCIUM 10 MG PO TABS: 10.0000 mg | ORAL_TABLET | Freq: Every day | ORAL | 0 refills | Status: AC

## 2024-07-30 NOTE — Telephone Encounter (Signed)
" °*  STAT* If patient is at the pharmacy, call can be transferred to refill team.   1. Which medications need to be refilled? (please list name of each medication and dose if known)   rosuvastatin  (CRESTOR ) 10 MG tablet   2. Would you like to learn more about the convenience, safety, & potential cost savings by using the [facility]?   3. Are you open to using the [facility] (Type [facility]. ).  4. Which pharmacy/location (including street and city if local pharmacy) is medication to be sent to?  [facility] [id number] - [place], [place] - [address]   5. Do they need a 30 day or 90 day supply?     Patient stated he still has medication left.  Patient has appointment with [name], FNP on 5/15. "

## 2024-07-30 NOTE — Telephone Encounter (Signed)
 Refill sent in

## 2024-07-30 NOTE — Addendum Note (Signed)
 Addended by: DRENA MARTINIS, Evanna Washinton L on: 07/30/2024 04:45 PM   Modules accepted: Orders

## 2024-08-10 ENCOUNTER — Other Ambulatory Visit

## 2024-09-23 ENCOUNTER — Ambulatory Visit: Admitting: Emergency Medicine

## 2024-09-23 ENCOUNTER — Encounter

## 2024-11-06 ENCOUNTER — Ambulatory Visit (HOSPITAL_BASED_OUTPATIENT_CLINIC_OR_DEPARTMENT_OTHER): Admitting: Family

## 2024-11-17 ENCOUNTER — Ambulatory Visit: Admitting: Internal Medicine

## 2024-12-11 ENCOUNTER — Ambulatory Visit

## 2025-05-27 ENCOUNTER — Ambulatory Visit: Payer: Self-pay | Admitting: Internal Medicine
